# Patient Record
Sex: Female | Born: 1935 | Race: White | Hispanic: No | Marital: Single | Smoking: Former smoker
Health system: Southern US, Community
[De-identification: ages and names within clinical notes are randomized; demographics above are authoritative.]

## PROBLEM LIST (undated history)

## (undated) DIAGNOSIS — E7800 Pure hypercholesterolemia, unspecified: Secondary | ICD-10-CM

## (undated) DIAGNOSIS — I499 Cardiac arrhythmia, unspecified: Secondary | ICD-10-CM

## (undated) DIAGNOSIS — N189 Chronic kidney disease, unspecified: Secondary | ICD-10-CM

## (undated) DIAGNOSIS — M1990 Unspecified osteoarthritis, unspecified site: Secondary | ICD-10-CM

## (undated) DIAGNOSIS — I00 Rheumatic fever without heart involvement: Secondary | ICD-10-CM

## (undated) DIAGNOSIS — J449 Chronic obstructive pulmonary disease, unspecified: Secondary | ICD-10-CM

## (undated) DIAGNOSIS — D649 Anemia, unspecified: Secondary | ICD-10-CM

## (undated) DIAGNOSIS — I10 Essential (primary) hypertension: Secondary | ICD-10-CM

## (undated) DIAGNOSIS — Z972 Presence of dental prosthetic device (complete) (partial): Secondary | ICD-10-CM

## (undated) DIAGNOSIS — R0600 Dyspnea, unspecified: Secondary | ICD-10-CM

## (undated) DIAGNOSIS — R011 Cardiac murmur, unspecified: Secondary | ICD-10-CM

## (undated) DIAGNOSIS — F0390 Unspecified dementia without behavioral disturbance: Secondary | ICD-10-CM

## (undated) HISTORY — PX: ABDOMINAL HYSTERECTOMY: SHX81

## (undated) HISTORY — PX: APPENDECTOMY: SHX54

## (undated) HISTORY — PX: TONSILLECTOMY: SUR1361

---

## 2013-10-20 DIAGNOSIS — Z8739 Personal history of other diseases of the musculoskeletal system and connective tissue: Secondary | ICD-10-CM

## 2014-02-09 DIAGNOSIS — E7800 Pure hypercholesterolemia, unspecified: Secondary | ICD-10-CM | POA: Diagnosis present

## 2014-08-24 DIAGNOSIS — N1832 Chronic kidney disease, stage 3b: Secondary | ICD-10-CM

## 2015-01-04 DIAGNOSIS — I10 Essential (primary) hypertension: Secondary | ICD-10-CM | POA: Diagnosis present

## 2015-01-05 ENCOUNTER — Encounter: Payer: Self-pay | Admitting: *Deleted

## 2015-01-06 NOTE — Discharge Instructions (Signed)

## 2015-01-10 ENCOUNTER — Ambulatory Visit
Admission: RE | Admit: 2015-01-10 | Discharge: 2015-01-10 | Disposition: A | Discharge status: Home / Self Care | Payer: Medicare Other | Source: Ambulatory Visit | Attending: Ophthalmology | Admitting: Ophthalmology

## 2015-01-10 ENCOUNTER — Encounter: Payer: Self-pay | Admitting: Anesthesiology

## 2015-01-10 ENCOUNTER — Ambulatory Visit: Payer: Medicare Other | Admitting: Anesthesiology

## 2015-01-10 ENCOUNTER — Encounter
Admission: RE | Disposition: A | Discharge status: Home / Self Care | Payer: Self-pay | Source: Ambulatory Visit | Attending: Ophthalmology

## 2015-01-10 DIAGNOSIS — J449 Chronic obstructive pulmonary disease, unspecified: Secondary | ICD-10-CM | POA: Diagnosis not present

## 2015-01-10 DIAGNOSIS — D649 Anemia, unspecified: Secondary | ICD-10-CM | POA: Insufficient documentation

## 2015-01-10 DIAGNOSIS — H2512 Age-related nuclear cataract, left eye: Secondary | ICD-10-CM | POA: Insufficient documentation

## 2015-01-10 DIAGNOSIS — F17210 Nicotine dependence, cigarettes, uncomplicated: Secondary | ICD-10-CM | POA: Insufficient documentation

## 2015-01-10 DIAGNOSIS — I10 Essential (primary) hypertension: Secondary | ICD-10-CM | POA: Diagnosis not present

## 2015-01-10 HISTORY — DX: Pure hypercholesterolemia, unspecified: E78.00

## 2015-01-10 HISTORY — PX: CATARACT EXTRACTION W/PHACO: SHX586

## 2015-01-10 HISTORY — DX: Essential (primary) hypertension: I10

## 2015-01-10 HISTORY — DX: Chronic obstructive pulmonary disease, unspecified: J44.9

## 2015-01-10 HISTORY — DX: Anemia, unspecified: D64.9

## 2015-01-10 HISTORY — DX: Unspecified osteoarthritis, unspecified site: M19.90

## 2015-01-10 HISTORY — DX: Presence of dental prosthetic device (complete) (partial): Z97.2

## 2015-01-10 HISTORY — DX: Rheumatic fever without heart involvement: I00

## 2015-01-10 HISTORY — DX: Cardiac arrhythmia, unspecified: I49.9

## 2015-01-10 HISTORY — DX: Cardiac murmur, unspecified: R01.1

## 2015-01-10 SURGERY — PHACOEMULSIFICATION, CATARACT, WITH IOL INSERTION
Anesthesia: General | Laterality: Left | Wound class: Clean

## 2015-01-10 MED ORDER — EPINEPHRINE HCL 1 MG/ML IJ SOLN
INTRAOCULAR | Status: DC | PRN
  Administered 2015-01-10: 75 mL via OPHTHALMIC

## 2015-01-10 MED ORDER — FENTANYL CITRATE (PF) 100 MCG/2ML IJ SOLN
INTRAMUSCULAR | Status: DC | PRN
  Administered 2015-01-10: 50 ug via INTRAVENOUS

## 2015-01-10 MED ORDER — ARMC OPHTHALMIC DILATING GEL
1.0000 "application " | OPHTHALMIC | Status: DC | PRN
  Administered 2015-01-10 (×2): 1 via OPHTHALMIC

## 2015-01-10 MED ORDER — PROPARACAINE HCL 0.5 % OP SOLN
1.0000 [drp] | Freq: Once | OPHTHALMIC | Status: AC
  Administered 2015-01-10: 1 [drp] via OPHTHALMIC

## 2015-01-10 MED ORDER — LIDOCAINE HCL (PF) 4 % IJ SOLN
INTRAOCULAR | Status: DC | PRN
  Administered 2015-01-10: 1 mL via OPHTHALMIC

## 2015-01-10 MED ORDER — TIMOLOL MALEATE 0.5 % OP SOLN
OPHTHALMIC | Status: DC | PRN
  Administered 2015-01-10: 1 [drp] via OPHTHALMIC

## 2015-01-10 MED ORDER — MIDAZOLAM HCL 2 MG/2ML IJ SOLN
INTRAMUSCULAR | Status: DC | PRN
  Administered 2015-01-10 (×2): 1 mg via INTRAVENOUS

## 2015-01-10 MED ORDER — BRIMONIDINE TARTRATE 0.2 % OP SOLN
OPHTHALMIC | Status: DC | PRN
  Administered 2015-01-10: 1 [drp] via OPHTHALMIC

## 2015-01-10 MED ORDER — MOXIFLOXACIN HCL 0.5 % OP SOLN
OPHTHALMIC | Status: DC | PRN
  Administered 2015-01-10: .3 [drp] via OPHTHALMIC

## 2015-01-10 MED ORDER — POVIDONE-IODINE 5 % OP SOLN
1.0000 "application " | OPHTHALMIC | Status: DC | PRN
  Administered 2015-01-10: 1 via OPHTHALMIC

## 2015-01-10 MED ORDER — LACTATED RINGERS IV SOLN: INTRAVENOUS | Status: DC

## 2015-01-10 SURGICAL SUPPLY — 29 items
APPLICATOR COTTON TIP 3IN (MISCELLANEOUS) ×3 IMPLANT
CANNULA ANT/CHMB 27GA (MISCELLANEOUS) ×6 IMPLANT
DISSECTOR HYDRO NUCLEUS 50X22 (MISCELLANEOUS) ×3 IMPLANT
GLOVE BIO SURGEON STRL SZ7 (GLOVE) ×3 IMPLANT
GLOVE SURG LX 6.5 MICRO (GLOVE) ×2
GLOVE SURG LX STRL 6.5 MICRO (GLOVE) ×1 IMPLANT
GOWN STRL REUS W/ TWL LRG LVL3 (GOWN DISPOSABLE) ×2 IMPLANT
GOWN STRL REUS W/TWL LRG LVL3 (GOWN DISPOSABLE) ×4
LENS IOL ACRSF IQ PC 21.5 (Intraocular Lens) ×1 IMPLANT
LENS IOL ACRYSOF IQ POST 21.5 (Intraocular Lens) ×3 IMPLANT
MARKER SKIN SURG W/RULER VIO (MISCELLANEOUS) ×3 IMPLANT
NEEDLE FILTER BLUNT 18X 1/2SAF (NEEDLE) ×2
NEEDLE FILTER BLUNT 18X1 1/2 (NEEDLE) ×1 IMPLANT
PACK CATARACT BRASINGTON (MISCELLANEOUS) ×3 IMPLANT
PACK EYE AFTER SURG (MISCELLANEOUS) ×3 IMPLANT
PACK OPTHALMIC (MISCELLANEOUS) ×3 IMPLANT
RING MALYGIN 7.0 (MISCELLANEOUS) IMPLANT
SOL BAL SALT 15ML (MISCELLANEOUS)
SOLUTION BAL SALT 15ML (MISCELLANEOUS) IMPLANT
SUT ETHILON 10-0 CS-B-6CS-B-6 (SUTURE)
SUT VICRYL  9 0 (SUTURE)
SUT VICRYL 9 0 (SUTURE) IMPLANT
SUTURE EHLN 10-0 CS-B-6CS-B-6 (SUTURE) IMPLANT
SYR 3ML LL SCALE MARK (SYRINGE) ×3 IMPLANT
SYR TB 1ML LUER SLIP (SYRINGE) ×6 IMPLANT
WATER STERILE IRR 250ML POUR (IV SOLUTION) ×3 IMPLANT
WATER STERILE IRR 500ML POUR (IV SOLUTION) IMPLANT
WICK EYE OCUCEL (MISCELLANEOUS) IMPLANT
WIPE NON LINTING 3.25X3.25 (MISCELLANEOUS) ×3 IMPLANT

## 2015-01-10 NOTE — Transfer of Care (Signed)
Immediate Anesthesia Transfer of Care Note  Patient: Roberta Ross  Procedure(s) Performed: Procedure(s): CATARACT EXTRACTION PHACO AND INTRAOCULAR LENS PLACEMENT (IOC) (Left)  Patient Location: PACU  Anesthesia Type: General  Level of Consciousness: awake, alert  and patient cooperative  Airway and Oxygen Therapy: Patient Spontanous Breathing and Patient connected to supplemental oxygen  Post-op Assessment: Post-op Vital signs reviewed, Patient's Cardiovascular Status Stable, Respiratory Function Stable, Patent Airway and No signs of Nausea or vomiting  Post-op Vital Signs: Reviewed and stable  Complications: No apparent anesthesia complications

## 2015-01-10 NOTE — Op Note (Signed)
Date of Surgery: 01/10/2015  PREOPERATIVE DIAGNOSES: Visually significant nuclear sclerotic cataract, left eye.  POSTOPERATIVE DIAGNOSES: Same  PROCEDURES PERFORMED: Cataract extraction with intraocular lens implant, left eye.  SURGEON: Almon Hercules, M.D.  ANESTHESIA: MAC and topical  IMPLANTS:   Implant Name Type Inv. Item Serial No. Manufacturer Lot No. LRB No. Used  IMPLANT LENS - [id number] Intraocular Lens IMPLANT LENS [id number] ALCON   Left 1    COMPLICATIONS: None.  DESCRIPTION OF PROCEDURE: Therapeutic options were discussed with the patient preoperatively, including a discussion of risks and benefits of surgery. Informed consent was obtained. An IOL-Master and immersion biometry were used to take the lens measurements, and a dilated fundus exam was performed within 6 months of the surgical date.  The patient was premedicated and brought to the operating room and placed on the operating table in the supine position. After adequate anesthesia, the patient was prepped and draped in the usual sterile ophthalmic fashion. A wire lid speculum was inserted and the microscope was positioned. A Superblade was used to create a paracentesis site at the limbus and a small amount of dilute preservative free lidocaine was instilled into the anterior chamber, followed by dispersive viscoelastic. A clear corneal incision was created temporally using a 2.4 mm keratome blade. Capsulorrhexis was then performed. In situ phacoemulsification was performed.  Cortical material was removed with the irrigation-aspiration unit. Dispersive viscoelastic was instilled to open the capsular bag. A posterior chamber intraocular lens with the specifications above was inserted and positioned. Irrigation-aspiration was used to remove all viscoelastic. Cefuroxime 1cc was into the anterior chamber, and the corneal incision was checked and found to be water tight. The eyelid speculum was removed.  The operative eye was  covered with protective goggles after instilling 1 drop of timolol and brimonidine. The patient tolerated the procedure well. There were no complications.

## 2015-01-10 NOTE — Anesthesia Preprocedure Evaluation (Signed)
Anesthesia Evaluation  Patient identified by MRN, date of birth, ID band Patient awake    Reviewed: Allergy & Precautions, H&P , NPO status , Patient's Chart, lab work & pertinent test results, reviewed documented beta blocker date and time   Airway Mallampati: II  TM Distance: >3 FB Neck ROM: full    Dental no notable dental hx. (+) Upper Dentures   Pulmonary COPD, Current Smoker,    Pulmonary exam normal breath sounds clear to auscultation       Cardiovascular Exercise Tolerance: Good hypertension,  Rhythm:regular Rate:Normal     Neuro/Psych negative neurological ROS  negative psych ROS   GI/Hepatic negative GI ROS, Neg liver ROS,   Endo/Other  negative endocrine ROS  Renal/GU negative Renal ROS  negative genitourinary   Musculoskeletal   Abdominal   Peds  Hematology  (+) anemia ,   Anesthesia Other Findings   Reproductive/Obstetrics negative OB ROS                             Anesthesia Physical Anesthesia Plan  ASA: II  Anesthesia Plan: General   Post-op Pain Management:    Induction:   Airway Management Planned:   Additional Equipment:   Intra-op Plan:   Post-operative Plan:   Informed Consent: I have reviewed the patients History and Physical, chart, labs and discussed the procedure including the risks, benefits and alternatives for the proposed anesthesia with the patient or authorized representative who has indicated his/her understanding and acceptance.   Dental Advisory Given  Plan Discussed with: CRNA  Anesthesia Plan Comments:         Anesthesia Quick Evaluation

## 2015-01-10 NOTE — Anesthesia Postprocedure Evaluation (Signed)
  Anesthesia Post-op Note  Patient: Roberta Ross  Procedure(s) Performed: Procedure(s): CATARACT EXTRACTION PHACO AND INTRAOCULAR LENS PLACEMENT (IOC) (Left)  Anesthesia type:General  Patient location: PACU  Post pain: Pain level controlled  Post assessment: Post-op Vital signs reviewed, Patient's Cardiovascular Status Stable, Respiratory Function Stable, Patent Airway and No signs of Nausea or vomiting  Post vital signs: Reviewed and stable  Last Vitals:  Filed Vitals:   01/10/15 0930  BP: 133/88  Pulse: 75  Temp:   Resp: 18    Level of consciousness: awake, alert  and patient cooperative  Complications: No apparent anesthesia complications

## 2015-01-11 ENCOUNTER — Encounter: Payer: Self-pay | Admitting: Ophthalmology

## 2015-03-17 ENCOUNTER — Ambulatory Visit
Admission: RE | Admit: 2015-03-17 | Discharge: 2015-03-17 | Disposition: A | Discharge status: Home / Self Care | Payer: Medicare Other | Source: Ambulatory Visit | Attending: Internal Medicine | Admitting: Internal Medicine

## 2015-03-17 ENCOUNTER — Other Ambulatory Visit: Payer: Self-pay | Admitting: Internal Medicine

## 2015-03-17 DIAGNOSIS — M79661 Pain in right lower leg: Secondary | ICD-10-CM

## 2015-03-17 DIAGNOSIS — M7989 Other specified soft tissue disorders: Secondary | ICD-10-CM | POA: Diagnosis present

## 2015-04-05 ENCOUNTER — Other Ambulatory Visit: Payer: Self-pay | Admitting: Student

## 2015-04-05 DIAGNOSIS — M7989 Other specified soft tissue disorders: Secondary | ICD-10-CM

## 2015-05-10 ENCOUNTER — Ambulatory Visit: Admission: RE | Admit: 2015-05-10 | Payer: Medicare Other | Source: Ambulatory Visit

## 2015-05-30 ENCOUNTER — Ambulatory Visit
Admission: RE | Admit: 2015-05-30 | Discharge: 2015-05-30 | Disposition: A | Discharge status: Home / Self Care | Payer: Medicare Other | Source: Ambulatory Visit | Attending: Student | Admitting: Student

## 2015-05-30 DIAGNOSIS — R2241 Localized swelling, mass and lump, right lower limb: Secondary | ICD-10-CM | POA: Diagnosis present

## 2015-05-30 DIAGNOSIS — M25461 Effusion, right knee: Secondary | ICD-10-CM | POA: Insufficient documentation

## 2015-05-30 DIAGNOSIS — M7989 Other specified soft tissue disorders: Secondary | ICD-10-CM

## 2015-05-30 DIAGNOSIS — M7121 Synovial cyst of popliteal space [Baker], right knee: Secondary | ICD-10-CM | POA: Insufficient documentation

## 2015-05-30 DIAGNOSIS — M799 Soft tissue disorder, unspecified: Secondary | ICD-10-CM | POA: Diagnosis present

## 2015-05-30 LAB — POCT I-STAT CREATININE: CREATININE: 1 mg/dL (ref 0.44–1.00)

## 2015-05-30 MED ORDER — GADOBENATE DIMEGLUMINE 529 MG/ML IV SOLN
15.0000 mL | Freq: Once | INTRAVENOUS | Status: AC | PRN
  Administered 2015-05-30: 12 mL via INTRAVENOUS

## 2015-12-20 ENCOUNTER — Encounter: Payer: Self-pay | Admitting: Podiatry

## 2015-12-20 ENCOUNTER — Ambulatory Visit (INDEPENDENT_AMBULATORY_CARE_PROVIDER_SITE_OTHER): Payer: Medicare Other | Admitting: Podiatry

## 2015-12-20 ENCOUNTER — Ambulatory Visit (INDEPENDENT_AMBULATORY_CARE_PROVIDER_SITE_OTHER): Payer: Medicare Other

## 2015-12-20 VITALS — BP 124/77 | HR 66 | Temp 98.3°F | Resp 18

## 2015-12-20 DIAGNOSIS — R52 Pain, unspecified: Secondary | ICD-10-CM

## 2015-12-20 DIAGNOSIS — L02619 Cutaneous abscess of unspecified foot: Secondary | ICD-10-CM

## 2015-12-20 DIAGNOSIS — L89891 Pressure ulcer of other site, stage 1: Secondary | ICD-10-CM | POA: Diagnosis not present

## 2015-12-20 DIAGNOSIS — L03119 Cellulitis of unspecified part of limb: Secondary | ICD-10-CM | POA: Diagnosis not present

## 2015-12-20 DIAGNOSIS — L97511 Non-pressure chronic ulcer of other part of right foot limited to breakdown of skin: Secondary | ICD-10-CM

## 2015-12-20 MED ORDER — SILVER SULFADIAZINE 1 % EX CREA: 1.0000 "application " | TOPICAL_CREAM | Freq: Every day | CUTANEOUS | 0 refills | Status: DC

## 2015-12-20 MED ORDER — CLINDAMYCIN HCL 300 MG PO CAPS: 300.0000 mg | ORAL_CAPSULE | Freq: Three times a day (TID) | ORAL | 2 refills | Status: DC

## 2015-12-25 NOTE — Progress Notes (Signed)
Subjective:     Patient ID: Roberta Ross, female   DOB: January 09, 1934, 82 y.o.   MRN: [id number]  HPI 82 year old female presents the office today for concerns of her red toe becoming swollen and red and draining over the last 2 weeks. She has noticed the wound is formed at the end of the toe. She previously had an MRI done the right thigh/leg as well as an ultrasound to rule out blood clots in shoe so that she had a Baker cyst. She is unsure of how the wound to the right big toe started has been worsening over the last couple weeks. She said no treatment for this and she is really not taking antibiotics.  Review of Systems  All other systems reviewed and are negative.      Objective:   Physical Exam General: AAO x3, NAD  Dermatological:Ulceration present on the distal last of the right hallux and there is no probing, undermining or tunneling. There is the wound measuring 1 x 0.8 cm. Granular wound base. There is no probing, undermining or tunneling. There is edema and erythema to the distal aspect of the toe. There is no ascending cellulitis. This no fluctuance or crepitus and there is no malodor. There is no other open lesions identified at this time.  Vascular: Dorsalis Pedis artery and Posterior Tibial artery pedal pulses are 2/4 bilateral with immedate capillary fill time. There is no pain with calf compression, swelling, warmth, erythema.   Neruologic: Sensation decreased with Derrel Nip monofilament.  Musculoskeletal: Multiple digital deformities are present. There is no other areas of tenderness identified at this time.  Gait: Unassisted, Nonantalgic.      Assessment:     82 year old female right hallux cellulitis with ulceration    Plan:     -Treatment options discussed including all alternatives, risks, and complications -Etiology of symptoms were discussed -X-rays were obtained and reviewed with the patient. No definitive evidence of acute osteomyelitis this time. No  evidence of acute fracture. -Wound sharply debrided today without complications to granular wound base. Ordered Silvadene as well as clindamycin. Dispensed surgical shoe today. Monitor for signs or symptoms of worsening infection to the ER should any occur. Follow-up as scheduled or sooner if needed. Call any questions or concerns in the meantime.  Celesta Gentile, DPM

## 2015-12-30 ENCOUNTER — Telehealth: Payer: Self-pay | Admitting: *Deleted

## 2015-12-30 NOTE — Telephone Encounter (Signed)
Pt states she is taking Clindamycin and now has blood in her stool.  I asked pt if she had diarrhea and if the blood was in the stool. Pt states she has diarrhea, but there is not blood in the diarrhea, but is on the paper when she wipes. I asked pt if she had hemorrhoids and she states she got them when she was pregnant and they never went away. I informed Dr. Jacqualyn Posey and he stated is sounds like the diarrhea has irritated the hemorrhoids, stop the Clindamycin and if toe is red or has drainage then order Doxycycline.  Pt states understanding and the toe is not red and doesn't have drainage.  I told pt to continue soaks and antibiotic ointment when in shoes or walking around but when resting or going to bed may allow to dry. Pt states she has an appt 01/12/2016, I told her to call if she has concerns before then.

## 2016-01-12 ENCOUNTER — Ambulatory Visit (INDEPENDENT_AMBULATORY_CARE_PROVIDER_SITE_OTHER): Payer: Medicare Other | Admitting: Podiatry

## 2016-01-12 ENCOUNTER — Encounter: Payer: Self-pay | Admitting: Podiatry

## 2016-01-12 DIAGNOSIS — B351 Tinea unguium: Secondary | ICD-10-CM

## 2016-01-12 DIAGNOSIS — M79675 Pain in left toe(s): Secondary | ICD-10-CM

## 2016-01-12 DIAGNOSIS — L89891 Pressure ulcer of other site, stage 1: Secondary | ICD-10-CM

## 2016-01-12 DIAGNOSIS — M79674 Pain in right toe(s): Secondary | ICD-10-CM | POA: Diagnosis not present

## 2016-01-12 DIAGNOSIS — L97511 Non-pressure chronic ulcer of other part of right foot limited to breakdown of skin: Secondary | ICD-10-CM

## 2016-01-12 DIAGNOSIS — M2040 Other hammer toe(s) (acquired), unspecified foot: Secondary | ICD-10-CM

## 2016-01-12 DIAGNOSIS — M216X9 Other acquired deformities of unspecified foot: Secondary | ICD-10-CM

## 2016-01-13 NOTE — Progress Notes (Signed)
Subjective: 82 y.o. returns the office today for painful, elongated, thickened toenails which they cannot trim themselves as well as follow-up evaluation of ulceration of the right foot. She's been using the Silvadene cream to the wound daily she feels that the area has gotten much better. She denies any redness or swelling or any red streaks or any drainage. Denies any redness or drainage around the nails. Denies any acute changes since last appointment and no new complaints today. Denies any systemic complaints such as fevers, chills, nausea, vomiting.   Objective: AAO 3, NAD DP/PT pulses palpable, CRT less than 3 seconds Nails hypertrophic, dystrophic, elongated, brittle, discolored 10. There is tenderness overlying the nails 1-5 bilaterally. There is no surrounding erythema or drainage along the nail sites. The right hallux ulceration measuring approximately 0.5 x 0.5 x 0.1 cm it appears to be appropriate size. The wound has a granular wound base with a small hyperkeratotic periwound. There is no swelling erythema or ascending synovitis. There is no fluctuance or crepitus there is no malodor. There is no clinical signs of infection. No other open lesions or pre-ulcerative lesions are identified today. There is prominence of the metatarsal heads plantarly with atrophy of the fat pad. No other areas of tenderness bilateral lower extremities. No overlying edema, erythema, increased warmth. No pain with calf compression, swelling, warmth, erythema.  Assessment: Patient presents with symptomatic onychomycosis  Plan: -Treatment options including alternatives, risks, complications were discussed -Nails sharply debrided 10 without complication/bleeding. -Wound sharply debrided to granular tissue. Continue with Silvadene dressing changes daily. Areas not healed within 3 weeks follow-up or sooner if needed. Monitor for any signs or symptoms of infection. -Prescription for [employer] for extra-depth shoes  and custom orthotics provided. -Discussed daily foot inspection. If there are any changes, to call the office immediately.  -Follow-up in in 3 weeks if the wound is not healed or sooner if any problems are to arise. In the meantime, encouraged to call the office with any questions, concerns, changes symptoms.  Celesta Gentile, DPM

## 2016-02-01 DIAGNOSIS — F411 Generalized anxiety disorder: Secondary | ICD-10-CM | POA: Diagnosis present

## 2017-03-21 DIAGNOSIS — R7303 Prediabetes: Secondary | ICD-10-CM | POA: Diagnosis present

## 2017-06-21 ENCOUNTER — Other Ambulatory Visit: Payer: Self-pay | Admitting: Internal Medicine

## 2017-06-21 DIAGNOSIS — N183 Chronic kidney disease, stage 3 unspecified: Secondary | ICD-10-CM

## 2017-07-21 DIAGNOSIS — Z8679 Personal history of other diseases of the circulatory system: Secondary | ICD-10-CM

## 2017-09-16 DIAGNOSIS — I4891 Unspecified atrial fibrillation: Secondary | ICD-10-CM | POA: Diagnosis present

## 2017-09-16 DIAGNOSIS — I482 Chronic atrial fibrillation, unspecified: Secondary | ICD-10-CM | POA: Diagnosis present

## 2018-09-01 DIAGNOSIS — J410 Simple chronic bronchitis: Secondary | ICD-10-CM | POA: Insufficient documentation

## 2019-11-03 ENCOUNTER — Ambulatory Visit
Admission: RE | Admit: 2019-11-03 | Discharge: 2019-11-03 | Disposition: A | Discharge status: Home / Self Care | Payer: Medicare Other | Source: Ambulatory Visit | Attending: Physician Assistant | Admitting: Physician Assistant

## 2019-11-03 ENCOUNTER — Other Ambulatory Visit: Payer: Self-pay | Admitting: Physician Assistant

## 2019-11-03 ENCOUNTER — Other Ambulatory Visit: Payer: Self-pay

## 2019-11-03 DIAGNOSIS — R1011 Right upper quadrant pain: Secondary | ICD-10-CM

## 2020-03-27 ENCOUNTER — Emergency Department: Payer: Medicare Other

## 2020-03-27 ENCOUNTER — Inpatient Hospital Stay: Payer: Medicare Other

## 2020-03-27 ENCOUNTER — Inpatient Hospital Stay
Admission: EM | Admit: 2020-03-27 | Discharge: 2020-03-31 | DRG: 481 | Disposition: A | Discharge status: Skilled Nursing Facility | Payer: Medicare Other | Attending: Internal Medicine | Admitting: Internal Medicine

## 2020-03-27 ENCOUNTER — Other Ambulatory Visit: Payer: Self-pay

## 2020-03-27 DIAGNOSIS — D62 Acute posthemorrhagic anemia: Secondary | ICD-10-CM | POA: Diagnosis not present

## 2020-03-27 DIAGNOSIS — I10 Essential (primary) hypertension: Secondary | ICD-10-CM | POA: Diagnosis not present

## 2020-03-27 DIAGNOSIS — Z880 Allergy status to penicillin: Secondary | ICD-10-CM

## 2020-03-27 DIAGNOSIS — F411 Generalized anxiety disorder: Secondary | ICD-10-CM | POA: Diagnosis present

## 2020-03-27 DIAGNOSIS — F17210 Nicotine dependence, cigarettes, uncomplicated: Secondary | ICD-10-CM | POA: Diagnosis present

## 2020-03-27 DIAGNOSIS — Z8679 Personal history of other diseases of the circulatory system: Secondary | ICD-10-CM

## 2020-03-27 DIAGNOSIS — Z20822 Contact with and (suspected) exposure to covid-19: Secondary | ICD-10-CM | POA: Diagnosis present

## 2020-03-27 DIAGNOSIS — W010XXA Fall on same level from slipping, tripping and stumbling without subsequent striking against object, initial encounter: Secondary | ICD-10-CM | POA: Diagnosis present

## 2020-03-27 DIAGNOSIS — Y92019 Unspecified place in single-family (private) house as the place of occurrence of the external cause: Secondary | ICD-10-CM

## 2020-03-27 DIAGNOSIS — Z8739 Personal history of other diseases of the musculoskeletal system and connective tissue: Secondary | ICD-10-CM

## 2020-03-27 DIAGNOSIS — J449 Chronic obstructive pulmonary disease, unspecified: Secondary | ICD-10-CM

## 2020-03-27 DIAGNOSIS — Z888 Allergy status to other drugs, medicaments and biological substances status: Secondary | ICD-10-CM | POA: Diagnosis not present

## 2020-03-27 DIAGNOSIS — W19XXXA Unspecified fall, initial encounter: Secondary | ICD-10-CM | POA: Diagnosis not present

## 2020-03-27 DIAGNOSIS — S72002A Fracture of unspecified part of neck of left femur, initial encounter for closed fracture: Secondary | ICD-10-CM | POA: Diagnosis present

## 2020-03-27 DIAGNOSIS — M069 Rheumatoid arthritis, unspecified: Secondary | ICD-10-CM | POA: Diagnosis present

## 2020-03-27 DIAGNOSIS — Z01818 Encounter for other preprocedural examination: Secondary | ICD-10-CM

## 2020-03-27 DIAGNOSIS — E7800 Pure hypercholesterolemia, unspecified: Secondary | ICD-10-CM | POA: Diagnosis present

## 2020-03-27 DIAGNOSIS — F419 Anxiety disorder, unspecified: Secondary | ICD-10-CM | POA: Diagnosis present

## 2020-03-27 DIAGNOSIS — S72142A Displaced intertrochanteric fracture of left femur, initial encounter for closed fracture: Secondary | ICD-10-CM | POA: Diagnosis present

## 2020-03-27 DIAGNOSIS — N1832 Chronic kidney disease, stage 3b: Secondary | ICD-10-CM | POA: Diagnosis present

## 2020-03-27 DIAGNOSIS — I480 Paroxysmal atrial fibrillation: Secondary | ICD-10-CM | POA: Diagnosis present

## 2020-03-27 DIAGNOSIS — J4489 Other specified chronic obstructive pulmonary disease: Secondary | ICD-10-CM

## 2020-03-27 DIAGNOSIS — R7303 Prediabetes: Secondary | ICD-10-CM | POA: Diagnosis present

## 2020-03-27 DIAGNOSIS — Z79899 Other long term (current) drug therapy: Secondary | ICD-10-CM | POA: Diagnosis not present

## 2020-03-27 DIAGNOSIS — I4891 Unspecified atrial fibrillation: Secondary | ICD-10-CM | POA: Diagnosis present

## 2020-03-27 DIAGNOSIS — Z419 Encounter for procedure for purposes other than remedying health state, unspecified: Secondary | ICD-10-CM

## 2020-03-27 DIAGNOSIS — I482 Chronic atrial fibrillation, unspecified: Secondary | ICD-10-CM | POA: Diagnosis present

## 2020-03-27 DIAGNOSIS — Z01810 Encounter for preprocedural cardiovascular examination: Secondary | ICD-10-CM

## 2020-03-27 LAB — CBC WITH DIFFERENTIAL/PLATELET
Abs Immature Granulocytes: 0.02 10*3/uL (ref 0.00–0.07)
Basophils Absolute: 0 10*3/uL (ref 0.0–0.1)
Basophils Relative: 0 %
Eosinophils Absolute: 0.3 10*3/uL (ref 0.0–0.5)
Eosinophils Relative: 4 %
HCT: 38.8 % (ref 36.0–46.0)
Hemoglobin: 12.4 g/dL (ref 12.0–15.0)
Immature Granulocytes: 0 %
Lymphocytes Relative: 23 %
Lymphs Abs: 1.7 10*3/uL (ref 0.7–4.0)
MCH: 30.5 pg (ref 26.0–34.0)
MCHC: 32 g/dL (ref 30.0–36.0)
MCV: 95.6 fL (ref 80.0–100.0)
Monocytes Absolute: 0.6 10*3/uL (ref 0.1–1.0)
Monocytes Relative: 7 %
Neutro Abs: 4.9 10*3/uL (ref 1.7–7.7)
Neutrophils Relative %: 66 %
Platelets: 234 10*3/uL (ref 150–400)
RBC: 4.06 MIL/uL (ref 3.87–5.11)
RDW: 13.2 % (ref 11.5–15.5)
WBC: 7.5 10*3/uL (ref 4.0–10.5)
nRBC: 0 % (ref 0.0–0.2)

## 2020-03-27 LAB — TYPE AND SCREEN
ABO/RH(D): B POS
Antibody Screen: NEGATIVE

## 2020-03-27 LAB — COMPREHENSIVE METABOLIC PANEL
ALT: 8 U/L (ref 0–44)
AST: 16 U/L (ref 15–41)
Albumin: 3.2 g/dL — ABNORMAL LOW (ref 3.5–5.0)
Alkaline Phosphatase: 81 U/L (ref 38–126)
Anion gap: 11 (ref 5–15)
BUN: 18 mg/dL (ref 8–23)
CO2: 26 mmol/L (ref 22–32)
Calcium: 9.7 mg/dL (ref 8.9–10.3)
Chloride: 106 mmol/L (ref 98–111)
Creatinine, Ser: 1.37 mg/dL — ABNORMAL HIGH (ref 0.44–1.00)
GFR, Estimated: 38 mL/min — ABNORMAL LOW (ref >60)
Glucose, Bld: 140 mg/dL — ABNORMAL HIGH (ref 70–99)
Potassium: 3.7 mmol/L (ref 3.5–5.1)
Sodium: 143 mmol/L (ref 135–145)
Total Bilirubin: 0.6 mg/dL (ref 0.3–1.2)
Total Protein: 7.1 g/dL (ref 6.5–8.1)

## 2020-03-27 LAB — RESP PANEL BY RT-PCR (FLU A&B, COVID) ARPGX2
Influenza A by PCR: NEGATIVE
Influenza B by PCR: NEGATIVE
SARS Coronavirus 2 by RT PCR: NEGATIVE

## 2020-03-27 LAB — PROTIME-INR
INR: 1.1 (ref 0.8–1.2)
Prothrombin Time: 13.4 seconds (ref 11.4–15.2)

## 2020-03-27 MED ORDER — ZOLPIDEM TARTRATE 5 MG PO TABS
5.0000 mg | ORAL_TABLET | Freq: Every evening | ORAL | Status: DC | PRN
  Administered 2020-03-27: 5 mg via ORAL
  Filled 2020-03-27 (×3): qty 1

## 2020-03-27 MED ORDER — FENTANYL CITRATE (PF) 100 MCG/2ML IJ SOLN
50.0000 ug | INTRAMUSCULAR | Status: AC | PRN
  Administered 2020-03-27 (×2): 50 ug via INTRAVENOUS
  Filled 2020-03-27 (×2): qty 2

## 2020-03-27 MED ORDER — MORPHINE SULFATE (PF) 2 MG/ML IV SOLN: 0.5000 mg | INTRAVENOUS | Status: DC | PRN

## 2020-03-27 MED ORDER — DILTIAZEM HCL ER COATED BEADS 180 MG PO CP24
180.0000 mg | ORAL_CAPSULE | Freq: Every day | ORAL | Status: DC
  Administered 2020-03-28 – 2020-03-30 (×3): 180 mg via ORAL
  Filled 2020-03-27 (×5): qty 1

## 2020-03-27 MED ORDER — MORPHINE SULFATE (PF) 2 MG/ML IV SOLN: 2.0000 mg | INTRAVENOUS | Status: DC | PRN

## 2020-03-27 MED ORDER — CLINDAMYCIN PHOSPHATE 900 MG/50ML IV SOLN
900.0000 mg | INTRAVENOUS | Status: DC
  Administered 2020-03-28: 900 mg via INTRAVENOUS

## 2020-03-27 MED ORDER — HYDROCODONE-ACETAMINOPHEN 5-325 MG PO TABS
1.0000 | ORAL_TABLET | Freq: Four times a day (QID) | ORAL | Status: DC | PRN
  Administered 2020-03-27: 1 via ORAL
  Filled 2020-03-27: qty 1

## 2020-03-27 MED ORDER — MORPHINE SULFATE (PF) 4 MG/ML IV SOLN
4.0000 mg | Freq: Once | INTRAVENOUS | Status: AC
  Administered 2020-03-27: 4 mg via INTRAVENOUS
  Filled 2020-03-27: qty 1

## 2020-03-27 MED ORDER — MORPHINE SULFATE (PF) 4 MG/ML IV SOLN: 4.0000 mg | INTRAVENOUS | Status: DC | PRN

## 2020-03-27 MED ORDER — SODIUM CHLORIDE 0.9 % IV SOLN: INTRAVENOUS | Status: DC

## 2020-03-27 MED ORDER — HYDROMORPHONE HCL 1 MG/ML IJ SOLN
1.0000 mg | INTRAMUSCULAR | Status: DC | PRN
  Administered 2020-03-27 – 2020-03-28 (×3): 1 mg via INTRAVENOUS
  Filled 2020-03-27 (×4): qty 1

## 2020-03-27 NOTE — ED Notes (Signed)
Pt visitor requesting a call when pt has bed assignment.

## 2020-03-27 NOTE — ED Provider Notes (Signed)
[facility] Emergency Department Provider Note   ____________________________________________    I have reviewed the triage vital signs and the nursing notes.   HISTORY  Chief Complaint Hip Pain and Fall     HPI Roberta Ross is a 86 y.o. female who presents after a fall with complaints of left hip pain.  Patient describes that she got out of her chair to open a door lost her balance fell backwards and now has significant left hip pain.  I am able to bear weight on the left hip.  Denies other injuries.  No nausea or vomiting.  No significant head injury or neck pain.  Does have a history of atrial fibrillation but denies taking blood thinners  Past Medical History:  Diagnosis Date   Anemia    past hx   Arthritis    hands and feet   COPD (chronic obstructive pulmonary disease) (HCC)    Dysrhythmia    Pt reports slow HR and papatations   Heart murmur    past hx   Hypercholesteremia    Hypertension    Rheumatic fever    (x2) in past   Wears dentures    full upper    There are no problems to display for this patient.   Past Surgical History:  Procedure Laterality Date   ABDOMINAL HYSTERECTOMY     APPENDECTOMY     CATARACT EXTRACTION W/PHACO Left 01/10/2015   Procedure: CATARACT EXTRACTION PHACO AND INTRAOCULAR LENS PLACEMENT (IOC);  Surgeon: Ronnell Freshwater, MD;  Location: [place];  Service: Ophthalmology;  Laterality: Left;   TONSILLECTOMY      Prior to Admission medications   Medication Sig Start Date End Date Taking? Authorizing Provider  benazepril (LOTENSIN) 10 MG tablet Take 10 mg by mouth daily. AM    [provider]  clindamycin (CLEOCIN) 300 MG capsule Take 1 capsule (300 mg total) by mouth 3 (three) times daily. 12/20/15   Trula Slade, DPM  diclofenac (VOLTAREN) 75 MG EC tablet Take 75 mg by mouth 2 (two) times daily.    [provider]  silver sulfADIAZINE (SILVADENE)  1 % cream Apply 1 application topically daily. 12/20/15   Trula Slade, DPM  simvastatin (ZOCOR) 20 MG tablet Take by mouth. 06/29/15   [provider]     Allergies Penicillins and Methotrexate derivatives  No family history on file.  Social History Social History   Tobacco Use   Smoking status: Current Every Day Smoker    Packs/day: 0.25    Years: 60.00    Pack years: 15.00  Substance Use Topics   Alcohol use: No   Drug use: Not on file    Review of Systems  Constitutional: No dizziness Eyes: No visual changes.  ENT: No neck pain, no nasal injury Cardiovascular: Denies chest pain. Respiratory: Denies shortness of breath. Gastrointestinal: No abdominal pain.  No nausea, no vomiting.   Genitourinary: Negative for dysuria. Musculoskeletal: No back pain, left hip pain as above Skin: Negative for rash. Neurological: Negative for headaches or weakness   ____________________________________________   PHYSICAL EXAM:  VITAL SIGNS: ED Triage Vitals  Enc Vitals Group     BP 03/27/20 1653 117/77     Pulse Rate 03/27/20 1653 (!) 103     Resp 03/27/20 1653 (!) 39     Temp 03/27/20 1653 98.6 F (37 C)     Temp Source 03/27/20 1653 Oral     SpO2 03/27/20 1653 100 %  Weight 03/27/20 1723 59 kg (130 lb)     Height 03/27/20 1723 1.651 m (5\' 5" )     Head Circumference --      Peak Flow --      Pain Score 03/27/20 1649 10     Pain Loc --      Pain Edu? --      Excl. in [place]? --     Constitutional: Alert and oriented. Eyes: Conjunctivae are normal.  Head: Atraumatic.  No hematoma Nose: No congestion/rhinnorhea. Mouth/Throat: Mucous membranes are moist.   Neck:  Painless ROM, no pain with axial load,, no vertebral tenderness to palpation Cardiovascular: Normal rate, irregular rhythm. Grossly normal heart sounds.  Good peripheral circulation.  No chest wall tenderness palpation Respiratory: Normal respiratory effort.  No retractions. Gastrointestinal:  Soft and nontender. No distention.  No CVA tenderness.  Musculoskeletal: Left hip tenderness palpation, left leg shortened externally rotated. 2+distal pulses Neurologic:  Normal speech and language. No gross focal neurologic deficits are appreciated.  Skin:  Skin is warm, dry and intact. No rash noted. Psychiatric: Mood and affect are normal. Speech and behavior are normal.  ____________________________________________   LABS (all labs ordered are listed, but only abnormal results are displayed)  Labs Reviewed  COMPREHENSIVE METABOLIC PANEL - Abnormal; Notable for the following components:      Result Value   Glucose, Bld 140 (*)    Creatinine, Ser 1.37 (*)    Albumin 3.2 (*)    GFR, Estimated 38 (*)    All other components within normal limits  RESP PANEL BY RT-PCR (FLU A&B, COVID) ARPGX2  CBC WITH DIFFERENTIAL/PLATELET  PROTIME-INR  TYPE AND SCREEN   ____________________________________________  EKG  ED ECG REPORT I, Lavonia Drafts, the attending physician, personally viewed and interpreted this ECG.  Date: 03/27/2020  Rhythm: Atrial fibrillation QRS Axis: normal Intervals: Abnormal ST/T Wave abnormalities: normal Narrative Interpretation: no evidence of acute ischemia  ____________________________________________  RADIOLOGY  Hip x-ray reviewed by me, left intertrochanteric fracture ____________________________________________   PROCEDURES  Procedure(s) performed: No  Procedures   Critical Care performed: No ____________________________________________   INITIAL IMPRESSION / ASSESSMENT AND PLAN / ED COURSE  Pertinent labs & imaging results that were available during my care of the patient were reviewed by me and considered in my medical decision making (see chart for details).  Patient presents after mechanical fall as described above.  Exam is most concerning for left hip fracture, left leg is shortened and externally rotated, well perfused.  No  other injuries noted on exam.  Labs x-ray pending, patient treated with IV fentanyl  X-ray confirms left likely intertrochanteric fracture  Discussed with Dr. Roland Rack of orthopedics who plans to operate in the morning  Admitted to the hospitalist team    ____________________________________________   FINAL CLINICAL IMPRESSION(S) / ED DIAGNOSES  Final diagnoses:  Closed fracture of left hip, initial encounter [facility])        Note:  This document was prepared using Dragon voice recognition software and may include unintentional dictation errors.   Lavonia Drafts, MD 03/27/20 2006

## 2020-03-27 NOTE — ED Notes (Signed)
Pt hyperventilating, screaming in pain. Reached out to hospitalist.

## 2020-03-27 NOTE — H&P (Signed)
History and Physical    Roberta Ross PIR:[id number] DOB: 1933-10-06 DOA: 03/27/2020  PCP: Kirk Ruths, MD   Patient coming from: Home  I have personally briefly reviewed patient's old medical records in [place]  Chief Complaint: Fall, pain left hip  HPI: Roberta Ross is a 86 y.o. female with medical history significant for , HTN, prediabetes, anxiety, HTN, RA, CKD stage IIIb, atrial fibrillation not on anticoagulants and history of subarachnoid hemorrhage who presents to the emergency room following an accidental fall after she lost her balance while trying to get out of a chair falling backwards with immediate pain of the left hip and inability to bear weight.  She denies headache or neck pain.  She did not hit her head and did not lose consciousness.  She was previously in her usual state of health. ED Course: On arrival she was slightly tachycardic at 103, mild tachypnea with otherwise normal vitals.  CBC completely WNL, CMP significant for creatinine of 1.37 which is about her baseline for his CKD.  INR 1.1.  Left hip x-ray showing displaced transcervical neck versus intertrochanteric fracture of the left femur extending to the proximal femoral shaft. EKG as reviewed by me : A. fib with rate of 106 and no acute ST-T wave changes The ER provider spoke with orthopedist, Dr. Roland Rack recommends admission to the hospitalist service and plans to take to the OR in the a.m.  Review of Systems: As per HPI otherwise all other systems on review of systems negative.    Past Medical History:  Diagnosis Date  . Anemia    past hx  . Arthritis    hands and feet  . COPD (chronic obstructive pulmonary disease) ([place])   . Dysrhythmia    Pt reports slow HR and papatations  . Heart murmur    past hx  . Hypercholesteremia   . Hypertension   . Rheumatic fever    (x2) in past  . Wears dentures    full upper    Past Surgical History:  Procedure Laterality Date  . ABDOMINAL  HYSTERECTOMY    . APPENDECTOMY    . CATARACT EXTRACTION W/PHACO Left 01/10/2015   Procedure: CATARACT EXTRACTION PHACO AND INTRAOCULAR LENS PLACEMENT (IOC);  Surgeon: Ronnell Freshwater, MD;  Location: [facility];  Service: Ophthalmology;  Laterality: Left;  . TONSILLECTOMY       reports that she has been smoking. She has a 15.00 pack-year smoking history. She does not have any smokeless tobacco history on file. She reports that she does not drink alcohol. No history on file for drug use.  Allergies  Allergen Reactions  . Penicillins Other (See Comments)    Internal swelling including throat  . Methotrexate Derivatives Nausea Only    History reviewed. No pertinent family history.    Prior to Admission medications   Medication Sig Start Date End Date Taking? Authorizing Provider  benazepril (LOTENSIN) 10 MG tablet Take 10 mg by mouth daily. AM    [provider]  clindamycin (CLEOCIN) 300 MG capsule Take 1 capsule (300 mg total) by mouth 3 (three) times daily. 12/20/15   Trula Slade, DPM  diclofenac (VOLTAREN) 75 MG EC tablet Take 75 mg by mouth 2 (two) times daily.    [provider]  silver sulfADIAZINE (SILVADENE) 1 % cream Apply 1 application topically daily. 12/20/15   Trula Slade, DPM  simvastatin (ZOCOR) 20 MG tablet Take by mouth. 06/29/15   [provider]  Physical Exam: Vitals:   03/27/20 1915 03/27/20 1930 03/27/20 1945 03/27/20 2000  BP:  116/75  (!) 134/95  Pulse: 85 (!) 115 95 (!) 109  Resp: (!) 29 (!) 32 (!) 51 (!) 44  Temp:      TempSrc:      SpO2: 97% 98% 99% 94%  Weight:      Height:         Vitals:   03/27/20 1915 03/27/20 1930 03/27/20 1945 03/27/20 2000  BP:  116/75  (!) 134/95  Pulse: 85 (!) 115 95 (!) 109  Resp: (!) 29 (!) 32 (!) 51 (!) 44  Temp:      TempSrc:      SpO2: 97% 98% 99% 94%  Weight:      Height:          Constitutional: Alert and oriented x 3 .  In pain discomfort HEENT:       Head: Normocephalic and atraumatic.         Eyes: PERLA, EOMI, Conjunctivae are normal. Sclera is non-icteric.       Mouth/Throat: Mucous membranes are moist.       Neck: Supple with no signs of meningismus. Cardiovascular: Regular rate and rhythm. No murmurs, gallops, or rubs. 2+ symmetrical distal pulses are present . No JVD. No LE edema Respiratory: Respiratory effort normal .Lungs sounds clear bilaterally. No wheezes, crackles, or rhonchi.  Gastrointestinal: Soft, non tender, and non distended with positive bowel sounds. No rebound or guarding. Genitourinary: No CVA tenderness. Musculoskeletal:  Left leg shortening and external rotation.  Deformities fingers bilateral hands  neurologic:  Face is symmetric. Moving all extremities. No gross focal neurologic deficits . Skin: Skin is warm, dry.  No rash or ulcers Psychiatric: Mood and affect are normal    Labs on Admission: I have personally reviewed following labs and imaging studies  CBC: Recent Labs  Lab 03/27/20 1652  WBC 7.5  NEUTROABS 4.9  HGB 12.4  HCT 38.8  MCV 95.6  PLT 878   Basic Metabolic Panel: Recent Labs  Lab 03/27/20 1652  NA 143  K 3.7  CL 106  CO2 26  GLUCOSE 140*  BUN 18  CREATININE 1.37*  CALCIUM 9.7   GFR: Estimated Creatinine Clearance: 28 mL/min (A) (by C-G formula based on SCr of 1.37 mg/dL (H)). Liver Function Tests: Recent Labs  Lab 03/27/20 1652  AST 16  ALT 8  ALKPHOS 81  BILITOT 0.6  PROT 7.1  ALBUMIN 3.2*   No results for input(s): LIPASE, AMYLASE in the last 168 hours. No results for input(s): AMMONIA in the last 168 hours. Coagulation Profile: Recent Labs  Lab 03/27/20 1652  INR 1.1   Cardiac Enzymes: No results for input(s): CKTOTAL, CKMB, CKMBINDEX, TROPONINI in the last 168 hours. BNP (last 3 results) No results for input(s): PROBNP in the last 8760 hours. HbA1C: No results for input(s): HGBA1C in the last 72 hours. CBG: No results for input(s): GLUCAP in the  last 168 hours. Lipid Profile: No results for input(s): CHOL, HDL, LDLCALC, TRIG, CHOLHDL, LDLDIRECT in the last 72 hours. Thyroid Function Tests: No results for input(s): TSH, T4TOTAL, FREET4, T3FREE, THYROIDAB in the last 72 hours. Anemia Panel: No results for input(s): VITAMINB12, FOLATE, FERRITIN, TIBC, IRON, RETICCTPCT in the last 72 hours. Urine analysis: No results found for: COLORURINE, APPEARANCEUR, LABSPEC, [place], GLUCOSEU, HGBUR, BILIRUBINUR, KETONESUR, PROTEINUR, UROBILINOGEN, NITRITE, LEUKOCYTESUR  Radiological Exams on Admission: DG Hip Unilat With Pelvis 2-3 Views Left  Result Date:  03/27/2020 CLINICAL DATA:  Status post fall. EXAM: DG HIP (WITH OR WITHOUT PELVIS) 2-3V LEFT COMPARISON:  CT renal 11/03/2019. FINDINGS: Displaced transcervical neck versus intertrochanteric fracture of the left femur that extends to the proximal femoral shaft. No acute dislocation of the left hip. Frontal views of the right hip and pelvis are unremarkable. There is no evidence of severe arthropathy or aggressive appearing focal bone abnormality. Vascular calcifications. IMPRESSION: 1. Displaced transcervical neck versus intertrochanteric fracture of the left femur that extends to the proximal femoral shaft. 2. No dislocation of the left hip. 3. Frontal views of the pelvis and right hip are unremarkable. Electronically Signed   By: Iven Finn M.D.   On: 03/27/2020 18:18     Assessment/Plan 86 year old female with history of , HTN, prediabetes, anxiety, HTN, CKD stage IIIb,RA,  atrial fibrillation not on anticoagulants and history of subarachnoid hemorrhage who presenting with left hip fracture from a mechanical fall after she lost her balance while trying to get out of a chair     Closed fracture of femur, intertrochanteric, left, initial encounter ([place])   Accidental fall   Preoperative clearance -Accidental fall, without preceding lightheadedness or weakness, visual disturbance, one-sided  weakness numbness or tingling, palpitations or shortness of breath or chest pain -Patient at low risk for perioperative cardiopulmonary complications and can proceed with surgery -Further management per orthopedist. Dr. Roland Rack  Plans for the OR in the a.m. -Pain management -N.p.o. for surgery in the a.m.    A-fib ([place]) with mild RVR -EKG showing A. fib with rate of 106, likely related to pain from acute fracture -Patient is not on blood thinners -IV hydration -Patient is on diltiazem 180 mg daily    Stage 3b chronic kidney disease ([place]) -Renal function at baseline with creatinine of 1.37    Essential hypertension -Continue diltiazem 180 mg daily -Labetalol IV as needed while n.p.o.  Rheumatoid Arthritis with deformities --Chronic and stable -Not currently on any immunosuppressive medication    History of subarachnoid hemorrhage -No acute issues. -Patient did not hit her head.  No CT head and neck was indicated from the ER and based on physical exam    Prediabetes  -Blood sugar 140 -We will get an A1c    Pure hypercholesterolemia -Continue simvastatin     DVT prophylaxis: SCDs code Status: full code  Family Communication:  none  Disposition Plan: Back to previous home environment Consults called: Dr Roland Rack, orthopedics  Status:At the time of admission, it appears that the appropriate admission status for this patient is INPATIENT. This is judged to be reasonable and necessary in order to provide the required intensity of service to ensure the patient's safety given the presenting symptoms, physical exam findings, and initial radiographic and laboratory data in the context of their  Comorbid conditions.   Patient requires inpatient status due to high intensity of service, high risk for further deterioration and high frequency of surveillance required.   I certify that at the point of admission it is my clinical judgment that the patient will require inpatient hospital care  spanning beyond [place] MD [facility]     03/27/2020, 8:14 PM

## 2020-03-27 NOTE — ED Triage Notes (Addendum)
Pt BIB EMS from home after a mechanical fall. L leg noted for shortening and external rotation. Pt has hx of afib - not on blood thinners per EMS. Pt is AOx4. Pt received 100 mcg of Fentanyl from EMS

## 2020-03-28 ENCOUNTER — Encounter: Payer: Self-pay | Admitting: Internal Medicine

## 2020-03-28 ENCOUNTER — Other Ambulatory Visit: Payer: Self-pay

## 2020-03-28 ENCOUNTER — Encounter
Admission: EM | Disposition: A | Discharge status: Skilled Nursing Facility | Payer: Self-pay | Source: Home / Self Care | Attending: Family Medicine

## 2020-03-28 ENCOUNTER — Inpatient Hospital Stay: Payer: Medicare Other

## 2020-03-28 ENCOUNTER — Inpatient Hospital Stay: Payer: Medicare Other | Admitting: Anesthesiology

## 2020-03-28 HISTORY — PX: INTRAMEDULLARY (IM) NAIL INTERTROCHANTERIC: SHX5875

## 2020-03-28 LAB — CBC
HCT: 33.3 % — ABNORMAL LOW (ref 36.0–46.0)
Hemoglobin: 10.7 g/dL — ABNORMAL LOW (ref 12.0–15.0)
MCH: 30.4 pg (ref 26.0–34.0)
MCHC: 32.1 g/dL (ref 30.0–36.0)
MCV: 94.6 fL (ref 80.0–100.0)
Platelets: 191 10*3/uL (ref 150–400)
RBC: 3.52 MIL/uL — ABNORMAL LOW (ref 3.87–5.11)
RDW: 13.2 % (ref 11.5–15.5)
WBC: 6 10*3/uL (ref 4.0–10.5)
nRBC: 0 % (ref 0.0–0.2)

## 2020-03-28 LAB — SURGICAL PCR SCREEN
MRSA, PCR: NEGATIVE
Staphylococcus aureus: NEGATIVE

## 2020-03-28 LAB — HEMOGLOBIN A1C
Hgb A1c MFr Bld: 5.9 % — ABNORMAL HIGH (ref 4.8–5.6)
Mean Plasma Glucose: 122.63 mg/dL

## 2020-03-28 SURGERY — FIXATION, FRACTURE, INTERTROCHANTERIC, WITH INTRAMEDULLARY ROD
Anesthesia: General | Laterality: Left

## 2020-03-28 MED ORDER — DEXAMETHASONE SODIUM PHOSPHATE 10 MG/ML IJ SOLN
INTRAMUSCULAR | Status: AC
  Filled 2020-03-28: qty 1

## 2020-03-28 MED ORDER — CLINDAMYCIN PHOSPHATE 900 MG/50ML IV SOLN
INTRAVENOUS | Status: AC
  Filled 2020-03-28: qty 50

## 2020-03-28 MED ORDER — LORAZEPAM 2 MG/ML IJ SOLN: 1.0000 mg | Freq: Once | INTRAMUSCULAR | Status: DC | PRN

## 2020-03-28 MED ORDER — PHENYLEPHRINE HCL (PRESSORS) 10 MG/ML IV SOLN
INTRAVENOUS | Status: DC | PRN
  Administered 2020-03-28: 100 ug via INTRAVENOUS
  Administered 2020-03-28: 200 ug via INTRAVENOUS
  Administered 2020-03-28 (×2): 100 ug via INTRAVENOUS

## 2020-03-28 MED ORDER — DOCUSATE SODIUM 100 MG PO CAPS
100.0000 mg | ORAL_CAPSULE | Freq: Two times a day (BID) | ORAL | Status: DC
  Administered 2020-03-28 – 2020-03-31 (×6): 100 mg via ORAL
  Filled 2020-03-28 (×6): qty 1

## 2020-03-28 MED ORDER — LIDOCAINE HCL (CARDIAC) PF 100 MG/5ML IV SOSY
PREFILLED_SYRINGE | INTRAVENOUS | Status: DC | PRN
  Administered 2020-03-28: 60 mg via INTRAVENOUS

## 2020-03-28 MED ORDER — ROCURONIUM BROMIDE 100 MG/10ML IV SOLN
INTRAVENOUS | Status: DC | PRN
  Administered 2020-03-28: 30 mg via INTRAVENOUS

## 2020-03-28 MED ORDER — BUPIVACAINE-EPINEPHRINE (PF) 0.5% -1:200000 IJ SOLN
INTRAMUSCULAR | Status: DC | PRN
  Administered 2020-03-28: 50 mL

## 2020-03-28 MED ORDER — EPHEDRINE SULFATE 50 MG/ML IJ SOLN
5.0000 mg | INTRAMUSCULAR | Status: AC | PRN
  Administered 2020-03-28: 5 mg via INTRAVENOUS

## 2020-03-28 MED ORDER — METHOCARBAMOL 500 MG PO TABS
500.0000 mg | ORAL_TABLET | Freq: Four times a day (QID) | ORAL | Status: DC | PRN
  Administered 2020-03-29: 500 mg via ORAL
  Filled 2020-03-28 (×2): qty 1

## 2020-03-28 MED ORDER — PROPOFOL 10 MG/ML IV BOLUS
INTRAVENOUS | Status: DC | PRN
  Administered 2020-03-28: 100 mg via INTRAVENOUS

## 2020-03-28 MED ORDER — OXYCODONE HCL 5 MG PO TABS: 5.0000 mg | ORAL_TABLET | Freq: Once | ORAL | Status: DC | PRN

## 2020-03-28 MED ORDER — ACETAMINOPHEN 10 MG/ML IV SOLN
INTRAVENOUS | Status: DC | PRN
  Administered 2020-03-28: 1000 mg via INTRAVENOUS

## 2020-03-28 MED ORDER — ONDANSETRON HCL 4 MG/2ML IJ SOLN: 4.0000 mg | Freq: Four times a day (QID) | INTRAMUSCULAR | Status: DC | PRN

## 2020-03-28 MED ORDER — HYDROMORPHONE HCL 1 MG/ML IJ SOLN: 0.2500 mg | INTRAMUSCULAR | Status: DC | PRN

## 2020-03-28 MED ORDER — FENTANYL CITRATE (PF) 100 MCG/2ML IJ SOLN
INTRAMUSCULAR | Status: AC
  Filled 2020-03-28: qty 2

## 2020-03-28 MED ORDER — BISACODYL 10 MG RE SUPP: 10.0000 mg | Freq: Every day | RECTAL | Status: DC | PRN

## 2020-03-28 MED ORDER — MUPIROCIN 2 % EX OINT
1.0000 "application " | TOPICAL_OINTMENT | Freq: Two times a day (BID) | CUTANEOUS | Status: DC
  Administered 2020-03-28 – 2020-03-31 (×6): 1 via NASAL
  Filled 2020-03-28: qty 22

## 2020-03-28 MED ORDER — SUGAMMADEX SODIUM 200 MG/2ML IV SOLN
INTRAVENOUS | Status: DC | PRN
  Administered 2020-03-28: 130 mg via INTRAVENOUS

## 2020-03-28 MED ORDER — EPHEDRINE SULFATE 50 MG/ML IJ SOLN
INTRAMUSCULAR | Status: AC
  Administered 2020-03-28: 5 mg via INTRAVENOUS
  Filled 2020-03-28: qty 1

## 2020-03-28 MED ORDER — OXYCODONE HCL 5 MG PO TABS
2.5000 mg | ORAL_TABLET | ORAL | Status: DC | PRN
  Administered 2020-03-31: 2.5 mg via ORAL
  Filled 2020-03-28 (×2): qty 1

## 2020-03-28 MED ORDER — ACETAMINOPHEN 10 MG/ML IV SOLN
INTRAVENOUS | Status: AC
  Filled 2020-03-28: qty 100

## 2020-03-28 MED ORDER — FENTANYL CITRATE (PF) 100 MCG/2ML IJ SOLN
INTRAMUSCULAR | Status: DC | PRN
  Administered 2020-03-28: 50 ug via INTRAVENOUS

## 2020-03-28 MED ORDER — PROPOFOL 10 MG/ML IV BOLUS
INTRAVENOUS | Status: AC
  Filled 2020-03-28: qty 20

## 2020-03-28 MED ORDER — METOCLOPRAMIDE HCL 5 MG/ML IJ SOLN: 5.0000 mg | Freq: Three times a day (TID) | INTRAMUSCULAR | Status: DC | PRN

## 2020-03-28 MED ORDER — ACETAMINOPHEN 500 MG PO TABS
1000.0000 mg | ORAL_TABLET | Freq: Three times a day (TID) | ORAL | Status: AC
  Administered 2020-03-28 – 2020-03-29 (×4): 1000 mg via ORAL
  Filled 2020-03-28 (×4): qty 2

## 2020-03-28 MED ORDER — PROMETHAZINE HCL 25 MG/ML IJ SOLN: 6.2500 mg | INTRAMUSCULAR | Status: DC | PRN

## 2020-03-28 MED ORDER — ENOXAPARIN SODIUM 30 MG/0.3ML ~~LOC~~ SOLN
30.0000 mg | SUBCUTANEOUS | Status: DC
  Filled 2020-03-28: qty 0.3

## 2020-03-28 MED ORDER — ONDANSETRON HCL 4 MG/2ML IJ SOLN
INTRAMUSCULAR | Status: DC | PRN
  Administered 2020-03-28: 4 mg via INTRAVENOUS

## 2020-03-28 MED ORDER — BUPIVACAINE LIPOSOME 1.3 % IJ SUSP
INTRAMUSCULAR | Status: AC
  Filled 2020-03-28: qty 20

## 2020-03-28 MED ORDER — SODIUM CHLORIDE 0.9 % IV SOLN: INTRAVENOUS | Status: DC

## 2020-03-28 MED ORDER — METHOCARBAMOL 1000 MG/10ML IJ SOLN
500.0000 mg | Freq: Four times a day (QID) | INTRAVENOUS | Status: DC | PRN
  Filled 2020-03-28: qty 5

## 2020-03-28 MED ORDER — DEXAMETHASONE SODIUM PHOSPHATE 10 MG/ML IJ SOLN
INTRAMUSCULAR | Status: DC | PRN
  Administered 2020-03-28: 10 mg via INTRAVENOUS

## 2020-03-28 MED ORDER — SENNOSIDES-DOCUSATE SODIUM 8.6-50 MG PO TABS: 1.0000 | ORAL_TABLET | Freq: Every evening | ORAL | Status: DC | PRN

## 2020-03-28 MED ORDER — OXYCODONE HCL 5 MG/5ML PO SOLN: 5.0000 mg | Freq: Once | ORAL | Status: DC | PRN

## 2020-03-28 MED ORDER — SODIUM CHLORIDE 0.9 % IV SOLN
INTRAVENOUS | Status: DC | PRN
  Administered 2020-03-28: 30 ug/min via INTRAVENOUS

## 2020-03-28 MED ORDER — SODIUM CHLORIDE FLUSH 0.9 % IV SOLN
INTRAVENOUS | Status: AC
  Filled 2020-03-28: qty 10

## 2020-03-28 MED ORDER — METOCLOPRAMIDE HCL 10 MG PO TABS: 5.0000 mg | ORAL_TABLET | Freq: Three times a day (TID) | ORAL | Status: DC | PRN

## 2020-03-28 MED ORDER — MEPERIDINE HCL 50 MG/ML IJ SOLN
6.2500 mg | INTRAMUSCULAR | Status: DC | PRN
  Administered 2020-03-28: 12.5 mg via INTRAVENOUS

## 2020-03-28 MED ORDER — DROPERIDOL 2.5 MG/ML IJ SOLN
0.6250 mg | Freq: Once | INTRAMUSCULAR | Status: DC | PRN
  Filled 2020-03-28: qty 2

## 2020-03-28 MED ORDER — CLINDAMYCIN PHOSPHATE 600 MG/50ML IV SOLN
600.0000 mg | Freq: Four times a day (QID) | INTRAVENOUS | Status: AC
  Administered 2020-03-28 – 2020-03-29 (×3): 600 mg via INTRAVENOUS
  Filled 2020-03-28 (×3): qty 50

## 2020-03-28 MED ORDER — ONDANSETRON HCL 4 MG PO TABS: 4.0000 mg | ORAL_TABLET | Freq: Four times a day (QID) | ORAL | Status: DC | PRN

## 2020-03-28 MED ORDER — FLEET ENEMA 7-19 GM/118ML RE ENEM: 1.0000 | ENEMA | Freq: Once | RECTAL | Status: DC | PRN

## 2020-03-28 MED ORDER — TRAMADOL HCL 50 MG PO TABS
50.0000 mg | ORAL_TABLET | Freq: Four times a day (QID) | ORAL | Status: DC | PRN
  Filled 2020-03-28: qty 1

## 2020-03-28 MED ORDER — METHOCARBAMOL 500 MG PO TABS
500.0000 mg | ORAL_TABLET | Freq: Three times a day (TID) | ORAL | Status: DC | PRN
  Administered 2020-03-28: 500 mg via ORAL
  Filled 2020-03-28 (×2): qty 1

## 2020-03-28 MED ORDER — BUPIVACAINE-EPINEPHRINE (PF) 0.5% -1:200000 IJ SOLN
INTRAMUSCULAR | Status: AC
  Filled 2020-03-28: qty 30

## 2020-03-28 MED ORDER — MEPERIDINE HCL 50 MG/ML IJ SOLN
INTRAMUSCULAR | Status: AC
  Filled 2020-03-28: qty 1

## 2020-03-28 MED ORDER — ONDANSETRON HCL 4 MG/2ML IJ SOLN
INTRAMUSCULAR | Status: AC
  Filled 2020-03-28: qty 2

## 2020-03-28 MED ORDER — SODIUM CHLORIDE 0.9 % IV BOLUS: 250.0000 mL | Freq: Once | INTRAVENOUS | Status: DC

## 2020-03-28 MED ORDER — ROCURONIUM BROMIDE 10 MG/ML (PF) SYRINGE
PREFILLED_SYRINGE | INTRAVENOUS | Status: AC
  Filled 2020-03-28: qty 10

## 2020-03-28 MED ORDER — OXYCODONE HCL 5 MG PO TABS
5.0000 mg | ORAL_TABLET | ORAL | Status: DC | PRN
  Administered 2020-03-29 – 2020-03-30 (×3): 5 mg via ORAL
  Filled 2020-03-28 (×3): qty 1

## 2020-03-28 SURGICAL SUPPLY — 43 items
BIT DRILL LONG 4.0 (BIT) IMPLANT
BNDG COHESIVE 4X5 TAN STRL (GAUZE/BANDAGES/DRESSINGS) ×2 IMPLANT
BNDG COHESIVE 6X5 TAN STRL LF (GAUZE/BANDAGES/DRESSINGS) ×2 IMPLANT
CHLORAPREP W/TINT 26 (MISCELLANEOUS) ×4 IMPLANT
COVER WAND RF STERILE (DRAPES) ×2 IMPLANT
DRAPE C-ARMOR (DRAPES) ×2 IMPLANT
DRILL BIT LONG 4.0 (BIT) ×2
DRSG OPSITE POSTOP 3X4 (GAUZE/BANDAGES/DRESSINGS) ×1 IMPLANT
DRSG OPSITE POSTOP 4X6 (GAUZE/BANDAGES/DRESSINGS) ×1 IMPLANT
ELECT CAUTERY BLADE 6.4 (BLADE) ×2 IMPLANT
ELECT REM PT RETURN 9FT ADLT (ELECTROSURGICAL) ×2
ELECTRODE REM PT RTRN 9FT ADLT (ELECTROSURGICAL) ×1 IMPLANT
GAUZE SPONGE 4X4 12PLY STRL (GAUZE/BANDAGES/DRESSINGS) ×2 IMPLANT
GLOVE BIO SURGEON STRL SZ8 (GLOVE) ×4 IMPLANT
GLOVE INDICATOR 8.0 STRL GRN (GLOVE) ×2 IMPLANT
GOWN STRL REUS W/ TWL LRG LVL3 (GOWN DISPOSABLE) ×1 IMPLANT
GOWN STRL REUS W/ TWL XL LVL3 (GOWN DISPOSABLE) ×1 IMPLANT
GOWN STRL REUS W/TWL LRG LVL3 (GOWN DISPOSABLE) ×1
GOWN STRL REUS W/TWL XL LVL3 (GOWN DISPOSABLE) ×1
GUIDE PIN 3.2X343 (PIN) ×2
GUIDE PIN 3.2X343MM (PIN) ×2
GUIDE ROD 3.0 (MISCELLANEOUS) ×2
MANIFOLD NEPTUNE II (INSTRUMENTS) ×2 IMPLANT
MAT ABSORB  FLUID 56X50 GRAY (MISCELLANEOUS) ×1
MAT ABSORB FLUID 56X50 GRAY (MISCELLANEOUS) ×1 IMPLANT
NAIL TRIGEN 10MMX20CM HIP ×1 IMPLANT
NDL FILTER BLUNT 18X1 1/2 (NEEDLE) ×1 IMPLANT
NEEDLE FILTER BLUNT 18X 1/2SAF (NEEDLE) ×1
NEEDLE FILTER BLUNT 18X1 1/2 (NEEDLE) ×1 IMPLANT
NEEDLE HYPO 22GX1.5 SAFETY (NEEDLE) ×2 IMPLANT
NS IRRIG 500ML POUR BTL (IV SOLUTION) ×2 IMPLANT
PACK HIP COMPR (MISCELLANEOUS) ×2 IMPLANT
PIN GUIDE 3.2X343MM (PIN) IMPLANT
ROD GUIDE 3.0 (MISCELLANEOUS) IMPLANT
SCREW LAG COMPR KIT 95/90 (Screw) ×1 IMPLANT
SCREW TRIGEN LOW PROF 5.0X30 (Screw) ×1 IMPLANT
STAPLER SKIN PROX 35W (STAPLE) ×2 IMPLANT
STRAP SAFETY 5IN WIDE (MISCELLANEOUS) ×2 IMPLANT
SUT VIC AB 0 CT1 36 (SUTURE) ×2 IMPLANT
SUT VIC AB 1 CT1 36 (SUTURE) ×2 IMPLANT
SUT VIC AB 2-0 CT1 (SUTURE) ×4 IMPLANT
SYR 30ML LL (SYRINGE) ×2 IMPLANT
TAPE MICROFOAM 4IN (TAPE) ×2 IMPLANT

## 2020-03-28 NOTE — Consult Note (Addendum)
ORTHOPAEDIC CONSULTATION  REQUESTING PHYSICIAN: Darliss Cheney, MD  Of note, I will be taking over orthopedic care of this patient from Dr. Roland Rack.   Chief Complaint:   L hip pain  History of Present Illness: Roberta Ross is a 86 y.o. female with multiple medical problems including hypertension, atrial fibrillation, hypercholesterolemia, COPD, chronic kidney disease, and a history of severe degenerative joint disease primarily involving her hands and feet. She had a fall yesterday while trying to get out of her chair. The patient noted immediate hip pain and inability to ambulate.  The patient ambulates unassisted at baseline. She lives independently.  Pain is described as sharp at its worst and a dull ache at its best.  Pain is rated a 10 out of 10 in severity.  Pain is improved with rest and immobilization.  Pain is worse with any sort of movement.  X-rays in the emergency department show a left intertrochanteric hip fracture.  Past Medical History:  Diagnosis Date  . Anemia    past hx  . Arthritis    hands and feet  . COPD (chronic obstructive pulmonary disease) ([place])   . Dysrhythmia    Pt reports slow HR and papatations  . Heart murmur    past hx  . Hypercholesteremia   . Hypertension   . Rheumatic fever    (x2) in past  . Wears dentures    full upper   Past Surgical History:  Procedure Laterality Date  . ABDOMINAL HYSTERECTOMY    . APPENDECTOMY    . CATARACT EXTRACTION W/PHACO Left 01/10/2015   Procedure: CATARACT EXTRACTION PHACO AND INTRAOCULAR LENS PLACEMENT (IOC);  Surgeon: Ronnell Freshwater, MD;  Location: [facility];  Service: Ophthalmology;  Laterality: Left;  . TONSILLECTOMY     Social History   Socioeconomic History  . Marital status: Single    Spouse name: Not on file  . Number of children: Not on file  . Years of education: Not on file  . Highest education level: Not on file   Occupational History  . Not on file  Tobacco Use  . Smoking status: Current Every Day Smoker    Packs/day: 0.25    Years: 60.00    Pack years: 15.00  . Smokeless tobacco: Never Used  Substance and Sexual Activity  . Alcohol use: No  . Drug use: Not on file  . Sexual activity: Not on file  Other Topics Concern  . Not on file  Social History Narrative  . Not on file   Social Determinants of Health   Financial Resource Strain:   . Difficulty of Paying Living Expenses: Not on file  Food Insecurity:   . Worried About Charity fundraiser in the Last Year: Not on file  . Ran Out of Food in the Last Year: Not on file  Transportation Needs:   . Lack of Transportation (Medical): Not on file  . Lack of Transportation (Non-Medical): Not on file  Physical Activity:   . Days of Exercise per Week: Not on file  . Minutes of Exercise per Session: Not on file  Stress:   . Feeling of Stress : Not on file  Social Connections:   . Frequency of Communication with Friends and Family: Not on file  . Frequency of Social Gatherings with Friends and Family: Not on file  . Attends Religious Services: Not on file  . Active Member of Clubs or Organizations: Not on file  . Attends Archivist Meetings: Not on  file  . Marital Status: Not on file   History reviewed. No pertinent family history. Allergies  Allergen Reactions  . Penicillins Other (See Comments)    Internal swelling including throat  . Methotrexate Derivatives Nausea Only   Prior to Admission medications   Medication Sig Start Date End Date Taking? Authorizing Provider  acetaminophen (ACETAMINOPHEN 8 HOUR) 650 MG CR tablet Take 650 mg by mouth 2 (two) times daily.   Yes [provider]  diltiazem (CARDIZEM) 120 MG tablet Take 120 mg by mouth 1 day or 1 dose.   Yes [provider]  furosemide (LASIX) 20 MG tablet Take 20 mg by mouth daily.   Yes [provider]  simvastatin (ZOCOR) 20 MG tablet  Take by mouth. 06/29/15  Yes [provider]   Recent Labs    03/27/20 1652 03/28/20 0438  WBC 7.5 6.0  HGB 12.4 10.7*  HCT 38.8 33.3*  PLT 234 191  K 3.7  --   CL 106  --   CO2 26  --   BUN 18  --   CREATININE 1.37*  --   GLUCOSE 140*  --   CALCIUM 9.7  --   INR 1.1  --    Chest Portable 1 View  Result Date: 03/27/2020 CLINICAL DATA:  Preoperative evaluation. EXAM: PORTABLE CHEST 1 VIEW COMPARISON:  None. FINDINGS: Mild, diffuse, chronic appearing increased lung markings are seen. There is no evidence of acute infiltrate, pleural effusion or pneumothorax. The cardiac silhouette is mildly enlarged. There is moderate severity calcification of the aortic arch. Radiopaque surgical clips are seen within the right upper quadrant. The visualized skeletal structures are unremarkable. IMPRESSION: No evidence of acute or active cardiopulmonary disease. Electronically Signed   By: Virgina Norfolk M.D.   On: 03/27/2020 20:39   DG Hip Unilat With Pelvis 2-3 Views Left  Result Date: 03/27/2020 CLINICAL DATA:  Status post fall. EXAM: DG HIP (WITH OR WITHOUT PELVIS) 2-3V LEFT COMPARISON:  CT renal 11/03/2019. FINDINGS: Displaced transcervical neck versus intertrochanteric fracture of the left femur that extends to the proximal femoral shaft. No acute dislocation of the left hip. Frontal views of the right hip and pelvis are unremarkable. There is no evidence of severe arthropathy or aggressive appearing focal bone abnormality. Vascular calcifications. IMPRESSION: 1. Displaced transcervical neck versus intertrochanteric fracture of the left femur that extends to the proximal femoral shaft. 2. No dislocation of the left hip. 3. Frontal views of the pelvis and right hip are unremarkable. Electronically Signed   By: Iven Finn M.D.   On: 03/27/2020 18:18     Positive ROS: All other systems have been reviewed and were otherwise negative with the exception of those mentioned in the HPI and as  above.  Physical Exam: BP 111/76   Pulse 83   Temp 98.2 F (36.8 C) (Oral)   Resp 14   Ht 5\' 5"  (1.651 m)   Wt 59 kg   SpO2 92%   BMI 21.64 kg/m  General:  Alert, no acute distress Psychiatric:  Patient is competent for consent with normal mood and affect   Cardiovascular:  No pedal edema, regular rate and rhythm Respiratory:  No wheezing, non-labored breathing GI:  Abdomen is soft and non-tender Skin:  No lesions in the area of chief complaint, no erythema Neurologic:  Sensation intact distally, CN grossly intact Lymphatic:  No axillary or cervical lymphadenopathy  Orthopedic Exam:  LLE: + DF/PF/EHL SILT grossly over foot Foot wwp +Log roll/axial  load   X-rays:  As above: L intertrochanteric hip fracture  Assessment/Plan: Roberta Ross is a 86 y.o. female with a L intertrochanteric hip fracture   1. I discussed the various treatment options including both surgical and non-surgical management of her fracture with the patient and her daughter. We discussed the high risk of perioperative complications due to patient's age and other medical co-morbidities. After discussion of risks, benefits, and alternatives to surgery, the family and patient were in agreement to proceed with surgery. The goals of surgery would be to provide adequate pain relief and allow for mobilization. Plan for surgery is L hip cephalomedullary nailing today, 03/28/2020. 2. NPO until OR 3. Hold anticoagulation in advance of OR 4. Of note, I will be taking over orthopedic care of this patient from Dr. Roland Rack.          Leim Fabry   03/28/2020 1:08 PM

## 2020-03-28 NOTE — ED Notes (Signed)
Pt's daughter called - given update via phone

## 2020-03-28 NOTE — ED Notes (Addendum)
Verbal report given to Pre-Op, plans for 1300 surgery. Consent is already made out and traveling with pt

## 2020-03-28 NOTE — Progress Notes (Signed)
Pt BP 90's/60's. Dr. Andree Elk notified. Acknowledged. Stated pt could go ahead and go to her room.

## 2020-03-28 NOTE — ED Notes (Signed)
Report called to [place], 1A

## 2020-03-28 NOTE — Progress Notes (Signed)
PROGRESS NOTE    Roberta Ross  PXT:[id number] DOB: 1934/02/12 DOA: 03/27/2020 PCP: Kirk Ruths, MD   Brief Narrative:  HPI: Roberta Ross is a 86 y.o. female with medical history significant for , HTN, prediabetes, anxiety, HTN, RA, CKD stage IIIb, atrial fibrillation not on anticoagulants and history of subarachnoid hemorrhage who presents to the emergency room following an accidental fall after she lost her balance while trying to get out of a chair falling backwards with immediate pain of the left hip and inability to bear weight.  She denies headache or neck pain.  She did not hit her head and did not lose consciousness.  She was previously in her usual state of health. ED Course: On arrival she was slightly tachycardic at 103, mild tachypnea with otherwise normal vitals.  CBC completely WNL, CMP significant for creatinine of 1.37 which is about her baseline for his CKD.  INR 1.1.  Left hip x-ray showing displaced transcervical neck versus intertrochanteric fracture of the left femur extending to the proximal femoral shaft. EKG as reviewed by me : A. fib with rate of 106 and no acute ST-T wave changes The ER provider spoke with orthopedist, Dr. Roland Rack recommends admission to the hospitalist service and plans to take to the OR in the a.m.  Assessment & Plan:   Principal Problem:   Closed fracture of femur, intertrochanteric, left, initial encounter ([place]) Active Problems:   A-fib (HCC)   Stage 3b chronic kidney disease ([place])   Essential hypertension   GAD (generalized anxiety disorder)   H/O rheumatoid arthritis   History of subarachnoid hemorrhage   Prediabetes   Pure hypercholesterolemia   COPD with chronic bronchitis (HCC)   Accidental fall   Preoperative clearance   Closed left hip fracture, initial encounter (HCC)     Closed fracture of femur, intertrochanteric, left, initial encounter ([place]) due to accidental fall: Orthopedics on board.  Plan for surgery today.   Continue current pain management.    A-fib (HCC) with mild RVR: Does not seem to be in RVR anymore.  Asymptomatic.  Not on any anticoagulation due to previous history of brain bleed.  Continue diltiazem and monitor.    Stage 3b chronic kidney disease ([place]) -Renal function at baseline with creatinine of 1.37    Essential hypertension: Blood pressure stable. -Continue diltiazem 180 mg daily -Labetalol IV as needed while n.p.o.  Rheumatoid Arthritis with deformities --Chronic and stable -Not currently on any immunosuppressive medication    History of subarachnoid hemorrhage -No acute issues. -Patient did not hit her head.  No CT head and neck was indicated from the ER and based on physical exam    Prediabetes: Hemoglobin A1c pending.    Pure hypercholesterolemia -Continue simvastatin  Chest pain: Patient complained of chest pain.  On examination she has point tenderness.  She then tells me that she hurts all over the place all the time due to her severe rheumatoid arthritis.   DVT prophylaxis: SCDs Start: 03/27/20 2012   Code Status: Full Code  Family Communication: None present at bedside.  Plan of care discussed with patient in length and he verbalized understanding and agreed with it.  Status is: Inpatient  Remains inpatient appropriate because:Inpatient level of care appropriate due to severity of illness   Dispo: The patient is from: Home              Anticipated d/c is to: SNF              Anticipated  d/c date is: 3 days              Patient currently is not medically stable to d/c.        Estimated body mass index is 21.63 kg/m as calculated from the following:   Height as of this encounter: 5\' 5"  (1.651 m).   Weight as of this encounter: 59 kg.      Nutritional status:               Consultants:   Orthopedics  Procedures:   None  Antimicrobials:  Anti-infectives (From admission, onward)   Start     Dose/Rate Route Frequency  Ordered Stop   03/28/20 0000  clindamycin (CLEOCIN) IVPB 900 mg        900 mg 100 mL/hr over 30 Minutes Intravenous See admin instructions 03/27/20 1907           Subjective: Patient seen and examined.  Dr. Roland Rack of orthopedics came to see patient while I was evaluating her.  Initially she did not have any pain.  After orthopedic examination, she started hurting in the left hip and requested pain medications.  She also complained of some chest pain after that.  She had point tenderness.  Then she revealed that she always has some sort of pain all over the body due to rheumatoid arthritis.  Objective: Vitals:   03/28/20 0545 03/28/20 0600 03/28/20 0615 03/28/20 0630  BP:  100/74  116/88  Pulse:  84 95 84  Resp: 15 15 15  (!) 22  Temp:      TempSrc:      SpO2: 100% 99% 100% 100%  Weight:      Height:       No intake or output data in the 24 hours ending 03/28/20 0816 Filed Weights   03/27/20 1723  Weight: 59 kg    Examination:  General exam: Appears calm and comfortable  Respiratory system: Clear to auscultation. Respiratory effort normal. Cardiovascular system: S1 & S2 heard, RRR. No JVD, murmurs, rubs, gallops or clicks. No pedal edema. Gastrointestinal system: Abdomen is nondistended, soft and nontender. No organomegaly or masses felt. Normal bowel sounds heard. Central nervous system: Alert and oriented. No focal neurological deficits. Extremities: Left lower extremity shortened and externally rotated. Skin: No rashes, lesions or ulcers Psychiatry: Judgement and insight appear normal. Mood & affect appropriate.    Data Reviewed: I have personally reviewed following labs and imaging studies  CBC: Recent Labs  Lab 03/27/20 1652 03/28/20 0438  WBC 7.5 6.0  NEUTROABS 4.9  --   HGB 12.4 10.7*  HCT 38.8 33.3*  MCV 95.6 94.6  PLT 234 161   Basic Metabolic Panel: Recent Labs  Lab 03/27/20 1652  NA 143  K 3.7  CL 106  CO2 26  GLUCOSE 140*  BUN 18  CREATININE  1.37*  CALCIUM 9.7   GFR: Estimated Creatinine Clearance: 28 mL/min (A) (by C-G formula based on SCr of 1.37 mg/dL (H)). Liver Function Tests: Recent Labs  Lab 03/27/20 1652  AST 16  ALT 8  ALKPHOS 81  BILITOT 0.6  PROT 7.1  ALBUMIN 3.2*   No results for input(s): LIPASE, AMYLASE in the last 168 hours. No results for input(s): AMMONIA in the last 168 hours. Coagulation Profile: Recent Labs  Lab 03/27/20 1652  INR 1.1   Cardiac Enzymes: No results for input(s): CKTOTAL, CKMB, CKMBINDEX, TROPONINI in the last 168 hours. BNP (last 3 results) No results for input(s): PROBNP in the  last 8760 hours. HbA1C: No results for input(s): HGBA1C in the last 72 hours. CBG: No results for input(s): GLUCAP in the last 168 hours. Lipid Profile: No results for input(s): CHOL, HDL, LDLCALC, TRIG, CHOLHDL, LDLDIRECT in the last 72 hours. Thyroid Function Tests: No results for input(s): TSH, T4TOTAL, FREET4, T3FREE, THYROIDAB in the last 72 hours. Anemia Panel: No results for input(s): VITAMINB12, FOLATE, FERRITIN, TIBC, IRON, RETICCTPCT in the last 72 hours. Sepsis Labs: No results for input(s): PROCALCITON, LATICACIDVEN in the last 168 hours.  Recent Results (from the past 240 hour(s))  Resp Panel by RT-PCR (Flu A&B, Covid) Nasopharyngeal Swab     Status: None   Collection Time: 03/27/20  8:51 PM   Specimen: Nasopharyngeal Swab; Nasopharyngeal(NP) swabs in vial transport medium  Result Value Ref Range Status   SARS Coronavirus 2 by RT PCR NEGATIVE NEGATIVE Final    Comment: (NOTE) SARS-CoV-2 target nucleic acids are NOT DETECTED.  The SARS-CoV-2 RNA is generally detectable in upper respiratory specimens during the acute phase of infection. The lowest concentration of SARS-CoV-2 viral copies this assay can detect is 138 copies/mL. A negative result does not preclude SARS-Cov-2 infection and should not be used as the sole basis for treatment or other patient management decisions. A  negative result may occur with  improper specimen collection/handling, submission of specimen other than nasopharyngeal swab, presence of viral mutation(s) within the areas targeted by this assay, and inadequate number of viral copies(<138 copies/mL). A negative result must be combined with clinical observations, patient history, and epidemiological information. The expected result is Negative.  Fact Sheet for Patients:  EntrepreneurPulse.com.au  Fact Sheet for Healthcare Providers:  IncredibleEmployment.be  This test is no t yet approved or cleared by the [place] FDA and  has been authorized for detection and/or diagnosis of SARS-CoV-2 by FDA under an Emergency Use Authorization (EUA). This EUA will remain  in effect (meaning this test can be used) for the duration of the COVID-19 declaration under Section 564(b)(1) of the Act, 21 U.S.C.section 360bbb-3(b)(1), unless the authorization is terminated  or revoked sooner.       Influenza A by PCR NEGATIVE NEGATIVE Final   Influenza B by PCR NEGATIVE NEGATIVE Final    Comment: (NOTE) The Xpert Xpress SARS-CoV-2/FLU/RSV plus assay is intended as an aid in the diagnosis of influenza from Nasopharyngeal swab specimens and should not be used as a sole basis for treatment. Nasal washings and aspirates are unacceptable for Xpert Xpress SARS-CoV-2/FLU/RSV testing.  Fact Sheet for Patients: EntrepreneurPulse.com.au  Fact Sheet for Healthcare Providers: IncredibleEmployment.be  This test is not yet approved or cleared by the [place] FDA and has been authorized for detection and/or diagnosis of SARS-CoV-2 by FDA under an Emergency Use Authorization (EUA). This EUA will remain in effect (meaning this test can be used) for the duration of the COVID-19 declaration under Section 564(b)(1) of the Act, 21 U.S.C. section 360bbb-3(b)(1), unless the authorization  is terminated or revoked.  Performed at [facility], [address], [place], [place] [address]       Radiology Studies: Chest Portable 1 View  Result Date: 03/27/2020 CLINICAL DATA:  Preoperative evaluation. EXAM: PORTABLE CHEST 1 VIEW COMPARISON:  None. FINDINGS: Mild, diffuse, chronic appearing increased lung markings are seen. There is no evidence of acute infiltrate, pleural effusion or pneumothorax. The cardiac silhouette is mildly enlarged. There is moderate severity calcification of the aortic arch. Radiopaque surgical clips are seen within the right upper quadrant. The visualized skeletal  structures are unremarkable. IMPRESSION: No evidence of acute or active cardiopulmonary disease. Electronically Signed   By: Virgina Norfolk M.D.   On: 03/27/2020 20:39   DG Hip Unilat With Pelvis 2-3 Views Left  Result Date: 03/27/2020 CLINICAL DATA:  Status post fall. EXAM: DG HIP (WITH OR WITHOUT PELVIS) 2-3V LEFT COMPARISON:  CT renal 11/03/2019. FINDINGS: Displaced transcervical neck versus intertrochanteric fracture of the left femur that extends to the proximal femoral shaft. No acute dislocation of the left hip. Frontal views of the right hip and pelvis are unremarkable. There is no evidence of severe arthropathy or aggressive appearing focal bone abnormality. Vascular calcifications. IMPRESSION: 1. Displaced transcervical neck versus intertrochanteric fracture of the left femur that extends to the proximal femoral shaft. 2. No dislocation of the left hip. 3. Frontal views of the pelvis and right hip are unremarkable. Electronically Signed   By: Iven Finn M.D.   On: 03/27/2020 18:18    Scheduled Meds: . diltiazem  180 mg Oral Daily   Continuous Infusions: . sodium chloride 75 mL/hr at 03/27/20 2050  . clindamycin (CLEOCIN) IV       LOS: 1 day   Time spent: 33-minute   Darliss Cheney, MD [facility]  03/28/2020, 8:16 AM   To contact the attending  provider between 7A-7P or the covering provider during after hours 7P-7A, please log into the web site [email or web address].

## 2020-03-28 NOTE — H&P (Signed)
H&P reviewed. No significant changes noted.  I will take over care of this patient from Dr. Roland Rack. Plan for OR shortly for L hip IM nail.

## 2020-03-28 NOTE — ED Notes (Signed)
Attempted report x 2 

## 2020-03-28 NOTE — Anesthesia Procedure Notes (Signed)
Procedure Name: Intubation Date/Time: 03/28/2020 1:46 PM Performed by: Jonna Clark, CRNA Pre-anesthesia Checklist: Patient identified, Patient being monitored, Timeout performed, Emergency Drugs available and Suction available Patient Re-evaluated:Patient Re-evaluated prior to induction Oxygen Delivery Method: Circle system utilized Preoxygenation: Pre-oxygenation with 100% oxygen Induction Type: IV induction Ventilation: Mask ventilation without difficulty Laryngoscope Size: 3 and McGraph Grade View: Grade I Tube type: Oral Tube size: 7.0 mm Number of attempts: 1 Airway Equipment and Method: Stylet Placement Confirmation: ETT inserted through vocal cords under direct vision,  positive ETCO2 and breath sounds checked- equal and bilateral Secured at: 21 cm Tube secured with: Tape Dental Injury: Teeth and Oropharynx as per pre-operative assessment

## 2020-03-28 NOTE — Transfer of Care (Signed)
Immediate Anesthesia Transfer of Care Note  Patient: Roberta Ross  Procedure(s) Performed: INTRAMEDULLARY (IM) NAIL INTERTROCHANTRIC-Hip Fracture (Left )  Patient Location: PACU  Anesthesia Type:General  Level of Consciousness: drowsy and patient cooperative  Airway & Oxygen Therapy: Patient Spontanous Breathing and Patient connected to face mask oxygen  Post-op Assessment: Report given to RN and Post -op Vital signs reviewed and stable  Post vital signs: Reviewed and stable  Last Vitals:  Vitals Value Taken Time  BP 105/81 03/28/20 1512  Temp 36.4 C 03/28/20 1508  Pulse 97 03/28/20 1513  Resp 14 03/28/20 1513  SpO2 92 % 03/28/20 1513  Vitals shown include unvalidated device data.  Last Pain:  Vitals:   03/28/20 1508  TempSrc:   PainSc: Asleep         Complications: No complications documented.

## 2020-03-28 NOTE — ED Notes (Signed)
Attempted report x1. 

## 2020-03-28 NOTE — Anesthesia Preprocedure Evaluation (Addendum)
Anesthesia Evaluation  Patient identified by MRN, date of birth, ID band Patient awake    Reviewed: Allergy & Precautions, NPO status , Patient's Chart, lab work & pertinent test results  Airway Mallampati: III       Dental  (+) Edentulous Upper Lower teeth - poor dentition:   Pulmonary COPD, Current Smoker and Patient abstained from smoking.,    Pulmonary exam normal breath sounds clear to auscultation       Cardiovascular hypertension, +CHF  + dysrhythmias Atrial Fibrillation + Valvular Problems/Murmurs MR  Rhythm:Irregular Rate:Normal + Diastolic murmurs Echocardiogram 2D complete: (06/2019) INTERPRETATION MODERATE LV SYSTOLIC DYSFUNCTION (See above) WITH MILD LVH NORMAL RIGHT VENTRICULAR SYSTOLIC FUNCTION MODERATE VALVULAR REGURGITATION (See above) NO VALVULAR STENOSIS MODERATE MR MILD TR, PR TRIVIAL AR EF 30%  NM Myocardial Perfusion SPECT multiple (stress and rest): (06/2019) IMPRESSION: Mildly abnormal perfusion scan no significant left ventricular ischemia mild left ventricular enlargement evidence of RV enlargement significant increase in RV uptake ejection fraction of around 40% global hypo-. Intermediate scan recommend conservative medical therapy    Neuro/Psych PSYCHIATRIC DISORDERS Anxiety    GI/Hepatic   Endo/Other  negative endocrine ROS  Renal/GU CRFRenal disease  negative genitourinary   Musculoskeletal  (+) Arthritis , Rheumatoid disorders,    Abdominal   Peds  Hematology  (+) anemia ,   Anesthesia Other Findings Past Medical History: No date: Anemia     Comment:  past hx No date: Arthritis     Comment:  hands and feet No date: COPD (chronic obstructive pulmonary disease) (HCC) No date: Dysrhythmia     Comment:  Pt reports slow HR and papatations No date: Heart murmur     Comment:  past hx No date: Hypercholesteremia No date: Hypertension No date: Rheumatic fever     Comment:  (x2) in  past No date: Wears dentures     Comment:  full upper  Reproductive/Obstetrics                           Anesthesia Physical Anesthesia Plan  ASA: III  Anesthesia Plan: General   Post-op Pain Management:    Induction: Intravenous  PONV Risk Score and Plan: 2 and Ondansetron and Dexamethasone  Airway Management Planned: Oral ETT  Additional Equipment:   Intra-op Plan:   Post-operative Plan: Extubation in OR  Informed Consent: I have reviewed the patients History and Physical, chart, labs and discussed the procedure including the risks, benefits and alternatives for the proposed anesthesia with the patient or authorized representative who has indicated his/her understanding and acceptance.     Dental advisory given  Plan Discussed with: CRNA, Surgeon and Anesthesiologist  Anesthesia Plan Comments:       Anesthesia Quick Evaluation

## 2020-03-28 NOTE — Consult Note (Signed)
ORTHOPAEDIC CONSULTATION  REQUESTING PHYSICIAN: Darliss Cheney, MD  Chief Complaint:   Left hip pain.  History of Present Illness: Roberta Ross is a 86 y.o. female with multiple medical problems including hypertension, atrial fibrillation, hypercholesterolemia, COPD, chronic kidney disease, and a history of severe degenerative joint disease primarily involving her hands and feet. The patient was in her usual state of health when she apparently lost her balance and fell onto her left hip yesterday while trying to get out of her chair. She was unable to bear weight so she was brought to the emergency room where x-rays demonstrated a displaced intertrochanteric fracture of the left hip. The patient denies any associated injuries. She did not strike her head or lose consciousness. She also denies any lightheadedness, dizziness, chest pain, shortness of breath, or other symptoms which may have precipitated her injury.  Past Medical History:  Diagnosis Date  . Anemia    past hx  . Arthritis    hands and feet  . COPD (chronic obstructive pulmonary disease) ([place])   . Dysrhythmia    Pt reports slow HR and papatations  . Heart murmur    past hx  . Hypercholesteremia   . Hypertension   . Rheumatic fever    (x2) in past  . Wears dentures    full upper   Past Surgical History:  Procedure Laterality Date  . ABDOMINAL HYSTERECTOMY    . APPENDECTOMY    . CATARACT EXTRACTION W/PHACO Left 01/10/2015   Procedure: CATARACT EXTRACTION PHACO AND INTRAOCULAR LENS PLACEMENT (IOC);  Surgeon: Ronnell Freshwater, MD;  Location: [facility];  Service: Ophthalmology;  Laterality: Left;  . TONSILLECTOMY     Social History   Socioeconomic History  . Marital status: Single    Spouse name: Not on file  . Number of children: Not on file  . Years of education: Not on file  . Highest education level: Not on file  Occupational  History  . Not on file  Tobacco Use  . Smoking status: Current Every Day Smoker    Packs/day: 0.25    Years: 60.00    Pack years: 15.00  Substance and Sexual Activity  . Alcohol use: No  . Drug use: Not on file  . Sexual activity: Not on file  Other Topics Concern  . Not on file  Social History Narrative  . Not on file   Social Determinants of Health   Financial Resource Strain:   . Difficulty of Paying Living Expenses: Not on file  Food Insecurity:   . Worried About Charity fundraiser in the Last Year: Not on file  . Ran Out of Food in the Last Year: Not on file  Transportation Needs:   . Lack of Transportation (Medical): Not on file  . Lack of Transportation (Non-Medical): Not on file  Physical Activity:   . Days of Exercise per Week: Not on file  . Minutes of Exercise per Session: Not on file  Stress:   . Feeling of Stress : Not on file  Social Connections:   . Frequency of Communication with Friends and Family: Not on file  . Frequency of Social Gatherings with Friends and Family: Not on file  . Attends Religious Services: Not on file  . Active Member of Clubs or Organizations: Not on file  . Attends Archivist Meetings: Not on file  . Marital Status: Not on file   History reviewed. No pertinent family history. Allergies  Allergen Reactions  .  Penicillins Other (See Comments)    Internal swelling including throat  . Methotrexate Derivatives Nausea Only   Prior to Admission medications   Medication Sig Start Date End Date Taking? Authorizing Provider  acetaminophen (ACETAMINOPHEN 8 HOUR) 650 MG CR tablet Take 650 mg by mouth 2 (two) times daily.   Yes [provider]  diltiazem (CARDIZEM) 120 MG tablet Take 120 mg by mouth 1 day or 1 dose.   Yes [provider]  furosemide (LASIX) 20 MG tablet Take 20 mg by mouth daily.   Yes [provider]  simvastatin (ZOCOR) 20 MG tablet Take by mouth. 06/29/15  Yes [provider]    Chest Portable 1 View  Result Date: 03/27/2020 CLINICAL DATA:  Preoperative evaluation. EXAM: PORTABLE CHEST 1 VIEW COMPARISON:  None. FINDINGS: Mild, diffuse, chronic appearing increased lung markings are seen. There is no evidence of acute infiltrate, pleural effusion or pneumothorax. The cardiac silhouette is mildly enlarged. There is moderate severity calcification of the aortic arch. Radiopaque surgical clips are seen within the right upper quadrant. The visualized skeletal structures are unremarkable. IMPRESSION: No evidence of acute or active cardiopulmonary disease. Electronically Signed   By: Virgina Norfolk M.D.   On: 03/27/2020 20:39   DG Hip Unilat With Pelvis 2-3 Views Left  Result Date: 03/27/2020 CLINICAL DATA:  Status post fall. EXAM: DG HIP (WITH OR WITHOUT PELVIS) 2-3V LEFT COMPARISON:  CT renal 11/03/2019. FINDINGS: Displaced transcervical neck versus intertrochanteric fracture of the left femur that extends to the proximal femoral shaft. No acute dislocation of the left hip. Frontal views of the right hip and pelvis are unremarkable. There is no evidence of severe arthropathy or aggressive appearing focal bone abnormality. Vascular calcifications. IMPRESSION: 1. Displaced transcervical neck versus intertrochanteric fracture of the left femur that extends to the proximal femoral shaft. 2. No dislocation of the left hip. 3. Frontal views of the pelvis and right hip are unremarkable. Electronically Signed   By: Iven Finn M.D.   On: 03/27/2020 18:18    Positive ROS: All other systems have been reviewed and were otherwise negative with the exception of those mentioned in the HPI and as above.  Physical Exam: General:  Alert, no acute distress Psychiatric:  Patient is competent for consent with normal mood and affect   Cardiovascular:  No pedal edema Respiratory:  No wheezing, non-labored breathing GI:  Abdomen is soft and non-tender Skin:  No lesions in the area of chief  complaint Neurologic:  Sensation intact distally Lymphatic:  No axillary or cervical lymphadenopathy  Orthopedic Exam:  Orthopedic examination is limited left hip and lower extremity. The left lower extremity is somewhat shortened and externally rotated as compared to the right. Skin inspection around the left hip is unremarkable. No swelling, erythema, ecchymosis, abrasions, or other skin abnormalities are identified. She has mild-moderate tenderness to palpation over the lateral aspect of the left hip. She has severe pain with any attempted active or passive motion of the hip. She is neurovascularly intact to the left lower extremity and foot, demonstrating the ability to actively dorsiflex and plantarflex her toes. Sensation is intact to light touch to all distributions. She has good capillary refill to her left foot.  X-rays:  X-rays of the pelvis and left hip are available for review and have been reviewed by myself. These films demonstrate a displaced intertrochanteric fracture of the left hip. No significant degenerative changes of the left hip are noted. No lytic lesions or other acute bony  abnormalities are identified.   Assessment: Displaced intertrochanteric fracture left hip.  Plan: The treatment options, including both surgical and nonsurgical choices, have been discussed in detail with the patient. The patient would like to proceed with surgical intervention to include an intramedullary nailing of the displaced left hip fracture. The risks (including bleeding, infection, nerve and/or blood vessel injury, persistent or recurrent pain, loosening or failure of the components, malunion and/or nonunion, need for further surgery, blood clots, strokes, heart attacks or arrhythmias, pneumonia, etc.) and benefits of the surgical procedure were discussed. The patient states her understanding and agrees to proceed. She agrees to a blood transfusion if necessary. A formal written consent will be  obtained by the nursing staff.  Thank you for asking me to participate in the care of this most unfortunate woman. I will be happy to follow her with you.   Pascal Lux, MD  Beeper #:  [phone]  03/28/2020 8:08 AM

## 2020-03-28 NOTE — Op Note (Addendum)
DATE OF SURGERY: 03/28/2020  PREOPERATIVE DIAGNOSIS: Left intertrochanteric hip fracture  POSTOPERATIVE DIAGNOSIS: Left intertrochanteric hip fracture  PROCEDURE: Intramedullary nailing of Left femur with cephalomedullary device  SURGEON: Cato Mulligan, MD  ANESTHESIA: Gen  EBL: 50 cc  IVF: per anesthesia record  COMPONENTS:  [employer] Trigen Intertan Short Nail: 10x219mm; 124mm lag screw with 73mm compression screw; 5x 51mm distal cortical interlocking screw  INDICATIONS: Roberta Ross is a 86 y.o. female who sustained an intertrochanteric fracture after a fall. Risks and benefits of intramedullary nailing were explained to the patient and/or family . Risks include but are not limited to bleeding, infection, injury to tissues, nerves, vessels, nonunion/malunion, hardware failure, limb length discrepancy/hip rotation mismatch and risks of anesthesia. The patient and/or family understand these risks, have completed an informed consent, and wish to proceed.   PROCEDURE:  The patient was brought into the operating room. After administering anesthesia, the patient was placed in the supine position on the fracture table. The uninjured leg was placed in a well leg holder with appropriate padding. The affected extremityty was placed in longitudinal traction with appropriate padding. The fracture was reduced using longitudinal traction and internal rotation. The adequacy of reduction was verified fluoroscopically in AP and lateral projections and found to be acceptable. The lateral aspect of the hip and thigh were prepped with ChloraPrep solution before being draped sterilely. Preoperative IV antibiotics were administered. A timeout was performed to verify the appropriate surgical site, patient, and procedure.    The greater trochanter was identified and an approximately 6 cm incision was made about 3 fingerbreadths above the tip of the greater trochanter. The incision was carried down  through the subcutaneous tissues to expose the gluteal fascia. This was split the length of the incision, providing access to the tip of the trochanter. Under fluoroscopic guidance, a guidewire was drilled through the tip of the trochanter into the proximal metaphysis to the level of the lesser trochanter. After verifying its position fluoroscopically in AP and lateral projections, it was overreamed with the opening reamer to the level of the lesser trochanter. A guidewire was passed down through the femoral canal to the supracondylar region. The guidewire was overreamed sequentially using the flexible reamers. The nail was selected and advanced to the appropriate depth as verified fluoroscopically.    The guide system for the lag screw was positioned and advanced through an approximately 5cm incision over the lateral aspect of the proximal femur. The guidewire was drilled up through the femoral nail and into the femoral neck to rest within 5 mm of subchondral bone. After verifying its position in the femoral neck and head in both AP and lateral projections, the guidewire was measured and appropriate sized lag screw was selected.  The channel for the compression screw was drilled and antirotation bar was placed.  Lag screw was drilled and placed in appropriate position.  Compression screw was then placed.  Appropriate compression was achieved.  The set screw was locked in the place. Again, the adequacy of hardware position and fracture reduction was verified fluoroscopically in AP and lateral projections.   Attention was then turned to the distal interlocking screw in the diaphysis. Using a targeted assembly, a stab incision was made and hole was drilled through the nail. An interlocking screw was placed with excellent purchase.  Appropriate screw position was verified fluoroscopically in AP and lateral projections.   The wounds were irrigated thoroughly with sterile saline solution. Local anesthetic was  injected into  the wounds. Deep fascia was closed with 0-Vicryl. The subcutaneous tissues were closed using 2-0 Vicryl interrupted sutures. The skin was closed using staples. Sterile occlusive dressings were applied to all wounds. The patient was then transferred to the recovery room in satisfactory condition.   POSTOPERATIVE PLAN: The patient will be WBAT on the operative extremity. Lovenox 40mg /day x 4 weeks to start on POD#1. Perioperative IV antibiotics x 24 hours. PT/OT on POD#1.

## 2020-03-29 ENCOUNTER — Encounter: Payer: Self-pay | Admitting: Orthopedic Surgery

## 2020-03-29 LAB — CBC
HCT: 31 % — ABNORMAL LOW (ref 36.0–46.0)
Hemoglobin: 9.9 g/dL — ABNORMAL LOW (ref 12.0–15.0)
MCH: 30.7 pg (ref 26.0–34.0)
MCHC: 31.9 g/dL (ref 30.0–36.0)
MCV: 96.3 fL (ref 80.0–100.0)
Platelets: 159 10*3/uL (ref 150–400)
RBC: 3.22 MIL/uL — ABNORMAL LOW (ref 3.87–5.11)
RDW: 13.2 % (ref 11.5–15.5)
WBC: 8.5 10*3/uL (ref 4.0–10.5)
nRBC: 0 % (ref 0.0–0.2)

## 2020-03-29 LAB — BASIC METABOLIC PANEL
Anion gap: 9 (ref 5–15)
BUN: 24 mg/dL — ABNORMAL HIGH (ref 8–23)
CO2: 23 mmol/L (ref 22–32)
Calcium: 9.4 mg/dL (ref 8.9–10.3)
Chloride: 104 mmol/L (ref 98–111)
Creatinine, Ser: 1.42 mg/dL — ABNORMAL HIGH (ref 0.44–1.00)
GFR, Estimated: 37 mL/min — ABNORMAL LOW (ref >60)
Glucose, Bld: 171 mg/dL — ABNORMAL HIGH (ref 70–99)
Potassium: 4.4 mmol/L (ref 3.5–5.1)
Sodium: 136 mmol/L (ref 135–145)

## 2020-03-29 MED ORDER — TRAMADOL HCL 50 MG PO TABS: 50.0000 mg | ORAL_TABLET | Freq: Four times a day (QID) | ORAL | 0 refills | Status: DC | PRN

## 2020-03-29 MED ORDER — ENOXAPARIN SODIUM 40 MG/0.4ML ~~LOC~~ SOLN: 40.0000 mg | SUBCUTANEOUS | 0 refills | Status: DC

## 2020-03-29 MED ORDER — BISACODYL 10 MG RE SUPP
10.0000 mg | Freq: Once | RECTAL | Status: AC
  Administered 2020-03-29: 10 mg via RECTAL
  Filled 2020-03-29: qty 1

## 2020-03-29 MED ORDER — APIXABAN 2.5 MG PO TABS
2.5000 mg | ORAL_TABLET | Freq: Two times a day (BID) | ORAL | Status: DC
  Administered 2020-03-29 – 2020-03-31 (×4): 2.5 mg via ORAL
  Filled 2020-03-29 (×4): qty 1

## 2020-03-29 MED ORDER — OXYCODONE HCL 5 MG PO TABS
2.5000 mg | ORAL_TABLET | ORAL | 0 refills | Status: DC | PRN
Start: 2020-03-29 — End: 2021-11-15

## 2020-03-29 NOTE — Progress Notes (Signed)
Physical Therapy Treatment Patient Details Name: Roberta Ross MRN: [id number] DOB: 1933-08-12 Today's Date: 03/29/2020    History of Present Illness Pt presents following a fall with L intertrochanteric hip fx. She is now s/p IMN for the L hip. The pt has PMHx significant for HTN, prediabetes, anxiety, HTN, RA, CKD stage IIIb, and atrial fibrillation.    PT Comments    Pt given HEP handout for strengthening of operative limb. Overall she is presenting with need for AAROM for exercises at this time. Her cognitive status presents with greater decline for this morning. She requires multiple cues for orientation and attention to task at this time. The pt continues to be appropriate for STR at SNF.    Follow Up Recommendations  SNF     Equipment Recommendations       Recommendations for Other Services       Precautions / Restrictions Precautions Precautions: Fall Restrictions Weight Bearing Restrictions: Yes LLE Weight Bearing: Weight bearing as tolerated    Mobility  Bed Mobility Overal bed mobility: Needs Assistance Bed Mobility: Rolling Rolling: Mod assist         General bed mobility comments: Pt demonstrates getting LEs to EOB with mod A but declines supine <>sit  Transfers                 General transfer comment: Pt declined this session  Ambulation/Gait                 Stairs             Wheelchair Mobility    Modified Rankin (Stroke Patients Only)       Balance                                            Cognition Arousal/Alertness: Awake/alert Behavior During Therapy: WFL for tasks assessed/performed Overall Cognitive Status: Impaired/Different from baseline Area of Impairment: Memory;Safety/judgement;Awareness                     Memory: Decreased short-term memory   Safety/Judgement: Decreased awareness of deficits     General Comments: Pt guessing at date and month      Exercises  General Exercises - Lower Extremity Ankle Circles/Pumps: AROM;Both;20 reps Gluteal Sets: AROM;Both;10 reps Heel Slides: AAROM;Left;10 reps;Supine Hip ABduction/ADduction: AAROM;Left;5 reps;Supine Straight Leg Raises: AAROM;Left;10 reps;Supine    General Comments        Pertinent Vitals/Pain Pain Assessment: 0-10 Pain Score: 7  Pain Location: L hip Pain Descriptors / Indicators: Guarding;Grimacing;Operative site guarding;Spasm;Throbbing Pain Intervention(s): Monitored during session;Patient requesting pain meds-RN notified    Home Living Family/patient expects to be discharged to:: Private residence Living Arrangements: Alone Available Help at Discharge: Family;Available PRN/intermittently Type of Home: House Home Access: Stairs to enter Entrance Stairs-Rails: Right Home Layout: One level Home Equipment: None      Prior Function Level of Independence: Independent          PT Goals (current goals can now be found in the care plan section) Acute Rehab PT Goals Patient Stated Goal: to go home PT Goal Formulation: With patient Time For Goal Achievement: 04/12/20 Potential to Achieve Goals: Fair Progress towards PT goals: Progressing toward goals    Frequency    BID      PT Plan Current plan remains appropriate    Co-evaluation  AM-PAC PT "6 Clicks" Mobility   Outcome Measure  Help needed turning from your back to your side while in a flat bed without using bedrails?: A Little Help needed moving from lying on your back to sitting on the side of a flat bed without using bedrails?: A Lot Help needed moving to and from a bed to a chair (including a wheelchair)?: Total Help needed standing up from a chair using your arms (e.g., wheelchair or bedside chair)?: A Lot Help needed to walk in hospital room?: Total Help needed climbing 3-5 steps with a railing? : Total 6 Click Score: 10    End of Session   Activity Tolerance: Patient limited by  pain Patient left: in bed;with call bell/phone within reach;with bed alarm set;with SCD's reapplied   PT Visit Diagnosis: Unsteadiness on feet (R26.81);Muscle weakness (generalized) (M62.81);Difficulty in walking, not elsewhere classified (R26.2);Pain Pain - Right/Left: Left Pain - part of body: Hip     Time: 8338-2505 PT Time Calculation (min) (ACUTE ONLY): 35 min  Charges:  $Therapeutic Exercise: 23-37 mins                     2:39 PM, 03/29/20 Quintan Saldivar A. Saverio Danker PT, DPT Physical Therapist - [facility]    Journi Moffa A Marciana Uplinger 03/29/2020, 2:35 PM

## 2020-03-29 NOTE — Anesthesia Postprocedure Evaluation (Signed)
Anesthesia Post Note  Patient: Roberta Ross  Procedure(s) Performed: INTRAMEDULLARY (IM) NAIL INTERTROCHANTRIC-Hip Fracture (Left )  Patient location during evaluation: PACU Anesthesia Type: General Level of consciousness: awake and awake and alert Pain management: pain level controlled Vital Signs Assessment: post-procedure vital signs reviewed and stable Respiratory status: spontaneous breathing Cardiovascular status: blood pressure returned to baseline and stable Postop Assessment: no apparent nausea or vomiting Anesthetic complications: no   No complications documented.   Last Vitals:  Vitals:   03/29/20 0949 03/29/20 1146  BP:  104/73  Pulse:  76  Resp:  20  Temp:  36.5 C  SpO2: (!) 83% 98%    Last Pain:  Vitals:   03/29/20 1146  TempSrc: Oral  PainSc:                  Neva Seat

## 2020-03-29 NOTE — Evaluation (Signed)
Physical Therapy Evaluation Patient Details Name: Roberta Ross MRN: [id number] DOB: 04-Sep-1933 Today's Date: 03/29/2020   History of Present Illness  Pt presents following a fall with L intertrochanteric hip fx. She is now s/p IMN for the L hip. The pt has PMHx significant for HTN, prediabetes, anxiety, HTN, RA, CKD stage IIIb, and atrial fibrillation.  Clinical Impression  The pt presents this session with fear of pain as greatest limiting factor for mobility. She has limited pain tolerance stating "she does not want to hurt." The pt is educated on pain control and following this procedure that some pain is expected. Together we were able to determine an acceptable pain limit of 5/10. The pt is able to stand with Max A and use of RW. She is able to perform lateral weight shifting to the L in order to begin pre-gait or gait activity. Following session the pt is understanding of STR needs. At this time the pt would best benefit from [facility] order to optimize independence prior to d/c home.     Follow Up Recommendations SNF    Equipment Recommendations       Recommendations for Other Services       Precautions / Restrictions Precautions Precautions: Fall Restrictions Weight Bearing Restrictions: Yes LLE Weight Bearing: Weight bearing as tolerated      Mobility  Bed Mobility Overal bed mobility: Needs Assistance Bed Mobility: Supine to Sit;Sit to Supine     Supine to sit: Mod assist Sit to supine: Max assist        Transfers Overall transfer level: Needs assistance Equipment used: Rolling walker (2 wheeled) Transfers: Sit to/from Stand Sit to Stand: Max assist            Ambulation/Gait             General Gait Details: Attempted standing marching; pt presenting with decreased L lateral weight shift in order to off weight RLE for steps.  Stairs            Wheelchair Mobility    Modified Rankin (Stroke Patients Only)       Balance Overall balance  assessment: Needs assistance Sitting-balance support: Bilateral upper extremity supported Sitting balance-Leahy Scale: Fair   Postural control: Right lateral lean Standing balance support: Bilateral upper extremity supported;During functional activity Standing balance-Leahy Scale: Poor                               Pertinent Vitals/Pain Pain Assessment: No/denies pain (Pain increased to 5/10 with attempted stepping at bedside.)    Home Living Family/patient expects to be discharged to:: Private residence Living Arrangements: Alone Available Help at Discharge: Family;Available PRN/intermittently Type of Home: House Home Access: Stairs to enter Entrance Stairs-Rails: Right Entrance Stairs-Number of Steps: 4 Home Layout: One level Home Equipment: None      Prior Function Level of Independence: Independent               Hand Dominance   Dominant Hand: Right    Extremity/Trunk Assessment   Upper Extremity Assessment Upper Extremity Assessment: Generalized weakness    Lower Extremity Assessment Lower Extremity Assessment: LLE deficits/detail LLE: Unable to fully assess due to pain LLE Sensation: WNL LLE Coordination: decreased gross motor    Cervical / Trunk Assessment Cervical / Trunk Assessment: Kyphotic  Communication   Communication: HOH  Cognition Arousal/Alertness: Awake/alert Behavior During Therapy: WFL for tasks assessed/performed Overall Cognitive Status: Impaired/Different from baseline Area of  Impairment: Memory                     Memory: Decreased short-term memory (Pt requiring constant reorientation to task.)                General Comments      Exercises General Exercises - Lower Extremity Hip ABduction/ADduction: AAROM;5 reps;Left;Supine   Assessment/Plan    PT Assessment Patient needs continued PT services  PT Problem List Decreased strength;Decreased mobility;Decreased activity tolerance;Decreased  cognition;Decreased balance       PT Treatment Interventions DME instruction;Therapeutic activities;Gait training;Therapeutic exercise;Stair training;Functional mobility training;Balance training    PT Goals (Current goals can be found in the Care Plan section)  Acute Rehab PT Goals Patient Stated Goal: to go home PT Goal Formulation: With patient Time For Goal Achievement: 04/12/20 Potential to Achieve Goals: Fair    Frequency BID   Barriers to discharge Decreased caregiver support      Co-evaluation               AM-PAC PT "6 Clicks" Mobility  Outcome Measure Help needed turning from your back to your side while in a flat bed without using bedrails?: A Little Help needed moving from lying on your back to sitting on the side of a flat bed without using bedrails?: A Lot Help needed moving to and from a bed to a chair (including a wheelchair)?: Total Help needed standing up from a chair using your arms (e.g., wheelchair or bedside chair)?: A Lot Help needed to walk in hospital room?: Total Help needed climbing 3-5 steps with a railing? : Total 6 Click Score: 10    End of Session Equipment Utilized During Treatment: Gait belt Activity Tolerance: Patient limited by pain Patient left: in bed;with family/visitor present;with call bell/phone within reach;with bed alarm set;with SCD's reapplied Nurse Communication: Mobility status;Other (comment) (Addition of oxygen d/t desaturation.) PT Visit Diagnosis: Unsteadiness on feet (R26.81);Muscle weakness (generalized) (M62.81);Difficulty in walking, not elsewhere classified (R26.2);Pain Pain - Right/Left: Left Pain - part of body: Hip    Time: 4503-8882 PT Time Calculation (min) (ACUTE ONLY): 55 min   Charges:   PT Evaluation $PT Eval Low Complexity: 1 Low PT Treatments $Therapeutic Exercise: 8-22 mins $Therapeutic Activity: 23-37 mins        10:14 AM, 03/29/20 Roberta Ross PT, DPT Physical Therapist - [facility]   Roberta Roberta Ross 03/29/2020, 10:09 AM

## 2020-03-29 NOTE — Progress Notes (Addendum)
PROGRESS NOTE    Roberta Ross  WHQ:[id number] DOB: Dec 19, 1933 DOA: 03/27/2020 PCP: Kirk Ruths, MD   Brief Narrative:  HPI: Roberta Ross is a 86 y.o. female with medical history significant for , HTN, prediabetes, anxiety, HTN, RA, CKD stage IIIb, atrial fibrillation not on anticoagulants and history of subarachnoid hemorrhage who presented to the emergency room following an accidental fall after she lost her balance while trying to get out of a chair falling backwards with immediate pain of the left hip and inability to bear weight. On arrival she was slightly tachycardic at 103, mild tachypnea with otherwise normal vitals.  CBC completely WNL, CMP significant for creatinine of 1.37 which is about her baseline for his CKD.  INR 1.1.  Left hip x-ray showing displaced transcervical neck versus intertrochanteric fracture of the left femur extending to the proximal femoral shaft. Underwent surgical repair by orthopedics.  Assessment & Plan:   Principal Problem:   Closed fracture of femur, intertrochanteric, left, initial encounter ([facility]) Active Problems:   A-fib (HCC)   Stage 3b chronic kidney disease ([place])   Essential hypertension   GAD (generalized anxiety disorder)   H/O rheumatoid arthritis   History of subarachnoid hemorrhage   Prediabetes   Pure hypercholesterolemia   COPD with chronic bronchitis (HCC)   Accidental fall   Preoperative clearance   Closed left hip fracture, initial encounter (HCC)     Closed fracture of femur, intertrochanteric, left, initial encounter ([place]) due to accidental fall: Orthopedics on board.  Status post intramedullary nail.  Continue current pain management.  Management per orthopedics.  Will be seen by PT OT.  Acute blood loss anemia: Hemoglobin dropped about 3 g, postoperatively.  Still no indication of transfusion.  Repeat CBC in the morning.  No bleeding from the surgical site.  CKD stage IIIb: At baseline.  Monitor.    A-fib (HCC)  with mild RVR: Does not seem to be in RVR anymore.  Asymptomatic.  Not on any anticoagulation due to previous history of brain bleed.  Continue diltiazem and monitor.    Stage 3b chronic kidney disease ([place]) -Renal function at baseline with creatinine of 1.37    Essential hypertension: Blood pressure slightly on the low side but she is completely asymptomatic. -Continue diltiazem 180 mg daily -Labetalol IV as needed while n.p.o.  Rheumatoid Arthritis with deformities --Chronic and stable -Not currently on any immunosuppressive medication    History of subarachnoid hemorrhage -No acute issues. -Patient did not hit her head.  No CT head and neck was indicated from the ER and based on physical exam    Prediabetes: Hemoglobin A1c pending.    Pure hypercholesterolemia -Continue simvastatin  Chest pain: Patient complained of chest pain yesterday and on examination she has point tenderness.  She then tells me that she hurts all over the place all the time due to her severe rheumatoid arthritis.   DVT prophylaxis: enoxaparin (LOVENOX) injection 30 mg Start: 03/29/20 0800 SCDs Start: 03/28/20 1805   Code Status: Full Code  Family Communication: None present at bedside.  Plan of care discussed with patient in length and he verbalized understanding and agreed with it.  Status is: Inpatient  Remains inpatient appropriate because:Inpatient level of care appropriate due to severity of illness   Dispo: The patient is from: Home              Anticipated d/c is to: SNF              Anticipated d/c  date is: 1 to 2 days              Patient currently is not medically stable to d/c.        Estimated body mass index is 21.64 kg/m as calculated from the following:   Height as of this encounter: 5\' 5"  (1.651 m).   Weight as of this encounter: 59 kg.      Nutritional status:               Consultants:   Orthopedics  Procedures:   As above  Antimicrobials:   Anti-infectives (From admission, onward)   Start     Dose/Rate Route Frequency Ordered Stop   03/28/20 2000  clindamycin (CLEOCIN) IVPB 600 mg        600 mg 100 mL/hr over 30 Minutes Intravenous Every 6 hours 03/28/20 1804 03/29/20 1359   03/28/20 1256  clindamycin (CLEOCIN) 900 MG/50ML IVPB       Note to Pharmacy: [facility]   : cabinet override      03/28/20 1256 03/28/20 1355   03/28/20 0000  clindamycin (CLEOCIN) IVPB 900 mg  Status:  Discontinued        900 mg 100 mL/hr over 30 Minutes Intravenous See admin instructions 03/27/20 1907 03/28/20 1804         Subjective: Seen and examined.  Feels much better today.  Does not have any new complaint.  Apprehensive about working with PT due to pain.  She was reassured that she will be given pain medications and she was also counseled that working with PT is very important.  Objective: Vitals:   03/28/20 2259 03/29/20 0302 03/29/20 0720 03/29/20 0724  BP: 97/76 (!) 92/55 97/74 91/63   Pulse: 86 89 87 83  Resp: 18 15 16    Temp: 98 F (36.7 C) 98.1 F (36.7 C) 97.6 F (36.4 C)   TempSrc:      SpO2: 100% 100% 100% 99%  Weight:      Height:        Intake/Output Summary (Last 24 hours) at 03/29/2020 0829 Last data filed at 03/29/2020 0600 Gross per 24 hour  Intake 3213.52 ml  Output 775 ml  Net 2438.52 ml   Filed Weights   03/27/20 1723 03/28/20 1247  Weight: 59 kg 59 kg    Examination:  General exam: Appears calm and comfortable  Respiratory system: Clear to auscultation. Respiratory effort normal. Cardiovascular system: S1 & S2 heard, RRR. No JVD, murmurs, rubs, gallops or clicks. No pedal edema. Gastrointestinal system: Abdomen is nondistended, soft and nontender. No organomegaly or masses felt. Normal bowel sounds heard. Central nervous system: Alert and oriented. No focal neurological deficits. Skin: No rashes, lesions or ulcers.  Psychiatry: Judgement and insight appear normal. Mood & affect appropriate.     Data Reviewed: I have personally reviewed following labs and imaging studies  CBC: Recent Labs  Lab 03/27/20 1652 03/28/20 0438 03/29/20 0555  WBC 7.5 6.0 8.5  NEUTROABS 4.9  --   --   HGB 12.4 10.7* 9.9*  HCT 38.8 33.3* 31.0*  MCV 95.6 94.6 96.3  PLT 234 191 263   Basic Metabolic Panel: Recent Labs  Lab 03/27/20 1652 03/29/20 0555  NA 143 136  K 3.7 4.4  CL 106 104  CO2 26 23  GLUCOSE 140* 171*  BUN 18 24*  CREATININE 1.37* 1.42*  CALCIUM 9.7 9.4   GFR: Estimated Creatinine Clearance: 27 mL/min (A) (by C-G formula based on SCr of 1.42  mg/dL (H)). Liver Function Tests: Recent Labs  Lab 03/27/20 1652  AST 16  ALT 8  ALKPHOS 81  BILITOT 0.6  PROT 7.1  ALBUMIN 3.2*   No results for input(s): LIPASE, AMYLASE in the last 168 hours. No results for input(s): AMMONIA in the last 168 hours. Coagulation Profile: Recent Labs  Lab 03/27/20 1652  INR 1.1   Cardiac Enzymes: No results for input(s): CKTOTAL, CKMB, CKMBINDEX, TROPONINI in the last 168 hours. BNP (last 3 results) No results for input(s): PROBNP in the last 8760 hours. HbA1C: Recent Labs    03/28/20 0438  HGBA1C 5.9*   CBG: No results for input(s): GLUCAP in the last 168 hours. Lipid Profile: No results for input(s): CHOL, HDL, LDLCALC, TRIG, CHOLHDL, LDLDIRECT in the last 72 hours. Thyroid Function Tests: No results for input(s): TSH, T4TOTAL, FREET4, T3FREE, THYROIDAB in the last 72 hours. Anemia Panel: No results for input(s): VITAMINB12, FOLATE, FERRITIN, TIBC, IRON, RETICCTPCT in the last 72 hours. Sepsis Labs: No results for input(s): PROCALCITON, LATICACIDVEN in the last 168 hours.  Recent Results (from the past 240 hour(s))  Resp Panel by RT-PCR (Flu A&B, Covid) Nasopharyngeal Swab     Status: None   Collection Time: 03/27/20  8:51 PM   Specimen: Nasopharyngeal Swab; Nasopharyngeal(NP) swabs in vial transport medium  Result Value Ref Range Status   SARS Coronavirus 2 by RT PCR  NEGATIVE NEGATIVE Final    Comment: (NOTE) SARS-CoV-2 target nucleic acids are NOT DETECTED.  The SARS-CoV-2 RNA is generally detectable in upper respiratory specimens during the acute phase of infection. The lowest concentration of SARS-CoV-2 viral copies this assay can detect is 138 copies/mL. A negative result does not preclude SARS-Cov-2 infection and should not be used as the sole basis for treatment or other patient management decisions. A negative result may occur with  improper specimen collection/handling, submission of specimen other than nasopharyngeal swab, presence of viral mutation(s) within the areas targeted by this assay, and inadequate number of viral copies(<138 copies/mL). A negative result must be combined with clinical observations, patient history, and epidemiological information. The expected result is Negative.  Fact Sheet for Patients:  EntrepreneurPulse.com.au  Fact Sheet for Healthcare Providers:  IncredibleEmployment.be  This test is no t yet approved or cleared by the [place] FDA and  has been authorized for detection and/or diagnosis of SARS-CoV-2 by FDA under an Emergency Use Authorization (EUA). This EUA will remain  in effect (meaning this test can be used) for the duration of the COVID-19 declaration under Section 564(b)(1) of the Act, 21 U.S.C.section 360bbb-3(b)(1), unless the authorization is terminated  or revoked sooner.       Influenza A by PCR NEGATIVE NEGATIVE Final   Influenza B by PCR NEGATIVE NEGATIVE Final    Comment: (NOTE) The Xpert Xpress SARS-CoV-2/FLU/RSV plus assay is intended as an aid in the diagnosis of influenza from Nasopharyngeal swab specimens and should not be used as a sole basis for treatment. Nasal washings and aspirates are unacceptable for Xpert Xpress SARS-CoV-2/FLU/RSV testing.  Fact Sheet for Patients: EntrepreneurPulse.com.au  Fact Sheet for  Healthcare Providers: IncredibleEmployment.be  This test is not yet approved or cleared by the [place] FDA and has been authorized for detection and/or diagnosis of SARS-CoV-2 by FDA under an Emergency Use Authorization (EUA). This EUA will remain in effect (meaning this test can be used) for the duration of the COVID-19 declaration under Section 564(b)(1) of the Act, 21 U.S.C. section 360bbb-3(b)(1), unless the authorization is terminated  or revoked.  Performed at [facility], [address], [place], [place] [address]   Surgical PCR screen     Status: None   Collection Time: 03/28/20 11:13 AM   Specimen: Nasal Mucosa; Nasal Swab  Result Value Ref Range Status   MRSA, PCR NEGATIVE NEGATIVE Final   Staphylococcus aureus NEGATIVE NEGATIVE Final    Comment: (NOTE) The Xpert SA Assay (FDA approved for NASAL specimens in patients 22 years of age and older), is one component of a comprehensive surveillance program. It is not intended to diagnose infection nor to guide or monitor treatment. Performed at [facility], [address], [place], [place] [address]       Radiology Studies: Chest Portable 1 View  Result Date: 03/27/2020 CLINICAL DATA:  Preoperative evaluation. EXAM: PORTABLE CHEST 1 VIEW COMPARISON:  None. FINDINGS: Mild, diffuse, chronic appearing increased lung markings are seen. There is no evidence of acute infiltrate, pleural effusion or pneumothorax. The cardiac silhouette is mildly enlarged. There is moderate severity calcification of the aortic arch. Radiopaque surgical clips are seen within the right upper quadrant. The visualized skeletal structures are unremarkable. IMPRESSION: No evidence of acute or active cardiopulmonary disease. Electronically Signed   By: Virgina Norfolk M.D.   On: 03/27/2020 20:39   DG HIP OPERATIVE UNILAT W OR W/O PELVIS LEFT  Result Date: 03/28/2020 CLINICAL DATA:  Intramedullary nail  placement for proximal femur fracture EXAM: OPERATIVE LEFT HIP    2 VIEWS TECHNIQUE: Fluoroscopic spot image(s) were submitted for interpretation post-operatively. COMPARISON:  Pelvis and left hip radiographs March 27, 2020 FINDINGS: Frontal and lateral views were obtained. Initial images show fracture through the subtrochanteric region on the left with alignment near anatomic. Subsequent frontal and lateral view show screw and nail fixation through to the proximal femur fracture with alignment essentially anatomic after screw and nail placement. Screw tips noted in proximal femoral head. No dislocation. No new fracture evident. IMPRESSION: Screw and nail fixation through to proximal femur fracture with alignment essentially anatomic after fixation. Screw tips in proximal femoral head. No new fracture. No dislocation. Electronically Signed   By: Lowella Grip III M.D.   On: 03/28/2020 15:01   DG Hip Unilat With Pelvis 2-3 Views Left  Result Date: 03/27/2020 CLINICAL DATA:  Status post fall. EXAM: DG HIP (WITH OR WITHOUT PELVIS) 2-3V LEFT COMPARISON:  CT renal 11/03/2019. FINDINGS: Displaced transcervical neck versus intertrochanteric fracture of the left femur that extends to the proximal femoral shaft. No acute dislocation of the left hip. Frontal views of the right hip and pelvis are unremarkable. There is no evidence of severe arthropathy or aggressive appearing focal bone abnormality. Vascular calcifications. IMPRESSION: 1. Displaced transcervical neck versus intertrochanteric fracture of the left femur that extends to the proximal femoral shaft. 2. No dislocation of the left hip. 3. Frontal views of the pelvis and right hip are unremarkable. Electronically Signed   By: Iven Finn M.D.   On: 03/27/2020 18:18    Scheduled Meds: . acetaminophen  1,000 mg Oral Q8H  . bisacodyl  10 mg Rectal Once  . diltiazem  180 mg Oral Daily  . docusate sodium  100 mg Oral BID  . enoxaparin (LOVENOX)  injection  30 mg Subcutaneous Q24H  . mupirocin ointment  1 application Nasal BID   Continuous Infusions: . sodium chloride 75 mL/hr at 03/29/20 0600  . clindamycin (CLEOCIN) IV 600 mg (03/29/20 0810)  . methocarbamol (ROBAXIN) IV       LOS:  2 days   Time spent: 29-minute   Darliss Cheney, MD [facility]  03/29/2020, 8:29 AM   To contact the attending provider between 7A-7P or the covering provider during after hours 7P-7A, please log into the web site [email or web address].

## 2020-03-29 NOTE — Evaluation (Signed)
Occupational Therapy Evaluation Patient Details Name: Roberta Ross MRN: [id number] DOB: 02-11-1934 Today's Date: 03/29/2020    History of Present Illness Pt presents following a fall with L intertrochanteric hip fx. She is now s/p IMN for the L hip. The pt has PMHx significant for HTN, prediabetes, anxiety, HTN, RA, CKD stage IIIb, and atrial fibrillation.   Clinical Impression   Patient presenting with decreased I in self care, balance, functional mobility/transfer, endurance, and safety awareness.  Patient reports living at home alone PTA. She does not use AD for mobility but reports owning them. She uses freezer meals and does not cook secondary to rheumatoid arthritis and B hand ulnar drift noted. Patient currently able to bring LEs to EOB with mod A but declines EOB ,standing, and functional transfer this session secondary to pain. Pt able to perform grooming tasks with set up A to obtain needed items. She has made some modifications at home secondary to arthritis. She does not have a good understanding of functional deficits at this time and she does not have 24/7 assist. OT explained at length recommendation for short term rehab stay at discharge to address functional deficits.  Patient will benefit from acute OT to increase overall independence in the areas of ADLs, functional mobility, and safety awareness in order to safely discharge to next venue of care.    Follow Up Recommendations  SNF;Supervision/Assistance - 24 hour    Equipment Recommendations  Other (comment) (defer to next venue of care)       Precautions / Restrictions Precautions Precautions: Fall Restrictions Weight Bearing Restrictions: Yes LLE Weight Bearing: Weight bearing as tolerated      Mobility Bed Mobility Overal bed mobility: Needs Assistance Bed Mobility: Rolling Rolling: Mod assist   Supine to sit: Mod assist Sit to supine: Max assist   General bed mobility comments: Pt demonstrates getting LEs  to EOB with mod A but declines supine <>sit    Transfers Overall transfer level: Needs assistance Equipment used: Rolling walker (2 wheeled) Transfers: Sit to/from Stand Sit to Stand: Max assist         General transfer comment: Pt declined this session    Balance Overall balance assessment: Needs assistance Sitting-balance support: Bilateral upper extremity supported Sitting balance-Leahy Scale: Fair   Postural control: Right lateral lean Standing balance support: Bilateral upper extremity supported;During functional activity Standing balance-Leahy Scale: Poor        ADL either performed or assessed with clinical judgement   ADL Overall ADL's : Needs assistance/impaired     Grooming: Wash/dry hands;Wash/dry face         General ADL Comments: Pt declined transfers secondary to pain. Pt able to perform grooming tasks with set up A and likely set up A for UB dressing/bathing tasks.OT anticipates pt will need max-total A for LB self care     Vision Patient Visual Report: No change from baseline              Pertinent Vitals/Pain Pain Assessment: No/denies pain     Hand Dominance Right   Extremity/Trunk Assessment Upper Extremity Assessment Upper Extremity Assessment: Generalized weakness   Lower Extremity Assessment Lower Extremity Assessment: RLE deficits/detail LLE: Unable to fully assess due to pain LLE Sensation: WNL LLE Coordination: decreased gross motor   Cervical / Trunk Assessment Cervical / Trunk Assessment: Kyphotic   Communication Communication Communication: HOH   Cognition Arousal/Alertness: Awake/alert Behavior During Therapy: WFL for tasks assessed/performed Overall Cognitive Status: Impaired/Different from baseline Area of Impairment: Memory  Memory: Decreased short-term memory         General Comments: Pt guessing at date and month      Exercises General Exercises - Lower Extremity Hip ABduction/ADduction: AAROM;5  reps;Left;Supine   Shoulder Instructions      Home Living Family/patient expects to be discharged to:: Private residence Living Arrangements: Alone Available Help at Discharge: Family;Available PRN/intermittently Type of Home: House Home Access: Stairs to enter [employer] of Steps: 4 Entrance Stairs-Rails: Right Home Layout: One level     Bathroom Shower/Tub: Teacher, early years/pre: Standard Bathroom Accessibility: No   Home Equipment: None          Prior Functioning/Environment Level of Independence: Independent                 OT Problem List: Decreased strength;Decreased range of motion;Decreased activity tolerance;Decreased safety awareness;Impaired balance (sitting and/or standing);Decreased knowledge of use of DME or AE;Decreased knowledge of precautions      OT Treatment/Interventions: Self-care/ADL training;Therapeutic exercise;Therapeutic activities;DME and/or AE instruction;Patient/family education;Balance training;Energy conservation    OT Goals(Current goals can be found in the care plan section) Acute Rehab OT Goals Patient Stated Goal: to go home OT Goal Formulation: With patient Time For Goal Achievement: 04/12/20 Potential to Achieve Goals: Good  OT Frequency: Min 1X/week   Barriers to D/C: Other (comment)  pt does not have 24/7 assist if she returns home and needs higher level of care at this time          AM-PAC OT "6 Clicks" Daily Activity     Outcome Measure Help from another person eating meals?: A Little Help from another person taking care of personal grooming?: A Little Help from another person toileting, which includes using toliet, bedpan, or urinal?: Total Help from another person bathing (including washing, rinsing, drying)?: A Lot Help from another person to put on and taking off regular upper body clothing?: A Little Help from another person to put on and taking off regular lower body clothing?: Total 6  Click Score: 13   End of Session Equipment Utilized During Treatment: Oxygen Nurse Communication: Mobility status  Activity Tolerance: Patient limited by pain Patient left: in bed;with call bell/phone within reach;with bed alarm set;with family/visitor present  OT Visit Diagnosis: Unsteadiness on feet (R26.81);Muscle weakness (generalized) (M62.81);History of falling (Z91.81)                Time: 1010-1039 OT Time Calculation (min): 29 min Charges:  OT General Charges $OT Visit: 1 Visit OT Evaluation $OT Eval Low Complexity: 1 Low OT Treatments $Self Care/Home Management : 8-22 mins  Darleen Crocker, MS, OTR/L , CBIS ascom [phone]  03/29/20, 11:16 AM

## 2020-03-29 NOTE — Progress Notes (Signed)
  Subjective: 1 Day Post-Op Procedure(s) (LRB): INTRAMEDULLARY (IM) NAIL INTERTROCHANTRIC-Hip Fracture (Left) Patient reports pain as mild.   Patient is well, and has had no acute complaints or problems Plan is to go Rehab after hospital stay. Negative for chest pain and shortness of breath Fever: no Gastrointestinal: Negative for nausea and vomiting  Objective: Vital signs in last 24 hours: Temp:  [97.5 F (36.4 C)-98.7 F (37.1 C)] 98.1 F (36.7 C) (11/30 0302) Pulse Rate:  [38-125] 89 (11/30 0302) Resp:  [14-26] 15 (11/30 0302) BP: (73-126)/(55-88) 92/55 (11/30 0302) SpO2:  [86 %-100 %] 100 % (11/30 0302) Weight:  [59 kg] 59 kg (11/29 1247)  Intake/Output from previous day:  Intake/Output Summary (Last 24 hours) at 03/29/2020 0554 Last data filed at 03/29/2020 0400 Gross per 24 hour  Intake 3063.57 ml  Output 775 ml  Net 2288.57 ml    Intake/Output this shift: Total I/O In: 1513.6 [P.O.:720; I.V.:693.6; IV Piggyback:100] Out: 275 [Urine:275]  Labs: Recent Labs    03/27/20 1652 03/28/20 0438  HGB 12.4 10.7*   Recent Labs    03/27/20 1652 03/28/20 0438  WBC 7.5 6.0  RBC 4.06 3.52*  HCT 38.8 33.3*  PLT 234 191   Recent Labs    03/27/20 1652  NA 143  K 3.7  CL 106  CO2 26  BUN 18  CREATININE 1.37*  GLUCOSE 140*  CALCIUM 9.7   Recent Labs    03/27/20 1652  INR 1.1     EXAM General - Patient is Alert and Oriented Extremity - Neurovascular intact Sensation intact distally Dorsiflexion/Plantar flexion intact Compartment soft Dressing/Incision - clean, dry, no drainage Motor Function - intact, moving foot and toes well on exam.   Past Medical History:  Diagnosis Date  . Anemia    past hx  . Arthritis    hands and feet  . COPD (chronic obstructive pulmonary disease) ([place])   . Dysrhythmia    Pt reports slow HR and papatations  . Heart murmur    past hx  . Hypercholesteremia   . Hypertension   . Rheumatic fever    (x2) in past  .  Wears dentures    full upper    Assessment/Plan: 1 Day Post-Op Procedure(s) (LRB): INTRAMEDULLARY (IM) NAIL INTERTROCHANTRIC-Hip Fracture (Left) Principal Problem:   Closed fracture of femur, intertrochanteric, left, initial encounter ([facility]) Active Problems:   A-fib ([facility])   Stage 3b chronic kidney disease ([facility])   Essential hypertension   GAD (generalized anxiety disorder)   H/O rheumatoid arthritis   History of subarachnoid hemorrhage   Prediabetes   Pure hypercholesterolemia   COPD with chronic bronchitis ([facility])   Accidental fall   Preoperative clearance   Closed left hip fracture, initial encounter ([place])  Estimated body mass index is 21.64 kg/m as calculated from the following:   Height as of this encounter: 5\' 5"  (1.651 m).   Weight as of this encounter: 59 kg. Advance diet  Bowel management Follow-up at [facility] clinic orthopedics in 2 weeks after discharge.  DVT Prophylaxis - Lovenox, Foot Pumps and TED hose Weight-Bearing as tolerated to left leg  Reche Dixon, PA-C Orthopaedic Surgery 03/29/2020, 5:54 AM

## 2020-03-29 NOTE — Discharge Instructions (Signed)
INSTRUCTIONS AFTER Surgery  o Remove items at home which could result in a fall. This includes throw rugs or furniture in walking pathways o ICE to the affected joint every three hours while awake for 30 minutes at a time, for at least the first 3-5 days, and then as needed for pain and swelling.  Continue to use ice for pain and swelling. You may notice swelling that will progress down to the foot and ankle.  This is normal after surgery.  Elevate your leg when you are not up walking on it.   o Continue to use the breathing machine you got in the hospital (incentive spirometer) which will help keep your temperature down.  It is common for your temperature to cycle up and down following surgery, especially at night when you are not up moving around and exerting yourself.  The breathing machine keeps your lungs expanded and your temperature down.   DIET:  As you were doing prior to hospitalization, we recommend a well-balanced diet.  DRESSING / WOUND CARE / SHOWERING  Dressing change as needed.  Staples will be removed at [facility] orthopedics in 2 weeks.  No showering.  ACTIVITY  o Increase activity slowly as tolerated, but follow the weight bearing instructions below.   o No driving for 6 weeks or until further direction given by your physician.  You cannot drive while taking narcotics.  o No lifting or carrying greater than 10 lbs. until further directed by your surgeon. o Avoid periods of inactivity such as sitting longer than an hour when not asleep. This helps prevent blood clots.  o You may return to work once you are authorized by your doctor.     WEIGHT BEARING  Weightbearing as tolerated on the left   EXERCISES Gait training and ambulation training.  CONSTIPATION  Constipation is defined medically as fewer than three stools per week and severe constipation as less than one stool per week.  Even if you have a regular bowel pattern at home, your normal regimen is likely  to be disrupted due to multiple reasons following surgery.  Combination of anesthesia, postoperative narcotics, change in appetite and fluid intake all can affect your bowels.   YOU MUST use at least one of the following options; they are listed in order of increasing strength to get the job done.  They are all available over the counter, and you may need to use some, POSSIBLY even all of these options:    Drink plenty of fluids (prune juice may be helpful) and high fiber foods Colace 100 mg by mouth twice a day  Senokot for constipation as directed and as needed Dulcolax (bisacodyl), take with full glass of water  Miralax (polyethylene glycol) once or twice a day as needed.  If you have tried all these things and are unable to have a bowel movement in the first 3-4 days after surgery call either your surgeon or your primary doctor.    If you experience loose stools or diarrhea, hold the medications until you stool forms back up.  If your symptoms do not get better within 1 week or if they get worse, check with your doctor.  If you experience "the worst abdominal pain ever" or develop nausea or vomiting, please contact the office immediately for further recommendations for treatment.   ITCHING:  If you experience itching with your medications, try taking only a single pain pill, or even half a pain pill at a time.  You can  also use Benadryl over the counter for itching or also to help with sleep.   TED HOSE STOCKINGS:  Use stockings on both legs until for at least 2 weeks or as directed by physician office. They may be removed at night for sleeping.  MEDICATIONS:  See your medication summary on the "After Visit Summary" that nursing will review with you.  You may have some home medications which will be placed on hold until you complete the course of blood thinner medication.  It is important for you to complete the blood thinner medication as prescribed.  PRECAUTIONS:  If you experience chest  pain or shortness of breath - call 911 immediately for transfer to the hospital emergency department.   If you develop a fever greater that 101 F, purulent drainage from wound, increased redness or drainage from wound, foul odor from the wound/dressing, or calf pain - CONTACT YOUR SURGEON.                                                   FOLLOW-UP APPOINTMENTS:  If you do not already have a post-op appointment, please call the office for an appointment to be seen by your surgeon.  Guidelines for how soon to be seen are listed in your "After Visit Summary", but are typically between 1-4 weeks after surgery.  OTHER INSTRUCTIONS:     MAKE SURE YOU:  . Understand these instructions.  . Get help right away if you are not doing well or get worse.    Thank you for letting us be a part of your medical care team.  It is a privilege we respect greatly.  We hope these instructions will help you stay on track for a fast and full recovery!

## 2020-03-29 NOTE — Progress Notes (Signed)
  Chaplain On-Call responded to Order Requisition for prayer for patient.  Met patient with daughter Butch Penny at bedside.  Chaplain provided spiritual and emotional support and prayer for them.  [place] Scottie Stanish M.Div., [facility]

## 2020-03-30 DIAGNOSIS — I4891 Unspecified atrial fibrillation: Secondary | ICD-10-CM

## 2020-03-30 DIAGNOSIS — W19XXXA Unspecified fall, initial encounter: Secondary | ICD-10-CM

## 2020-03-30 DIAGNOSIS — S72142A Displaced intertrochanteric fracture of left femur, initial encounter for closed fracture: Principal | ICD-10-CM

## 2020-03-30 DIAGNOSIS — I10 Essential (primary) hypertension: Secondary | ICD-10-CM

## 2020-03-30 DIAGNOSIS — S72002A Fracture of unspecified part of neck of left femur, initial encounter for closed fracture: Secondary | ICD-10-CM

## 2020-03-30 LAB — BASIC METABOLIC PANEL
Anion gap: 8 (ref 5–15)
BUN: 33 mg/dL — ABNORMAL HIGH (ref 8–23)
CO2: 25 mmol/L (ref 22–32)
Calcium: 10.1 mg/dL (ref 8.9–10.3)
Chloride: 104 mmol/L (ref 98–111)
Creatinine, Ser: 1.28 mg/dL — ABNORMAL HIGH (ref 0.44–1.00)
GFR, Estimated: 42 mL/min — ABNORMAL LOW (ref >60)
Glucose, Bld: 149 mg/dL — ABNORMAL HIGH (ref 70–99)
Potassium: 4.7 mmol/L (ref 3.5–5.1)
Sodium: 137 mmol/L (ref 135–145)

## 2020-03-30 LAB — CBC
HCT: 30.5 % — ABNORMAL LOW (ref 36.0–46.0)
Hemoglobin: 9.8 g/dL — ABNORMAL LOW (ref 12.0–15.0)
MCH: 30.5 pg (ref 26.0–34.0)
MCHC: 32.1 g/dL (ref 30.0–36.0)
MCV: 95 fL (ref 80.0–100.0)
Platelets: 181 10*3/uL (ref 150–400)
RBC: 3.21 MIL/uL — ABNORMAL LOW (ref 3.87–5.11)
RDW: 13.2 % (ref 11.5–15.5)
WBC: 11.8 10*3/uL — ABNORMAL HIGH (ref 4.0–10.5)
nRBC: 0 % (ref 0.0–0.2)

## 2020-03-30 NOTE — Plan of Care (Signed)

## 2020-03-30 NOTE — NC FL2 (Signed)
[place] LEVEL OF CARE SCREENING TOOL     IDENTIFICATION  Patient Name: Roberta Ross Birthdate: 03/05/1934 Sex: female Admission Date (Current Location): 03/27/2020  [place] and [place] Number:  Engineering geologist and Address:  [facility], [address] [place], [place] [address]      Provider Number: [id number]  Attending Physician Name and Address:  Max Sane, MD  Relative Name and Phone Number:  Illene Regulus (Daughter) [phone] (Home Phone)    Current Level of Care: Hospital Recommended Level of Care: [place] Prior Approval Number:    Date Approved/Denied:   PASRR Number: [id number]  Discharge Plan: SNF    Current Diagnoses: Patient Active Problem List   Diagnosis Date Noted  . COPD with chronic bronchitis ([place]) 03/27/2020  . Closed fracture of femur, intertrochanteric, left, initial encounter ([place]) 03/27/2020  . Accidental fall 03/27/2020  . Preoperative clearance 03/27/2020  . Closed left hip fracture, initial encounter ([place]) 03/27/2020  . Simple chronic bronchitis ([place]) 09/01/2018  . A-fib ([place]) 09/16/2017  . History of subarachnoid hemorrhage 07/21/2017  . Prediabetes 03/21/2017  . GAD (generalized anxiety disorder) 02/01/2016  . Hypercalcemia 02/01/2016  . Essential hypertension 01/04/2015  . Stage 3b chronic kidney disease ([place]) 08/24/2014  . Pure hypercholesterolemia 02/09/2014  . H/O rheumatoid arthritis 10/20/2013    Orientation RESPIRATION BLADDER Height & Weight     Self, Time, Situation, Place  O2 Continent, External catheter Weight: 130 lb 1.1 oz (59 kg) Height:  5\' 5"  (165.1 cm)  BEHAVIORAL SYMPTOMS/MOOD NEUROLOGICAL BOWEL NUTRITION STATUS      Continent Diet (Regular diet)  AMBULATORY STATUS COMMUNICATION OF NEEDS Skin   Extensive Assist Verbally  (incisions: L thigh (honeycomb), L hip (honeycomb), L eye)                       Personal Care Assistance Level of  Assistance  Bathing, Feeding, Dressing Bathing Assistance: Maximum assistance Feeding assistance: Independent Dressing Assistance: Maximum assistance     Functional Limitations Info             SPECIAL CARE FACTORS FREQUENCY  PT (By licensed PT), OT (By licensed OT)     PT Frequency: 5 x/week OT Frequency: 5 x/week            Contractures      Additional Factors Info  Code Status, Allergies Code Status Info: full code Allergies Info: penicillins, methtrexate derivatives           Current Medications (03/30/2020):  This is the current hospital active medication list Current Facility-Administered Medications  Medication Dose Route Frequency Provider Last Rate Last Admin  . 0.9 %  sodium chloride infusion   Intravenous Continuous Leim Fabry, MD 75 mL/hr at 03/29/20 0600 Rate Verify at 03/29/20 0600  . apixaban (ELIQUIS) tablet 2.5 mg  2.5 mg Oral BID Corky Mull, MD   2.5 mg at 03/30/20 0806  . bisacodyl (DULCOLAX) suppository 10 mg  10 mg Rectal Daily PRN Leim Fabry, MD      . diltiazem (CARDIZEM CD) 24 hr capsule 180 mg  180 mg Oral Daily Leim Fabry, MD   180 mg at 03/30/20 0806  . docusate sodium (COLACE) capsule 100 mg  100 mg Oral BID Leim Fabry, MD   100 mg at 03/30/20 0806  . HYDROmorphone (DILAUDID) injection 0.25-0.5 mg  0.25-0.5 mg Intravenous Q2H PRN Leim Fabry, MD      . methocarbamol (ROBAXIN) tablet 500 mg  500 mg Oral Q6H PRN Leim Fabry, MD   500 mg at 03/29/20 0800   Or  . methocarbamol (ROBAXIN) 500 mg in dextrose 5 % 50 mL IVPB  500 mg Intravenous Q6H PRN Leim Fabry, MD      . metoCLOPramide (REGLAN) tablet 5-10 mg  5-10 mg Oral Q8H PRN Leim Fabry, MD       Or  . metoCLOPramide (REGLAN) injection 5-10 mg  5-10 mg Intravenous Q8H PRN Leim Fabry, MD      . mupirocin ointment (BACTROBAN) 2 % 1 application  1 application Nasal BID Leim Fabry, MD   1 application at 94/80/16 [phone]  . ondansetron (ZOFRAN) tablet 4 mg  4 mg Oral Q6H PRN  Leim Fabry, MD       Or  . ondansetron [facility]) injection 4 mg  4 mg Intravenous Q6H PRN Leim Fabry, MD      . oxyCODONE (Oxy IR/ROXICODONE) immediate release tablet 2.5-5 mg  2.5-5 mg Oral Q3H PRN Leim Fabry, MD      . oxyCODONE (Oxy IR/ROXICODONE) immediate release tablet 5-10 mg  5-10 mg Oral Q4H PRN Leim Fabry, MD   5 mg at 03/30/20 0806  . senna-docusate (Senokot-S) tablet 1 tablet  1 tablet Oral QHS PRN Leim Fabry, MD      . sodium phosphate (FLEET) 7-19 GM/118ML enema 1 enema  1 enema Rectal Once PRN Leim Fabry, MD      . traMADol Veatrice Bourbon) tablet 50 mg  50 mg Oral Q6H PRN Leim Fabry, MD      . zolpidem Lorrin Mais) tablet 5 mg  5 mg Oral QHS PRN Leim Fabry, MD   5 mg at 03/27/20 2147     Discharge Medications: Please see discharge summary for a list of discharge medications.  Relevant Imaging Results:  Relevant Lab Results:   Additional Information SS #: [place], LCSW

## 2020-03-30 NOTE — Plan of Care (Signed)
  Problem: Education: Goal: Knowledge of General Education information will improve Description: Including pain rating scale, medication(s)/side effects and non-pharmacologic comfort measures 03/30/2020 0004 by Levonne Hubert, RN Outcome: Progressing 03/30/2020 0003 by Levonne Hubert, RN Outcome: Progressing   Problem: Health Behavior/Discharge Planning: Goal: Ability to manage health-related needs will improve 03/30/2020 0004 by Levonne Hubert, RN Outcome: Progressing 03/30/2020 0003 by Levonne Hubert, RN Outcome: Progressing   Problem: Clinical Measurements: Goal: Ability to maintain clinical measurements within normal limits will improve 03/30/2020 0004 by Levonne Hubert, RN Outcome: Progressing 03/30/2020 0003 by Levonne Hubert, RN Outcome: Progressing Goal: Will remain free from infection 03/30/2020 0004 by Levonne Hubert, RN Outcome: Progressing 03/30/2020 0003 by Levonne Hubert, RN Outcome: Progressing Goal: Diagnostic test results will improve 03/30/2020 0004 by Levonne Hubert, RN Outcome: Progressing 03/30/2020 0003 by Levonne Hubert, RN Outcome: Progressing Goal: Respiratory complications will improve 03/30/2020 0004 by Levonne Hubert, RN Outcome: Progressing 03/30/2020 0003 by Levonne Hubert, RN Outcome: Progressing Goal: Cardiovascular complication will be avoided 03/30/2020 0004 by Levonne Hubert, RN Outcome: Progressing 03/30/2020 0003 by Levonne Hubert, RN Outcome: Progressing   Problem: Activity: Goal: Risk for activity intolerance will decrease 03/30/2020 0004 by Levonne Hubert, RN Outcome: Progressing 03/30/2020 0003 by Levonne Hubert, RN Outcome: Progressing   Problem: Nutrition: Goal: Adequate nutrition will be maintained 03/30/2020 0004 by Levonne Hubert, RN Outcome: Progressing 03/30/2020 0003 by Levonne Hubert, RN Outcome: Progressing   Problem: Coping: Goal: Level of anxiety will decrease 03/30/2020 0004 by Levonne Hubert, RN Outcome: Progressing 03/30/2020 0003 by Levonne Hubert, RN Outcome: Progressing   Problem: Elimination: Goal: Will not experience complications related to bowel motility 03/30/2020 0004 by Levonne Hubert, RN Outcome: Progressing 03/30/2020 0003 by Levonne Hubert, RN Outcome: Progressing Goal: Will not experience complications related to urinary retention 03/30/2020 0004 by Levonne Hubert, RN Outcome: Progressing 03/30/2020 0003 by Levonne Hubert, RN Outcome: Progressing   Problem: Pain Managment: Goal: General experience of comfort will improve 03/30/2020 0004 by Levonne Hubert, RN Outcome: Progressing 03/30/2020 0003 by Levonne Hubert, RN Outcome: Progressing   Problem: Safety: Goal: Ability to remain free from injury will improve 03/30/2020 0004 by Levonne Hubert, RN Outcome: Progressing 03/30/2020 0003 by Levonne Hubert, RN Outcome: Progressing   Problem: Skin Integrity: Goal: Risk for impaired skin integrity will decrease 03/30/2020 0004 by Levonne Hubert, RN Outcome: Progressing 03/30/2020 0003 by Levonne Hubert, RN Outcome: Progressing

## 2020-03-30 NOTE — Progress Notes (Signed)
PROGRESS NOTE    Roberta Ross  AJO:[id number] DOB: [birth year] DOA: 03/27/2020 PCP: Kirk Ruths, MD   Brief Narrative:  HPI: Roberta Ross is a 86 y.o. female with medical history significant for , HTN, prediabetes, anxiety, HTN, RA, CKD stage IIIb, atrial fibrillation not on anticoagulants and history of subarachnoid hemorrhage who presented to the emergency room following an accidental fall after she lost her balance while trying to get out of a chair falling backwards with immediate pain of the left hip and inability to bear weight. On arrival she was slightly tachycardic at 103, mild tachypnea with otherwise normal vitals.  CBC completely WNL, CMP significant for creatinine of 1.37 which is about her baseline for his CKD.  INR 1.1.  Left hip x-ray showing displaced transcervical neck versus intertrochanteric fracture of the left femur extending to the proximal femoral shaft. Underwent surgical repair by orthopedics.  Assessment & Plan:   Principal Problem:   Closed fracture of femur, intertrochanteric, left, initial encounter ([place]) Active Problems:   A-fib (HCC)   Stage 3b chronic kidney disease ([place])   Essential hypertension   GAD (generalized anxiety disorder)   H/O rheumatoid arthritis   History of subarachnoid hemorrhage   Prediabetes   Pure hypercholesterolemia   COPD with chronic bronchitis (HCC)   Accidental fall   Preoperative clearance   Closed left hip fracture, initial encounter (HCC)   Closed fracture of femur, intertrochanteric, left, initial encounter ([place]) due to accidental fall: Status post intramedullary nail.  Continue current pain management.  Management per orthopedics. PT OT recommends SNF  Acute blood loss anemia: Hemoglobin dropped about 3 g, postoperatively.  Still no indication of transfusion.  Hemoglobin 9.8 today, no bleeding from the surgical site.  A-fib (HCC) with mild RVR: Does not seem to be in RVR anymore.  Asymptomatic.  Not on any  anticoagulation due to previous history of brain bleed.  Continue diltiazem and monitor.    Stage 3b chronic kidney disease ([place]) -Renal function at baseline with creatinine of 1.37    Essential hypertension: Blood pressure slightly on the low side but she is completely asymptomatic. -Continue diltiazem 180 mg daily  Rheumatoid Arthritis with deformities --Chronic and stable -Not currently on any immunosuppressive medication    History of subarachnoid hemorrhage -No acute issues. -Patient did not hit her head.  No CT head and neck was indicated from the ER and based on physical exam    Prediabetes: Hemoglobin A1c 5.9    Pure hypercholesterolemia -Continue simvastatin  Chest pain: Noncardiac.  Likely due to her severe rheumatoid arthritis.   DVT prophylaxis: apixaban (ELIQUIS) tablet 2.5 mg Start: 03/29/20 2200 SCDs Start: 03/28/20 1805   Code Status: Full Code  Family Communication: None present at bedside.  Plan of care discussed with patient in length and she verbalized understanding and agreed with it.  Status is: Inpatient  Remains inpatient appropriate because:Inpatient level of care appropriate due to severity of illness   Dispo: The patient is from: Home              Anticipated d/c is to: SNF              Anticipated d/c date is: 1 to 2 days              Patient currently is not medically stable to d/c. waiting for SNF bed, auth started today per TOC    Estimated body mass index is 21.64 kg/m as calculated from the following:  Height as of this encounter: 5\' 5"  (1.651 m).   Weight as of this encounter: 59 kg.     Consultants:   Orthopedics  Procedures:   As above  Antimicrobials:  Anti-infectives (From admission, onward)   Start     Dose/Rate Route Frequency Ordered Stop   03/28/20 2000  clindamycin (CLEOCIN) IVPB 600 mg        600 mg 100 mL/hr over 30 Minutes Intravenous Every 6 hours 03/28/20 1804 03/29/20 0840   03/28/20 1256   clindamycin (CLEOCIN) 900 MG/50ML IVPB       Note to Pharmacy: [place]   : cabinet override      03/28/20 1256 03/28/20 1355   03/28/20 0000  clindamycin (CLEOCIN) IVPB 900 mg  Status:  Discontinued        900 mg 100 mL/hr over 30 Minutes Intravenous See admin instructions 03/27/20 1907 03/28/20 1804         Subjective: No new c/o. Hoping to get back home and stay as independent at home for as long as possible  Objective: Vitals:   03/29/20 2346 03/30/20 0504 03/30/20 0715 03/30/20 1250  BP: 106/75 103/73 106/87 103/71  Pulse: 96 (!) 50 61 62  Resp: 15 16 16 18   Temp: 97.9 F (36.6 C) 98 F (36.7 C) 98.2 F (36.8 C) (!) 97.5 F (36.4 C)  TempSrc: Oral  Oral   SpO2: 99% 99% 96% 99%  Weight:      Height:        Intake/Output Summary (Last 24 hours) at 03/30/2020 1457 Last data filed at 03/30/2020 1010 Gross per 24 hour  Intake 718.48 ml  Output 950 ml  Net -231.52 ml   Filed Weights   03/27/20 1723 03/28/20 1247  Weight: 59 kg 59 kg    Examination:  General exam: Appears calm and comfortable  Respiratory system: Clear to auscultation. Respiratory effort normal. Cardiovascular system: S1 & S2 heard, RRR. No JVD, murmurs, rubs, gallops or clicks. No pedal edema. Gastrointestinal system: Abdomen is nondistended, soft and nontender. No organomegaly or masses felt. Normal bowel sounds heard. Central nervous system: Alert and oriented. No focal neurological deficits. Skin: No rashes, lesions or ulcers.  Psychiatry: Judgement and insight appear normal. Mood & affect appropriate.    Data Reviewed: I have personally reviewed following labs and imaging studies  CBC: Recent Labs  Lab 03/27/20 1652 03/28/20 0438 03/29/20 0555 03/30/20 0404  WBC 7.5 6.0 8.5 11.8*  NEUTROABS 4.9  --   --   --   HGB 12.4 10.7* 9.9* 9.8*  HCT 38.8 33.3* 31.0* 30.5*  MCV 95.6 94.6 96.3 95.0  PLT 234 191 159 937   Basic Metabolic Panel: Recent Labs  Lab 03/27/20 1652  03/29/20 0555 03/30/20 0404  NA 143 136 137  K 3.7 4.4 4.7  CL 106 104 104  CO2 26 23 25   GLUCOSE 140* 171* 149*  BUN 18 24* 33*  CREATININE 1.37* 1.42* 1.28*  CALCIUM 9.7 9.4 10.1   GFR: Estimated Creatinine Clearance: 30 mL/min (A) (by C-G formula based on SCr of 1.28 mg/dL (H)). Liver Function Tests: Recent Labs  Lab 03/27/20 1652  AST 16  ALT 8  ALKPHOS 81  BILITOT 0.6  PROT 7.1  ALBUMIN 3.2*   No results for input(s): LIPASE, AMYLASE in the last 168 hours. No results for input(s): AMMONIA in the last 168 hours. Coagulation Profile: Recent Labs  Lab 03/27/20 1652  INR 1.1   Cardiac Enzymes: No results  for input(s): CKTOTAL, CKMB, CKMBINDEX, TROPONINI in the last 168 hours. BNP (last 3 results) No results for input(s): PROBNP in the last 8760 hours. HbA1C: Recent Labs    03/28/20 0438  HGBA1C 5.9*   CBG: No results for input(s): GLUCAP in the last 168 hours. Lipid Profile: No results for input(s): CHOL, HDL, LDLCALC, TRIG, CHOLHDL, LDLDIRECT in the last 72 hours. Thyroid Function Tests: No results for input(s): TSH, T4TOTAL, FREET4, T3FREE, THYROIDAB in the last 72 hours. Anemia Panel: No results for input(s): VITAMINB12, FOLATE, FERRITIN, TIBC, IRON, RETICCTPCT in the last 72 hours. Sepsis Labs: No results for input(s): PROCALCITON, LATICACIDVEN in the last 168 hours.  Recent Results (from the past 240 hour(s))  Resp Panel by RT-PCR (Flu A&B, Covid) Nasopharyngeal Swab     Status: None   Collection Time: 03/27/20  8:51 PM   Specimen: Nasopharyngeal Swab; Nasopharyngeal(NP) swabs in vial transport medium  Result Value Ref Range Status   SARS Coronavirus 2 by RT PCR NEGATIVE NEGATIVE Final    Comment: (NOTE) SARS-CoV-2 target nucleic acids are NOT DETECTED.  The SARS-CoV-2 RNA is generally detectable in upper respiratory specimens during the acute phase of infection. The lowest concentration of SARS-CoV-2 viral copies this assay can detect is 138  copies/mL. A negative result does not preclude SARS-Cov-2 infection and should not be used as the sole basis for treatment or other patient management decisions. A negative result may occur with  improper specimen collection/handling, submission of specimen other than nasopharyngeal swab, presence of viral mutation(s) within the areas targeted by this assay, and inadequate number of viral copies(<138 copies/mL). A negative result must be combined with clinical observations, patient history, and epidemiological information. The expected result is Negative.  Fact Sheet for Patients:  EntrepreneurPulse.com.au  Fact Sheet for Healthcare Providers:  IncredibleEmployment.be  This test is no t yet approved or cleared by the [place] FDA and  has been authorized for detection and/or diagnosis of SARS-CoV-2 by FDA under an Emergency Use Authorization (EUA). This EUA will remain  in effect (meaning this test can be used) for the duration of the COVID-19 declaration under Section 564(b)(1) of the Act, 21 U.S.C.section 360bbb-3(b)(1), unless the authorization is terminated  or revoked sooner.       Influenza A by PCR NEGATIVE NEGATIVE Final   Influenza B by PCR NEGATIVE NEGATIVE Final    Comment: (NOTE) The Xpert Xpress SARS-CoV-2/FLU/RSV plus assay is intended as an aid in the diagnosis of influenza from Nasopharyngeal swab specimens and should not be used as a sole basis for treatment. Nasal washings and aspirates are unacceptable for Xpert Xpress SARS-CoV-2/FLU/RSV testing.  Fact Sheet for Patients: EntrepreneurPulse.com.au  Fact Sheet for Healthcare Providers: IncredibleEmployment.be  This test is not yet approved or cleared by the [place] FDA and has been authorized for detection and/or diagnosis of SARS-CoV-2 by FDA under an Emergency Use Authorization (EUA). This EUA will remain in effect (meaning  this test can be used) for the duration of the COVID-19 declaration under Section 564(b)(1) of the Act, 21 U.S.C. section 360bbb-3(b)(1), unless the authorization is terminated or revoked.  Performed at [facility], [address], [place], [place] [address]   Surgical PCR screen     Status: None   Collection Time: 03/28/20 11:13 AM   Specimen: Nasal Mucosa; Nasal Swab  Result Value Ref Range Status   MRSA, PCR NEGATIVE NEGATIVE Final   Staphylococcus aureus NEGATIVE NEGATIVE Final    Comment: (NOTE) The Xpert SA Assay (  FDA approved for NASAL specimens in patients 59 years of age and older), is one component of a comprehensive surveillance program. It is not intended to diagnose infection nor to guide or monitor treatment. Performed at [facility], [address], [place], [place] [address]       Radiology Studies: No results found.  Scheduled Meds: . apixaban  2.5 mg Oral BID  . diltiazem  180 mg Oral Daily  . docusate sodium  100 mg Oral BID  . mupirocin ointment  1 application Nasal BID   Continuous Infusions: . sodium chloride 75 mL/hr at 03/29/20 0600  . methocarbamol (ROBAXIN) IV       LOS: 3 days   Time spent: 29-minute   Max Sane, MD [facility]  03/30/2020, 2:57 PM   To contact the attending provider between 7A-7P or the covering provider during after hours 7P-7A, please log into the web site [email or web address].

## 2020-03-30 NOTE — Progress Notes (Signed)
Physical Therapy Treatment Patient Details Name: Roberta Ross MRN: [id number] DOB: 1933-05-18 Today's Date: 03/30/2020    History of Present Illness Pt presents following a fall with L intertrochanteric hip fx. She is now s/p IMN for the L hip. The pt has PMHx significant for HTN, prediabetes, anxiety, HTN, RA, CKD stage IIIb, and atrial fibrillation.    PT Comments    Pt was long sitting in bed upon arriving. She agrees to PT session with max encouragement. Reports no pain at rest however yells out frequently during session with anticipation of pain with movements, even with non operative LE movements. She was able to progress to EOB with increased time and max assist. Stood 2 x total with max assist. Was able to pivot to recliner with max vcs for technique and increased time required. Overall pt is progressing slowly towards PT goals. High recommend DC to SNF to address deficits while assisting pt to PLOF. At conclusion of session, pt was sitting in recliner with call bell in reach and RN aware of pt's abilities.       Follow Up Recommendations  SNF     Equipment Recommendations  Other (comment) (defer to next level of care)    Recommendations for Other Services       Precautions / Restrictions Precautions Precautions: Fall Restrictions Weight Bearing Restrictions: Yes LLE Weight Bearing: Weight bearing as tolerated    Mobility  Bed Mobility Overal bed mobility: Needs Assistance Bed Mobility: Rolling Rolling: Mod assist   Supine to sit: Mod assist;Max assist     General bed mobility comments: Alot of increased time 2/2 to pain. pt yells out due to anxiety moreso than pain. yells even with not operative leg movements. sat EOB x ~ 5 minutes prior to transfer  Transfers Overall transfer level: Needs assistance Equipment used: None (therapist support under bilateral UEs) Transfers: Sit to/from [facility] Sit to Stand: Max assist Stand pivot transfers:  Max assist       General transfer comment: pt was able to STS 1 x EOB prior to stand pivot to recliner. Increased time + max vcs/assistance. pt limited by anxiety moreso than strength.   Ambulation/Gait    General Gait Details: unable to progress at this time    Balance Overall balance assessment: Needs assistance Sitting-balance support: Bilateral upper extremity supported Sitting balance-Leahy Scale: Fair     Standing balance support: Bilateral upper extremity supported;During functional activity Standing balance-Leahy Scale: Poor       Cognition Arousal/Alertness: Awake/alert Behavior During Therapy: WFL for tasks assessed/performed Overall Cognitive Status: Impaired/Different from baseline Area of Impairment: Memory;Safety/judgement;Awareness      Memory: Decreased short-term memory   Safety/Judgement: Decreased awareness of deficits     General Comments: Pt was alert and oriented x 2. does have slight cognition deficits with poor insight of deficits             Pertinent Vitals/Pain Pain Assessment: 0-10 Pain Score: 7  Pain Location: L hip Pain Descriptors / Indicators: Guarding;Grimacing;Operative site guarding;Spasm;Throbbing Pain Intervention(s): Limited activity within patient's tolerance;Monitored during session;Premedicated before session;Repositioned           PT Goals (current goals can now be found in the care plan section) Acute Rehab PT Goals Patient Stated Goal: " I know I need rehab before going home." Progress towards PT goals: Progressing toward goals    Frequency    BID      PT Plan Current plan remains appropriate  AM-PAC PT "6 Clicks" Mobility   Outcome Measure  Help needed turning from your back to your side while in a flat bed without using bedrails?: A Little Help needed moving from lying on your back to sitting on the side of a flat bed without using bedrails?: A Lot Help needed moving to and from a bed to a chair  (including a wheelchair)?: A Lot Help needed standing up from a chair using your arms (e.g., wheelchair or bedside chair)?: A Lot Help needed to walk in hospital room?: Total Help needed climbing 3-5 steps with a railing? : Total 6 Click Score: 11    End of Session Equipment Utilized During Treatment: Gait belt Activity Tolerance: Patient limited by pain;Other (comment);Patient limited by fatigue (limited by anxiety with movements) Patient left: in chair;with call bell/phone within reach;with chair alarm set;with SCD's reapplied Nurse Communication: Mobility status PT Visit Diagnosis: Unsteadiness on feet (R26.81);Muscle weakness (generalized) (M62.81);Difficulty in walking, not elsewhere classified (R26.2);Pain Pain - Right/Left: Left Pain - part of body: Hip     Time: 8768-1157 PT Time Calculation (min) (ACUTE ONLY): 31 min  Charges:  $Therapeutic Activity: 23-37 mins                     Julaine Fusi PTA 03/30/20, 11:07 AM

## 2020-03-30 NOTE — TOC Initial Note (Addendum)
Transition of Care [facility]) - Initial/Assessment Note    Patient Details  Name: Roberta Ross MRN: [id number] Date of Birth: 03-15-1934  [facility] CM/SW Contact:    Magnus Ivan, LCSW Phone Number: 03/30/2020, 10:11 AM  Clinical Narrative:         CSW spoke with patient. Patient lives alone and drives herself to appointments. PCP is Dr. Ouida Sills. Pharmacy is [facility] in [place]. No HH or SNF history. Patient reported she has DME that is about 84 years old that belonged to her mother, does not have any DME of her own. Patient is agreeable to SNF rehab at discharge, denied having a SNF preference, just wants to go somewhere local. She said it is ok to discuss this with her daughter as well if needed. CSW starting SNF work up and insurance authorization today.         Insurance Programmer, systems #: [id number]   Expected Discharge Plan: Skilled Nursing Facility Barriers to Discharge: Continued Medical Work up   Patient Goals and CMS Choice Patient states their goals for this hospitalization and ongoing recovery are:: SNF rehab CMS Medicare.gov Compare Post Acute Care list provided to:: Patient Choice offered to / list presented to : Patient  Expected Discharge Plan and Services Expected Discharge Plan: [place] arrangements for the past 2 months: Single Family Home                                      Prior Living Arrangements/Services Living arrangements for the past 2 months: Single Family Home Lives with:: Self Patient language and need for interpreter reviewed:: Yes Do you feel safe going back to the place where you live?: Yes      Need for Family Participation in Patient Care: Yes (Comment) Care giver support system in place?: Yes (comment)   Criminal Activity/Legal Involvement Pertinent to Current Situation/Hospitalization: No - Comment as needed  Activities of Daily Living Home Assistive Devices/Equipment: Cane (specify  quad or straight), Dentures (specify type) ADL Screening (condition at time of admission) Patient's cognitive ability adequate to safely complete daily activities?: Yes Is the patient deaf or have difficulty hearing?: No Does the patient have difficulty seeing, even when wearing glasses/contacts?: No Does the patient have difficulty concentrating, remembering, or making decisions?: No Patient able to express need for assistance with ADLs?: Yes Does the patient have difficulty dressing or bathing?: No Independently performs ADLs?: No Communication: Independent Dressing (OT): Needs assistance Is this a change from baseline?: Change from baseline, expected to last >3 days Grooming: Needs assistance Is this a change from baseline?: Change from baseline, expected to last >3 days Feeding: Needs assistance Is this a change from baseline?: Change from baseline, expected to last >3 days Bathing: Needs assistance Is this a change from baseline?: Change from baseline, expected to last >3 days Toileting: Needs assistance Is this a change from baseline?: Change from baseline, expected to last >3days In/Out Bed: Dependent Is this a change from baseline?: Change from baseline, expected to last >3 days Walks in Home: Needs assistance Is this a change from baseline?: Change from baseline, expected to last >3 days Does the patient have difficulty walking or climbing stairs?: Yes Weakness of Legs: Both Weakness of Arms/Hands: None  Permission Sought/Granted Permission sought to share information with : Facility Sport and exercise psychologist, Family Supports Permission granted to share information with :  Yes, Verbal Permission Granted     Permission granted to share info w AGENCY: SNFs  Permission granted to share info w Relationship: daughter Butch Penny     Emotional Assessment       Orientation: : Oriented to Situation, Oriented to  Time, Oriented to Place, Oriented to Self Alcohol / Substance Use: Not  Applicable Psych Involvement: No (comment)  Admission diagnosis:  Preop cardiovascular exam [Z01.810] Closed fracture of left hip, initial encounter ([place]) [S72.002A] Closed left hip fracture, initial encounter [facility]) [S72.002A] Patient Active Problem List   Diagnosis Date Noted  . COPD with chronic bronchitis ([place]) 03/27/2020  . Closed fracture of femur, intertrochanteric, left, initial encounter ([place]) 03/27/2020  . Accidental fall 03/27/2020  . Preoperative clearance 03/27/2020  . Closed left hip fracture, initial encounter ([place]) 03/27/2020  . Simple chronic bronchitis ([place]) 09/01/2018  . A-fib ([place]) 09/16/2017  . History of subarachnoid hemorrhage 07/21/2017  . Prediabetes 03/21/2017  . GAD (generalized anxiety disorder) 02/01/2016  . Hypercalcemia 02/01/2016  . Essential hypertension 01/04/2015  . Stage 3b chronic kidney disease ([place]) 08/24/2014  . Pure hypercholesterolemia 02/09/2014  . H/O rheumatoid arthritis 10/20/2013   PCP:  Kirk Ruths, MD Pharmacy:   [facility] [place], [place] [facility] [address] AT [address] [place] [address] Phone: [phone] Fax: [phone]     Social Determinants of Health (SDOH) Interventions    Readmission Risk Interventions No flowsheet data found.

## 2020-03-30 NOTE — Progress Notes (Signed)
  Subjective: 2 Days Post-Op Procedure(s) (LRB): INTRAMEDULLARY (IM) NAIL INTERTROCHANTRIC-Hip Fracture (Left) Patient reports pain as mild.   Patient is well, and has had no acute complaints or problems Plan is to go Rehab after hospital stay. Negative for chest pain and shortness of breath Fever: no Gastrointestinal: Negative for nausea and vomiting  Objective: Vital signs in last 24 hours: Temp:  [97.6 F (36.4 C)-98 F (36.7 C)] 98 F (36.7 C) (12/01 0504) Pulse Rate:  [50-96] 50 (12/01 0504) Resp:  [15-20] 16 (12/01 0504) BP: (91-111)/(63-82) 103/73 (12/01 0504) SpO2:  [83 %-100 %] 99 % (12/01 0504)  Intake/Output from previous day:  Intake/Output Summary (Last 24 hours) at 03/30/2020 0629 Last data filed at 03/29/2020 2343 Gross per 24 hour  Intake 598.48 ml  Output 1050 ml  Net -451.52 ml    Intake/Output this shift: Total I/O In: -  Out: 650 [Urine:650]  Labs: Recent Labs    03/27/20 1652 03/28/20 0438 03/29/20 0555 03/30/20 0404  HGB 12.4 10.7* 9.9* 9.8*   Recent Labs    03/29/20 0555 03/30/20 0404  WBC 8.5 11.8*  RBC 3.22* 3.21*  HCT 31.0* 30.5*  PLT 159 181   Recent Labs    03/29/20 0555 03/30/20 0404  NA 136 137  K 4.4 4.7  CL 104 104  CO2 23 25  BUN 24* 33*  CREATININE 1.42* 1.28*  GLUCOSE 171* 149*  CALCIUM 9.4 10.1   Recent Labs    03/27/20 1652  INR 1.1     EXAM General - Patient is Alert and Oriented Extremity - Neurovascular intact Sensation intact distally Dorsiflexion/Plantar flexion intact Compartment soft Dressing/Incision - clean, dry, scant superior drainage Motor Function - intact, moving foot and toes well on exam.   Past Medical History:  Diagnosis Date  . Anemia    past hx  . Arthritis    hands and feet  . COPD (chronic obstructive pulmonary disease) ([place])   . Dysrhythmia    Pt reports slow HR and papatations  . Heart murmur    past hx  . Hypercholesteremia   . Hypertension   . Rheumatic fever     (x2) in past  . Wears dentures    full upper    Assessment/Plan: 2 Days Post-Op Procedure(s) (LRB): INTRAMEDULLARY (IM) NAIL INTERTROCHANTRIC-Hip Fracture (Left) Principal Problem:   Closed fracture of femur, intertrochanteric, left, initial encounter ([facility]) Active Problems:   A-fib ([facility])   Stage 3b chronic kidney disease ([facility])   Essential hypertension   GAD (generalized anxiety disorder)   H/O rheumatoid arthritis   History of subarachnoid hemorrhage   Prediabetes   Pure hypercholesterolemia   COPD with chronic bronchitis ([facility])   Accidental fall   Preoperative clearance   Closed left hip fracture, initial encounter ([place])  Estimated body mass index is 21.64 kg/m as calculated from the following:   Height as of this encounter: 5\' 5"  (1.651 m).   Weight as of this encounter: 59 kg. Advance diet  Bowel management Follow-up at [facility] clinic orthopedics in 2 weeks after discharge.  DVT Prophylaxis - Lovenox, Foot Pumps and TED hose Weight-Bearing as tolerated to left leg  Roberta Dixon, PA-C Orthopaedic Surgery 03/30/2020, 6:29 AM

## 2020-03-30 NOTE — Care Management Important Message (Signed)
Important Message  Patient Details  Name: Roberta Ross MRN: [id number] Date of Birth: 19-Oct-1933   Medicare Important Message Given:  Yes     [name] 03/30/2020, 11:02 AM

## 2020-03-30 NOTE — Progress Notes (Signed)
Physical Therapy Treatment Patient Details Name: Roberta Ross MRN: [id number] DOB: 05/03/1933 Today's Date: 03/30/2020    History of Present Illness Pt presents following a fall with L intertrochanteric hip fx. She is now s/p IMN for the L hip. The pt has PMHx significant for HTN, prediabetes, anxiety, HTN, RA, CKD stage IIIb, and atrial fibrillation.    PT Comments    Pt was sitting in recliner upon arriving. She agrees to PT session and getting back to bed with max encouragement.  Pain continues to be limiting factor. She was able to stand pivot towards R with max assist + increased time. Constant vcs for technique and safety. Required max assist to reposition to supine from short sit EOB. Pt was agreeable to performing there ex in bed. See exercises listed below. Overall pt is progressing slowly due to pain. She will benefit from SNF at DC to address deficits while assisting pt to PLOF.    Follow Up Recommendations  SNF     Equipment Recommendations   (defer to next level of care)       Precautions / Restrictions Precautions Precautions: Fall Restrictions Weight Bearing Restrictions: Yes LLE Weight Bearing: Weight bearing as tolerated    Mobility  Bed Mobility Overal bed mobility: Needs Assistance Bed Mobility: Rolling       Sit to supine: Max assist   General bed mobility comments: Max assist to return to supine in bed from EOB short sit. Pt yells out in pain throughout  Transfers Overall transfer level: Needs assistance Equipment used: None (therapist supporting under BUEs) Transfers: Sit to/from [facility] Sit to Stand: Max assist Stand pivot transfers: Max assist       General transfer comment: max assist to stand and pivot from recliner to EOB.   Ambulation/Gait    General Gait Details: unable/unsafe/unwilling to trial    Balance Overall balance assessment: Needs assistance Sitting-balance support: Bilateral upper extremity  supported Sitting balance-Leahy Scale: Fair     Standing balance support: Bilateral upper extremity supported;During functional activity Standing balance-Leahy Scale: Poor         Cognition Arousal/Alertness: Awake/alert Behavior During Therapy: WFL for tasks assessed/performed Overall Cognitive Status: No family/caregiver present to determine baseline cognitive functioning Area of Impairment: Memory;Safety/judgement;Awareness      Memory: Decreased short-term memory   Safety/Judgement: Decreased awareness of deficits     General Comments: Pt was alert and oriented x 2. Does have slight cognition deficits with poor insight of deficits      Exercises General Exercises - Lower Extremity Ankle Circles/Pumps: AROM;Both;20 reps Quad Sets: AROM;10 reps;Both Gluteal Sets: AROM;10 reps Heel Slides: Left;10 reps;Supine;AROM Hip ABduction/ADduction: AROM;10 reps;Supine Straight Leg Raises: AAROM;Left;10 reps;Supine        Pertinent Vitals/Pain Pain Assessment: 0-10 Pain Score: 9  Pain Location: L hip Pain Descriptors / Indicators: Guarding;Grimacing;Operative site guarding;Spasm;Throbbing Pain Intervention(s): Limited activity within patient's tolerance;Monitored during session;Premedicated before session;Repositioned           PT Goals (current goals can now be found in the care plan section) Acute Rehab PT Goals Patient Stated Goal: " I know I need rehab before going home." Progress towards PT goals: Not progressing toward goals - comment (pain limiting)    Frequency    BID      PT Plan Current plan remains appropriate       AM-PAC PT "6 Clicks" Mobility   Outcome Measure  Help needed turning from your back to your side while in a flat bed  without using bedrails?: A Little Help needed moving from lying on your back to sitting on the side of a flat bed without using bedrails?: A Lot Help needed moving to and from a bed to a chair (including a wheelchair)?: A  Lot Help needed standing up from a chair using your arms (e.g., wheelchair or bedside chair)?: A Lot Help needed to walk in hospital room?: Total Help needed climbing 3-5 steps with a railing? : Total 6 Click Score: 11    End of Session Equipment Utilized During Treatment: Gait belt Activity Tolerance: Patient limited by pain Patient left: in chair;with call bell/phone within reach;with chair alarm set;with SCD's reapplied Nurse Communication: Mobility status PT Visit Diagnosis: Unsteadiness on feet (R26.81);Muscle weakness (generalized) (M62.81);Difficulty in walking, not elsewhere classified (R26.2);Pain Pain - Right/Left: Left Pain - part of body: Hip     Time: 1440-1503 PT Time Calculation (min) (ACUTE ONLY): 23 min  Charges:  $Therapeutic Exercise: 8-22 mins $Therapeutic Activity: 8-22 mins                     Julaine Fusi PTA 03/30/20, 3:23 PM

## 2020-03-31 DIAGNOSIS — J449 Chronic obstructive pulmonary disease, unspecified: Secondary | ICD-10-CM

## 2020-03-31 LAB — CBC
HCT: 29.2 % — ABNORMAL LOW (ref 36.0–46.0)
Hemoglobin: 9.6 g/dL — ABNORMAL LOW (ref 12.0–15.0)
MCH: 31.2 pg (ref 26.0–34.0)
MCHC: 32.9 g/dL (ref 30.0–36.0)
MCV: 94.8 fL (ref 80.0–100.0)
Platelets: 193 10*3/uL (ref 150–400)
RBC: 3.08 MIL/uL — ABNORMAL LOW (ref 3.87–5.11)
RDW: 13.6 % (ref 11.5–15.5)
WBC: 8.4 10*3/uL (ref 4.0–10.5)
nRBC: 0 % (ref 0.0–0.2)

## 2020-03-31 LAB — BASIC METABOLIC PANEL
Anion gap: 6 (ref 5–15)
BUN: 36 mg/dL — ABNORMAL HIGH (ref 8–23)
CO2: 24 mmol/L (ref 22–32)
Calcium: 9.8 mg/dL (ref 8.9–10.3)
Chloride: 106 mmol/L (ref 98–111)
Creatinine, Ser: 1.26 mg/dL — ABNORMAL HIGH (ref 0.44–1.00)
GFR, Estimated: 42 mL/min — ABNORMAL LOW (ref >60)
Glucose, Bld: 135 mg/dL — ABNORMAL HIGH (ref 70–99)
Potassium: 4.8 mmol/L (ref 3.5–5.1)
Sodium: 136 mmol/L (ref 135–145)

## 2020-03-31 LAB — SARS CORONAVIRUS 2 BY RT PCR (HOSPITAL ORDER, PERFORMED IN ~~LOC~~ HOSPITAL LAB): SARS Coronavirus 2: NEGATIVE

## 2020-03-31 MED ORDER — APIXABAN 2.5 MG PO TABS
2.5000 mg | ORAL_TABLET | Freq: Two times a day (BID) | ORAL | 0 refills | Status: DC
Start: 2020-03-31 — End: 2022-01-23

## 2020-03-31 NOTE — Progress Notes (Signed)
Physical Therapy Treatment Patient Details Name: Roberta Ross MRN: [id number] DOB: May 09, 1933 Today's Date: 03/31/2020    History of Present Illness Pt presents following a fall with L intertrochanteric hip fx. She is now s/p IMN for the L hip. The pt has PMHx significant for HTN, prediabetes, anxiety, HTN, RA, CKD stage IIIb, and atrial fibrillation.    PT Comments    Pt was long sitting in bed upon arriving. She agrees to PT session and is cooperative throughout. Still C/O severe pain with movements but was able to tolerate session with much less yelling out today. Pt is alert but has cognition deficits that make her have poor insight of deficits.She was able to exit L side of bed with mod assist. Stand from elevated bed height with mod assist and take ~ 5 steps with very slow antalgic step to pattern. Reached out to pt's daughter for update and discussed rehab recommendation at DC. Pt will benefit form SNF at DC to address deficits and improve safe functional abilities.       Follow Up Recommendations  SNF     Equipment Recommendations  Other (comment) (defer to next level of care)       Precautions / Restrictions Precautions Precautions: Fall Restrictions Weight Bearing Restrictions: Yes LLE Weight Bearing: Weight bearing as tolerated    Mobility  Bed Mobility Overal bed mobility: Needs Assistance       Supine to sit: Mod assist;HOB elevated (alot of increased time to perform)     General bed mobility comments: Pt was able to long sit to short sit EOB  with less assistance however continues to require increased time and max cueing  Transfers Overall transfer level: Needs assistance Equipment used: Rolling walker (2 wheeled) Transfers: Sit to/from Stand Sit to Stand: Mod assist;From elevated surface         General transfer comment: Mod A to stand from elevated bed height with vcs for handplacement, and overall improved safety  Ambulation/Gait Ambulation/Gait  assistance: Mod assist Gait Distance (Feet): 5 Feet Assistive device: Rolling walker (2 wheeled) Gait Pattern/deviations: Step-to pattern Gait velocity: decreased   General Gait Details: pt was able to ambulate 5 ft with RW with very slow antalgic step to pattern with vcs for sequencing throughout. step by step cues.       Balance Overall balance assessment: Needs assistance Sitting-balance support: Bilateral upper extremity supported Sitting balance-Leahy Scale: Fair     Standing balance support: Bilateral upper extremity supported;During functional activity Standing balance-Leahy Scale: Poor       Cognition Arousal/Alertness: Awake/alert Behavior During Therapy: WFL for tasks assessed/performed Overall Cognitive Status: No family/caregiver present to determine baseline cognitive functioning Area of Impairment: Memory;Safety/judgement;Awareness                     Memory: Decreased short-term memory   Safety/Judgement: Decreased awareness of deficits     General Comments: Pt is A but does have cognition deficits             Pertinent Vitals/Pain Pain Assessment: 0-10 Pain Score: 8  (with movements) Pain Location: L hip Pain Descriptors / Indicators: Guarding;Grimacing;Operative site guarding;Spasm;Throbbing Pain Intervention(s): Limited activity within patient's tolerance;Monitored during session;Premedicated before session;Repositioned           PT Goals (current goals can now be found in the care plan section) Acute Rehab PT Goals Patient Stated Goal: " I want to get better." Progress towards PT goals: Progressing toward goals    Frequency  BID      PT Plan Current plan remains appropriate       AM-PAC PT "6 Clicks" Mobility   Outcome Measure  Help needed turning from your back to your side while in a flat bed without using bedrails?: A Little Help needed moving from lying on your back to sitting on the side of a flat bed without  using bedrails?: A Little         6 Click Score: 6    End of Session Equipment Utilized During Treatment: Gait belt Activity Tolerance: Patient limited by pain Patient left: in chair;with call bell/phone within reach;with chair alarm set;with SCD's reapplied Nurse Communication: Mobility status PT Visit Diagnosis: Unsteadiness on feet (R26.81);Muscle weakness (generalized) (M62.81);Difficulty in walking, not elsewhere classified (R26.2);Pain Pain - Right/Left: Left Pain - part of body: Hip     Time: 1030-1055 PT Time Calculation (min) (ACUTE ONLY): 25 min  Charges:  $Therapeutic Activity: 23-37 mins                     Julaine Fusi PTA 03/31/20, 1:17 PM

## 2020-03-31 NOTE — Progress Notes (Signed)
  Subjective: 3 Days Post-Op Procedure(s) (LRB): INTRAMEDULLARY (IM) NAIL INTERTROCHANTRIC-Hip Fracture (Left) Patient reports pain as mild.   Patient is well, and has had no acute complaints or problems Plan is to go Rehab after hospital stay. Negative for chest pain and shortness of breath Fever: no Gastrointestinal: Negative for nausea and vomiting  Objective: Vital signs in last 24 hours: Temp:  [97.5 F (36.4 C)-98.2 F (36.8 C)] 97.8 F (36.6 C) (12/02 0409) Pulse Rate:  [61-100] 77 (12/02 0409) Resp:  [16-20] 17 (12/02 0409) BP: (102-110)/(69-87) 102/69 (12/02 0409) SpO2:  [96 %-99 %] 99 % (12/02 0409)  Intake/Output from previous day:  Intake/Output Summary (Last 24 hours) at 03/31/2020 9924 Last data filed at 03/31/2020 0355 Gross per 24 hour  Intake 720 ml  Output 2600 ml  Net -1880 ml    Intake/Output this shift: Total I/O In: -  Out: 1600 [Urine:1600]  Labs: Recent Labs    03/29/20 0555 03/30/20 0404 03/31/20 0339  HGB 9.9* 9.8* 9.6*   Recent Labs    03/30/20 0404 03/31/20 0339  WBC 11.8* 8.4  RBC 3.21* 3.08*  HCT 30.5* 29.2*  PLT 181 193   Recent Labs    03/30/20 0404 03/31/20 0339  NA 137 136  K 4.7 4.8  CL 104 106  CO2 25 24  BUN 33* 36*  CREATININE 1.28* 1.26*  GLUCOSE 149* 135*  CALCIUM 10.1 9.8   No results for input(s): LABPT, INR in the last 72 hours.   EXAM General - Patient is Alert and Oriented Extremity - Neurovascular intact Sensation intact distally Dorsiflexion/Plantar flexion intact Compartment soft Dressing/Incision - clean, dry, scant superior drainage Motor Function - intact, moving foot and toes well on exam.  Minimal ambulation with physical therapy  Past Medical History:  Diagnosis Date  . Anemia    past hx  . Arthritis    hands and feet  . COPD (chronic obstructive pulmonary disease) ([place])   . Dysrhythmia    Pt reports slow HR and papatations  . Heart murmur    past hx  . Hypercholesteremia   .  Hypertension   . Rheumatic fever    (x2) in past  . Wears dentures    full upper    Assessment/Plan: 3 Days Post-Op Procedure(s) (LRB): INTRAMEDULLARY (IM) NAIL INTERTROCHANTRIC-Hip Fracture (Left) Principal Problem:   Closed fracture of femur, intertrochanteric, left, initial encounter (HCC) Active Problems:   A-fib (HCC)   Stage 3b chronic kidney disease (HCC)   Essential hypertension   GAD (generalized anxiety disorder)   H/O rheumatoid arthritis   History of subarachnoid hemorrhage   Prediabetes   Pure hypercholesterolemia   COPD with chronic bronchitis (HCC)   Accidental fall   Preoperative clearance   Closed left hip fracture, initial encounter ([place])  Estimated body mass index is 21.64 kg/m as calculated from the following:   Height as of this encounter: 5\' 5"  (1.651 m).   Weight as of this encounter: 59 kg. Advance diet  Bowel management Follow-up at [facility] clinic orthopedics in 2 weeks after discharge.  DVT Prophylaxis - Lovenox, Foot Pumps and TED hose Weight-Bearing as tolerated to left leg  Reche Dixon, PA-C Orthopaedic Surgery 03/31/2020, 6:32 AM

## 2020-03-31 NOTE — TOC Progression Note (Addendum)
Transition of Care [facility]) - Progression Note    Patient Details  Name: Roberta Ross MRN: [id number] Date of Birth: [birth year]  Transition [facility]) CM/SW [place], LCSW Phone Number: 03/31/2020, 8:49 AM  Clinical Narrative:   Spoke to patient to discuss bed offers. She deferred to her daughter. Called daughter, left a voicemail requesting a return call.  10:30- Spoke to patient's daughter Butch Penny. Provided update. She would like [facility]. [place] to update them on SNF being chosen. Spoke to [place], [place] approved Authorization # [id number]. Reached out to Seth Bake at [facility], waiting for confirmation they can take patient today.     Expected Discharge Plan: [place] Barriers to Discharge: Continued Medical Work up  Expected Discharge Plan and Services Expected Discharge Plan: [place] arrangements for the past 2 months: Single Family Home                                       Social Determinants of Health (SDOH) Interventions    Readmission Risk Interventions No flowsheet data found.

## 2020-03-31 NOTE — Discharge Summary (Addendum)
[place] at [place] NAME: Roberta Ross    MR#:  [id number]  DATE OF BIRTH:  [birth year]  DATE OF ADMISSION:  03/27/2020   ADMITTING PHYSICIAN: Athena Masse, MD  DATE OF DISCHARGE: 03/31/2020  PRIMARY CARE PHYSICIAN: Kirk Ruths, MD   ADMISSION DIAGNOSIS:  Preop cardiovascular exam [Z01.810] Closed fracture of left hip, initial encounter ([place]) [S72.002A] Closed left hip fracture, initial encounter ([place]) [S72.002A] DISCHARGE DIAGNOSIS:  Principal Problem:   Closed fracture of femur, intertrochanteric, left, initial encounter ([place]) Active Problems:   A-fib ([place])   Stage 3b chronic kidney disease ([place])   Essential hypertension   GAD (generalized anxiety disorder)   H/O rheumatoid arthritis   History of subarachnoid hemorrhage   Prediabetes   Pure hypercholesterolemia   COPD with chronic bronchitis (HCC)   Accidental fall   Preoperative clearance   Closed left hip fracture, initial encounter ([place])  SECONDARY DIAGNOSIS:   Past Medical History:  Diagnosis Date  . Anemia    past hx  . Arthritis    hands and feet  . COPD (chronic obstructive pulmonary disease) ([place])   . Dysrhythmia    Pt reports slow HR and papatations  . Heart murmur    past hx  . Hypercholesteremia   . Hypertension   . Rheumatic fever    (x2) in past  . Wears dentures    full upper   HOSPITAL COURSE:  Roberta Ross a 86 y.o.femalewith medical history significant for, HTN, prediabetes, anxiety, HTN,RA,CKD stage IIIb,atrial fibrillation not on anticoagulants and history of subarachnoid hemorrhage admitted s/p accidental fall after she lost her balance while trying to get out of a chair falling backwards with immediate pain of the left hip and inability to bear weight. On arrival,Left hip x-ray showing displaced transcervical neck versus intertrochanteric fracture of the left femur extending to the proximal femoral shaft. Underwent surgical repair by  orthopedics.  Closed fracture of femur, intertrochanteric, left, initial encounter ([place]) due to accidental fall: Status post intramedullary nail on 11/29. progressing well  Acute blood loss anemia: Hemoglobin dropped about 3 g, postoperatively.  didn't require transfusion as Hemoglobin stable since then. 9.6 today, no bleeding from the surgical site.  A-fib (HCC) with mild RVR: Does not seem to be in RVR anymore.  Asymptomatic.  Not on any anticoagulation due to previous history of brain bleed.  Continue diltiazem and monitor.    Stage 3b chronic kidney disease ([place]) -Renal function at baseline with creatinine of 1.37    Essential hypertension: Blood pressure slightly on the low side but she is completely asymptomatic. -Continue diltiazem 180 mg daily  Rheumatoid Arthritis with deformities --Chronic and stable -Not currently on any immunosuppressive medication    History of subarachnoid hemorrhage -No acute issues. -Patient did not hit her head.  No CT head and neck was indicated from the ER and based on physical exam    Prediabetes: Hemoglobin A1c 5.9    Pure hypercholesterolemia -Continue simvastatin  Chest pain: Noncardiac.  Likely due to her severe rheumatoid arthritis   DISCHARGE CONDITIONS:  stable CONSULTS OBTAINED:  Treatment Team:  Corky Mull, MD DRUG ALLERGIES:   Allergies  Allergen Reactions  . Penicillins Other (See Comments)    Internal swelling including throat  . Methotrexate Derivatives Nausea Only   DISCHARGE MEDICATIONS:   Allergies as of 03/31/2020      Reactions   Penicillins Other (See Comments)   Internal swelling including throat   Methotrexate Derivatives  Nausea Only      Medication List    TAKE these medications   Acetaminophen 8 Hour 650 MG CR tablet Generic drug: acetaminophen Take 650 mg by mouth 2 (two) times daily.   apixaban 2.5 MG Tabs tablet Commonly known as: ELIQUIS Take 1 tablet (2.5 mg total) by mouth 2 (two)  times daily for 14 days.   diltiazem 120 MG tablet Commonly known as: CARDIZEM Take 120 mg by mouth 1 day or 1 dose.   furosemide 20 MG tablet Commonly known as: LASIX Take 20 mg by mouth daily.   oxyCODONE 5 MG immediate release tablet Commonly known as: Oxy IR/ROXICODONE Take 0.5-1 tablets (2.5-5 mg total) by mouth every 3 (three) hours as needed for moderate pain or severe pain (pain score 4-6).   simvastatin 20 MG tablet Commonly known as: ZOCOR Take by mouth.   traMADol 50 MG tablet Commonly known as: ULTRAM Take 1 tablet (50 mg total) by mouth every 6 (six) hours as needed for moderate pain.      DISCHARGE INSTRUCTIONS:   DIET:  Regular diet DISCHARGE CONDITION:  Stable ACTIVITY:  Activity as tolerated - Weight-Bearing as tolerated to left leg OXYGEN:  Home Oxygen: No.  Oxygen Delivery: room air DISCHARGE LOCATION:  nursing home   If you experience worsening of your admission symptoms, develop shortness of breath, life threatening emergency, suicidal or homicidal thoughts you must seek medical attention immediately by calling 911 or calling your MD immediately  if symptoms less severe.  You Must read complete instructions/literature along with all the possible adverse reactions/side effects for all the Medicines you take and that have been prescribed to you. Take any new Medicines after you have completely understood and accpet all the possible adverse reactions/side effects.   Please note  You were cared for by a hospitalist during your hospital stay. If you have any questions about your discharge medications or the care you received while you were in the hospital after you are discharged, you can call the unit and asked to speak with the hospitalist on call if the hospitalist that took care of you is not available. Once you are discharged, your primary care physician will handle any further medical issues. Please note that NO REFILLS for any discharge medications  will be authorized once you are discharged, as it is imperative that you return to your primary care physician (or establish a relationship with a primary care physician if you do not have one) for your aftercare needs so that they can reassess your need for medications and monitor your lab values.    On the day of Discharge:  VITAL SIGNS:  Blood pressure 135/79, pulse 78, temperature 98.1 F (36.7 C), temperature source Oral, resp. rate 17, height 5\' 5"  (1.651 m), weight 59 kg, SpO2 99 %. PHYSICAL EXAMINATION:  GENERAL:  86 y.o.-year-old patient lying in the bed with no acute distress.  EYES: Pupils equal, round, reactive to light and accommodation. No scleral icterus. Extraocular muscles intact.  HEENT: Head atraumatic, normocephalic. Oropharynx and nasopharynx clear.  NECK:  Supple, no jugular venous distention. No thyroid enlargement, no tenderness.  LUNGS: Normal breath sounds bilaterally, no wheezing, rales,rhonchi or crepitation. No use of accessory muscles of respiration.  CARDIOVASCULAR: S1, S2 normal. No murmurs, rubs, or gallops.  ABDOMEN: Soft, non-tender, non-distended. Bowel sounds present. No organomegaly or mass.  EXTREMITIES: No pedal edema, cyanosis, or clubbing.  NEUROLOGIC: Cranial nerves II through XII are intact. Muscle strength 5/5 in all extremities.  Sensation intact. Gait not checked.  PSYCHIATRIC: The patient is alert and oriented x 3.  SKIN: No obvious rash, lesion, or ulcer.  DATA REVIEW:   CBC Recent Labs  Lab 03/31/20 0339  WBC 8.4  HGB 9.6*  HCT 29.2*  PLT 193    Chemistries  Recent Labs  Lab 03/27/20 1652 03/29/20 0555 03/31/20 0339  NA 143   < > 136  K 3.7   < > 4.8  CL 106   < > 106  CO2 26   < > 24  GLUCOSE 140*   < > 135*  BUN 18   < > 36*  CREATININE 1.37*   < > 1.26*  CALCIUM 9.7   < > 9.8  AST 16  --   --   ALT 8  --   --   ALKPHOS 81  --   --   BILITOT 0.6  --   --    < > = values in this interval not displayed.      Outpatient follow-up  Contact information for follow-up providers    Reche Dixon, [place]. Schedule an appointment as soon as possible for a visit in 2 week(s).   Specialty: Orthopedic Surgery Why: For staple removal and left femur x-rays Contact information: [address] [place] [address] [phone]        Kirk Ruths, MD. Schedule an appointment as soon as possible for a visit in 1 week(s).   Specialty: Internal Medicine Contact information: [place] [address] [phone]            Contact information for after-discharge care    Destination    [facility] .   Service: Skilled Nursing Contact information: [place] [phone]                   Management plans discussed with the patient, family and they are in agreement.  CODE STATUS: Full Code   TOTAL TIME TAKING CARE OF THIS PATIENT: 45 minutes.    Max Sane M.D on 03/31/2020 at 12:47 PM  [facility]   CC: Primary care physician; Kirk Ruths, MD   Note: This dictation was prepared with Dragon dictation along with smaller phrase technology. Any transcriptional errors that result from this process are unintentional.

## 2020-03-31 NOTE — TOC Transition Note (Signed)
Transition of Care [facility]) - CM/SW Discharge Note   Patient Details  Name: Roberta Ross MRN: [id number] Date of Birth: [birth year]  Transition of Care [facility]) CM/SW Contact:  Magnus Ivan, LCSW Phone Number: 03/31/2020, 12:02 PM   Clinical Narrative:   Patient to discharge to [facility] today, Room 109. Confirmed with Seth Bake at [facility]. CSW updated MD, RN, patient/family. Asked RN to call report and MD to submit DC Summary. Medical Necessity Form and Face Sheet placed in Discharge Packet by patient chart. EMS transport arranged for 2:00 pick up time through [facility]. No other needs identified prior to discharge.     Final next level of care: Skilled Nursing Facility Barriers to Discharge: Barriers Resolved   Patient Goals and CMS Choice Patient states their goals for this hospitalization and ongoing recovery are:: SNF rehab CMS Medicare.gov Compare Post Acute Care list provided to:: Patient Choice offered to / list presented to : Patient, Adult Children  Discharge Placement              Patient chooses bed at: [facility] Patient to be transferred to facility by: EMS Name of family member notified: Butch Penny Patient and family notified of of transfer: 03/31/20  Discharge Plan and Services                                     Social Determinants of Health (SDOH) Interventions     Readmission Risk Interventions No flowsheet data found.

## 2020-03-31 NOTE — Progress Notes (Signed)
Pt discharged to [facility] by EMS transport. No IV at time of discharge. Called report to [facility]. EMS transported pt from [facility] to [facility].

## 2020-04-04 DIAGNOSIS — J449 Chronic obstructive pulmonary disease, unspecified: Secondary | ICD-10-CM

## 2020-04-04 DIAGNOSIS — S7292XA Unspecified fracture of left femur, initial encounter for closed fracture: Secondary | ICD-10-CM | POA: Diagnosis not present

## 2020-04-04 DIAGNOSIS — K219 Gastro-esophageal reflux disease without esophagitis: Secondary | ICD-10-CM

## 2020-04-04 DIAGNOSIS — N183 Chronic kidney disease, stage 3 unspecified: Secondary | ICD-10-CM | POA: Diagnosis not present

## 2020-04-04 DIAGNOSIS — I10 Essential (primary) hypertension: Secondary | ICD-10-CM | POA: Diagnosis not present

## 2020-04-04 DIAGNOSIS — I4819 Other persistent atrial fibrillation: Secondary | ICD-10-CM

## 2020-04-04 DIAGNOSIS — F39 Unspecified mood [affective] disorder: Secondary | ICD-10-CM

## 2020-04-07 ENCOUNTER — Encounter: Payer: Self-pay | Admitting: Radiology

## 2020-04-07 ENCOUNTER — Emergency Department: Payer: Medicare Other

## 2020-04-07 ENCOUNTER — Other Ambulatory Visit: Payer: Self-pay

## 2020-04-07 ENCOUNTER — Emergency Department
Admission: EM | Admit: 2020-04-07 | Discharge: 2020-04-07 | Disposition: A | Discharge status: Home / Self Care | Payer: Medicare Other | Attending: Emergency Medicine | Admitting: Emergency Medicine

## 2020-04-07 DIAGNOSIS — R1031 Right lower quadrant pain: Secondary | ICD-10-CM | POA: Insufficient documentation

## 2020-04-07 DIAGNOSIS — R112 Nausea with vomiting, unspecified: Secondary | ICD-10-CM | POA: Insufficient documentation

## 2020-04-07 DIAGNOSIS — Z9049 Acquired absence of other specified parts of digestive tract: Secondary | ICD-10-CM | POA: Diagnosis not present

## 2020-04-07 DIAGNOSIS — Z7901 Long term (current) use of anticoagulants: Secondary | ICD-10-CM | POA: Insufficient documentation

## 2020-04-07 DIAGNOSIS — R1032 Left lower quadrant pain: Secondary | ICD-10-CM | POA: Diagnosis not present

## 2020-04-07 DIAGNOSIS — J449 Chronic obstructive pulmonary disease, unspecified: Secondary | ICD-10-CM | POA: Diagnosis not present

## 2020-04-07 DIAGNOSIS — R1030 Lower abdominal pain, unspecified: Secondary | ICD-10-CM

## 2020-04-07 DIAGNOSIS — F17200 Nicotine dependence, unspecified, uncomplicated: Secondary | ICD-10-CM | POA: Diagnosis not present

## 2020-04-07 DIAGNOSIS — I129 Hypertensive chronic kidney disease with stage 1 through stage 4 chronic kidney disease, or unspecified chronic kidney disease: Secondary | ICD-10-CM | POA: Insufficient documentation

## 2020-04-07 DIAGNOSIS — Z79899 Other long term (current) drug therapy: Secondary | ICD-10-CM | POA: Insufficient documentation

## 2020-04-07 DIAGNOSIS — N1832 Chronic kidney disease, stage 3b: Secondary | ICD-10-CM | POA: Diagnosis not present

## 2020-04-07 LAB — CBC
HCT: 37.4 % (ref 36.0–46.0)
Hemoglobin: 12.3 g/dL (ref 12.0–15.0)
MCH: 30.5 pg (ref 26.0–34.0)
MCHC: 32.9 g/dL (ref 30.0–36.0)
MCV: 92.8 fL (ref 80.0–100.0)
Platelets: 336 10*3/uL (ref 150–400)
RBC: 4.03 MIL/uL (ref 3.87–5.11)
RDW: 13.7 % (ref 11.5–15.5)
WBC: 13.2 10*3/uL — ABNORMAL HIGH (ref 4.0–10.5)
nRBC: 0 % (ref 0.0–0.2)

## 2020-04-07 LAB — COMPREHENSIVE METABOLIC PANEL
ALT: 17 U/L (ref 0–44)
AST: 21 U/L (ref 15–41)
Albumin: 3 g/dL — ABNORMAL LOW (ref 3.5–5.0)
Alkaline Phosphatase: 102 U/L (ref 38–126)
Anion gap: 11 (ref 5–15)
BUN: 25 mg/dL — ABNORMAL HIGH (ref 8–23)
CO2: 28 mmol/L (ref 22–32)
Calcium: 12.1 mg/dL — ABNORMAL HIGH (ref 8.9–10.3)
Chloride: 95 mmol/L — ABNORMAL LOW (ref 98–111)
Creatinine, Ser: 1.32 mg/dL — ABNORMAL HIGH (ref 0.44–1.00)
GFR, Estimated: 40 mL/min — ABNORMAL LOW (ref >60)
Glucose, Bld: 168 mg/dL — ABNORMAL HIGH (ref 70–99)
Potassium: 4.2 mmol/L (ref 3.5–5.1)
Sodium: 134 mmol/L — ABNORMAL LOW (ref 135–145)
Total Bilirubin: 1.3 mg/dL — ABNORMAL HIGH (ref 0.3–1.2)
Total Protein: 7.1 g/dL (ref 6.5–8.1)

## 2020-04-07 LAB — URINALYSIS, COMPLETE (UACMP) WITH MICROSCOPIC
Bacteria, UA: NONE SEEN
Bilirubin Urine: NEGATIVE
Glucose, UA: NEGATIVE mg/dL
Hgb urine dipstick: NEGATIVE
Ketones, ur: NEGATIVE mg/dL
Leukocytes,Ua: NEGATIVE
Nitrite: NEGATIVE
Protein, ur: NEGATIVE mg/dL
Specific Gravity, Urine: 1.029 (ref 1.005–1.030)
pH: 7 (ref 5.0–8.0)

## 2020-04-07 LAB — LIPASE, BLOOD: Lipase: 34 U/L (ref 11–51)

## 2020-04-07 MED ORDER — ONDANSETRON HCL 4 MG/2ML IJ SOLN
4.0000 mg | Freq: Once | INTRAMUSCULAR | Status: AC
  Administered 2020-04-07: 4 mg via INTRAVENOUS
  Filled 2020-04-07: qty 2

## 2020-04-07 MED ORDER — IOHEXOL 300 MG/ML  SOLN
80.0000 mL | Freq: Once | INTRAMUSCULAR | Status: AC | PRN
  Administered 2020-04-07: 80 mL via INTRAVENOUS

## 2020-04-07 MED ORDER — MORPHINE SULFATE (PF) 4 MG/ML IV SOLN
4.0000 mg | Freq: Once | INTRAVENOUS | Status: AC
  Administered 2020-04-07: 4 mg via INTRAVENOUS
  Filled 2020-04-07: qty 1

## 2020-04-07 MED ORDER — SODIUM CHLORIDE 0.9 % IV BOLUS
500.0000 mL | Freq: Once | INTRAVENOUS | Status: AC
  Administered 2020-04-07: 500 mL via INTRAVENOUS

## 2020-04-07 NOTE — ED Provider Notes (Signed)
[facility] Emergency Department Provider Note ____________________________________________   Event Date/Time   First MD Initiated Contact with Patient 04/07/20 0809     (approximate)  I have reviewed the triage vital signs and the nursing notes.   HISTORY  Chief Complaint Abdominal Pain  Level 5 caveat: History of present illness limited altered mental status, disorganized historian  HPI Roberta Ross is a 86 y.o. female with PMH as noted below and status post a recent hospitalization for a hip fracture presents from her rehab facility with abdominal pain and vomiting since last night.  The patient states that she vomited earlier.  She reports waves of abdominal pain, but otherwise is just repeatedly asking for water.  She otherwise does not know why she is here.  Past Medical History:  Diagnosis Date  . Anemia    past hx  . Arthritis    hands and feet  . COPD (chronic obstructive pulmonary disease) ([facility])   . Dysrhythmia    Pt reports slow HR and papatations  . Heart murmur    past hx  . Hypercholesteremia   . Hypertension   . Rheumatic fever    (x2) in past  . Wears dentures    full upper    Patient Active Problem List   Diagnosis Date Noted  . COPD with chronic bronchitis ([place]) 03/27/2020  . Closed fracture of femur, intertrochanteric, left, initial encounter ([place]) 03/27/2020  . Accidental fall 03/27/2020  . Preoperative clearance 03/27/2020  . Closed left hip fracture, initial encounter ([place]) 03/27/2020  . Simple chronic bronchitis ([place]) 09/01/2018  . A-fib ([place]) 09/16/2017  . History of subarachnoid hemorrhage 07/21/2017  . Prediabetes 03/21/2017  . GAD (generalized anxiety disorder) 02/01/2016  . Hypercalcemia 02/01/2016  . Essential hypertension 01/04/2015  . Stage 3b chronic kidney disease ([place]) 08/24/2014  . Pure hypercholesterolemia 02/09/2014  . H/O rheumatoid arthritis 10/20/2013    Past Surgical History:  Procedure  Laterality Date  . ABDOMINAL HYSTERECTOMY    . APPENDECTOMY    . CATARACT EXTRACTION W/PHACO Left 01/10/2015   Procedure: CATARACT EXTRACTION PHACO AND INTRAOCULAR LENS PLACEMENT (IOC);  Surgeon: Ronnell Freshwater, MD;  Location: [place];  Service: Ophthalmology;  Laterality: Left;  . INTRAMEDULLARY (IM) NAIL INTERTROCHANTERIC Left 03/28/2020   Procedure: INTRAMEDULLARY (IM) NAIL INTERTROCHANTRIC-Hip Fracture;  Surgeon: Leim Fabry, MD;  Location: [facility];  Service: Orthopedics;  Laterality: Left;  . TONSILLECTOMY      Prior to Admission medications   Medication Sig Start Date End Date Taking? Authorizing Provider  acetaminophen (TYLENOL) 325 MG tablet Take 650 mg by mouth every 4 (four) hours as needed for mild pain.   Yes [provider]  acetaminophen (TYLENOL) 650 MG CR tablet Take 650 mg by mouth 2 (two) times daily.   Yes [provider]  apixaban (ELIQUIS) 2.5 MG TABS tablet Take 1 tablet (2.5 mg total) by mouth 2 (two) times daily for 14 days. 03/31/20 04/14/20 Yes Max Sane, MD  diltiazem (CARDIZEM) 120 MG tablet Take 120 mg by mouth daily. Hold if systolic BP< or = 607   Yes [provider]  furosemide (LASIX) 20 MG tablet Take 20 mg by mouth daily.   Yes [provider]  ipratropium-albuterol (DUONEB) 0.5-2.5 (3) MG/3ML SOLN Take 3 mLs by nebulization every 8 (eight) hours as needed (dyspnea).   Yes [provider]  magnesium hydroxide (MILK OF MAGNESIA) 400 MG/5ML suspension Take 30 mLs by mouth every 12 (twelve) hours as needed  for mild constipation.   Yes [provider]  oxyCODONE (OXY IR/ROXICODONE) 5 MG immediate release tablet Take 0.5-1 tablets (2.5-5 mg total) by mouth every 3 (three) hours as needed for moderate pain or severe pain (pain score 4-6). 03/29/20  Yes Reche Dixon, PA-C  sennosides-docusate sodium (SENOKOT-S) 8.6-50 MG tablet Take 2 tablets by mouth 2 (two) times daily.   Yes [provider]  simvastatin (ZOCOR) 20 MG tablet Take 20 mg by mouth daily. 06/29/15  Yes [provider]  traMADol (ULTRAM) 50 MG tablet Take 1 tablet (50 mg total) by mouth every 6 (six) hours as needed for moderate pain. 03/29/20  Yes Reche Dixon, PA-C  triamcinolone (KENALOG) 0.1 % Apply 1 application topically every 8 (eight) hours as needed (itching).   Yes [provider]    Allergies Penicillins and Methotrexate derivatives  No family history on file.  Social History Social History   Tobacco Use  . Smoking status: Current Every Day Smoker    Packs/day: 0.25    Years: 60.00    Pack years: 15.00  . Smokeless tobacco: Never Used  Substance Use Topics  . Alcohol use: No    Review of Systems Level 5 caveat: Unable to obtain review of systems due to altered mental status/disorganized historian    ____________________________________________   PHYSICAL EXAM:  VITAL SIGNS: ED Triage Vitals [04/07/20 0515]  Enc Vitals Group     BP 121/82     Pulse Rate 97     Resp 18     Temp 99 F (37.2 C)     Temp Source Oral     SpO2 97 %     Weight 150 lb (68 kg)     Height      Head Circumference      Peak Flow      Pain Score 10     Pain Loc      Pain Edu?      Excl. in [place]?     Constitutional: Alert and oriented, but appears somewhat confused.  Uncomfortable appearing but in no acute distress. Eyes: Conjunctivae are normal.  No scleral icterus. Head: Atraumatic. Nose: No congestion/rhinnorhea. Mouth/Throat: Mucous membranes are dry.   Neck: Normal range of motion.  Cardiovascular: Normal rate, irregular rhythm.  Good peripheral circulation. Respiratory: Normal respiratory effort.  No retractions.  Gastrointestinal: Soft with moderate bilateral lower quadrant tenderness.  Mild distention. Genitourinary: No flank tenderness. Musculoskeletal: No lower extremity edema.  Extremities warm and well perfused.  Neurologic:  Normal speech and language. No  gross focal neurologic deficits are appreciated.  Skin:  Skin is warm and dry. No rash noted. Psychiatric: Calm and redirectable, but behaving somewhat inappropriately such as trying to bite RN.  ____________________________________________   Reva Bores (all labs ordered are listed, but only abnormal results are displayed)  Labs Reviewed  CBC - Abnormal; Notable for the following components:      Result Value   WBC 13.2 (*)    All other components within normal limits  COMPREHENSIVE METABOLIC PANEL - Abnormal; Notable for the following components:   Sodium 134 (*)    Chloride 95 (*)    Glucose, Bld 168 (*)    BUN 25 (*)    Creatinine, Ser 1.32 (*)    Calcium 12.1 (*)    Albumin 3.0 (*)    Total Bilirubin 1.3 (*)    GFR, Estimated 40 (*)    All other components within normal limits  URINALYSIS, COMPLETE (UACMP)  WITH MICROSCOPIC - Abnormal; Notable for the following components:   Color, Urine YELLOW (*)    APPearance CLEAR (*)    All other components within normal limits  LIPASE, BLOOD   ____________________________________________  EKG  ED ECG REPORT I, Arta Silence, the attending physician, personally viewed and interpreted this ECG.  Date: 04/07/2020 EKG Time: 08 13 Rate: 108 Rhythm: Atrial fibrillation QRS Axis: normal Intervals: normal ST/T Wave abnormalities: Nonspecific T wave abnormalities Narrative Interpretation: no evidence of acute ischemia  ____________________________________________  RADIOLOGY  XR abdomen interpreted by me shows nonspecific bowel gas pattern with no acute abnormality CT abdomen: Stool burden consistent with constipation; dilated CBD status post cholecystectomy  ____________________________________________   PROCEDURES  Procedure(s) performed: No  Procedures  Critical Care performed: No ____________________________________________   INITIAL IMPRESSION / ASSESSMENT AND PLAN / ED COURSE  Pertinent labs & imaging results  that were available during my care of the patient were reviewed by me and considered in my medical decision making (see chart for details).  86 year old female with PMH as noted above including atrial fibrillation and COPD as well as recent hospitalization for hip fracture presents with abdominal pain and vomiting.  I reviewed the past medical records in [place].  The patient was admitted at the end of last month and discharged a week ago.  She had a left proximal femur/hip fracture which was repaired by orthopedics.  She had a bout of anemia but did not require transfusion.  She is not on anticoagulation due to prior history of an intracranial hemorrhage.  On exam today, the patient is overall relatively well-appearing.  Her vital signs are normal except for intermittent mild tachycardia.  The abdomen is soft but mildly distended and somewhat tender in bilateral lower quadrants.  She appears to be having waves of intermittent pain or cramps.  The patient is alert and oriented, but does appear somewhat confused, difficult to obtain a clear history from, and sometimes behaving inappropriately such as trying to bite staff.  From the prior notes I do not see any history of dementia or similar concerns related to mental status.  Differential for the abdominal pain and vomiting includes ileus, SBO, gastroenteritis, colitis, diverticulitis, hepatobiliary etiology.  We will obtain lab work-up, CT abdomen, and reassess.  ----------------------------------------- 1:56 PM on 04/07/2020 -----------------------------------------  Lab work-up reveals mild leukocytosis but no other significant acute findings.  Creatinine is unchanged from baseline and the LFTs are normal.  UA is also negative.  CT abdomen showed stool consistent with possible constipation.  The patient also has a dilated common bile duct, but given no upper abdominal pain or abnormal LFTs or bilirubin, there is no evidence of acute biliary duct  obstruction.  Clinically, the patient has improved.  She has now been in the ED for almost 9 hours.  She has no abdominal pain at this time.  Her vital signs remained stable.  She is no longer vomiting and is tolerating p.o. solids and liquids.  She feels comfortable.  At this time, the patient is stable for discharge back to her facility.  I counseled her on the results of the work-up and provided a printed summary and return precautions.  ____________________________________________   FINAL CLINICAL IMPRESSION(S) / ED DIAGNOSES  Final diagnoses:  Lower abdominal pain  Non-intractable vomiting with nausea, unspecified vomiting type      NEW MEDICATIONS STARTED DURING THIS VISIT:  New Prescriptions   No medications on file     Note:  This document was  prepared using Systems analyst and may include unintentional dictation errors.    Arta Silence, MD 04/07/20 1359

## 2020-04-07 NOTE — ED Notes (Signed)
Purewick placed on patient. Bed alarm placed as well.

## 2020-04-07 NOTE — Discharge Instructions (Signed)
Roberta Ross had a CT abdomen which showed some constipation but no other acute findings.  Her vomiting and abdominal pain resolved and she is able to eat and drink.  She should return to the ER immediately for new, worsening, or persistent severe nausea or vomiting, abdominal pain, fever, weakness, or any other new or worsening symptoms that are concerning.

## 2020-04-07 NOTE — ED Triage Notes (Signed)
Pt brought in by [facility] from [facility] after hip fx 11/28. Staff states tonight she is vomiting greenish sputum, and co abd pain with distention.

## 2020-04-07 NOTE — ED Notes (Signed)
Monitor alarm heard going off in triage 2, RN to room to assess, pt's HR noted to be in the 30's, with stimulation pt's HR noted to increase to 120. Charge RN called and patietn taken back to room 17. Pt placed on monitor and EKG obtained.

## 2020-04-07 NOTE — ED Notes (Signed)
Attempted to contact [facility] for report and patient transport. No one was available to take report. Leda Gauze took my name and number and will have someone call back shortly.

## 2020-04-07 NOTE — ED Notes (Signed)
VS obtained by this RN. Pt resting in bed at this time with eyes closed, remains on 2L via [place] at this time.

## 2020-04-07 NOTE — ED Notes (Signed)
Pt ok to eat and drink per Dr. Cherylann Banas - given water and applesauce

## 2020-04-07 NOTE — ED Notes (Signed)
[facility] called for transport back to [facility] 

## 2020-04-07 NOTE — ED Notes (Signed)
Report given to [place] at [facility]

## 2020-04-08 DIAGNOSIS — R1084 Generalized abdominal pain: Secondary | ICD-10-CM

## 2020-04-08 DIAGNOSIS — R110 Nausea: Secondary | ICD-10-CM | POA: Diagnosis not present

## 2020-04-08 DIAGNOSIS — K5900 Constipation, unspecified: Secondary | ICD-10-CM

## 2020-04-08 DIAGNOSIS — R140 Abdominal distension (gaseous): Secondary | ICD-10-CM

## 2020-05-03 DIAGNOSIS — F39 Unspecified mood [affective] disorder: Secondary | ICD-10-CM

## 2020-05-03 DIAGNOSIS — I10 Essential (primary) hypertension: Secondary | ICD-10-CM

## 2020-05-03 DIAGNOSIS — I4819 Other persistent atrial fibrillation: Secondary | ICD-10-CM | POA: Diagnosis not present

## 2020-05-03 DIAGNOSIS — S7292XA Unspecified fracture of left femur, initial encounter for closed fracture: Secondary | ICD-10-CM

## 2020-05-03 DIAGNOSIS — J449 Chronic obstructive pulmonary disease, unspecified: Secondary | ICD-10-CM | POA: Diagnosis not present

## 2020-05-10 ENCOUNTER — Encounter: Payer: Self-pay | Admitting: Orthopedic Surgery

## 2020-05-18 DIAGNOSIS — S7292XA Unspecified fracture of left femur, initial encounter for closed fracture: Secondary | ICD-10-CM | POA: Diagnosis not present

## 2020-05-18 DIAGNOSIS — R2242 Localized swelling, mass and lump, left lower limb: Secondary | ICD-10-CM | POA: Diagnosis not present

## 2020-09-06 ENCOUNTER — Telehealth: Payer: Self-pay | Admitting: Primary Care

## 2020-09-06 NOTE — Telephone Encounter (Signed)
Spoke with patient and explained to her that I was calling from [place] and that Dr. Ouida Sills had sent [place] a referral and she was aware of this, but she immediately told me that she has a broken femur and did not feel like talking with me right now.  I will f/u with patient in a couple of days.

## 2020-09-07 ENCOUNTER — Telehealth: Payer: Self-pay | Admitting: Primary Care

## 2020-09-07 NOTE — Telephone Encounter (Signed)
Attempted to contact patient's daughter, Butch Penny, to let her know that I was trying to schedule a Palliative visit with patient, no answer - unable to leave message due to mailbox was full.

## 2020-09-08 ENCOUNTER — Telehealth: Payer: Self-pay | Admitting: Primary Care

## 2020-09-08 NOTE — Telephone Encounter (Signed)
Spoke with patient regarding the Palliative referral/services and she was in agreement with scheduling a Telephone Consult, this was scheduled for 09/13/20 @ 11:30 AM.

## 2020-09-13 ENCOUNTER — Other Ambulatory Visit: Payer: Self-pay

## 2020-09-13 ENCOUNTER — Other Ambulatory Visit: Payer: Medicare Other | Admitting: Primary Care

## 2021-09-08 IMAGING — CR DG ABDOMEN ACUTE W/ 1V CHEST
1 series · 4 of 4 positions shown · non-contrast
Comparison: CT scan 11/03/2019

CLINICAL DATA: Vomiting.

EXAM:
DG ABDOMEN ACUTE WITH 1 VIEW CHEST

[Series 1: view not recorded · 0.14mm/px · 4 of 4 slices shown]
[im 1/4]
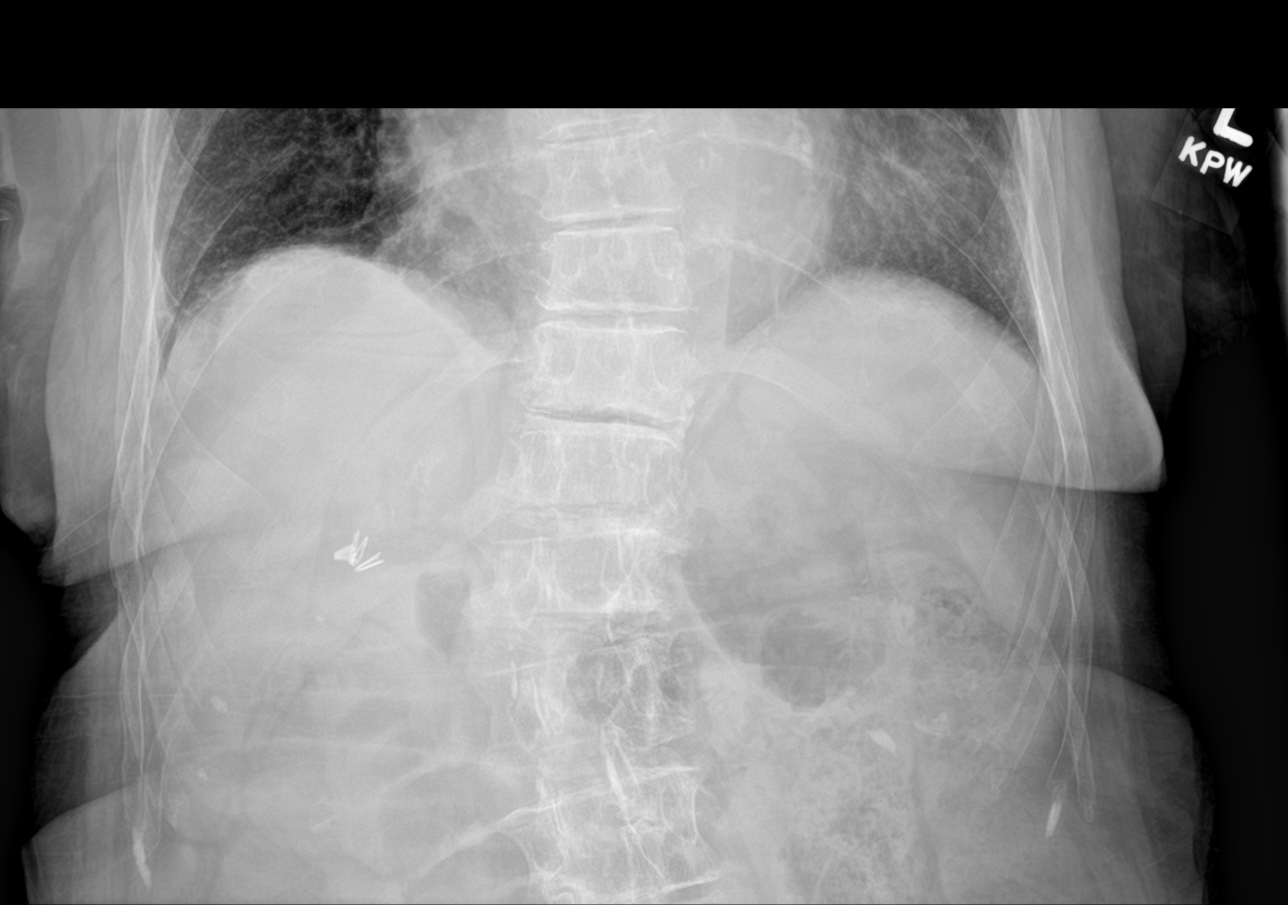
[im 2/4]
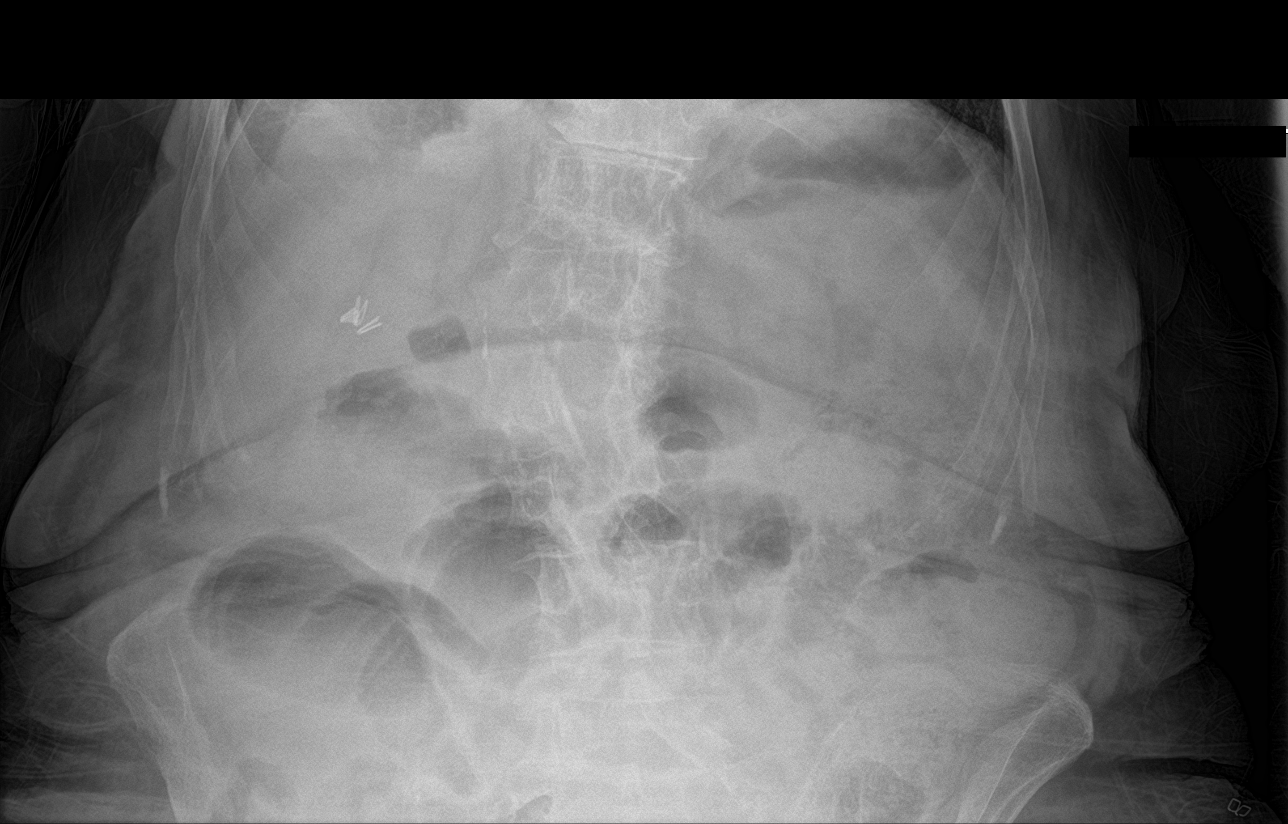
[im 3/4]
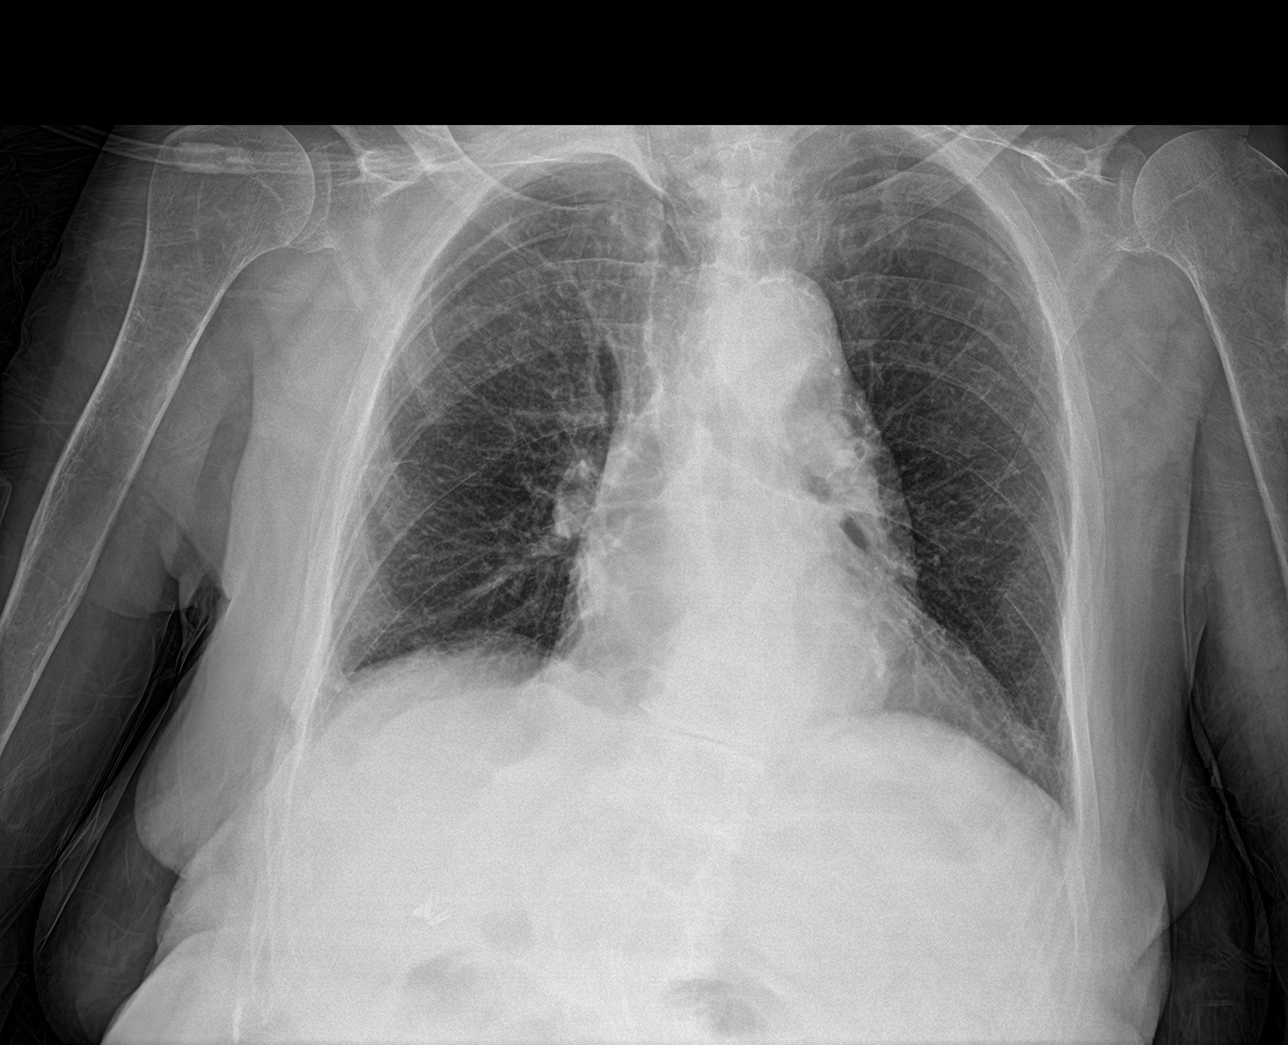
[im 4/4]
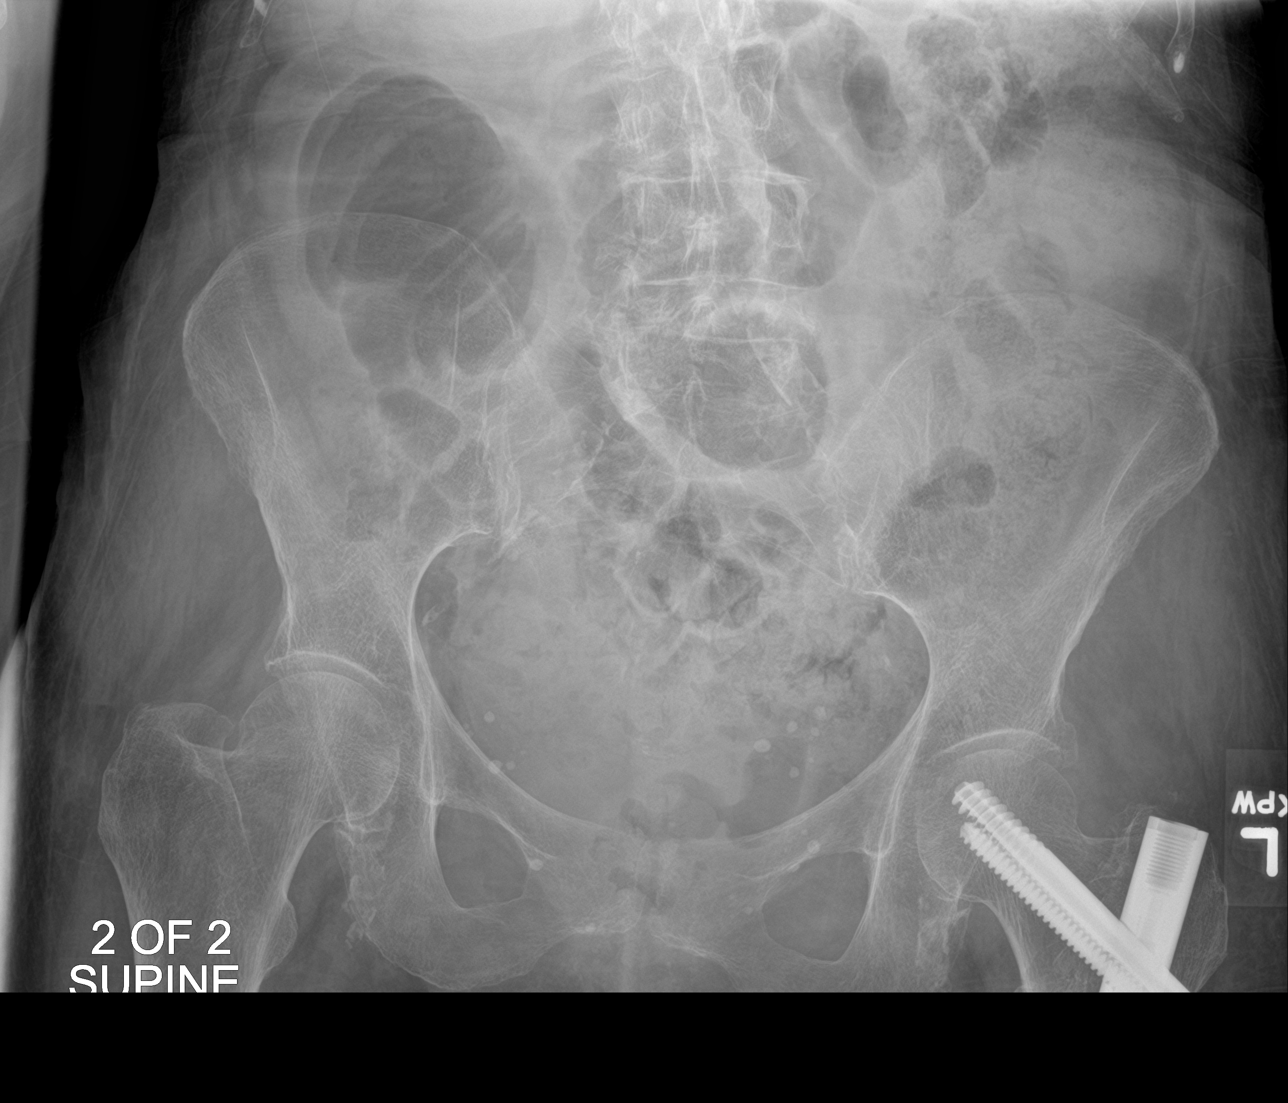

[4 of 4 positions shown; findings below may reference images not displayed]

FINDINGS: The upright chest x-ray is unremarkable. There is tortuosity and
calcification of the thoracic aorta. The heart is normal in size. No
acute pulmonary findings.

Scattered air and stool in the colon and scattered air-filled small
bowel loops but significant distension. No free air.

The bony structures are unremarkable.
IMPRESSION: 1. No acute cardiopulmonary findings.
2. Nonspecific bowel gas pattern. No findings for obstruction or
perforation.

## 2021-11-10 ENCOUNTER — Encounter
Admission: RE | Admit: 2021-11-10 | Discharge: 2021-11-10 | Disposition: A | Discharge status: Home / Self Care | Payer: Medicare Other | Source: Ambulatory Visit | Attending: Otolaryngology | Admitting: Otolaryngology

## 2021-11-10 VITALS — Ht 65.0 in | Wt 140.0 lb

## 2021-11-10 DIAGNOSIS — I4891 Unspecified atrial fibrillation: Secondary | ICD-10-CM

## 2021-11-10 DIAGNOSIS — I10 Essential (primary) hypertension: Secondary | ICD-10-CM

## 2021-11-10 HISTORY — DX: Dyspnea, unspecified: R06.00

## 2021-11-10 HISTORY — DX: Chronic kidney disease, unspecified: N18.9

## 2021-11-10 NOTE — Patient Instructions (Addendum)
Your procedure is scheduled on: Wednesday November 15, 2021. Report to Day Surgery inside [facility] 2nd floor, stop by admissions desk before getting on elevator. To find out your arrival time please call [phone] between 1PM - 3PM on Tuesday November 14, 2021.  Remember: Instructions that are not followed completely may result in serious medical risk,  up to and including death, or upon the discretion of your surgeon and anesthesiologist your  surgery may need to be rescheduled.     _X__ 1. Do not eat food after midnight the night before your procedure.                 No chewing gum or hard candies. You may drink clear liquids up to 2 hours                 before you are scheduled to arrive for your surgery- DO not drink clear                 liquids within 2 hours of the start of your surgery.                 Clear Liquids include:  water, apple juice without pulp, clear Gatorade, G2 or                  Gatorade Zero (avoid Red/Purple/Blue), Black Coffee or Tea (Do not add                 anything to coffee or tea).  __X__2.  On the morning of surgery brush your teeth with toothpaste and water, you                may rinse your mouth with mouthwash if you wish.  Do not swallow any toothpaste or mouthwash.     _X__ 3.  No Alcohol for 24 hours before or after surgery.   _X__ 4.  Do Not Smoke or use e-cigarettes For 24 Hours Prior to Your Surgery.                 Do not use any chewable tobacco products for at least 6 hours prior to                 Surgery.  _X__  5.  Do not use any recreational drugs (marijuana, cocaine, heroin, ecstasy, MDMA or other)                For at least one week prior to your surgery.  Combination of these drugs with anesthesia                May have life threatening results.  ____  6.  Bring all medications with you on the day of surgery if instructed.   __X__  7.  Notify your doctor if there is any change in your medical condition       (cold, fever, infections).     Do not wear jewelry, make-up, hairpins, clips or nail polish. Do not wear lotions, powders, or perfumes. You may wear deodorant. Do not shave 48 hours prior to surgery. Men may shave face and neck. Do not bring valuables to the hospital.    [facility] is not responsible for any belongings or valuables.  Contacts, dentures or bridgework may not be worn into surgery. Leave your suitcase in the car. After surgery it may be brought to your room. For patients admitted to the hospital, discharge time is determined  by your treatment team.   Patients discharged the day of surgery will not be allowed to drive home.   Make arrangements for someone to be with you for the first 24 hours of your Same Day Discharge.   __X__ Take these medicines the morning of surgery with A SIP OF WATER:    1. acetaminophen (TYLENOL) 650 MG   2. diltiazem (CARDIZEM) 120 MG   3.   4.  5.  6.  ____ Fleet Enema (as directed)   ____ Use CHG Soap (or wipes) as directed  ____ Use Benzoyl Peroxide Gel as instructed  ____ Use inhalers on the day of surgery  ____ Stop metformin 2 days prior to surgery    ____ Take 1/2 of usual insulin dose the night before surgery. No insulin the morning          of surgery.   ____ Call your PCP, cardiologist, or Pulmonologist if taking Coumadin/Plavix/aspirin and ask when to stop before your surgery.   __X__ One Week prior to surgery- Stop Anti-inflammatories such as Ibuprofen, Aleve, Advil, Motrin, meloxicam (MOBIC), diclofenac, etodolac, ketorolac, Toradol, Daypro, piroxicam, Goody's or BC powders. OK TO USE TYLENOL IF NEEDED   __X__ Stop supplements until after surgery.    ____ Bring C-Pap to the hospital.    If you have any questions regarding your pre-procedure instructions,  Please call Pre-admit Testing at [phone]

## 2021-11-13 ENCOUNTER — Encounter
Admission: RE | Admit: 2021-11-13 | Discharge: 2021-11-13 | Disposition: A | Discharge status: Home / Self Care | Payer: Medicare Other | Source: Ambulatory Visit | Attending: Otolaryngology | Admitting: Otolaryngology

## 2021-11-13 DIAGNOSIS — I4891 Unspecified atrial fibrillation: Secondary | ICD-10-CM | POA: Insufficient documentation

## 2021-11-13 DIAGNOSIS — Z01818 Encounter for other preprocedural examination: Secondary | ICD-10-CM | POA: Diagnosis present

## 2021-11-13 DIAGNOSIS — I10 Essential (primary) hypertension: Secondary | ICD-10-CM | POA: Insufficient documentation

## 2021-11-13 LAB — CBC
HCT: 42.2 % (ref 36.0–46.0)
Hemoglobin: 13.6 g/dL (ref 12.0–15.0)
MCH: 30.4 pg (ref 26.0–34.0)
MCHC: 32.2 g/dL (ref 30.0–36.0)
MCV: 94.4 fL (ref 80.0–100.0)
Platelets: 189 10*3/uL (ref 150–400)
RBC: 4.47 MIL/uL (ref 3.87–5.11)
RDW: 13.2 % (ref 11.5–15.5)
WBC: 6.8 10*3/uL (ref 4.0–10.5)
nRBC: 0 % (ref 0.0–0.2)

## 2021-11-13 LAB — BASIC METABOLIC PANEL
Anion gap: 10 (ref 5–15)
BUN: 22 mg/dL (ref 8–23)
CO2: 26 mmol/L (ref 22–32)
Calcium: 10.6 mg/dL — ABNORMAL HIGH (ref 8.9–10.3)
Chloride: 106 mmol/L (ref 98–111)
Creatinine, Ser: 1.36 mg/dL — ABNORMAL HIGH (ref 0.44–1.00)
GFR, Estimated: 38 mL/min — ABNORMAL LOW (ref >60)
Glucose, Bld: 100 mg/dL — ABNORMAL HIGH (ref 70–99)
Potassium: 4.6 mmol/L (ref 3.5–5.1)
Sodium: 142 mmol/L (ref 135–145)

## 2021-11-15 ENCOUNTER — Other Ambulatory Visit: Payer: Self-pay

## 2021-11-15 ENCOUNTER — Encounter: Payer: Self-pay | Admitting: Otolaryngology

## 2021-11-15 ENCOUNTER — Ambulatory Visit: Payer: Medicare Other | Admitting: Certified Registered Nurse Anesthetist

## 2021-11-15 ENCOUNTER — Ambulatory Visit
Admission: RE | Admit: 2021-11-15 | Discharge: 2021-11-15 | Disposition: A | Discharge status: Home / Self Care | Payer: Medicare Other | Attending: Otolaryngology | Admitting: Otolaryngology

## 2021-11-15 ENCOUNTER — Encounter
Admission: RE | Disposition: A | Discharge status: Home / Self Care | Payer: Self-pay | Source: Home / Self Care | Attending: Otolaryngology

## 2021-11-15 ENCOUNTER — Ambulatory Visit: Payer: Medicare Other | Admitting: Urgent Care

## 2021-11-15 DIAGNOSIS — I130 Hypertensive heart and chronic kidney disease with heart failure and stage 1 through stage 4 chronic kidney disease, or unspecified chronic kidney disease: Secondary | ICD-10-CM | POA: Diagnosis not present

## 2021-11-15 DIAGNOSIS — I5020 Unspecified systolic (congestive) heart failure: Secondary | ICD-10-CM | POA: Diagnosis not present

## 2021-11-15 DIAGNOSIS — C44219 Basal cell carcinoma of skin of left ear and external auricular canal: Secondary | ICD-10-CM | POA: Insufficient documentation

## 2021-11-15 DIAGNOSIS — D649 Anemia, unspecified: Secondary | ICD-10-CM | POA: Insufficient documentation

## 2021-11-15 DIAGNOSIS — D759 Disease of blood and blood-forming organs, unspecified: Secondary | ICD-10-CM | POA: Insufficient documentation

## 2021-11-15 DIAGNOSIS — N189 Chronic kidney disease, unspecified: Secondary | ICD-10-CM | POA: Insufficient documentation

## 2021-11-15 DIAGNOSIS — Z87891 Personal history of nicotine dependence: Secondary | ICD-10-CM | POA: Diagnosis not present

## 2021-11-15 HISTORY — PX: EXCISION MASS HEAD: SHX6702

## 2021-11-15 HISTORY — PX: COMPLEX WOUND CLOSURE: SHX6446

## 2021-11-15 SURGERY — COMPLEX CLOSURE, WOUND
Anesthesia: General | Laterality: Left

## 2021-11-15 MED ORDER — CHLORHEXIDINE GLUCONATE 0.12 % MT SOLN
OROMUCOSAL | Status: AC
  Administered 2021-11-15: 15 mL via OROMUCOSAL
  Filled 2021-11-15: qty 15

## 2021-11-15 MED ORDER — TRAMADOL HCL 50 MG PO TABS: 50.0000 mg | ORAL_TABLET | Freq: Once | ORAL | Status: DC

## 2021-11-15 MED ORDER — PHENYLEPHRINE 80 MCG/ML (10ML) SYRINGE FOR IV PUSH (FOR BLOOD PRESSURE SUPPORT)
PREFILLED_SYRINGE | INTRAVENOUS | Status: AC
  Filled 2021-11-15: qty 10

## 2021-11-15 MED ORDER — ONDANSETRON HCL 4 MG/2ML IJ SOLN
INTRAMUSCULAR | Status: DC | PRN
  Administered 2021-11-15: 4 mg via INTRAVENOUS

## 2021-11-15 MED ORDER — ORAL CARE MOUTH RINSE: 15.0000 mL | Freq: Once | OROMUCOSAL | Status: AC

## 2021-11-15 MED ORDER — LIDOCAINE-EPINEPHRINE (PF) 1 %-1:200000 IJ SOLN
INTRAMUSCULAR | Status: AC
  Filled 2021-11-15: qty 30

## 2021-11-15 MED ORDER — ONDANSETRON HCL 4 MG/2ML IJ SOLN
INTRAMUSCULAR | Status: AC
  Filled 2021-11-15: qty 2

## 2021-11-15 MED ORDER — PHENYLEPHRINE HCL-NACL 20-0.9 MG/250ML-% IV SOLN
INTRAVENOUS | Status: DC | PRN
  Administered 2021-11-15: 40 ug/min via INTRAVENOUS

## 2021-11-15 MED ORDER — PROPOFOL 10 MG/ML IV BOLUS
INTRAVENOUS | Status: AC
  Filled 2021-11-15: qty 20

## 2021-11-15 MED ORDER — LIDOCAINE HCL (CARDIAC) PF 100 MG/5ML IV SOSY
PREFILLED_SYRINGE | INTRAVENOUS | Status: DC | PRN
  Administered 2021-11-15: 60 mg via INTRAVENOUS

## 2021-11-15 MED ORDER — ACETAMINOPHEN 500 MG PO TABS
1000.0000 mg | ORAL_TABLET | Freq: Once | ORAL | Status: AC
  Administered 2021-11-15: 1000 mg via ORAL

## 2021-11-15 MED ORDER — PHENYLEPHRINE 80 MCG/ML (10ML) SYRINGE FOR IV PUSH (FOR BLOOD PRESSURE SUPPORT)
PREFILLED_SYRINGE | INTRAVENOUS | Status: DC | PRN
  Administered 2021-11-15: 80 ug via INTRAVENOUS

## 2021-11-15 MED ORDER — ACETAMINOPHEN 500 MG PO TABS
ORAL_TABLET | ORAL | Status: AC
  Filled 2021-11-15: qty 2

## 2021-11-15 MED ORDER — LIDOCAINE HCL (PF) 2 % IJ SOLN
INTRAMUSCULAR | Status: AC
  Filled 2021-11-15: qty 5

## 2021-11-15 MED ORDER — OXYCODONE HCL 5 MG PO TABS: 5.0000 mg | ORAL_TABLET | ORAL | 0 refills | Status: AC | PRN

## 2021-11-15 MED ORDER — CHLORHEXIDINE GLUCONATE 0.12 % MT SOLN: 15.0000 mL | Freq: Once | OROMUCOSAL | Status: AC

## 2021-11-15 MED ORDER — TRAMADOL HCL 50 MG PO TABS
ORAL_TABLET | ORAL | Status: AC
  Administered 2021-11-15: 50 mg
  Filled 2021-11-15: qty 1

## 2021-11-15 MED ORDER — FAMOTIDINE 20 MG PO TABS
20.0000 mg | ORAL_TABLET | Freq: Once | ORAL | Status: AC
  Administered 2021-11-15: 20 mg via ORAL

## 2021-11-15 MED ORDER — SODIUM CHLORIDE 0.9 % IV SOLN: INTRAVENOUS | Status: DC

## 2021-11-15 MED ORDER — OXYCODONE HCL 5 MG PO TABS
ORAL_TABLET | ORAL | Status: AC
  Filled 2021-11-15: qty 1

## 2021-11-15 MED ORDER — 0.9 % SODIUM CHLORIDE (POUR BTL) OPTIME
TOPICAL | Status: DC | PRN
  Administered 2021-11-15: 150 mL

## 2021-11-15 MED ORDER — FENTANYL CITRATE (PF) 100 MCG/2ML IJ SOLN
INTRAMUSCULAR | Status: AC
  Filled 2021-11-15: qty 2

## 2021-11-15 MED ORDER — BACITRACIN 500 UNIT/GM EX OINT
TOPICAL_OINTMENT | CUTANEOUS | Status: DC | PRN
  Administered 2021-11-15: 1 via TOPICAL

## 2021-11-15 MED ORDER — DEXAMETHASONE SODIUM PHOSPHATE 10 MG/ML IJ SOLN
INTRAMUSCULAR | Status: DC | PRN
  Administered 2021-11-15: 10 mg via INTRAVENOUS

## 2021-11-15 MED ORDER — LACTATED RINGERS IV SOLN: INTRAVENOUS | Status: DC | PRN

## 2021-11-15 MED ORDER — SUCCINYLCHOLINE CHLORIDE 200 MG/10ML IV SOSY
PREFILLED_SYRINGE | INTRAVENOUS | Status: DC | PRN
  Administered 2021-11-15: 90 mg via INTRAVENOUS

## 2021-11-15 MED ORDER — DEXAMETHASONE SODIUM PHOSPHATE 10 MG/ML IJ SOLN
INTRAMUSCULAR | Status: AC
  Filled 2021-11-15: qty 1

## 2021-11-15 MED ORDER — FAMOTIDINE 20 MG PO TABS
ORAL_TABLET | ORAL | Status: AC
  Filled 2021-11-15: qty 1

## 2021-11-15 MED ORDER — PROPOFOL 10 MG/ML IV BOLUS
INTRAVENOUS | Status: DC | PRN
  Administered 2021-11-15: 30 mg via INTRAVENOUS
  Administered 2021-11-15: 80 mg via INTRAVENOUS

## 2021-11-15 MED ORDER — FENTANYL CITRATE (PF) 100 MCG/2ML IJ SOLN
INTRAMUSCULAR | Status: DC | PRN
  Administered 2021-11-15 (×2): 25 ug via INTRAVENOUS

## 2021-11-15 MED ORDER — LIDOCAINE-EPINEPHRINE (PF) 1 %-1:200000 IJ SOLN
INTRAMUSCULAR | Status: DC | PRN
  Administered 2021-11-15: 7 mL

## 2021-11-15 MED ORDER — OXYCODONE HCL 5 MG PO TABS
2.5000 mg | ORAL_TABLET | Freq: Once | ORAL | Status: AC
  Administered 2021-11-15: 2.5 mg via ORAL

## 2021-11-15 SURGICAL SUPPLY — 30 items
APL SWBSTK 6 STRL LF DISP (MISCELLANEOUS) ×2
APPLICATOR COTTON TIP 6 STRL (MISCELLANEOUS) IMPLANT
APPLICATOR COTTON TIP 6IN STRL (MISCELLANEOUS) ×4 IMPLANT
BLADE SURG 15 STRL LF DISP TIS (BLADE) ×1 IMPLANT
BLADE SURG 15 STRL SS (BLADE) ×2
CORD BIP STRL DISP 12FT (MISCELLANEOUS) ×1 IMPLANT
DRSG GLASSCOCK MASTOID ADT (GAUZE/BANDAGES/DRESSINGS) ×1 IMPLANT
ELECT CAUTERY BLADE TIP 2.5 (TIP) ×2
ELECT CAUTERY NEEDLE TIP 1.0 (MISCELLANEOUS) ×2
ELECT REM PT RETURN 9FT ADLT (ELECTROSURGICAL) ×2
ELECTRODE CAUTERY BLDE TIP 2.5 (TIP) IMPLANT
ELECTRODE CAUTERY NEDL TIP 1.0 (MISCELLANEOUS) ×1 IMPLANT
ELECTRODE REM PT RTRN 9FT ADLT (ELECTROSURGICAL) ×1 IMPLANT
FORCEPS JEWEL BIP 4-3/4 STR (INSTRUMENTS) ×1 IMPLANT
GAUZE 4X4 16PLY ~~LOC~~+RFID DBL (SPONGE) ×2 IMPLANT
GAUZE SPONGE 4X4 12PLY STRL (GAUZE/BANDAGES/DRESSINGS) ×1 IMPLANT
GLOVE PROTEXIS LATEX SZ 7.5 (GLOVE) ×2 IMPLANT
GLOVE SURG LATEX 7.5 PF (GLOVE) ×1 IMPLANT
GOWN STRL REUS W/ TWL LRG LVL3 (GOWN DISPOSABLE) ×2 IMPLANT
GOWN STRL REUS W/TWL LRG LVL3 (GOWN DISPOSABLE) ×4
KIT TURNOVER KIT A (KITS) ×2 IMPLANT
LABEL OR SOLS (LABEL) ×2 IMPLANT
MANIFOLD NEPTUNE II (INSTRUMENTS) ×2 IMPLANT
MARGIN MAP 10MM (MISCELLANEOUS) ×1 IMPLANT
PACK HEAD/NECK (MISCELLANEOUS) ×2 IMPLANT
SUT ETHILON 3-0 FS-10 30 BLK (SUTURE) ×2
SUT PROLENE 6 0 BV (SUTURE) ×4 IMPLANT
SUT VIC AB 4-0 RB1 18 (SUTURE) ×1 IMPLANT
SUTURE EHLN 3-0 FS-10 30 BLK (SUTURE) IMPLANT
WATER STERILE IRR 500ML POUR (IV SOLUTION) ×2 IMPLANT

## 2021-11-15 NOTE — Anesthesia Procedure Notes (Signed)
Procedure Name: Intubation Date/Time: 11/15/2021 10:43 AM  Performed by: Tollie Eth, CRNAPre-anesthesia Checklist: Patient identified, Emergency Drugs available, Suction available and Patient being monitored Patient Re-evaluated:Patient Re-evaluated prior to induction Oxygen Delivery Method: Circle system utilized Preoxygenation: Pre-oxygenation with 100% oxygen Induction Type: IV induction Ventilation: Mask ventilation without difficulty Laryngoscope Size: Miller and 2 Grade View: Grade I Tube type: Oral Tube size: 7.0 mm Number of attempts: 1 Airway Equipment and Method: Stylet and Oral airway Placement Confirmation: ETT inserted through vocal cords under direct vision, positive ETCO2 and breath sounds checked- equal and bilateral Secured at: 19 cm Tube secured with: Tape Dental Injury: Teeth and Oropharynx as per pre-operative assessment  Comments: Placed by Orland Mustard SRNA

## 2021-11-15 NOTE — Anesthesia Preprocedure Evaluation (Addendum)
Anesthesia Evaluation  Patient identified by MRN, date of birth, ID band Patient awake    Reviewed: Allergy & Precautions, NPO status , Patient's Chart, lab work & pertinent test results  Airway Mallampati: III       Dental  (+) Edentulous Upper, Poor Dentition, Missing Lower teeth - poor dentition:   Pulmonary shortness of breath and with exertion, former smoker,    Pulmonary exam normal breath sounds clear to auscultation       Cardiovascular Exercise Tolerance: Poor hypertension, Pt. on medications +CHF and + DOE  + dysrhythmias Atrial Fibrillation + Valvular Problems/Murmurs MR  Rhythm:Irregular Rate:Normal + Diastolic murmurs Echocardiogram 2D complete: (06/2019) INTERPRETATION MODERATE LV SYSTOLIC DYSFUNCTION (See above) WITH MILD LVH NORMAL RIGHT VENTRICULAR SYSTOLIC FUNCTION MODERATE VALVULAR REGURGITATION (See above) NO VALVULAR STENOSIS MODERATE MR MILD TR, PR TRIVIAL AR EF 30%  NM Myocardial Perfusion SPECT multiple (stress and rest): (06/2019) IMPRESSION: Mildly abnormal perfusion scan no significant left ventricular ischemia mild left ventricular enlargement evidence of RV enlargement significant increase in RV uptake ejection fraction of around 40% global hypo-. Intermediate scan recommend conservative medical therapy    Neuro/Psych PSYCHIATRIC DISORDERS Anxiety    GI/Hepatic   Endo/Other  negative endocrine ROS  Renal/GU CRFRenal disease  negative genitourinary   Musculoskeletal  (+) Arthritis , Rheumatoid disorders,    Abdominal Normal abdominal exam  (+)   Peds  Hematology  (+) Blood dyscrasia, anemia ,   Anesthesia Other Findings Past Medical History: No date: Anemia     Comment:  past hx No date: Arthritis     Comment:  hands and feet No date: COPD (chronic obstructive pulmonary disease) ([facility]) No date: Dysrhythmia     Comment:  Pt reports slow HR and papatations No date: Heart murmur      Comment:  past hx No date: Hypercholesteremia No date: Hypertension No date: Rheumatic fever     Comment:  (x2) in past No date: Wears dentures     Comment:  full upper  Reproductive/Obstetrics                           Anesthesia Physical  Anesthesia Plan  ASA: III  Anesthesia Plan: General   Post-op Pain Management: Minimal or no pain anticipated   Induction: Intravenous  PONV Risk Score and Plan: 2 and Ondansetron and Dexamethasone  Airway Management Planned: Oral ETT  Additional Equipment:   Intra-op Plan:   Post-operative Plan: Extubation in OR  Informed Consent: I have reviewed the patients History and Physical, chart, labs and discussed the procedure including the risks, benefits and alternatives for the proposed anesthesia with the patient or authorized representative who has indicated his/her understanding and acceptance.     Dental advisory given  Plan Discussed with: CRNA, Surgeon and Anesthesiologist  Anesthesia Plan Comments:       Anesthesia Quick Evaluation

## 2021-11-15 NOTE — Progress Notes (Signed)
Patient stating pain is moving, right ear, then left eatr , stomach, states she cannot move her arms but is moving all four extremities.

## 2021-11-15 NOTE — Progress Notes (Signed)
Patient complaining of pain in Right ear and stomach, Dr Wynetta Emery at bedside, Tramadol given , ice pack to right ear ,No futher interventions at this time

## 2021-11-15 NOTE — Op Note (Signed)
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  Post-op Dx: Same  Proc: Excision basal cell carcinoma of the left lower ear extending to the lower portion of the ear canal and behind the earlobe into the postauricular crease Complex reconstruction of the left auricle post excision of cancer total suture line was 9 cm.  Total area of reconstruction was 6 cm in diameter.  Surg:  [name] [name]  Anes:  GOT  EBL: 50 mL  Comp: None  Findings: Basal cell carcinoma involving the earlobe and through and through the lobe.  It extended in the preauricular space up posterior some.  It went a couple of millimeters anterior to the anterior earlobe.  It went up to the cartilage just inferior to the exterior ear canal opening.  It extended up onto cartilage laterally approximately 1-1/2 cm. The original margins were clear medially and deep but the more lateral posterior margin had tumor right at the edge.  A second margin was taken and this margin was clear.  Procedure: Patient was brought to the operating room and placed in supine position.  She was given general anesthesia by oral endotracheal intubation.  Her head was extended to the right side.  The left ear and face and neck was prepped and draped in a sterile fashion.  A marking pen was used to mark a 2 to 3 mm margin all the way around any visible or palpable cancer.  This took the lower half of the tragus and then extended in the preauricular Crease anterior to the tumor down beneath the earlobe.  A 1 x 2 cm area of the postauricular skin and postauricular crease was removed.  The incision extended over the lateral edge of the auricle and down into the conchal bowl.  All the cartilage beneath the lower conchal bowl was removed as there was cancer involving some of this.  The incision extended to the edge of the external auditory  canal and then went across the tragus.  This was freed up all the way around and then dissection was carried deep.  The tumor was separated from the periosteum of the mastoid bone as it lay in the postauricular crease.  The deep tissues under the earlobe were freed up to get a margin around the tumor and then the tragus was cut down and approximately one half the depth of the pointer cartilage was removed.  I did not go deeper to stay away from the facial nerve.  This freed up and remove the entire tumor which was marked at its medial lateral and deep margins.  It was sent for frozen section.  The frozen section came back showing the medial and deep margins were clear but the lateral margin at tumor right near the very edge.  A second margin was taken here of approximately 2 to 3 mm.  This was marked at its deep edge and then was sent for further frozen section and this margin was read as negative.  Reconstruction of the wound was done by freeing up the skin anterior and inferior and posterior to the wound.  Some of the cartilage was trimmed at the tragus to allow the skin to roll over and closed the bottom half of the tragus.  The lower end of the lateral border of the auricle had some cartilage trimmed under it so it  could rotate inward and create the bottom of the auricle.  The tragus edge was sutured with 6-0 Prolene.  The lateral auricle edge was sewed up into the bottom using 6-0 Prolene as well.  Inferiorly the posterior inferior skin was pulled forward and held with a 4-0 Vicryl suture.  This was to help cover the undersurface of the new inferior orbital margin.  Skin edges were closed with 6-0 Prolene on the backside of the new lower edge.  The anterior and inferior skin was then pulled up with 4-0 Vicryl's.  It was advanced to close the defect and 4-0 Vicryl's were used to hold that together Burow's triangles were removed at the tragus and then posteriorly along the posterior inferior border of the  auricular cartilage.  The inferior wound was closed with 4-0 Vicryl's and then 6-0 Prolene's were used to close the edge and another Burow's triangle was taken out inferiorly where it had dogeared.  All the wounds were closed with 6-0 Prolene's that were interrupted.  No drain was placed as this was relatively tight.  The ear canal was cleaned out of blood.  The wound was washed and cleaned and the flaps all looked healthy.  The wound edges were then covered with bacitracin.  Telfa was placed over the trimmed edges.  A cottonball was slathered with bacitracin and placed into the conchal bowl.  A Glasscock ear dressing was then placed over the left ear and wound and secured around the head.  The patient tolerated the procedure well.  She was awakened and taken to the recovery room in satisfactory condition.  There were no operative complications.  Dispo:   To PACU to be discharged home  Plan: To follow-up in the office in 1 week for suture removal.  We will plan to keep the Glasscock dressing on for several days and then she can take it off and just use antibiotic ointment to cover the wounds.  She will try not to lay on her left ear for the first week.  She is given some oxycodone 5 mg tablets to use for pain along with the Tylenol that she typically uses for her arthritis  Huey Romans  11/15/2021 1:03 PM

## 2021-11-15 NOTE — Progress Notes (Signed)
Caregiver, Awilda Metro requested MD to assess patient prior to being discharged.  Patient's symptoms progressively improving, however, caregiver did not feel comfortable taking patient home.    Dr. Amie Critchley assessed patient, discussed positioning effects, and reassured caregiver.  Discharge instructions reiterated with caregiver; understanding and agreement verbalized.

## 2021-11-15 NOTE — Transfer of Care (Signed)
Immediate Anesthesia Transfer of Care Note  Patient: Roberta Ross  Procedure(s) Performed: COMPLEX WOUND CLOSURE (Left) EXCISION MALIGNANT MASS EAR (Left)  Patient Location: PACU  Anesthesia Type:General  Level of Consciousness: awake and patient cooperative  Airway & Oxygen Therapy: Patient Spontanous Breathing and Patient connected to face mask  Post-op Assessment: Report given to RN and Post -op Vital signs reviewed and stable  Post vital signs: Reviewed and stable  Last Vitals:  Vitals Value Taken Time  BP 105/72 11/15/21 1305  Temp    Pulse 33 11/15/21 1309  Resp 18 11/15/21 1309  SpO2 100 % 11/15/21 1309  Vitals shown include unvalidated device data.  Last Pain:  Vitals:   11/15/21 0907  TempSrc: Temporal  PainSc: 0-No pain      Patients Stated Pain Goal: 0 (16/10/96 0454)  Complications: No notable events documented.

## 2021-11-15 NOTE — H&P (Signed)
H&P has been reviewed and patient reevaluated, no changes necessary. To be downloaded later.  

## 2021-11-15 NOTE — Discharge Instructions (Signed)

## 2021-11-15 NOTE — Progress Notes (Signed)
Patient complaining of 10/10 right ear pain, radiating towards eye; as if migraine.  Informed Dr. Wynetta Emery of unrelieved pain, order received to administer Oxycodone 2.'5mg'$  prior to discharge.

## 2021-11-16 ENCOUNTER — Encounter: Payer: Self-pay | Admitting: Otolaryngology

## 2021-11-16 LAB — SURGICAL PATHOLOGY

## 2021-11-17 NOTE — Anesthesia Postprocedure Evaluation (Signed)
Anesthesia Post Note  Patient: Roberta Vanzandt  Procedure(s) Performed: COMPLEX WOUND CLOSURE (Left) EXCISION MALIGNANT MASS EAR (Left)  Patient location during evaluation: PACU Anesthesia Type: General Level of consciousness: awake and alert Pain management: pain level controlled Vital Signs Assessment: post-procedure vital signs reviewed and stable Respiratory status: spontaneous breathing, nonlabored ventilation and respiratory function stable Cardiovascular status: blood pressure returned to baseline and stable Postop Assessment: no apparent nausea or vomiting Anesthetic complications: no   No notable events documented.   Last Vitals:  Vitals:   11/15/21 1336 11/15/21 1352  BP:  110/77  Pulse: (!) 109 92  Resp: (!) 22 16  Temp: (!) 36.3 C (!) 36.3 C  SpO2: 93% 96%    Last Pain:  Vitals:   11/15/21 1352  TempSrc: Temporal  PainSc:                  [place] Ouch

## 2022-01-21 ENCOUNTER — Emergency Department: Payer: Medicare Other

## 2022-01-21 ENCOUNTER — Inpatient Hospital Stay
Admission: EM | Admit: 2022-01-21 | Discharge: 2022-01-25 | DRG: 871 | Disposition: A | Discharge status: Skilled Nursing Facility | Payer: Medicare Other | Attending: Internal Medicine | Admitting: Internal Medicine

## 2022-01-21 ENCOUNTER — Encounter: Payer: Self-pay | Admitting: Emergency Medicine

## 2022-01-21 ENCOUNTER — Other Ambulatory Visit: Payer: Self-pay

## 2022-01-21 DIAGNOSIS — Z87891 Personal history of nicotine dependence: Secondary | ICD-10-CM | POA: Diagnosis not present

## 2022-01-21 DIAGNOSIS — M069 Rheumatoid arthritis, unspecified: Secondary | ICD-10-CM | POA: Diagnosis present

## 2022-01-21 DIAGNOSIS — Z8249 Family history of ischemic heart disease and other diseases of the circulatory system: Secondary | ICD-10-CM

## 2022-01-21 DIAGNOSIS — Z66 Do not resuscitate: Secondary | ICD-10-CM | POA: Diagnosis present

## 2022-01-21 DIAGNOSIS — I10 Essential (primary) hypertension: Secondary | ICD-10-CM | POA: Diagnosis not present

## 2022-01-21 DIAGNOSIS — Z825 Family history of asthma and other chronic lower respiratory diseases: Secondary | ICD-10-CM | POA: Diagnosis not present

## 2022-01-21 DIAGNOSIS — N3000 Acute cystitis without hematuria: Secondary | ICD-10-CM

## 2022-01-21 DIAGNOSIS — Z90710 Acquired absence of both cervix and uterus: Secondary | ICD-10-CM

## 2022-01-21 DIAGNOSIS — Z79899 Other long term (current) drug therapy: Secondary | ICD-10-CM | POA: Diagnosis not present

## 2022-01-21 DIAGNOSIS — I5022 Chronic systolic (congestive) heart failure: Secondary | ICD-10-CM | POA: Diagnosis present

## 2022-01-21 DIAGNOSIS — E7800 Pure hypercholesterolemia, unspecified: Secondary | ICD-10-CM | POA: Diagnosis present

## 2022-01-21 DIAGNOSIS — J440 Chronic obstructive pulmonary disease with acute lower respiratory infection: Secondary | ICD-10-CM | POA: Diagnosis present

## 2022-01-21 DIAGNOSIS — R531 Weakness: Secondary | ICD-10-CM | POA: Diagnosis not present

## 2022-01-21 DIAGNOSIS — N1832 Chronic kidney disease, stage 3b: Secondary | ICD-10-CM | POA: Diagnosis present

## 2022-01-21 DIAGNOSIS — I130 Hypertensive heart and chronic kidney disease with heart failure and stage 1 through stage 4 chronic kidney disease, or unspecified chronic kidney disease: Secondary | ICD-10-CM | POA: Diagnosis present

## 2022-01-21 DIAGNOSIS — J449 Chronic obstructive pulmonary disease, unspecified: Secondary | ICD-10-CM | POA: Diagnosis not present

## 2022-01-21 DIAGNOSIS — I5023 Acute on chronic systolic (congestive) heart failure: Secondary | ICD-10-CM | POA: Diagnosis present

## 2022-01-21 DIAGNOSIS — E876 Hypokalemia: Secondary | ICD-10-CM | POA: Diagnosis not present

## 2022-01-21 DIAGNOSIS — J189 Pneumonia, unspecified organism: Secondary | ICD-10-CM | POA: Diagnosis present

## 2022-01-21 DIAGNOSIS — Z7901 Long term (current) use of anticoagulants: Secondary | ICD-10-CM

## 2022-01-21 DIAGNOSIS — F419 Anxiety disorder, unspecified: Secondary | ICD-10-CM | POA: Diagnosis present

## 2022-01-21 DIAGNOSIS — J4489 Other specified chronic obstructive pulmonary disease: Secondary | ICD-10-CM | POA: Diagnosis present

## 2022-01-21 DIAGNOSIS — Z880 Allergy status to penicillin: Secondary | ICD-10-CM

## 2022-01-21 DIAGNOSIS — I482 Chronic atrial fibrillation, unspecified: Secondary | ICD-10-CM | POA: Diagnosis present

## 2022-01-21 DIAGNOSIS — N390 Urinary tract infection, site not specified: Secondary | ICD-10-CM | POA: Diagnosis not present

## 2022-01-21 DIAGNOSIS — Z20822 Contact with and (suspected) exposure to covid-19: Secondary | ICD-10-CM | POA: Diagnosis present

## 2022-01-21 DIAGNOSIS — E785 Hyperlipidemia, unspecified: Secondary | ICD-10-CM | POA: Diagnosis present

## 2022-01-21 DIAGNOSIS — A419 Sepsis, unspecified organism: Secondary | ICD-10-CM | POA: Diagnosis present

## 2022-01-21 LAB — CBC
HCT: 39.1 % (ref 36.0–46.0)
Hemoglobin: 13.1 g/dL (ref 12.0–15.0)
MCH: 31 pg (ref 26.0–34.0)
MCHC: 33.5 g/dL (ref 30.0–36.0)
MCV: 92.4 fL (ref 80.0–100.0)
Platelets: 278 10*3/uL (ref 150–400)
RBC: 4.23 MIL/uL (ref 3.87–5.11)
RDW: 12.5 % (ref 11.5–15.5)
WBC: 11.9 10*3/uL — ABNORMAL HIGH (ref 4.0–10.5)
nRBC: 0 % (ref 0.0–0.2)

## 2022-01-21 LAB — URINALYSIS, COMPLETE (UACMP) WITH MICROSCOPIC
Bilirubin Urine: NEGATIVE
Glucose, UA: NEGATIVE mg/dL
Nitrite: POSITIVE — AB
Protein, ur: 100 mg/dL — AB
Specific Gravity, Urine: 1.015 (ref 1.005–1.030)
pH: 5.5 (ref 5.0–8.0)

## 2022-01-21 LAB — BASIC METABOLIC PANEL
Anion gap: 14 (ref 5–15)
BUN: 26 mg/dL — ABNORMAL HIGH (ref 8–23)
CO2: 23 mmol/L (ref 22–32)
Calcium: 10.3 mg/dL (ref 8.9–10.3)
Chloride: 102 mmol/L (ref 98–111)
Creatinine, Ser: 1.33 mg/dL — ABNORMAL HIGH (ref 0.44–1.00)
GFR, Estimated: 39 mL/min — ABNORMAL LOW (ref >60)
Glucose, Bld: 161 mg/dL — ABNORMAL HIGH (ref 70–99)
Potassium: 3.5 mmol/L (ref 3.5–5.1)
Sodium: 139 mmol/L (ref 135–145)

## 2022-01-21 LAB — HEPATIC FUNCTION PANEL
ALT: 22 U/L (ref 0–44)
AST: 30 U/L (ref 15–41)
Albumin: 3.3 g/dL — ABNORMAL LOW (ref 3.5–5.0)
Alkaline Phosphatase: 111 U/L (ref 38–126)
Bilirubin, Direct: 0.4 mg/dL — ABNORMAL HIGH (ref 0.0–0.2)
Indirect Bilirubin: 0.9 mg/dL (ref 0.3–0.9)
Total Bilirubin: 1.3 mg/dL — ABNORMAL HIGH (ref 0.3–1.2)
Total Protein: 7.9 g/dL (ref 6.5–8.1)

## 2022-01-21 LAB — LACTIC ACID, PLASMA
Lactic Acid, Venous: 1.9 mmol/L (ref 0.5–1.9)
Lactic Acid, Venous: 1.1 mmol/L (ref 0.5–1.9)

## 2022-01-21 LAB — BRAIN NATRIURETIC PEPTIDE: B Natriuretic Peptide: 344.6 pg/mL — ABNORMAL HIGH (ref 0.0–100.0)

## 2022-01-21 LAB — RESP PANEL BY RT-PCR (FLU A&B, COVID) ARPGX2
Influenza A by PCR: NEGATIVE
Influenza B by PCR: NEGATIVE
SARS Coronavirus 2 by RT PCR: NEGATIVE

## 2022-01-21 LAB — PROTIME-INR
INR: 1.2 (ref 0.8–1.2)
Prothrombin Time: 15.4 seconds — ABNORMAL HIGH (ref 11.4–15.2)

## 2022-01-21 LAB — APTT: aPTT: 33 seconds (ref 24–36)

## 2022-01-21 LAB — PROCALCITONIN: Procalcitonin: 0.14 ng/mL

## 2022-01-21 MED ORDER — SODIUM CHLORIDE 0.9 % IV SOLN
2.0000 g | INTRAVENOUS | Status: DC
  Administered 2022-01-22 – 2022-01-25 (×4): 2 g via INTRAVENOUS
  Filled 2022-01-21: qty 20
  Filled 2022-01-21 (×2): qty 2
  Filled 2022-01-21: qty 20

## 2022-01-21 MED ORDER — LACTATED RINGERS IV BOLUS (SEPSIS)
1000.0000 mL | Freq: Once | INTRAVENOUS | Status: AC
  Administered 2022-01-21: 1000 mL via INTRAVENOUS

## 2022-01-21 MED ORDER — VANCOMYCIN HCL IN DEXTROSE 1-5 GM/200ML-% IV SOLN
1000.0000 mg | Freq: Once | INTRAVENOUS | Status: DC
  Filled 2022-01-21: qty 200

## 2022-01-21 MED ORDER — SIMVASTATIN 20 MG PO TABS
20.0000 mg | ORAL_TABLET | Freq: Every day | ORAL | Status: DC
  Administered 2022-01-22 – 2022-01-25 (×4): 20 mg via ORAL
  Filled 2022-01-21 (×4): qty 1

## 2022-01-21 MED ORDER — METRONIDAZOLE 500 MG/100ML IV SOLN
500.0000 mg | Freq: Once | INTRAVENOUS | Status: AC
  Administered 2022-01-21: 500 mg via INTRAVENOUS
  Filled 2022-01-21: qty 100

## 2022-01-21 MED ORDER — ONDANSETRON HCL 4 MG/2ML IJ SOLN
4.0000 mg | Freq: Three times a day (TID) | INTRAMUSCULAR | Status: DC | PRN
  Administered 2022-01-23 – 2022-01-25 (×2): 4 mg via INTRAVENOUS
  Filled 2022-01-21 (×2): qty 2

## 2022-01-21 MED ORDER — SODIUM CHLORIDE 0.9 % IV SOLN
2.0000 g | Freq: Once | INTRAVENOUS | Status: DC
  Filled 2022-01-21: qty 10

## 2022-01-21 MED ORDER — VANCOMYCIN HCL 1250 MG/250ML IV SOLN
1250.0000 mg | Freq: Once | INTRAVENOUS | Status: AC
  Administered 2022-01-21: 1250 mg via INTRAVENOUS
  Filled 2022-01-21: qty 250

## 2022-01-21 MED ORDER — ACETAMINOPHEN 325 MG PO TABS
650.0000 mg | ORAL_TABLET | Freq: Four times a day (QID) | ORAL | Status: DC | PRN
  Administered 2022-01-21 – 2022-01-25 (×7): 650 mg via ORAL
  Filled 2022-01-21 (×8): qty 2

## 2022-01-21 MED ORDER — SODIUM CHLORIDE 0.9 % IV SOLN
2.0000 g | Freq: Once | INTRAVENOUS | Status: AC
  Administered 2022-01-21: 2 g via INTRAVENOUS
  Filled 2022-01-21: qty 12.5

## 2022-01-21 MED ORDER — IOHEXOL 300 MG/ML  SOLN
80.0000 mL | Freq: Once | INTRAMUSCULAR | Status: AC | PRN
  Administered 2022-01-21: 80 mL via INTRAVENOUS

## 2022-01-21 MED ORDER — SODIUM CHLORIDE 0.9 % IV SOLN
500.0000 mg | INTRAVENOUS | Status: DC
  Administered 2022-01-21 – 2022-01-24 (×4): 500 mg via INTRAVENOUS
  Filled 2022-01-21 (×2): qty 5
  Filled 2022-01-21 (×2): qty 500
  Filled 2022-01-21: qty 5

## 2022-01-21 MED ORDER — HYDRALAZINE HCL 20 MG/ML IJ SOLN: 5.0000 mg | INTRAMUSCULAR | Status: DC | PRN

## 2022-01-21 MED ORDER — ALBUTEROL SULFATE (2.5 MG/3ML) 0.083% IN NEBU
2.5000 mg | INHALATION_SOLUTION | RESPIRATORY_TRACT | Status: DC | PRN
  Administered 2022-01-25: 2.5 mg via RESPIRATORY_TRACT
  Filled 2022-01-21: qty 3

## 2022-01-21 MED ORDER — DM-GUAIFENESIN ER 30-600 MG PO TB12: 1.0000 | ORAL_TABLET | Freq: Two times a day (BID) | ORAL | Status: DC | PRN

## 2022-01-21 NOTE — ED Notes (Signed)
Pt back to room 17, room 205 not clean although was marked as ready. Abx restarted.

## 2022-01-21 NOTE — Assessment & Plan Note (Signed)
-  rocephin IV -f/u Urine culture

## 2022-01-21 NOTE — ED Notes (Signed)
Dr Blaine Hamper and pharmacy tech at bedside. Vanc still infusing.

## 2022-01-21 NOTE — Progress Notes (Signed)
CODE SEPSIS - PHARMACY COMMUNICATION  **Broad Spectrum Antibiotics should be administered within 1 hour of Sepsis diagnosis**  Time Code Sepsis Called/Page Received: 1138  Antibiotics Ordered: cefepime, vancomycin, metronidazole  Time of 1st antibiotic administration: 1201   Additional action taken by pharmacy: N/A  If necessary, Name of Provider/Nurse Contacted: N/A   Lorna Dibble ,PharmD Clinical Pharmacist  01/21/2022  12:15 PM

## 2022-01-21 NOTE — ED Notes (Signed)
Provided water and applesauce.

## 2022-01-21 NOTE — ED Notes (Signed)
All IV infusions still running. Pt had come back from CT and fluids were not reconnected.

## 2022-01-21 NOTE — ED Notes (Signed)
Pt to ED for diarrhea, LLQ abdominal pain, weakness, fever (feeling hot) and chills since 1 week. Pt has RA. States does not take immunosuppressive medications.

## 2022-01-21 NOTE — H&P (Signed)
History and Physical    Roberta Ross OYD:[id number] DOB: [birth year] DOA: 01/21/2022  Referring MD/NP/PA:   PCP: Kirk Ruths, MD   Patient coming from:  The patient is coming from home.  At baseline, pt is independent for most of ADL.        Chief Complaint: SOB, left sided chest pain and mild cough  HPI: Roberta Ross is a 88 y.o. adult with medical history significant of sCHF with EF 30%, HTN, anxiety,  HLD, RA with severe finger deformety, CKD stage IIIb, atrial fibrillation not on anticoagulants and history of subarachnoid hemorrhage, who presents with SOB, left sided chest pain and mild cough.  Patient states that she has been sick for more than 3 days.  She has subjective fever and chills, but her body temperature is 97.4 in ED.  She has mild dry cough, mild shortness of breath and left-sided chest pain.  The chest pain is sharp, mild to moderate, nonradiating, pleuritic, aggravated by deep breathing and cough.  Patient also has nausea, no vomiting or abdominal pain.  She had diarrhea initially, which has resolved.  Currently no diarrhea.  Denies dysuria or burning on urination.  Patient states that she urinates little more often at night.  She has poor appetite and decreased oral intake.  Data reviewed independently and ED Course: pt was found to have WBC 11.9, lactic acid 1.9, 1.1, INR 1.2, PTT 33, positive urinalysis (clear appearance, trace amount of leukocyte, positive nitrite, many bacteria, WBC 21-50), negative COVID PCR, stable renal function, temperature 97.4, soft blood pressure 94/77, 103/73, heart rate 109, RR 31, oxygen saturation 96% on room air.  Chest x-ray showed left lower lobe infiltration.  CT scan of abdomen/pelvis also showed left lower lobe infiltration and a stable 3.7 cm of thoracic aortic aneurysm.  Patient is admitted to telemetry bed as inpatient.  CT-abd/pelvis: 1. LEFT LOWER lobe pneumonia with trace LEFT pleural effusion. Imaging follow-up to  resolution recommended. 2. Small LEFT inguinal hernia containing small bowel loop, without bowel obstruction. 3. Unchanged descending thoracic aortic aneurysm measuring 3.7 cm. 4. Decreased CBD dilatation from 2021. 5. Aortic Atherosclerosis (ICD10-I70.0).   EKG: I have personally reviewed.  Atrial fibrillation, QTc 460, early R wave progression   Review of Systems:   General: has subjective fever and chills. No body weight gain, has poor appetite, has fatigue HEENT: no blurry vision, hearing changes or sore throat Respiratory: has dyspnea, coughing, no wheezing CV: has chest pain, no palpitations GI: has nausea, no vomiting, abdominal pain, diarrhea, constipation GU: no dysuria, burning on urination, has increased urinary frequency in night, No hematuria  Ext: no leg edema Neuro: no unilateral weakness, numbness, or tingling, no vision change or hearing loss Skin: no rash, no skin tear. MSK: No muscle spasm, has finger deformity, no limitation of range of movement in spin Heme: No easy bruising.  Travel history: No recent long distant travel.   Allergy:  Allergies  Allergen Reactions   Metoprolol Shortness Of Breath   Penicillins Other (See Comments)    Internal swelling including throat   Methotrexate Derivatives Nausea Only   Doxycycline Hyclate Rash    Past Medical History:  Diagnosis Date   Anemia    past hx   Arthritis    hands and feet   Chronic kidney disease    COPD (chronic obstructive pulmonary disease) (HCC)    Dyspnea    Dysrhythmia    Pt reports slow HR and papatations   Heart murmur  past hx   Hypercholesteremia    Hypertension    Rheumatic fever    (x2) in past   Wears dentures    full upper    Past Surgical History:  Procedure Laterality Date   ABDOMINAL HYSTERECTOMY     APPENDECTOMY     as a child   CATARACT EXTRACTION W/PHACO Left 01/10/2015   Procedure: CATARACT EXTRACTION PHACO AND INTRAOCULAR LENS PLACEMENT ([place]);  Surgeon: Ronnell Freshwater, MD;  Location: [facility];  Service: Ophthalmology;  Laterality: Left;   COMPLEX WOUND CLOSURE Left 11/15/2021   Procedure: COMPLEX WOUND CLOSURE;  Surgeon: Margaretha Sheffield, MD;  Location: [facility];  Service: ENT;  Laterality: Left;   EXCISION MASS HEAD Left 11/15/2021   Procedure: EXCISION MALIGNANT MASS EAR;  Surgeon: Margaretha Sheffield, MD;  Location: [facility];  Service: ENT;  Laterality: Left;   INTRAMEDULLARY (IM) NAIL INTERTROCHANTERIC Left 03/28/2020   Procedure: INTRAMEDULLARY (IM) NAIL INTERTROCHANTRIC-Hip Fracture;  Surgeon: Leim Fabry, MD;  Location: [facility];  Service: Orthopedics;  Laterality: Left;   TONSILLECTOMY      Social History:  reports that he quit smoking about 3 years ago. His smoking use included cigarettes. He has a 15.00 pack-year smoking history. He has never used smokeless tobacco. He reports that he does not currently use drugs. He reports that he does not drink alcohol.  Family History:  Family History  Problem Relation Age of Onset   Cervical cancer Mother    Heart attack Father    Prostate cancer Father    Asthma Brother      Prior to Admission medications   Medication Sig Start Date End Date Taking? Authorizing Provider  acetaminophen (TYLENOL) 650 MG CR tablet Take 650 mg by mouth 2 (two) times daily.    [provider]  apixaban (ELIQUIS) 2.5 MG TABS tablet Take 1 tablet (2.5 mg total) by mouth 2 (two) times daily for 14 days. 03/31/20 04/14/20  Max Sane, MD  diltiazem (CARDIZEM CD) 240 MG 24 hr capsule Take 240 mg by mouth daily. 09/13/21   [provider]  diltiazem (CARDIZEM) 120 MG tablet Take 120 mg by mouth daily. Hold if systolic BP< or = 119    [provider]  furosemide (LASIX) 40 MG tablet Take 40 mg by mouth daily. 09/12/21   [provider]  sennosides-docusate sodium (SENOKOT-S) 8.6-50 MG tablet Take 2 tablets by mouth 2 (two) times daily.    [provider]  simvastatin  (ZOCOR) 20 MG tablet Take 20 mg by mouth daily. 06/29/15   [provider]    Physical Exam: Vitals:   01/21/22 1230 01/21/22 1300 01/21/22 1330 01/21/22 1430  BP: 94/77 107/80 103/73 106/75  Pulse: 89 68 71 77  Resp: (!) 31 (!) 28 (!) 28 (!) 24  Temp:      TempSrc:      SpO2: 97% 96% 96% 96%  Weight:      Height:       General: Not in acute distress HEENT:       Eyes: PERRL, EOMI, no scleral icterus.       ENT: No discharge from the ears and nose, no pharynx injection, no tonsillar enlargement.        Neck: No JVD, no bruit, no mass felt. Heme: No neck lymph node enlargement. Cardiac: S1/S2, RRR, has murmurs, No gallops or rubs. Respiratory: No rales, wheezing, rhonchi or rubs. GI: Soft, nondistended, nontender, no rebound pain, no organomegaly, BS present. GU:  No hematuria Ext: No pitting leg edema bilaterally. 1+DP/PT pulse bilaterally. Musculoskeletal: Has severe finger deformities in both hands Skin: No rashes.  Neuro: Alert, oriented X3, cranial nerves II-XII grossly intact, moves all extremities normally. Psych: Patient is not psychotic, no suicidal or hemocidal ideation.  Labs on Admission: I have personally reviewed following labs and imaging studies  CBC: Recent Labs  Lab 01/21/22 1113  WBC 11.9*  HGB 13.1  HCT 39.1  MCV 92.4  PLT 409   Basic Metabolic Panel: Recent Labs  Lab 01/21/22 1113  NA 139  K 3.5  CL 102  CO2 23  GLUCOSE 161*  BUN 26*  CREATININE 1.33*  CALCIUM 10.3   GFR: Estimated Creatinine Clearance (by C-G formula based on SCr of 1.33 mg/dL (H)) Female: 27.8 mL/min (A) Female: 35.3 mL/min (A) Liver Function Tests: Recent Labs  Lab 01/21/22 1135  AST 30  ALT 22  ALKPHOS 111  BILITOT 1.3*  PROT 7.9  ALBUMIN 3.3*   No results for input(s): "LIPASE", "AMYLASE" in the last 168 hours. No results for input(s): "AMMONIA" in the last 168 hours. Coagulation Profile: Recent Labs  Lab 01/21/22 1154  INR 1.2   Cardiac  Enzymes: No results for input(s): "CKTOTAL", "CKMB", "CKMBINDEX", "TROPONINI" in the last 168 hours. BNP (last 3 results) No results for input(s): "PROBNP" in the last 8760 hours. HbA1C: No results for input(s): "HGBA1C" in the last 72 hours. CBG: No results for input(s): "GLUCAP" in the last 168 hours. Lipid Profile: No results for input(s): "CHOL", "HDL", "LDLCALC", "TRIG", "CHOLHDL", "LDLDIRECT" in the last 72 hours. Thyroid Function Tests: No results for input(s): "TSH", "T4TOTAL", "FREET4", "T3FREE", "THYROIDAB" in the last 72 hours. Anemia Panel: No results for input(s): "VITAMINB12", "FOLATE", "FERRITIN", "TIBC", "IRON", "RETICCTPCT" in the last 72 hours. Urine analysis:    Component Value Date/Time   COLORURINE YELLOW 01/21/2022 1250   APPEARANCEUR CLEAR 01/21/2022 1250   LABSPEC 1.015 01/21/2022 1250   PHURINE 5.5 01/21/2022 1250   GLUCOSEU NEGATIVE 01/21/2022 1250   HGBUR SMALL (A) 01/21/2022 1250   BILIRUBINUR NEGATIVE 01/21/2022 1250   KETONESUR TRACE (A) 01/21/2022 1250   PROTEINUR 100 (A) 01/21/2022 1250   NITRITE POSITIVE (A) 01/21/2022 1250   LEUKOCYTESUR TRACE (A) 01/21/2022 1250   Sepsis Labs: '@LABRCNTIP'$ (procalcitonin:4,lacticidven:4) ) Recent Results (from the past 240 hour(s))  Resp Panel by RT-PCR (Flu A&B, Covid) Anterior Nasal Swab     Status: None   Collection Time: 01/21/22 11:51 AM   Specimen: Anterior Nasal Swab  Result Value Ref Range Status   SARS Coronavirus 2 by RT PCR NEGATIVE NEGATIVE Final    Comment: (NOTE) SARS-CoV-2 target nucleic acids are NOT DETECTED.  The SARS-CoV-2 RNA is generally detectable in upper respiratory specimens during the acute phase of infection. The lowest concentration of SARS-CoV-2 viral copies this assay can detect is 138 copies/mL. A negative result does not preclude SARS-Cov-2 infection and should not be used as the sole basis for treatment or other patient management decisions. A negative result may occur with   improper specimen collection/handling, submission of specimen other than nasopharyngeal swab, presence of viral mutation(s) within the areas targeted by this assay, and inadequate number of viral copies(<138 copies/mL). A negative result must be combined with clinical observations, patient history, and epidemiological information. The expected result is Negative.  Fact Sheet for Patients:  EntrepreneurPulse.com.au  Fact Sheet for Healthcare Providers:  IncredibleEmployment.be  This test is no t yet approved or cleared by the [place] and  has been authorized for detection and/or diagnosis of SARS-CoV-2 by FDA under an Emergency Use Authorization (EUA). This EUA will remain  in effect (meaning this test can be used) for the duration of the COVID-19 declaration under Section 564(b)(1) of the Act, 21 U.S.C.section 360bbb-3(b)(1), unless the authorization is terminated  or revoked sooner.       Influenza A by PCR NEGATIVE NEGATIVE Final   Influenza B by PCR NEGATIVE NEGATIVE Final    Comment: (NOTE) The Xpert Xpress SARS-CoV-2/FLU/RSV plus assay is intended as an aid in the diagnosis of influenza from Nasopharyngeal swab specimens and should not be used as a sole basis for treatment. Nasal washings and aspirates are unacceptable for Xpert Xpress SARS-CoV-2/FLU/RSV testing.  Fact Sheet for Patients: EntrepreneurPulse.com.au  Fact Sheet for Healthcare Providers: IncredibleEmployment.be  This test is not yet approved or cleared by the [place] FDA and has been authorized for detection and/or diagnosis of SARS-CoV-2 by FDA under an Emergency Use Authorization (EUA). This EUA will remain in effect (meaning this test can be used) for the duration of the COVID-19 declaration under Section 564(b)(1) of the Act, 21 U.S.C. section 360bbb-3(b)(1), unless the authorization is terminated  or revoked.  Performed at [facility], [address] [place], [place] [address]      Radiological Exams on Admission: CT ABDOMEN PELVIS W CONTRAST  Result Date: 01/21/2022 CLINICAL DATA:  88 year old female with LEFT LOWER abdominal and pelvic pain with fever. EXAM: CT ABDOMEN AND PELVIS WITH CONTRAST TECHNIQUE: Multidetector CT imaging of the abdomen and pelvis was performed using the standard protocol following bolus administration of intravenous contrast. RADIATION DOSE REDUCTION: This exam was performed according to the departmental dose-optimization program which includes automated exposure control, adjustment of the mA and/or kV according to patient size and/or use of iterative reconstruction technique. CONTRAST:  9m OMNIPAQUE IOHEXOL 300 MG/ML  SOLN COMPARISON:  04/07/2020 CT FINDINGS: Lower chest: Airspace opacities and focal consolidation within the LEFT LOWER lobe likely represents pneumonia. A trace LEFT pleural effusion is noted. Hepatobiliary: The liver is unremarkable. The patient is status post cholecystectomy. CBD is slightly decreased in caliber now measuring 12 mm, previously 15 mm. Pancreas: Unremarkable Spleen: Unremarkable Adrenals/Urinary Tract: Mild bilateral renal atrophy, cortical thinning and bilateral renal cysts are again identified and no follow-up imaging recommended. There is no evidence of hydronephrosis. The adrenal glands and bladder are unremarkable. Stomach/Bowel: Stomach is within normal limits. Appendix not identified. No evidence of bowel wall thickening, distention, or inflammatory changes. Colonic diverticulosis identified without evidence of acute diverticulitis. Vascular/Lymphatic: Irregular calcified and noncalcified atherosclerotic plaque along the LEFT descending thoracic aorta and RIGHT abdominal aorta again noted, slightly increased 2021. The descending thoracic aorta is unchanged in caliber measuring 3.7 cm. There is no evidence of abdominal  aortic aneurysm. No abnormal lymph nodes are identified. Reproductive: Status post hysterectomy. No adnexal masses. Other: No ascites, focal collection or pneumoperitoneum. A small LEFT inguinal hernia containing small bowel loops noted, without bowel obstruction. Musculoskeletal: Lumbar spine scoliosis and degenerative changes again identified. Fixation hardware within the proximal LEFT femur again noted. Chronic 30% compression fracture of L5 again identified. IMPRESSION: 1. LEFT LOWER lobe pneumonia with trace LEFT pleural effusion. Imaging follow-up to resolution recommended. 2. Small LEFT inguinal hernia containing small bowel loop, without bowel obstruction. 3. Unchanged descending thoracic aortic aneurysm measuring 3.7 cm. 4. Decreased CBD dilatation from 2021. 5. Aortic Atherosclerosis (ICD10-I70.0). Electronically Signed   By: JMargarette CanadaM.D.   On: 01/21/2022 14:22  DG Chest Port 1 View  Result Date: 01/21/2022 CLINICAL DATA:  Fever and weakness. EXAM: PORTABLE CHEST 1 VIEW COMPARISON:  04/07/2020 prior studies FINDINGS: LEFT LOWER lung opacities noted suspicious for pneumonia. The cardiomediastinal silhouette is unchanged. Mild interstitial prominence is again noted. There is no evidence of pneumothorax or large pleural effusion. No acute bony abnormalities are identified. IMPRESSION: LEFT LOWER lung opacities suspicious for pneumonia. Radiographic follow-up to resolution is recommended. Electronically Signed   By: Margarette Canada M.D.   On: 01/21/2022 12:35      Assessment/Plan Principal Problem:   CAP (community acquired pneumonia) Active Problems:   UTI (urinary tract infection)   Sepsis ([place])   Atrial fibrillation, chronic (HCC)   Chronic systolic CHF (congestive heart failure) ([place])   Essential hypertension   COPD with chronic bronchitis (HCC)   HLD (hyperlipidemia)   Chronic kidney disease, stage 3b (HCC)   Assessment and Plan: * CAP (community acquired pneumonia)  Patient does  not have oxygen desaturation. - Admit to tele bed as inpt - IV Rocephin and azithromycin (patient received 1 dose of vancomycin, cefepime and Flagyl in ED) - Mucinex for cough   -Bronchodilators - Urine legionella and S. pneumococcal antigen - Follow up blood culture x2, sputum culture   UTI (urinary tract infection) -rocephin IV -f/u Urine culture  Sepsis ([place]) Sepsis due to CAP and UTI: Pt meets criteria for sepsis with HR 109 and RR 31.  Blood pressure is soft at 94/77, which improved to 103/73 after giving 2 L LR bolus in ED.  If blood pressure drops, patient agrees to use vasopressor temporarily. -on IV abx as sbove - will get Procalcitonin and trend lactic acid level per sepsis protocol - IVF: 2L of LR bolus in ED (patient has congestive heart failure, limiting aggressive IV fluids treatment)  Atrial fibrillation, chronic (HCC) HR 70-80s.  Patient is not taking anticoagulants, Pt has history of subarachnoid hemorrhage. -hold Cardizem due to soft blood pressure  Chronic systolic CHF (congestive heart failure) ([place]) 2D echo on 08/03/2019 showed EF of 30%.  Patient does not have leg edema or JVD.  CHF seem to be compensated. -Check BNP -Hold Lasix due to soft blood pressure and and sepsis.  Essential hypertension -Hold blood pressure medications: Cardizem, Lasix due to soft blood pressure and sepsis -IV hydralazine as needed  COPD with chronic bronchitis (HCC) - Bronchodilators  HLD (hyperlipidemia) - Zocor  Chronic kidney disease, stage 3b (HCC) Renal function stable. -Follow-up with BMP          DVT ppx: SCD  Code Status: DNR (I discussed with patient in the presence of her daughter, and explained the meaning of CODE STATUS. Patient wants to be DNR. Pt also states that if her Bp drops in this admission, she will agree to use vasopressor temporarily)  Family Communication:   Yes, patient's  daughter  at bed side.    Disposition Plan:  Anticipate discharge  back to previous environment  Consults called:  none  Admission status and Level of care: Telemetry Medical:    Med-surg bed for obs as inpt    progressive unit for obs   as inpt      SDU/inpation          Dispo: The patient is from: Home              Anticipated d/c is to: Home              Anticipated d/c date is: 2  days              Patient currently is not medically stable to d/c.    Severity of Illness:  The appropriate patient status for this patient is INPATIENT. Inpatient status is judged to be reasonable and necessary in order to provide the required intensity of service to ensure the patient's safety. The patient's presenting symptoms, physical exam findings, and initial radiographic and laboratory data in the context of their chronic comorbidities is felt to place them at high risk for further clinical deterioration. Furthermore, it is not anticipated that the patient will be medically stable for discharge from the hospital within 2 midnights of admission.   * I certify that at the point of admission it is my clinical judgment that the patient will require inpatient hospital care spanning beyond 2 midnights from the point of admission due to high intensity of service, high risk for further deterioration and high frequency of surveillance required.*       Date of Service 01/21/2022    Ivor Costa [facility]   If 7PM-7AM, please contact night-coverage [email or web address] 01/21/2022, 3:37 PM

## 2022-01-21 NOTE — Assessment & Plan Note (Signed)
Sepsis due to CAP and UTI: Pt meets criteria for sepsis with HR 109 and RR 31.  Blood pressure is soft at 94/77, which improved to 103/73 after giving 2 L LR bolus in ED.  If blood pressure drops, patient agrees to use vasopressor temporarily. -on IV abx as sbove - will get Procalcitonin and trend lactic acid level per sepsis protocol - IVF: 2L of LR bolus in ED (patient has congestive heart failure, limiting aggressive IV fluids treatment)

## 2022-01-21 NOTE — Assessment & Plan Note (Addendum)
-   Zocor

## 2022-01-21 NOTE — Progress Notes (Signed)
PHARMACY -  BRIEF ANTIBIOTIC NOTE   Pharmacy has received consult(s) for vancomycin and aztreonam from an ED provider.  The patient's profile has been reviewed for ht/wt/allergies/indication/available labs.    One time order(s) placed for   1) vancomycin 1250 mg IV x 1  2) aztreonam: --Patient tolerated ceftriaxone on admission of 06/24/17 [facility] --per protocol: If history of intolerance, mild allergy, or documented history of use of cephalosporins, pharmacy can adjust aztreonam to cefepime --> cefepime 2 grams IV x 1  Further antibiotics/pharmacy consults should be ordered by admitting physician if indicated.                       Thank you, Dallie Piles 01/21/2022  11:42 AM

## 2022-01-21 NOTE — Assessment & Plan Note (Addendum)
Initially Cardizem held due to soft blood pressure.  Slightly tachycardic which also may be making her feel anxious.  Restarted Cardizem.  Patient not on any anticoagulation.

## 2022-01-21 NOTE — ED Provider Notes (Signed)
[facility] Provider Note    Event Date/Time   First MD Initiated Contact with Patient 01/21/22 1134     (approximate)   History   Nausea, Emesis, Fever, Chills, and Generalized Body Aches   HPI  Roberta Ross is a 88 y.o. adult here with feeling "very sick."  She states that over the last several days, she has had chills, fevers, profuse diarrhea, and generalized body aches.  She has noticed some lower abdominal and left lower quadrant abdominal pain.  She has noticed some mucus like substance in her urine as well.  Denies any significant pain with urination.  No flank pain.  No known sick contacts.  No recent antibiotic use.  No cough or shortness of breath.  No chest pain.     Physical Exam   Triage Vital Signs: ED Triage Vitals  Enc Vitals Group     BP 01/21/22 1118 103/76     Pulse Rate 01/21/22 1118 (!) 109     Resp 01/21/22 1118 16     Temp 01/21/22 1118 (!) 97.4 F (36.3 C)     Temp Source 01/21/22 1118 Oral     SpO2 01/21/22 1118 96 %     Weight 01/21/22 1110 141 lb 1.5 oz (64 kg)     Height 01/21/22 1110 '5\' 5"'$  (1.651 m)     Head Circumference --      Peak Flow --      Pain Score 01/21/22 1110 10     Pain Loc --      Pain Edu? --      Excl. in [place]? --     Most recent vital signs: Vitals:   01/21/22 1330 01/21/22 1430  BP: 103/73 106/75  Pulse: 71 77  Resp: (!) 28 (!) 24  Temp:    SpO2: 96% 96%     General: Awake, no distress.  CV:  Good peripheral perfusion.  Mild tachycardia noted, irregular. Resp:  Normal effort.  Slight tachypnea noted but lungs clear. Abd:  No distention.  Moderate suprapubic and left lower quadrant tenderness.  No rebound or guarding.  No peritonitis. Other:  Mildly dry mucous membranes   ED Results / Procedures / Treatments   Labs (all labs ordered are listed, but only abnormal results are displayed) Labs Reviewed  BASIC METABOLIC PANEL - Abnormal; Notable for the following components:       Result Value   Glucose, Bld 161 (*)    BUN 26 (*)    Creatinine, Ser 1.33 (*)    GFR, Estimated 39 (*)    All other components within normal limits  CBC - Abnormal; Notable for the following components:   WBC 11.9 (*)    All other components within normal limits  URINALYSIS, COMPLETE (UACMP) WITH MICROSCOPIC - Abnormal; Notable for the following components:   Hgb urine dipstick SMALL (*)    Ketones, ur TRACE (*)    Protein, ur 100 (*)    Nitrite POSITIVE (*)    Leukocytes,Ua TRACE (*)    Bacteria, UA MANY (*)    All other components within normal limits  HEPATIC FUNCTION PANEL - Abnormal; Notable for the following components:   Albumin 3.3 (*)    Total Bilirubin 1.3 (*)    Bilirubin, Direct 0.4 (*)    All other components within normal limits  PROTIME-INR - Abnormal; Notable for the following components:   Prothrombin Time 15.4 (*)    All other components within normal limits  BRAIN NATRIURETIC PEPTIDE - Abnormal; Notable for the following components:   B Natriuretic Peptide 344.6 (*)    All other components within normal limits  RESP PANEL BY RT-PCR (FLU A&B, COVID) ARPGX2  CULTURE, BLOOD (ROUTINE X 2)  CULTURE, BLOOD (ROUTINE X 2)  URINE CULTURE  EXPECTORATED SPUTUM ASSESSMENT W GRAM STAIN, RFLX TO RESP C  LACTIC ACID, PLASMA  LACTIC ACID, PLASMA  APTT  PROCALCITONIN  STREP PNEUMONIAE URINARY ANTIGEN  LEGIONELLA PNEUMOPHILA SEROGP 1 UR AG     EKG Atrial fibrillation with rapid ventricular response, ventricular rate 110.  QRS 82, QTc 460.  No acute ST elevations or depressions.   RADIOLOGY Chest x-ray:LLL PNA CT abdomen/pelvis:LLL PNA, small Left inguinal hernia, unchanged aneurysm   I also independently reviewed and agree with radiologist interpretations.   PROCEDURES:  Critical Care performed: Yes, see critical care procedure note(s)  .Critical Care  Performed by: Duffy Bruce, MD Authorized by: Duffy Bruce, MD   Critical care provider  statement:    Critical care time (minutes):  30   Critical care time was exclusive of:  Separately billable procedures and treating other patients   Critical care was necessary to treat or prevent imminent or life-threatening deterioration of the following conditions:  Cardiac failure, circulatory failure and respiratory failure   Critical care was time spent personally by me on the following activities:  Development of treatment plan with patient or surrogate, discussions with consultants, evaluation of patient's response to treatment, examination of patient, ordering and review of laboratory studies, ordering and review of radiographic studies, ordering and performing treatments and interventions, pulse oximetry, re-evaluation of patient's condition and review of old [place] ED: Medications  cefTRIAXone (ROCEPHIN) 2 g in sodium chloride 0.9 % 100 mL IVPB (has no administration in time range)  azithromycin (ZITHROMAX) 500 mg in sodium chloride 0.9 % 250 mL IVPB (500 mg Intravenous New Bag/Given 01/21/22 1513)  albuterol (PROVENTIL) (2.5 MG/3ML) 0.083% nebulizer solution 2.5 mg (has no administration in time range)  dextromethorphan-guaiFENesin (MUCINEX DM) 30-600 MG per 12 hr tablet 1 tablet (has no administration in time range)  ondansetron (ZOFRAN) injection 4 mg (has no administration in time range)  acetaminophen (TYLENOL) tablet 650 mg (has no administration in time range)  hydrALAZINE (APRESOLINE) injection 5 mg (has no administration in time range)  lactated ringers bolus 1,000 mL (0 mLs Intravenous Stopped 01/21/22 1453)    And  lactated ringers bolus 1,000 mL (0 mLs Intravenous Stopped 01/21/22 1453)  metroNIDAZOLE (FLAGYL) IVPB 500 mg (0 mg Intravenous Stopped 01/21/22 1453)  vancomycin (VANCOREADY) IVPB 1250 mg/250 mL (0 mg Intravenous Stopped 01/21/22 1513)  ceFEPIme (MAXIPIME) 2 g in sodium chloride 0.9 % 100 mL IVPB (0 g Intravenous Stopped 01/21/22 1245)   iohexol (OMNIPAQUE) 300 MG/ML solution 80 mL (80 mLs Intravenous Contrast Given 01/21/22 1346)     IMPRESSION / MDM / [place] / ED COURSE  I reviewed the triage vital signs and the nursing notes.                               The patient is on the cardiac monitor to evaluate for evidence of arrhythmia and/or significant heart rate changes.   Ddx:  Differential includes the following, with pertinent life- or limb-threatening emergencies considered:  Sepsis secondary to diverticulitis, colitis, UTI, other intra-abdominal infection, volvulus, obstruction, left basilar pneumonia, infected hip, anemia, A-fib RVR  Patient's presentation is most consistent with acute presentation with potential threat to life or bodily function.  MDM:  88 yo F with PMHx CHF, HTN, anxiety, RA, here with fever, cough, nausea, chills. Pt hypothermic, tachycardic on arrival with tachypnea. Activated as code sepsis. Broad-spectrum ABX started with IVF (penicillin allergy noted). Initial DDX broad, imaging is most c/w LLL PNA though which does fit with her L flank/abdominal discomfort. No known covid exposures. CBC with mild leukocytosis. BMP near baseline, possible slight dehydration noted. CXR shows LLL PNA as mentioned and CT confirms this. UA also with possible UTI.   Pt feels improved w/ broad spectrum abx, fluids. Will admit to medicine.   MEDICATIONS GIVEN IN ED: Medications  cefTRIAXone (ROCEPHIN) 2 g in sodium chloride 0.9 % 100 mL IVPB (has no administration in time range)  azithromycin (ZITHROMAX) 500 mg in sodium chloride 0.9 % 250 mL IVPB (500 mg Intravenous New Bag/Given 01/21/22 1513)  albuterol (PROVENTIL) (2.5 MG/3ML) 0.083% nebulizer solution 2.5 mg (has no administration in time range)  dextromethorphan-guaiFENesin (MUCINEX DM) 30-600 MG per 12 hr tablet 1 tablet (has no administration in time range)  ondansetron (ZOFRAN) injection 4 mg (has no administration in time range)   acetaminophen (TYLENOL) tablet 650 mg (has no administration in time range)  hydrALAZINE (APRESOLINE) injection 5 mg (has no administration in time range)  lactated ringers bolus 1,000 mL (0 mLs Intravenous Stopped 01/21/22 1453)    And  lactated ringers bolus 1,000 mL (0 mLs Intravenous Stopped 01/21/22 1453)  metroNIDAZOLE (FLAGYL) IVPB 500 mg (0 mg Intravenous Stopped 01/21/22 1453)  vancomycin (VANCOREADY) IVPB 1250 mg/250 mL (0 mg Intravenous Stopped 01/21/22 1513)  ceFEPIme (MAXIPIME) 2 g in sodium chloride 0.9 % 100 mL IVPB (0 g Intravenous Stopped 01/21/22 1245)  iohexol (OMNIPAQUE) 300 MG/ML solution 80 mL (80 mLs Intravenous Contrast Given 01/21/22 1346)     Consults:  Hospitalist   EMR reviewed       FINAL CLINICAL IMPRESSION(S) / ED DIAGNOSES   Final diagnoses:  Sepsis due to pneumonia [facility])  Lower urinary tract infectious disease     Rx / DC Orders   ED Discharge Orders     None        Note:  This document was prepared using Dragon voice recognition software and may include unintentional dictation errors.   Duffy Bruce, MD 01/21/22 [phone]

## 2022-01-21 NOTE — Progress Notes (Signed)
CODE SEPSIS - PHARMACY COMMUNICATION  **Broad Spectrum Antibiotics should be administered within 1 hour of Sepsis diagnosis**  Time Code Sepsis Called/Page Received: 1138  Antibiotics Ordered: cefepime, vancomycin, metronidazole  Time of 1st antibiotic administration: 1201  Additional action taken by pharmacy: none required  If necessary, Name of Provider/Nurse Contacted: N/A    Dallie Piles ,PharmD Clinical Pharmacist  01/21/2022  11:49 AM

## 2022-01-21 NOTE — ED Notes (Signed)
[facility] did not print labels. Labs drawn by triage nurse and sent with chart labels.

## 2022-01-21 NOTE — Assessment & Plan Note (Signed)
-   Bronchodilators 

## 2022-01-21 NOTE — Assessment & Plan Note (Signed)
  Patient does not have oxygen desaturation. - Admit to tele bed as inpt - IV Rocephin and azithromycin (patient received 1 dose of vancomycin, cefepime and Flagyl in ED) - Mucinex for cough   -Bronchodilators - Urine legionella and S. pneumococcal antigen - Follow up blood culture x2, sputum culture

## 2022-01-21 NOTE — Assessment & Plan Note (Addendum)
Blood pressure initially soft.  Now stabilized and resuming home medications.

## 2022-01-21 NOTE — ED Triage Notes (Signed)
Pt reports has had NVD, fever, chills, general bodyaches and weakness for the last week. Reports took a covid test but it was negative.

## 2022-01-21 NOTE — ED Notes (Signed)
Pt has dinner tray.

## 2022-01-21 NOTE — ED Notes (Signed)
Pt in CT.

## 2022-01-21 NOTE — Assessment & Plan Note (Signed)
Renal function stable. -Follow-up with BMP

## 2022-01-21 NOTE — Sepsis Progress Note (Signed)
Sepsis protocol monitored by eLink 

## 2022-01-21 NOTE — Assessment & Plan Note (Signed)
2D echo on 08/03/2019 showed EF of 30%.  Patient does not have leg edema or JVD.  CHF seem to be compensated. -Check BNP -Hold Lasix due to soft blood pressure and and sepsis.

## 2022-01-22 ENCOUNTER — Encounter: Payer: Self-pay | Admitting: Internal Medicine

## 2022-01-22 DIAGNOSIS — N390 Urinary tract infection, site not specified: Secondary | ICD-10-CM | POA: Diagnosis not present

## 2022-01-22 DIAGNOSIS — J189 Pneumonia, unspecified organism: Secondary | ICD-10-CM | POA: Diagnosis not present

## 2022-01-22 DIAGNOSIS — N1832 Chronic kidney disease, stage 3b: Secondary | ICD-10-CM | POA: Diagnosis not present

## 2022-01-22 LAB — CBC
HCT: 31.8 % — ABNORMAL LOW (ref 36.0–46.0)
Hemoglobin: 10.7 g/dL — ABNORMAL LOW (ref 12.0–15.0)
MCH: 30.8 pg (ref 26.0–34.0)
MCHC: 33.6 g/dL (ref 30.0–36.0)
MCV: 91.6 fL (ref 80.0–100.0)
Platelets: 230 10*3/uL (ref 150–400)
RBC: 3.47 MIL/uL — ABNORMAL LOW (ref 3.87–5.11)
RDW: 12.5 % (ref 11.5–15.5)
WBC: 9 10*3/uL (ref 4.0–10.5)
nRBC: 0 % (ref 0.0–0.2)

## 2022-01-22 LAB — BASIC METABOLIC PANEL
Anion gap: 8 (ref 5–15)
BUN: 24 mg/dL — ABNORMAL HIGH (ref 8–23)
CO2: 24 mmol/L (ref 22–32)
Calcium: 9.7 mg/dL (ref 8.9–10.3)
Chloride: 107 mmol/L (ref 98–111)
Creatinine, Ser: 1.14 mg/dL — ABNORMAL HIGH (ref 0.44–1.00)
GFR, Estimated: 47 mL/min — ABNORMAL LOW (ref >60)
Glucose, Bld: 122 mg/dL — ABNORMAL HIGH (ref 70–99)
Potassium: 3.1 mmol/L — ABNORMAL LOW (ref 3.5–5.1)
Sodium: 139 mmol/L (ref 135–145)

## 2022-01-22 LAB — URINE CULTURE: Culture: NO GROWTH

## 2022-01-22 LAB — GLUCOSE, CAPILLARY: Glucose-Capillary: 110 mg/dL — ABNORMAL HIGH (ref 70–99)

## 2022-01-22 MED ORDER — HEPARIN SODIUM (PORCINE) 5000 UNIT/ML IJ SOLN
5000.0000 [IU] | Freq: Three times a day (TID) | INTRAMUSCULAR | Status: DC
  Administered 2022-01-22 – 2022-01-25 (×9): 5000 [IU] via SUBCUTANEOUS
  Filled 2022-01-22 (×9): qty 1

## 2022-01-22 MED ORDER — SODIUM CHLORIDE 0.9 % IV SOLN: INTRAVENOUS | Status: DC | PRN

## 2022-01-22 MED ORDER — POTASSIUM CHLORIDE CRYS ER 20 MEQ PO TBCR
40.0000 meq | EXTENDED_RELEASE_TABLET | Freq: Two times a day (BID) | ORAL | Status: AC
  Administered 2022-01-22 (×2): 40 meq via ORAL
  Filled 2022-01-22 (×2): qty 2

## 2022-01-22 NOTE — Evaluation (Signed)
Physical Therapy Evaluation Patient Details Name: Roberta Ross MRN: [id number] DOB: 1933-05-07 Today's Date: 01/22/2022  History of Present Illness  Pt is an 88 yo female that presented to ED for SOB, L sided chest pain, mild cough. PMH of CHF with EF 30%, HTN, anxiety,  HLD, RA with severe finger deformety, CKD stage IIIb, atrial fibrillation not on anticoagulants and history of subarachnoid hemorrhage.   Clinical Impression  Patient alert, agreeable to PT with encouragement and education. Initially denied pain but with mobility did reference L sided chest/rib pain. Pt stated at baseline she lives alone and is modI for ADLs/mobility, and a neighbor drives her to appointments.  The patient ultimately needed modA to come up into sitting. Self limiting in mobility stated "I can't" several times though with encouragement and cues, able to participate more than anticipated. Sit <> stand with SPC and minA, step pivot to [facility] with CGA and pericare performed in sitting. She did experience several posterior LOB (mild) while attempting pericare, minA to correct. She ambulated ~39f in room with SPC and CGA, generally fatigued and with slow cadence.  Overall the patient demonstrated deficits (see "PT Problem List") that impede the patient's functional abilities, safety, and mobility and would benefit from skilled PT intervention. Recommendation is SNF due to current level of assistance needed and change from PLOF.        Recommendations for follow up therapy are one component of a multi-disciplinary discharge planning process, led by the attending physician.  Recommendations may be updated based on patient status, additional functional criteria and insurance authorization.  Follow Up Recommendations Skilled nursing-short term rehab (<3 hours/day) Can patient physically be transported by private vehicle: Yes    Assistance Recommended at Discharge Frequent or constant Supervision/Assistance  Patient can  return home with the following  Assistance with cooking/housework;Assist for transportation;Help with stairs or ramp for entrance;A lot of help with walking and/or transfers;A lot of help with bathing/dressing/bathroom    Equipment Recommendations Other (comment) (TBD)  Recommendations for Other Services       Functional Status Assessment Patient has had a recent decline in their functional status and demonstrates the ability to make significant improvements in function in a reasonable and predictable amount of time.     Precautions / Restrictions Precautions Precautions: Fall Restrictions Weight Bearing Restrictions: No      Mobility  Bed Mobility Overal bed mobility: Needs Assistance Bed Mobility: Supine to Sit     Supine to sit: Mod assist          Transfers Overall transfer level: Needs assistance Equipment used: Straight cane Transfers: Sit to/from Stand, Bed to chair/wheelchair/BSC Sit to Stand: Min assist   Step pivot transfers: Min guard            Ambulation/Gait Ambulation/Gait assistance: Min guard Gait Distance (Feet): 40 Feet Assistive device: Straight cane Gait Pattern/deviations: Shuffle, Knee flexed in stance - left, Knee flexed in stance - right, Decreased stride length, Decreased step length - right, Decreased step length - left          Stairs            Wheelchair Mobility    Modified Rankin (Stroke Patients Only)       Balance Overall balance assessment: Needs assistance Sitting-balance support: Feet supported Sitting balance-Leahy Scale: Good     Standing balance support: Single extremity supported Standing balance-Leahy Scale: Poor Standing balance comment: few instances of posterior LOB with standing pericare, minA to correct/steady pt  Pertinent Vitals/Pain Pain Assessment Pain Assessment: Faces Faces Pain Scale: Hurts little more Pain Location: L chest/rib pain Pain  Descriptors / Indicators: Grimacing, Moaning, Guarding, Sore, Sharp Pain Intervention(s): Limited activity within patient's tolerance, Monitored during session, Repositioned    Home Living Family/patient expects to be discharged to:: Private residence Living Arrangements: Alone Available Help at Discharge: Family;Neighbor (PRN, pt says she doesn't really have help) Type of Home: House Home Access: Stairs to enter Entrance Stairs-Rails: Right;Left;Can reach both Entrance Stairs-Number of Steps: 2   Home Layout: One level Home Equipment: [place] - single point;Other (comment) (adapted seated shower with shower head)      Prior Function Prior Level of Function : Independent/Modified Independent             Mobility Comments: pt claims to be modI with SPC for ADLs ADLs Comments: neighbor drives for appts     Hand Dominance        Extremity/Trunk Assessment   Upper Extremity Assessment Upper Extremity Assessment: Generalized weakness (RA of bilateral hands)    Lower Extremity Assessment Lower Extremity Assessment: Generalized weakness       Communication      Cognition Arousal/Alertness: Awake/alert Behavior During Therapy: WFL for tasks assessed/performed, Anxious Overall Cognitive Status: Within Functional Limits for tasks assessed                                 General Comments: followed all commands, somewhat anxious due to pain        General Comments      Exercises     Assessment/Plan    PT Assessment Patient needs continued PT services  PT Problem List Decreased strength;Decreased mobility;Decreased activity tolerance;Decreased balance;Decreased knowledge of use of DME       PT Treatment Interventions DME instruction;Therapeutic exercise;Gait training;Balance training;Neuromuscular re-education;Stair training;Functional mobility training;Therapeutic activities;Patient/family education    PT Goals (Current goals can be found in the Care  Plan section)  Acute Rehab PT Goals Patient Stated Goal: to go home PT Goal Formulation: With patient Time For Goal Achievement: 02/05/22 Potential to Achieve Goals: Good    Frequency Min 2X/week     Co-evaluation               AM-PAC PT "6 Clicks" Mobility  Outcome Measure Help needed turning from your back to your side while in a flat bed without using bedrails?: A Lot Help needed moving from lying on your back to sitting on the side of a flat bed without using bedrails?: A Lot Help needed moving to and from a bed to a chair (including a wheelchair)?: A Little Help needed standing up from a chair using your arms (e.g., wheelchair or bedside chair)?: A Little Help needed to walk in hospital room?: A Little Help needed climbing 3-5 steps with a railing? : A Lot 6 Click Score: 15    End of Session Equipment Utilized During Treatment: Gait belt Activity Tolerance: Patient tolerated treatment well Patient left: in chair;with chair alarm set;with call bell/phone within reach Nurse Communication: Mobility status PT Visit Diagnosis: Other abnormalities of gait and mobility (R26.89);Difficulty in walking, not elsewhere classified (R26.2);Muscle weakness (generalized) (M62.81)    Time: 5009-3818 PT Time Calculation (min) (ACUTE ONLY): 30 min   Charges:   PT Evaluation $PT Eval Low Complexity: 1 Low PT Treatments $Therapeutic Activity: 23-37 mins      Lieutenant Diego PT, DPT 3:46 PM,01/22/22

## 2022-01-22 NOTE — Progress Notes (Addendum)
PROGRESS NOTE    Roberta Ross  NUU:[id number] DOB: [birth year] DOA: 01/21/2022 PCP: Kirk Ruths, MD   Assessment & Plan:   Principal Problem:   CAP (community acquired pneumonia) Active Problems:   UTI (urinary tract infection)   Sepsis ([place])   Atrial fibrillation, chronic ([place])   Chronic systolic CHF (congestive heart failure) ([place])   Essential hypertension   COPD with chronic bronchitis ([place])   HLD (hyperlipidemia)   Chronic kidney disease, stage 3b ([place])  Assessment and Plan: CAP: continue on IV ceftiraxone, azithromycin, bronchodilators & encourage incentive spirometry. Legionella, strep ordered. Blood cxs NGTD   UTI: UA is positive. Urine cx is pending. Continue on IV rocephin   Sepsis: met criteria w/ tachycardia, tachypnea, & CAP. Continue on IV rocephin, azithromycin, bronchodilators & encourage incentive spirometry. Procal 0.14   Chronic a. fib: holding home dose of cardizem secondary low normal BP. Not on long term anticoagulation secondary to pt's hx of subarachnoid hemorrhage     Chronic systolic CHF: echo on 07/02/7423 showed EF of 30%. CHF appears to be compensated. Holding lasix.   HTN: holding home dose of cardizem, lasix to low normal BP. IV hydralazine prn    COPD: with chronic bronchitis. Continue on azithromycin, bronchodilators & encourage incentive spirometry  HLD: continue on statin    CKDIIIb: Cr is trending down from day prior. Avoid nephrotoxic meds  Generalized weakness: PT consulted      DVT prophylaxis: heparin SQ Code Status: DNR Family Communication: discussed pt's care w/ pt's daughter, Butch Penny, and answered her questions Disposition Plan: likely d/c back home   Level of care: Telemetry Medical   Status is: Inpatient Remains inpatient appropriate because: severity of illness    Consultants:    Procedures:  Antimicrobials: azithromycin, rocephin   Subjective: Pt c/o intermittent nausea.   Objective: Vitals:    01/21/22 1750 01/21/22 1928 01/22/22 0330 01/22/22 0745  BP: 105/62 109/71 118/87 100/62  Pulse: 69 81 89 89  Resp: 18 20 16 18   Temp: 97.6 F (36.4 C) 97.7 F (36.5 C) 98.9 F (37.2 C) 98.3 F (36.8 C)  TempSrc:  Oral Oral   SpO2: 100% 94% 95% 91%  Weight:      Height:        Intake/Output Summary (Last 24 hours) at 01/22/2022 0757 Last data filed at 01/22/2022 0340 Gross per 24 hour  Intake 2700 ml  Output 350 ml  Net 2350 ml   Filed Weights   01/21/22 1110  Weight: 64 kg    Examination:  General exam: Appears calm but uncomfortable  Respiratory system: diminished breath sounds b/l. Cardiovascular system: S1 & S2+. No rubs, gallops or clicks Gastrointestinal system: Abdomen is nondistended, soft and nontender. Normal bowel sounds heard. Central nervous system: Alert and oriented. Moves all extremities  Psychiatry: Judgement and insight appear normal. Appears frustrated & agitated      Data Reviewed: I have personally reviewed following labs and imaging studies  CBC: Recent Labs  Lab 01/21/22 1113 01/22/22 0436  WBC 11.9* 9.0  HGB 13.1 10.7*  HCT 39.1 31.8*  MCV 92.4 91.6  PLT 278 956   Basic Metabolic Panel: Recent Labs  Lab 01/21/22 1113 01/22/22 0436  NA 139 139  K 3.5 3.1*  CL 102 107  CO2 23 24  GLUCOSE 161* 122*  BUN 26* 24*  CREATININE 1.33* 1.14*  CALCIUM 10.3 9.7   GFR: Estimated Creatinine Clearance (by C-G formula based on SCr of 1.14 mg/dL (H)) Female: 60.5  mL/min (A) Female: 41.2 mL/min (A) Liver Function Tests: Recent Labs  Lab 01/21/22 1135  AST 30  ALT 22  ALKPHOS 111  BILITOT 1.3*  PROT 7.9  ALBUMIN 3.3*   No results for input(s): "LIPASE", "AMYLASE" in the last 168 hours. No results for input(s): "AMMONIA" in the last 168 hours. Coagulation Profile: Recent Labs  Lab 01/21/22 1154  INR 1.2   Cardiac Enzymes: No results for input(s): "CKTOTAL", "CKMB", "CKMBINDEX", "TROPONINI" in the last 168 hours. BNP (last 3  results) No results for input(s): "PROBNP" in the last 8760 hours. HbA1C: No results for input(s): "HGBA1C" in the last 72 hours. CBG: No results for input(s): "GLUCAP" in the last 168 hours. Lipid Profile: No results for input(s): "CHOL", "HDL", "LDLCALC", "TRIG", "CHOLHDL", "LDLDIRECT" in the last 72 hours. Thyroid Function Tests: No results for input(s): "TSH", "T4TOTAL", "FREET4", "T3FREE", "THYROIDAB" in the last 72 hours. Anemia Panel: No results for input(s): "VITAMINB12", "FOLATE", "FERRITIN", "TIBC", "IRON", "RETICCTPCT" in the last 72 hours. Sepsis Labs: Recent Labs  Lab 01/21/22 1113 01/21/22 1151 01/21/22 1415  PROCALCITON 0.14  --   --   LATICACIDVEN  --  1.9 1.1    Recent Results (from the past 240 hour(s))  Resp Panel by RT-PCR (Flu A&B, Covid) Anterior Nasal Swab     Status: None   Collection Time: 01/21/22 11:51 AM   Specimen: Anterior Nasal Swab  Result Value Ref Range Status   SARS Coronavirus 2 by RT PCR NEGATIVE NEGATIVE Final    Comment: (NOTE) SARS-CoV-2 target nucleic acids are NOT DETECTED.  The SARS-CoV-2 RNA is generally detectable in upper respiratory specimens during the acute phase of infection. The lowest concentration of SARS-CoV-2 viral copies this assay can detect is 138 copies/mL. A negative result does not preclude SARS-Cov-2 infection and should not be used as the sole basis for treatment or other patient management decisions. A negative result may occur with  improper specimen collection/handling, submission of specimen other than nasopharyngeal swab, presence of viral mutation(s) within the areas targeted by this assay, and inadequate number of viral copies(<138 copies/mL). A negative result must be combined with clinical observations, patient history, and epidemiological information. The expected result is Negative.  Fact Sheet for Patients:  EntrepreneurPulse.com.au  Fact Sheet for Healthcare Providers:   IncredibleEmployment.be  This test is no t yet approved or cleared by the [place] FDA and  has been authorized for detection and/or diagnosis of SARS-CoV-2 by FDA under an Emergency Use Authorization (EUA). This EUA will remain  in effect (meaning this test can be used) for the duration of the COVID-19 declaration under Section 564(b)(1) of the Act, 21 U.S.C.section 360bbb-3(b)(1), unless the authorization is terminated  or revoked sooner.       Influenza A by PCR NEGATIVE NEGATIVE Final   Influenza B by PCR NEGATIVE NEGATIVE Final    Comment: (NOTE) The Xpert Xpress SARS-CoV-2/FLU/RSV plus assay is intended as an aid in the diagnosis of influenza from Nasopharyngeal swab specimens and should not be used as a sole basis for treatment. Nasal washings and aspirates are unacceptable for Xpert Xpress SARS-CoV-2/FLU/RSV testing.  Fact Sheet for Patients: EntrepreneurPulse.com.au  Fact Sheet for Healthcare Providers: IncredibleEmployment.be  This test is not yet approved or cleared by the [place] FDA and has been authorized for detection and/or diagnosis of SARS-CoV-2 by FDA under an Emergency Use Authorization (EUA). This EUA will remain in effect (meaning this test can be used) for the duration of the COVID-19 declaration under  Section 564(b)(1) of the Act, 21 U.S.C. section 360bbb-3(b)(1), unless the authorization is terminated or revoked.  Performed at [facility], [address] [place], [place] [address]   Blood Culture (routine x 2)     Status: None (Preliminary result)   Collection Time: 01/21/22 11:51 AM   Specimen: BLOOD  Result Value Ref Range Status   Specimen Description BLOOD LEFT ANTECUBITAL  Final   Special Requests   Final    BOTTLES DRAWN AEROBIC AND ANAEROBIC Blood Culture adequate volume   Culture   Final    NO GROWTH < 24 HOURS Performed at [facility], [address], [place], [place] [address]    Report Status PENDING  Incomplete  Blood Culture (routine x 2)     Status: None (Preliminary result)   Collection Time: 01/21/22 11:51 AM   Specimen: BLOOD  Result Value Ref Range Status   Specimen Description BLOOD BLOOD LEFT FOREARM  Final   Special Requests   Final    BOTTLES DRAWN AEROBIC AND ANAEROBIC Blood Culture adequate volume   Culture   Final    NO GROWTH < 24 HOURS Performed at [facility], [address], [place], [place] [address]    Report Status PENDING  Incomplete         Radiology Studies: CT ABDOMEN PELVIS W CONTRAST  Result Date: 01/21/2022 CLINICAL DATA:  88 year old female with LEFT LOWER abdominal and pelvic pain with fever. EXAM: CT ABDOMEN AND PELVIS WITH CONTRAST TECHNIQUE: Multidetector CT imaging of the abdomen and pelvis was performed using the standard protocol following bolus administration of intravenous contrast. RADIATION DOSE REDUCTION: This exam was performed according to the departmental dose-optimization program which includes automated exposure control, adjustment of the mA and/or kV according to patient size and/or use of iterative reconstruction technique. CONTRAST:  49m OMNIPAQUE IOHEXOL 300 MG/ML  SOLN COMPARISON:  04/07/2020 CT FINDINGS: Lower chest: Airspace opacities and focal consolidation within the LEFT LOWER lobe likely represents pneumonia. A trace LEFT pleural effusion is noted. Hepatobiliary: The liver is unremarkable. The patient is status post cholecystectomy. CBD is slightly decreased in caliber now measuring 12 mm, previously 15 mm. Pancreas: Unremarkable Spleen: Unremarkable Adrenals/Urinary Tract: Mild bilateral renal atrophy, cortical thinning and bilateral renal cysts are again identified and no follow-up imaging recommended. There is no evidence of hydronephrosis. The adrenal glands and bladder are unremarkable. Stomach/Bowel: Stomach is within normal limits. Appendix not identified. No  evidence of bowel wall thickening, distention, or inflammatory changes. Colonic diverticulosis identified without evidence of acute diverticulitis. Vascular/Lymphatic: Irregular calcified and noncalcified atherosclerotic plaque along the LEFT descending thoracic aorta and RIGHT abdominal aorta again noted, slightly increased 2021. The descending thoracic aorta is unchanged in caliber measuring 3.7 cm. There is no evidence of abdominal aortic aneurysm. No abnormal lymph nodes are identified. Reproductive: Status post hysterectomy. No adnexal masses. Other: No ascites, focal collection or pneumoperitoneum. A small LEFT inguinal hernia containing small bowel loops noted, without bowel obstruction. Musculoskeletal: Lumbar spine scoliosis and degenerative changes again identified. Fixation hardware within the proximal LEFT femur again noted. Chronic 30% compression fracture of L5 again identified. IMPRESSION: 1. LEFT LOWER lobe pneumonia with trace LEFT pleural effusion. Imaging follow-up to resolution recommended. 2. Small LEFT inguinal hernia containing small bowel loop, without bowel obstruction. 3. Unchanged descending thoracic aortic aneurysm measuring 3.7 cm. 4. Decreased CBD dilatation from 2021. 5. Aortic Atherosclerosis (ICD10-I70.0). Electronically Signed   By: JMargarette CanadaM.D.   On: 01/21/2022 14:22  DG Chest Port 1 View  Result Date: 01/21/2022 CLINICAL DATA:  Fever and weakness. EXAM: PORTABLE CHEST 1 VIEW COMPARISON:  04/07/2020 prior studies FINDINGS: LEFT LOWER lung opacities noted suspicious for pneumonia. The cardiomediastinal silhouette is unchanged. Mild interstitial prominence is again noted. There is no evidence of pneumothorax or large pleural effusion. No acute bony abnormalities are identified. IMPRESSION: LEFT LOWER lung opacities suspicious for pneumonia. Radiographic follow-up to resolution is recommended. Electronically Signed   By: Margarette Canada M.D.   On: 01/21/2022 12:35         Scheduled Meds:  potassium chloride  40 mEq Oral BID   simvastatin  20 mg Oral Daily   Continuous Infusions:  sodium chloride 10 mL/hr at 01/22/22 0455   azithromycin Stopped (01/21/22 1623)   cefTRIAXone (ROCEPHIN)  IV 2 g (01/22/22 0500)     LOS: 1 day    Time spent: 35 mins     Wyvonnia Dusky, MD [facility] Pager [phone]  If 7PM-7AM, please contact night-coverage [email or web address] 01/22/2022, 7:57 AM

## 2022-01-23 DIAGNOSIS — R531 Weakness: Secondary | ICD-10-CM

## 2022-01-23 DIAGNOSIS — J189 Pneumonia, unspecified organism: Secondary | ICD-10-CM | POA: Diagnosis not present

## 2022-01-23 DIAGNOSIS — N1832 Chronic kidney disease, stage 3b: Secondary | ICD-10-CM | POA: Diagnosis not present

## 2022-01-23 LAB — CBC
HCT: 31.9 % — ABNORMAL LOW (ref 36.0–46.0)
Hemoglobin: 10.5 g/dL — ABNORMAL LOW (ref 12.0–15.0)
MCH: 30.8 pg (ref 26.0–34.0)
MCHC: 32.9 g/dL (ref 30.0–36.0)
MCV: 93.5 fL (ref 80.0–100.0)
Platelets: 234 10*3/uL (ref 150–400)
RBC: 3.41 MIL/uL — ABNORMAL LOW (ref 3.87–5.11)
RDW: 12.7 % (ref 11.5–15.5)
WBC: 7.1 10*3/uL (ref 4.0–10.5)
nRBC: 0 % (ref 0.0–0.2)

## 2022-01-23 LAB — BASIC METABOLIC PANEL
Anion gap: 3 — ABNORMAL LOW (ref 5–15)
BUN: 19 mg/dL (ref 8–23)
CO2: 25 mmol/L (ref 22–32)
Calcium: 9.7 mg/dL (ref 8.9–10.3)
Chloride: 112 mmol/L — ABNORMAL HIGH (ref 98–111)
Creatinine, Ser: 1.03 mg/dL — ABNORMAL HIGH (ref 0.44–1.00)
GFR, Estimated: 53 mL/min — ABNORMAL LOW (ref >60)
Glucose, Bld: 105 mg/dL — ABNORMAL HIGH (ref 70–99)
Potassium: 4.2 mmol/L (ref 3.5–5.1)
Sodium: 140 mmol/L (ref 135–145)

## 2022-01-23 LAB — STREP PNEUMONIAE URINARY ANTIGEN: Strep Pneumo Urinary Antigen: NEGATIVE

## 2022-01-23 MED ORDER — DOCUSATE SODIUM 100 MG PO CAPS
200.0000 mg | ORAL_CAPSULE | Freq: Two times a day (BID) | ORAL | Status: DC
  Administered 2022-01-23 – 2022-01-25 (×5): 200 mg via ORAL
  Filled 2022-01-23 (×6): qty 2

## 2022-01-23 NOTE — Plan of Care (Signed)
  Problem: Pain Managment: Goal: General experience of comfort will improve Outcome: Progressing   Problem: Safety: Goal: Ability to remain free from injury will improve Outcome: Progressing   Problem: Skin Integrity: Goal: Risk for impaired skin integrity will decrease Outcome: Progressing   

## 2022-01-23 NOTE — TOC Initial Note (Signed)
Transition of Care [facility]) - Initial/Assessment Note    Patient Details  Name: Roberta Ross MRN: [id number] Date of Birth: 1933/06/03  [facility]) CM/SW Contact:    Beverly Sessions, RN Phone Number: 01/23/2022, 3:06 PM  Clinical Narrative:                    Admitted for: CAP Admitted from: home alone PCP: Ouida Sills  Current home health/prior home health/DME:Cane.  Patient confirms she has have Rw at home  Therapy recommending SNF.  Patient adamantly declines.  Discussed option of home health.Patient also declines.  Patient gives permission for me to update her daughter.  Daughter updated        Patient Goals and CMS Choice        Expected Discharge Plan and Services                                                Prior Living Arrangements/Services                       Activities of Daily Living Home Assistive Devices/Equipment: Cane (specify quad or straight) ADL Screening (condition at time of admission) Patient's cognitive ability adequate to safely complete daily activities?: Yes Is the patient deaf or have difficulty hearing?: No Does the patient have difficulty seeing, even when wearing glasses/contacts?: No Does the patient have difficulty concentrating, remembering, or making decisions?: No Patient able to express need for assistance with ADLs?: Yes Does the patient have difficulty dressing or bathing?: Yes Independently performs ADLs?: No Communication: Independent Dressing (OT): Needs assistance Is this a change from baseline?: Change from baseline, expected to last <3days Grooming: Needs assistance Is this a change from baseline?: Change from baseline, expected to last <3 days Feeding: Independent Bathing: Needs assistance Is this a change from baseline?: Change from baseline, expected to last <3 days Toileting: Needs assistance Is this a change from baseline?: Change from baseline, expected to last <3 days In/Out  Bed: Needs assistance Is this a change from baseline?: Change from baseline, expected to last <3 days Walks in Home: Needs assistance Is this a change from baseline?: Change from baseline, expected to last <3 days Does the patient have difficulty walking or climbing stairs?: Yes Weakness of Legs: Both Weakness of Arms/Hands: Both  Permission Sought/Granted                  Emotional Assessment              Admission diagnosis:  Lower urinary tract infectious disease [N39.0] CAP (community acquired pneumonia) [J18.9] Sepsis due to pneumonia ([place]) [Y07.3, A41.9] Patient Active Problem List   Diagnosis Date Noted   CAP (community acquired pneumonia) 01/21/2022   Sepsis ([place]) 71/09/2692   Chronic systolic CHF (congestive heart failure) ([place]) 01/21/2022   UTI (urinary tract infection) 01/21/2022   HLD (hyperlipidemia) 01/21/2022   Chronic kidney disease, stage 3b ([place]) 01/21/2022   Sepsis due to pneumonia ([place])    COPD with chronic bronchitis ([place]) 03/27/2020   Closed fracture of femur, intertrochanteric, left, initial encounter ([place]) 03/27/2020   Accidental fall 03/27/2020   Preoperative clearance 03/27/2020   Closed left hip fracture, initial encounter ([place]) 03/27/2020   Simple chronic bronchitis ([place]) 09/01/2018   Atrial fibrillation, chronic ([place]) 09/16/2017   History of subarachnoid hemorrhage 07/21/2017  Prediabetes 03/21/2017   GAD (generalized anxiety disorder) 02/01/2016   Hypercalcemia 02/01/2016   Essential hypertension 01/04/2015   Stage 3b chronic kidney disease ([place]) 08/24/2014   Pure hypercholesterolemia 02/09/2014   H/O rheumatoid arthritis 10/20/2013   PCP:  Kirk Ruths, MD Pharmacy:   [facility] [place], [place] - [address] AT [facility] [address] [place] [address] Phone: [phone] Fax: [phone]     Social Determinants of Health (SDOH) Interventions Housing Interventions: Intervention Not  Indicated  Readmission Risk Interventions     No data to display

## 2022-01-23 NOTE — Evaluation (Signed)
Occupational Therapy Evaluation Patient Details Name: Roberta Ross MRN: [id number] DOB: 11-Apr-1934 Today's Date: 01/23/2022   History of Present Illness Pt is an 88 yo female that presented to ED for SOB, L sided chest pain, mild cough. PMH of CHF with EF 30%, HTN, anxiety,  HLD, RA with severe finger deformety, CKD stage IIIb, atrial fibrillation not on anticoagulants and history of subarachnoid hemorrhage.   Clinical Impression   Patient presenting with decreased independence in self-care, functional mobility, safety, and endurance. Patient supine in bed upon arrival and agreeable to OT services. Pt reports she lives at home alone and her daughter and granddaughter comes in and assist her as needed. She also reports her daughter brings her groceries and that her neighbor drives her to her appointments. She has a shower chair, SPC, RW, and regular height toilet. Patient with c/o chest pain. At baseline patient reports she is modI with ADLs. Patient currently functioning at min A for bed mobility and transfer from sit <> stand. Pt was able to transfer to [facility] and perform peri-care with min G/ supervision.Min A for clothing management due to decreased endurance.  HR fluctuating and monitored during activity 144 with activity, when resting HR decreased with rest. Patient was also able to transfer to recliner with min g/ supervision. Patient left in recliner with chair alarm set, call bell in reach, and all needs met. Patient will benefit from acute OT to increase overall independence in the areas of ADLs, functional mobility,  in order to safely discharge to the next venue of care.       Recommendations for follow up therapy are one component of a multi-disciplinary discharge planning process, led by the attending physician.  Recommendations may be updated based on patient status, additional functional criteria and insurance authorization.   Follow Up Recommendations  Skilled nursing-short term rehab  (<3 hours/day)    Assistance Recommended at Discharge Intermittent Supervision/Assistance  Patient can return home with the following A little help with walking and/or transfers;Assist for transportation;A little help with bathing/dressing/bathroom;Assistance with cooking/housework    Functional Status Assessment  Patient has had a recent decline in their functional status and/or demonstrates limited ability to make significant improvements in function in a reasonable and predictable amount of time  Equipment Recommendations  Other (comment) (Defer to next venue of care.)    Recommendations for Other Services       Precautions / Restrictions Precautions Precautions: Fall Restrictions Weight Bearing Restrictions: No      Mobility Bed Mobility Overal bed mobility: Needs Assistance Bed Mobility: Supine to Sit     Supine to sit: Min assist          Transfers Overall transfer level: Needs assistance Equipment used: Straight cane Transfers: Sit to/from Stand Sit to Stand: Min assist                  Balance Overall balance assessment: Needs assistance Sitting-balance support: Feet supported Sitting balance-Leahy Scale: Good     Standing balance support: Single extremity supported, During functional activity Standing balance-Leahy Scale: Fair                             ADL either performed or assessed with clinical judgement   ADL Overall ADL's : Needs assistance/impaired                     Lower Body Dressing: Minimal assistance   Toilet Transfer: Supervision/safety;Min  guard   Toileting- Clothing Manipulation and Hygiene: Supervision/safety;Min guard                Pertinent Vitals/Pain Pain Assessment Pain Assessment: Faces Faces Pain Scale: Hurts little more Pain Location: L chest/rib pain Pain Descriptors / Indicators: Grimacing, Moaning, Guarding, Sore Pain Intervention(s): Limited activity within patient's  tolerance, Monitored during session, Repositioned     Extremity/Trunk Assessment Upper Extremity Assessment Upper Extremity Assessment: Generalized weakness   Lower Extremity Assessment Lower Extremity Assessment: Generalized weakness          Cognition Arousal/Alertness: Awake/alert Behavior During Therapy: WFL for tasks assessed/performed, Anxious Overall Cognitive Status: Within Functional Limits for tasks assessed                                                  Home Living Family/patient expects to be discharged to:: Private residence Living Arrangements: Alone Available Help at Discharge: Family;Neighbor Type of Home: House Home Access: Stairs to enter [employer] of Steps: 2 Entrance Stairs-Rails: Right;Left;Can reach both Home Layout: One level     Bathroom Shower/Tub: Occupational psychologist: Standard     Home Equipment: Shower seat;Cane - single Barista (2 wheels)          Prior Functioning/Environment Prior Level of Function : Independent/Modified Independent             Mobility Comments: pt claims to be modI with SPC for ADLs ADLs Comments: neighbor drives for appts        OT Problem List: Decreased strength;Impaired balance (sitting and/or standing);Pain;Decreased safety awareness;Decreased activity tolerance;Decreased knowledge of use of DME or AE      OT Treatment/Interventions: Self-care/ADL training;Therapeutic exercise;Therapeutic activities;DME and/or AE instruction    OT Goals(Current goals can be found in the care plan section) Acute Rehab OT Goals Patient Stated Goal: to return home. OT Goal Formulation: With patient Time For Goal Achievement: 02/06/22 Potential to Achieve Goals: Good ADL Goals Pt Will Perform Grooming: with supervision Pt Will Perform Lower Body Bathing: with supervision Pt Will Perform Lower Body Dressing: with supervision Pt Will Transfer to Toilet: with  supervision Pt Will Perform Toileting - Clothing Manipulation and hygiene: with supervision  OT Frequency: Min 2X/week       AM-PAC OT "6 Clicks" Daily Activity     Outcome Measure Help from another person eating meals?: None Help from another person taking care of personal grooming?: A Little Help from another person toileting, which includes using toliet, bedpan, or urinal?: A Little Help from another person bathing (including washing, rinsing, drying)?: A Little Help from another person to put on and taking off regular upper body clothing?: None Help from another person to put on and taking off regular lower body clothing?: A Little 6 Click Score: 20   End of Session Equipment Utilized During Treatment: Other (comment) [facility]) Nurse Communication: Mobility status  Activity Tolerance: Patient tolerated treatment well Patient left: in chair;with chair alarm set;with call bell/phone within reach  OT Visit Diagnosis: Unsteadiness on feet (R26.81);Muscle weakness (generalized) (M62.81);Pain Pain - Right/Left: Left Pain - part of body:  (Chest/rib area)                Time: 1030-1104 OT Time Calculation (min): 34 min Charges:  OT General Charges $OT Visit: 1 Visit OT Evaluation $OT Eval Moderate Complexity: 1  Mod OT Treatments $Self Care/Home Management : 23-37 mins    Arun Herrod, OTS 01/23/2022, 1:16 PM

## 2022-01-23 NOTE — TOC Progression Note (Signed)
Transition of Care [facility]) - Progression Note    Patient Details  Name: Roberta Ross MRN: [id number] Date of Birth: 1933/09/05  [facility]) CM/SW Contact  Beverly Sessions, RN Phone Number: 01/23/2022, 4:11 PM  Clinical Narrative:     Notified by daughter that she has spoken with the patient and patient now agreeable to bed search.  Bed search initiated        Expected Discharge Plan and Services                                                 Social Determinants of Health (SDOH) Interventions Housing Interventions: Intervention Not Indicated  Readmission Risk Interventions     No data to display

## 2022-01-23 NOTE — NC FL2 (Signed)
[place] LEVEL OF CARE SCREENING TOOL     IDENTIFICATION  Patient Name: Roberta Ross Birthdate: 1933/11/12 Sex: adult Admission Date (Current Location): 01/21/2022  [facility] and [place] Number:  Engineering geologist and Address:  [facility], [address] [place], [place] [address]      Provider Number: [id number]  Attending Physician Name and Address:  Wyvonnia Dusky, MD  Relative Name and Phone Number:  Butch Penny [phone]    Current Level of Care: Hospital Recommended Level of Care: [place] Prior Approval Number:    Date Approved/Denied:   PASRR Number: [id number]  Discharge Plan: SNF    Current Diagnoses: Patient Active Problem List   Diagnosis Date Noted   CAP (community acquired pneumonia) 01/21/2022   Sepsis ([place]) 25/08/3974   Chronic systolic CHF (congestive heart failure) ([place]) 01/21/2022   UTI (urinary tract infection) 01/21/2022   HLD (hyperlipidemia) 01/21/2022   Chronic kidney disease, stage 3b ([place]) 01/21/2022   Sepsis due to pneumonia [facility])    COPD with chronic bronchitis ([place]) 03/27/2020   Closed fracture of femur, intertrochanteric, left, initial encounter ([place]) 03/27/2020   Accidental fall 03/27/2020   Preoperative clearance 03/27/2020   Closed left hip fracture, initial encounter ([place]) 03/27/2020   Simple chronic bronchitis ([place]) 09/01/2018   Atrial fibrillation, chronic ([place]) 09/16/2017   History of subarachnoid hemorrhage 07/21/2017   Prediabetes 03/21/2017   GAD (generalized anxiety disorder) 02/01/2016   Hypercalcemia 02/01/2016   Essential hypertension 01/04/2015   Stage 3b chronic kidney disease ([place]) 08/24/2014   Pure hypercholesterolemia 02/09/2014   H/O rheumatoid arthritis 10/20/2013    Orientation RESPIRATION BLADDER Height & Weight     Self, Time, Situation, Place  Normal Incontinent, External catheter Weight: 141 lb 1.5 oz (64 kg) Height:  '5\' 5"'$  (165.1 cm)   BEHAVIORAL SYMPTOMS/MOOD NEUROLOGICAL BOWEL NUTRITION STATUS      Incontinent Diet (see discharge summary)  AMBULATORY STATUS COMMUNICATION OF NEEDS Skin   Limited Assist Verbally Normal                       Personal Care Assistance Level of Assistance  Bathing, Dressing, Feeding, Total care Bathing Assistance: Limited assistance Feeding assistance: Independent Dressing Assistance: Limited assistance Total Care Assistance: Limited assistance   Functional Limitations Info  Sight, Hearing, Speech Sight Info: Adequate Hearing Info: Adequate Speech Info: Adequate    SPECIAL CARE FACTORS FREQUENCY  PT (By licensed PT), OT (By licensed OT)     PT Frequency: min 4x weekly OT Frequency: min 4x weekly            Contractures Contractures Info: Not present    Additional Factors Info  Code Status, Allergies Code Status Info: DNR Allergies Info: Metoprolol   Penicillins   Methotrexate Derivatives   Doxycycline Hyclate           Current Medications (01/23/2022):  This is the current hospital active medication list Current Facility-Administered Medications  Medication Dose Route Frequency Provider Last Rate Last Admin   0.9 %  sodium chloride infusion   Intravenous PRN Ivor Costa, MD 10 mL/hr at 01/23/22 0314 Infusion Verify at 01/23/22 0314   acetaminophen (TYLENOL) tablet 650 mg  650 mg Oral Q6H PRN Ivor Costa, MD   650 mg at 01/22/22 2249   albuterol (PROVENTIL) (2.5 MG/3ML) 0.083% nebulizer solution 2.5 mg  2.5 mg Inhalation Q4H PRN Ivor Costa, MD       azithromycin (ZITHROMAX) 500 mg in  sodium chloride 0.9 % 250 mL IVPB  500 mg Intravenous Q24H Ivor Costa, MD 250 mL/hr at 01/23/22 1420 500 mg at 01/23/22 1420   cefTRIAXone (ROCEPHIN) 2 g in sodium chloride 0.9 % 100 mL IVPB  2 g Intravenous Q24H Ivor Costa, MD 200 mL/hr at 01/23/22 0610 2 g at 01/23/22 0610   dextromethorphan-guaiFENesin (Loyal DM) 30-600 MG per 12 hr tablet 1 tablet  1 tablet Oral BID PRN Ivor Costa,  MD       docusate sodium (COLACE) capsule 200 mg  200 mg Oral BID Wyvonnia Dusky, MD   200 mg at 01/23/22 1026   heparin injection 5,000 Units  5,000 Units Subcutaneous Q8H Wyvonnia Dusky, MD   5,000 Units at 01/23/22 1415   hydrALAZINE (APRESOLINE) injection 5 mg  5 mg Intravenous Q2H PRN Ivor Costa, MD       ondansetron [facility]) injection 4 mg  4 mg Intravenous Q8H PRN Ivor Costa, MD   4 mg at 01/23/22 0357   simvastatin (ZOCOR) tablet 20 mg  20 mg Oral Daily Ivor Costa, MD   20 mg at 01/23/22 1026     Discharge Medications: Please see discharge summary for a list of discharge medications.  Relevant Imaging Results:  Relevant Lab Results:   Additional Information SSN: [id number]  Alberteen Sam, LCSW

## 2022-01-23 NOTE — Progress Notes (Signed)
PROGRESS NOTE   HPI was taken from Dr. Blaine Hamper: Roberta Ross is a 88 y.o. adult with medical history significant of sCHF with EF 30%, HTN, anxiety,  HLD, RA with severe finger deformety, CKD stage IIIb, atrial fibrillation not on anticoagulants and history of subarachnoid hemorrhage, who presents with SOB, left sided chest pain and mild cough.   Patient states that she has been sick for more than 3 days.  She has subjective fever and chills, but her body temperature is 97.4 in ED.  She has mild dry cough, mild shortness of breath and left-sided chest pain.  The chest pain is sharp, mild to moderate, nonradiating, pleuritic, aggravated by deep breathing and cough.  Patient also has nausea, no vomiting or abdominal pain.  She had diarrhea initially, which has resolved.  Currently no diarrhea.  Denies dysuria or burning on urination.  Patient states that she urinates little more often at night.  She has poor appetite and decreased oral intake.   Data reviewed independently and ED Course: pt was found to have WBC 11.9, lactic acid 1.9, 1.1, INR 1.2, PTT 33, positive urinalysis (clear appearance, trace amount of leukocyte, positive nitrite, many bacteria, WBC 21-50), negative COVID PCR, stable renal function, temperature 97.4, soft blood pressure 94/77, 103/73, heart rate 109, RR 31, oxygen saturation 96% on room air.  Chest x-ray showed left lower lobe infiltration.  CT scan of abdomen/pelvis also showed left lower lobe infiltration and a stable 3.7 cm of thoracic aortic aneurysm.  Patient is admitted to telemetry bed as inpatient.   CT-abd/pelvis: 1. LEFT LOWER lobe pneumonia with trace LEFT pleural effusion. Imaging follow-up to resolution recommended. 2. Small LEFT inguinal hernia containing small bowel loop, without bowel obstruction. 3. Unchanged descending thoracic aortic aneurysm measuring 3.7 cm. 4. Decreased CBD dilatation from 2021. 5. Aortic Atherosclerosis (ICD10-I70.0).    [name]  ZSW:[id number] DOB: [birth year] DOA: 01/21/2022 PCP: Kirk Ruths, MD   Assessment & Plan:   Principal Problem:   CAP (community acquired pneumonia) Active Problems:   UTI (urinary tract infection)   Sepsis ([place])   Atrial fibrillation, chronic ([place])   Chronic systolic CHF (congestive heart failure) ([place])   Essential hypertension   COPD with chronic bronchitis ([place])   HLD (hyperlipidemia)   Chronic kidney disease, stage 3b ([place])  Assessment and Plan: CAP: c/o pleuritic chest pain today. Continue on IV rocephin, azithromycin, bronchodilators & encourage incentive spirometry. Strep, legionella are pending. Blood cxs NGTD   UTI: UA is positive. Urine cx shows no growth. Possibly started abxs before obtaining cx. Continue on IV rocephin    Sepsis: met criteria w/ tachycardia, tachypnea, & CAP. Continue on IV rocephin, azithromycin, bronchodilators & encourage incentive spirometry. Procal 0.14. Sepsis resolved    Chronic a. fib: holding home dose of cardizem secondary to low normal BP. Can likely restart these medication tomorrow. Not on long term anticoagulation secondary to pt's hx of subarachnoid hemorrhage    Chronic systolic CHF: echo on 06/30/2023 showed EF of 30%. CHF appears compensated. Monitor I/Os   HTN: holding home dose of cardizem, lasix secondary to low normal BP. Can likely restart these meds tomorrow. IV hydralazine prn    COPD: with chronic bronchitis. Continue on azithromycin, bronchodilators & encourage incentive spirometry   HLD: continue on statin    CKDIIIb: Cr is trending down from day prior. Avoid nephrotoxic meds  Generalized weakness: PT recs SNF. Pt currently refuses SNF but pt's daughter, Butch Penny, will discuss SNF w/ pt this  evening      DVT prophylaxis: heparin SQ Code Status: DNR Family Communication: discussed pt's care w/ pt's daughter, Butch Penny, and answered her questions Disposition Plan: likely d/c back home   Level of care: Telemetry  Medical   Status is: Inpatient Remains inpatient appropriate because: severity of illness    Consultants:    Procedures:  Antimicrobials: azithromycin, rocephin   Subjective: Pt c/o pleuritic chest pain   Objective: Vitals:   01/22/22 0745 01/22/22 1700 01/22/22 1950 01/23/22 0359  BP: 100/62 114/88 122/73 118/77  Pulse: 89 88 98 93  Resp: 18 (!) 24 20 20   Temp: 98.3 F (36.8 C) 98.5 F (36.9 C) 98.4 F (36.9 C) 98 F (36.7 C)  TempSrc:  Oral Oral Oral  SpO2: 91% 96% 97% 95%  Weight:      Height:        Intake/Output Summary (Last 24 hours) at 01/23/2022 0747 Last data filed at 01/23/2022 0314 Gross per 24 hour  Intake 1053.17 ml  Output 1200 ml  Net -146.83 ml   Filed Weights   01/21/22 1110  Weight: 64 kg    Examination:  General exam: Appears uncomfortable. Frail appearing  Respiratory system: decreased breath sounds b/l  Cardiovascular system: S1/S2+. No rubs or gallops Gastrointestinal system: Abd is soft, NT, ND & hypoactive bowel sounds  Central nervous system: Alert and oriented. Moves all extremities  Psychiatry: Judgement and insight appears at baseline. Appears frustrated      Data Reviewed: I have personally reviewed following labs and imaging studies  CBC: Recent Labs  Lab 01/21/22 1113 01/22/22 0436 01/23/22 0519  WBC 11.9* 9.0 7.1  HGB 13.1 10.7* 10.5*  HCT 39.1 31.8* 31.9*  MCV 92.4 91.6 93.5  PLT 278 230 540   Basic Metabolic Panel: Recent Labs  Lab 01/21/22 1113 01/22/22 0436 01/23/22 0519  NA 139 139 140  K 3.5 3.1* 4.2  CL 102 107 112*  CO2 23 24 25   GLUCOSE 161* 122* 105*  BUN 26* 24* 19  CREATININE 1.33* 1.14* 1.03*  CALCIUM 10.3 9.7 9.7   GFR: Estimated Creatinine Clearance (by C-G formula based on SCr of 1.03 mg/dL (H)) Female: 35.9 mL/min (A) Female: 45.6 mL/min (A) Liver Function Tests: Recent Labs  Lab 01/21/22 1135  AST 30  ALT 22  ALKPHOS 111  BILITOT 1.3*  PROT 7.9  ALBUMIN 3.3*   No  results for input(s): "LIPASE", "AMYLASE" in the last 168 hours. No results for input(s): "AMMONIA" in the last 168 hours. Coagulation Profile: Recent Labs  Lab 01/21/22 1154  INR 1.2   Cardiac Enzymes: No results for input(s): "CKTOTAL", "CKMB", "CKMBINDEX", "TROPONINI" in the last 168 hours. BNP (last 3 results) No results for input(s): "PROBNP" in the last 8760 hours. HbA1C: No results for input(s): "HGBA1C" in the last 72 hours. CBG: Recent Labs  Lab 01/22/22 1711  GLUCAP 110*   Lipid Profile: No results for input(s): "CHOL", "HDL", "LDLCALC", "TRIG", "CHOLHDL", "LDLDIRECT" in the last 72 hours. Thyroid Function Tests: No results for input(s): "TSH", "T4TOTAL", "FREET4", "T3FREE", "THYROIDAB" in the last 72 hours. Anemia Panel: No results for input(s): "VITAMINB12", "FOLATE", "FERRITIN", "TIBC", "IRON", "RETICCTPCT" in the last 72 hours. Sepsis Labs: Recent Labs  Lab 01/21/22 1113 01/21/22 1151 01/21/22 1415  PROCALCITON 0.14  --   --   LATICACIDVEN  --  1.9 1.1    Recent Results (from the past 240 hour(s))  Resp Panel by RT-PCR (Flu A&B, Covid) Anterior Nasal Swab  Status: None   Collection Time: 01/21/22 11:51 AM   Specimen: Anterior Nasal Swab  Result Value Ref Range Status   SARS Coronavirus 2 by RT PCR NEGATIVE NEGATIVE Final    Comment: (NOTE) SARS-CoV-2 target nucleic acids are NOT DETECTED.  The SARS-CoV-2 RNA is generally detectable in upper respiratory specimens during the acute phase of infection. The lowest concentration of SARS-CoV-2 viral copies this assay can detect is 138 copies/mL. A negative result does not preclude SARS-Cov-2 infection and should not be used as the sole basis for treatment or other patient management decisions. A negative result may occur with  improper specimen collection/handling, submission of specimen other than nasopharyngeal swab, presence of viral mutation(s) within the areas targeted by this assay, and inadequate  number of viral copies(<138 copies/mL). A negative result must be combined with clinical observations, patient history, and epidemiological information. The expected result is Negative.  Fact Sheet for Patients:  EntrepreneurPulse.com.au  Fact Sheet for Healthcare Providers:  IncredibleEmployment.be  This test is no t yet approved or cleared by the [place] FDA and  has been authorized for detection and/or diagnosis of SARS-CoV-2 by FDA under an Emergency Use Authorization (EUA). This EUA will remain  in effect (meaning this test can be used) for the duration of the COVID-19 declaration under Section 564(b)(1) of the Act, 21 U.S.C.section 360bbb-3(b)(1), unless the authorization is terminated  or revoked sooner.       Influenza A by PCR NEGATIVE NEGATIVE Final   Influenza B by PCR NEGATIVE NEGATIVE Final    Comment: (NOTE) The Xpert Xpress SARS-CoV-2/FLU/RSV plus assay is intended as an aid in the diagnosis of influenza from Nasopharyngeal swab specimens and should not be used as a sole basis for treatment. Nasal washings and aspirates are unacceptable for Xpert Xpress SARS-CoV-2/FLU/RSV testing.  Fact Sheet for Patients: EntrepreneurPulse.com.au  Fact Sheet for Healthcare Providers: IncredibleEmployment.be  This test is not yet approved or cleared by the [place] FDA and has been authorized for detection and/or diagnosis of SARS-CoV-2 by FDA under an Emergency Use Authorization (EUA). This EUA will remain in effect (meaning this test can be used) for the duration of the COVID-19 declaration under Section 564(b)(1) of the Act, 21 U.S.C. section 360bbb-3(b)(1), unless the authorization is terminated or revoked.  Performed at [facility], [address] [place], [place] [address]   Blood Culture (routine x 2)     Status: None (Preliminary result)   Collection Time: 01/21/22 11:51  AM   Specimen: BLOOD  Result Value Ref Range Status   Specimen Description BLOOD LEFT ANTECUBITAL  Final   Special Requests   Final    BOTTLES DRAWN AEROBIC AND ANAEROBIC Blood Culture adequate volume   Culture   Final    NO GROWTH 2 DAYS Performed at [facility], [address] [place], [place] [address]    Report Status PENDING  Incomplete  Blood Culture (routine x 2)     Status: None (Preliminary result)   Collection Time: 01/21/22 11:51 AM   Specimen: BLOOD  Result Value Ref Range Status   Specimen Description BLOOD BLOOD LEFT FOREARM  Final   Special Requests   Final    BOTTLES DRAWN AEROBIC AND ANAEROBIC Blood Culture adequate volume   Culture   Final    NO GROWTH 2 DAYS Performed at [facility], [address], [place], [place] [address]    Report Status PENDING  Incomplete  Urine Culture     Status: None  Collection Time: 01/21/22 12:50 PM   Specimen: In/Out Cath Urine  Result Value Ref Range Status   Specimen Description   Final    IN/OUT CATH URINE Performed at [facility], [address] [place], [place] [address]    Special Requests   Final    NONE Performed at [facility], [address], [place], [place] [address]    Culture   Final    NO GROWTH Performed at [facility], [place] [address], [place], [place] [address]    Report Status 01/22/2022 FINAL  Final         Radiology Studies: CT ABDOMEN PELVIS W CONTRAST  Result Date: 01/21/2022 CLINICAL DATA:  88 year old female with LEFT LOWER abdominal and pelvic pain with fever. EXAM: CT ABDOMEN AND PELVIS WITH CONTRAST TECHNIQUE: Multidetector CT imaging of the abdomen and pelvis was performed using the standard protocol following bolus administration of intravenous contrast. RADIATION DOSE REDUCTION: This exam was performed according to the departmental dose-optimization program which includes automated exposure control, adjustment of the mA and/or kV  according to patient size and/or use of iterative reconstruction technique. CONTRAST:  46m OMNIPAQUE IOHEXOL 300 MG/ML  SOLN COMPARISON:  04/07/2020 CT FINDINGS: Lower chest: Airspace opacities and focal consolidation within the LEFT LOWER lobe likely represents pneumonia. A trace LEFT pleural effusion is noted. Hepatobiliary: The liver is unremarkable. The patient is status post cholecystectomy. CBD is slightly decreased in caliber now measuring 12 mm, previously 15 mm. Pancreas: Unremarkable Spleen: Unremarkable Adrenals/Urinary Tract: Mild bilateral renal atrophy, cortical thinning and bilateral renal cysts are again identified and no follow-up imaging recommended. There is no evidence of hydronephrosis. The adrenal glands and bladder are unremarkable. Stomach/Bowel: Stomach is within normal limits. Appendix not identified. No evidence of bowel wall thickening, distention, or inflammatory changes. Colonic diverticulosis identified without evidence of acute diverticulitis. Vascular/Lymphatic: Irregular calcified and noncalcified atherosclerotic plaque along the LEFT descending thoracic aorta and RIGHT abdominal aorta again noted, slightly increased 2021. The descending thoracic aorta is unchanged in caliber measuring 3.7 cm. There is no evidence of abdominal aortic aneurysm. No abnormal lymph nodes are identified. Reproductive: Status post hysterectomy. No adnexal masses. Other: No ascites, focal collection or pneumoperitoneum. A small LEFT inguinal hernia containing small bowel loops noted, without bowel obstruction. Musculoskeletal: Lumbar spine scoliosis and degenerative changes again identified. Fixation hardware within the proximal LEFT femur again noted. Chronic 30% compression fracture of L5 again identified. IMPRESSION: 1. LEFT LOWER lobe pneumonia with trace LEFT pleural effusion. Imaging follow-up to resolution recommended. 2. Small LEFT inguinal hernia containing small bowel loop, without bowel  obstruction. 3. Unchanged descending thoracic aortic aneurysm measuring 3.7 cm. 4. Decreased CBD dilatation from 2021. 5. Aortic Atherosclerosis (ICD10-I70.0). Electronically Signed   By: JMargarette CanadaM.D.   On: 01/21/2022 14:22   DG Chest Port 1 View  Result Date: 01/21/2022 CLINICAL DATA:  Fever and weakness. EXAM: PORTABLE CHEST 1 VIEW COMPARISON:  04/07/2020 prior studies FINDINGS: LEFT LOWER lung opacities noted suspicious for pneumonia. The cardiomediastinal silhouette is unchanged. Mild interstitial prominence is again noted. There is no evidence of pneumothorax or large pleural effusion. No acute bony abnormalities are identified. IMPRESSION: LEFT LOWER lung opacities suspicious for pneumonia. Radiographic follow-up to resolution is recommended. Electronically Signed   By: JMargarette CanadaM.D.   On: 01/21/2022 12:35        Scheduled Meds:  heparin injection (subcutaneous)  5,000 Units Subcutaneous Q8H   simvastatin  20 mg Oral Daily  Continuous Infusions:  sodium chloride 10 mL/hr at 01/23/22 0314   azithromycin 250 mL/hr at 01/23/22 0314   cefTRIAXone (ROCEPHIN)  IV 2 g (01/23/22 0610)     LOS: 2 days    Time spent: 33 mins     Wyvonnia Dusky, MD [facility] Pager [phone]  If 7PM-7AM, please contact night-coverage [email or web address] 01/23/2022, 7:47 AM

## 2022-01-24 DIAGNOSIS — I482 Chronic atrial fibrillation, unspecified: Secondary | ICD-10-CM | POA: Diagnosis not present

## 2022-01-24 DIAGNOSIS — J189 Pneumonia, unspecified organism: Secondary | ICD-10-CM | POA: Diagnosis not present

## 2022-01-24 DIAGNOSIS — M069 Rheumatoid arthritis, unspecified: Secondary | ICD-10-CM | POA: Diagnosis not present

## 2022-01-24 DIAGNOSIS — I5022 Chronic systolic (congestive) heart failure: Secondary | ICD-10-CM | POA: Diagnosis not present

## 2022-01-24 LAB — LEGIONELLA PNEUMOPHILA SEROGP 1 UR AG: L. pneumophila Serogp 1 Ur Ag: NEGATIVE

## 2022-01-24 LAB — CBC
HCT: 33.6 % — ABNORMAL LOW (ref 36.0–46.0)
Hemoglobin: 10.9 g/dL — ABNORMAL LOW (ref 12.0–15.0)
MCH: 30.7 pg (ref 26.0–34.0)
MCHC: 32.4 g/dL (ref 30.0–36.0)
MCV: 94.6 fL (ref 80.0–100.0)
Platelets: 257 10*3/uL (ref 150–400)
RBC: 3.55 MIL/uL — ABNORMAL LOW (ref 3.87–5.11)
RDW: 12.7 % (ref 11.5–15.5)
WBC: 6 10*3/uL (ref 4.0–10.5)
nRBC: 0 % (ref 0.0–0.2)

## 2022-01-24 LAB — BASIC METABOLIC PANEL
Anion gap: 6 (ref 5–15)
BUN: 20 mg/dL (ref 8–23)
CO2: 24 mmol/L (ref 22–32)
Calcium: 10.2 mg/dL (ref 8.9–10.3)
Chloride: 112 mmol/L — ABNORMAL HIGH (ref 98–111)
Creatinine, Ser: 0.99 mg/dL (ref 0.44–1.00)
GFR, Estimated: 56 mL/min — ABNORMAL LOW (ref >60)
Glucose, Bld: 97 mg/dL (ref 70–99)
Potassium: 4.4 mmol/L (ref 3.5–5.1)
Sodium: 142 mmol/L (ref 135–145)

## 2022-01-24 LAB — BRAIN NATRIURETIC PEPTIDE: B Natriuretic Peptide: 450.3 pg/mL — ABNORMAL HIGH (ref 0.0–100.0)

## 2022-01-24 LAB — SEDIMENTATION RATE: Sed Rate: 79 mm/hr — ABNORMAL HIGH (ref 0–30)

## 2022-01-24 MED ORDER — FUROSEMIDE 40 MG PO TABS
40.0000 mg | ORAL_TABLET | Freq: Every day | ORAL | Status: DC
  Administered 2022-01-24: 40 mg via ORAL
  Filled 2022-01-24: qty 1

## 2022-01-24 MED ORDER — TRAMADOL HCL 50 MG PO TABS
50.0000 mg | ORAL_TABLET | Freq: Four times a day (QID) | ORAL | Status: DC | PRN
  Administered 2022-01-24 – 2022-01-25 (×3): 50 mg via ORAL
  Filled 2022-01-24 (×4): qty 1

## 2022-01-24 MED ORDER — DILTIAZEM HCL ER COATED BEADS 120 MG PO CP24
240.0000 mg | ORAL_CAPSULE | Freq: Every day | ORAL | Status: DC
  Administered 2022-01-24 – 2022-01-25 (×2): 240 mg via ORAL
  Filled 2022-01-24 (×2): qty 2

## 2022-01-24 MED ORDER — FUROSEMIDE 10 MG/ML IJ SOLN
20.0000 mg | Freq: Two times a day (BID) | INTRAMUSCULAR | Status: DC
  Administered 2022-01-24: 20 mg via INTRAVENOUS
  Filled 2022-01-24: qty 4

## 2022-01-24 NOTE — Care Management Important Message (Signed)
Important Message  Patient Details  Name: Roberta Ross MRN: [id number] Date of Birth: 17-Nov-1933   Medicare Important Message Given:  Yes     Dannette Barbara 01/24/2022, 11:24 AM

## 2022-01-24 NOTE — TOC Progression Note (Addendum)
Transition of Care [facility]) - Progression Note    Patient Details  Name: Roberta Ross MRN: [id number] Date of Birth: 26-Jun-1933  Transition of Care [facility]) CM/SW Contact  Beverly Sessions, RN Phone Number: 01/24/2022, 2:57 PM  Clinical Narrative:      Josem Kaufmann approved for [facility] through 9/27.  Auth number [id number] MD notified Per Sharyn Lull at [facility] no bed available today but they can accept patient tomorrow.   Daughter updated by voicemail .  I requested a return call as PT note says patient ambulated 40 feet min guard and is safe to transport by private vehicle       Expected Discharge Plan and Services                                                 Social Determinants of Health (SDOH) Interventions Housing Interventions: Intervention Not Indicated  Readmission Risk Interventions     No data to display

## 2022-01-24 NOTE — TOC Progression Note (Addendum)
Transition of Care [facility]) - Progression Note    Patient Details  Name: Roberta Ross MRN: [id number] Date of Birth: 13-Sep-1933  Transition of Care [facility]) CM/SW Contact  Beverly Sessions, RN Phone Number: 01/24/2022, 9:56 AM  Clinical Narrative:     Bed offers reviewed. Patient request that I call her daughter.  Offers given to daughter, she is to discuss with her brother and call me back with a decision   Update:  Received return call from daughter.  They would like to accept bed at [facility].  Accepted bed in [facility] and notified Sharyn Lull at [facility].  Message sent to MD to determine when it is anticipated patient will be ready for discharge    Expected Discharge Plan and Services                                                 Social Determinants of Health (SDOH) Interventions Housing Interventions: Intervention Not Indicated  Readmission Risk Interventions     No data to display

## 2022-01-24 NOTE — TOC Progression Note (Signed)
Transition of Care [facility]) - Progression Note    Patient Details  Name: Roberta Ross MRN: [id number] Date of Birth: 04-03-1934  Transition of Care [facility]) CM/SW Contact  Beverly Sessions, RN Phone Number: 01/24/2022, 11:25 AM  Clinical Narrative:      MD requested auth to be started Started in navi portal.  Pending ID [id number]      Expected Discharge Plan and Services                                                 Social Determinants of Health (SDOH) Interventions Housing Interventions: Intervention Not Indicated  Readmission Risk Interventions     No data to display

## 2022-01-24 NOTE — TOC Progression Note (Signed)
Transition of Care [facility]) - Progression Note    Patient Details  Name: Roberta Ross MRN: [id number] Date of Birth: [birth year]  Transition of Care [facility]) CM/SW Contact  Beverly Sessions, RN Phone Number: 01/24/2022, 1:27 PM  Clinical Narrative:     Josem Kaufmann still pending        Expected Discharge Plan and Services                                                 Social Determinants of Health (SDOH) Interventions Housing Interventions: Intervention Not Indicated  Readmission Risk Interventions     No data to display

## 2022-01-24 NOTE — Plan of Care (Signed)

## 2022-01-24 NOTE — Progress Notes (Signed)
[facility] Progress Note  Patient: Roberta Ross    RJJ:[id number]  DOA: 01/21/2022    Date of Service: the patient was seen and examined on 01/24/2022  Brief hospital course: 88 year old female with past medical history of systolic CHF with ejection fraction of 30%, rheumatoid arthritis with severe finger deformity, stage IIIb chronic kidney disease and atrial fibrillation not on anticoagulation because of history of subarachnoid hemorrhage admitted on 9/24 for sepsis secondary to pneumonia as well as a urinary tract infection.  Patient treated with IV fluids and antibiotics and improved.  Seen by PT who recommended skilled nursing which patient refused.  Assessment and Plan: Assessment and Plan: * Sepsis ([place]) secondary to community-acquired pneumonia and UTI Met criteria for sepsis on admission with tachycardia and fever.  Continue antibiotics.  Have stopped IV fluids.  Sepsis resolved.  Urine and blood cultures no growth to date  CAP (community acquired pneumonia)    Chronic systolic CHF (congestive heart failure) ([place]) 2D echo on 08/03/2019 showed EF of 30%.  Have restarted home dose of Lasix.  Checking BNP.  Atrial fibrillation, chronic (HCC) Initially Cardizem held due to soft blood pressure.  Slightly tachycardic which also may be making her feel anxious.  Restarted Cardizem.  Patient not on any anticoagulation.  Essential hypertension Blood pressure initially soft.  Now stabilized and resuming home medications.  COPD with chronic bronchitis (HCC) - Bronchodilators  HLD (hyperlipidemia) - Zocor  Chronic kidney disease, stage 3b (HCC) Renal function stable. -Follow-up with BMP  Rheumatoid arthritis ([place]) Patient complains of joint pain.  ESR mildly elevated at 79.  We will try Ultram for now.       Body mass index is 23.48 kg/m.        Consultants: None  Procedures: None  Antimicrobials: IV Rocephin and Zithromax 9/24-present  Code Status:  DNR   Subjective: Complains of hurting all over  Objective: Mild tachycardia Vitals:   01/24/22 0506 01/24/22 0818  BP: 124/83 133/87  Pulse: 83 (!) 109  Resp: 14 18  Temp: 98.2 F (36.8 C) (!) 97.5 F (36.4 C)  SpO2: 94% 96%    Intake/Output Summary (Last 24 hours) at 01/24/2022 1429 Last data filed at 01/24/2022 1040 Gross per 24 hour  Intake 970 ml  Output 990 ml  Net -20 ml   Filed Weights   01/21/22 1110  Weight: 64 kg   Body mass index is 23.48 kg/m.  Exam:  General: Alert and oriented x2, mild distress from pain HEENT: Normocephalic, atraumatic, mucous membranes are moist Cardiovascular: Rhythm, mild tachycardia Respiratory: Clear to auscultation bilaterally  Abdomen: soft, nontender, nondistended, positive bowel sounds no  Musculoskeletal: Clubbing or cyanosis, trace pitting edema.  Noted to have significant erosive changes in both hands Skin: No skin breaks, tears or lesions Psychiatry: Appropriate, no evidence of psychoses.  Mildly anxious Neurology: No focal deficits  Data Reviewed: ESR of 79, rest of labs unremarkable  Disposition:  Status is: Inpatient Remains inpatient appropriate because:  -Following up on BNP -Pain control -Insurance authorization for skilled nursing  Anticipated discharge date: 9/28  Family Communication: Will call daughter DVT Prophylaxis: heparin injection 5,000 Units Start: 01/22/22 1400 Place and maintain sequential compression device Start: 01/21/22 1515    Author: Annita Brod ,MD 01/24/2022 2:29 PM  To reach On-call, see care teams to locate the attending and reach out via [email or web address]. Between 7PM-7AM, please contact night-coverage If you still have difficulty reaching the attending provider, please page the [facility] (Director on Call)  for [facility] on amion for assistance.

## 2022-01-24 NOTE — Hospital Course (Signed)
88 year old female with past medical history of systolic CHF with ejection fraction of 30%, rheumatoid arthritis with severe finger deformity, stage IIIb chronic kidney disease and atrial fibrillation not on anticoagulation because of history of subarachnoid hemorrhage admitted on 9/24 for sepsis secondary to pneumonia as well as a urinary tract infection.  Patient treated with IV fluids and antibiotics and improved.  Seen by PT who recommended skilled nursing which patient refused.

## 2022-01-24 NOTE — Assessment & Plan Note (Signed)
Patient complains of joint pain.  ESR mildly elevated at 79.  We will try Ultram for now.

## 2022-01-24 NOTE — Progress Notes (Signed)
Physical Therapy Treatment Patient Details Name: Roberta Ross MRN: [id number] DOB: Aug 18, 1933 Today's Date: 01/24/2022   History of Present Illness Pt is an 88 yo female that presented to ED for SOB, L sided chest pain, mild cough. PMH of CHF with EF 30%, HTN, anxiety,  HLD, RA with severe finger deformety, CKD stage IIIb, atrial fibrillation not on anticoagulants and history of subarachnoid hemorrhage.    PT Comments    Patient alert, agreeable to PT with some education, initially insistent that something had changed and she could not get up due to her strength.With encouragement pt performed bed mobility minA (pt pulls on bed rail at home) and several sit <> stands during session, minA. Trialed RW with some improvement in activity tolerance and balance noted, pt agreeable to use (reaches for BUE support with use of SPC). Pt also performed pericare in standing, donning/doffing with minA. She ambulated ~67f, CGA. The patient would benefit from further skilled PT intervention to continue to progress towards goals. Recommendation remains appropriate.       Recommendations for follow up therapy are one component of a multi-disciplinary discharge planning process, led by the attending physician.  Recommendations may be updated based on patient status, additional functional criteria and insurance authorization.  Follow Up Recommendations  Skilled nursing-short term rehab (<3 hours/day) Can patient physically be transported by private vehicle: Yes   Assistance Recommended at Discharge Frequent or constant Supervision/Assistance  Patient can return home with the following Assistance with cooking/housework;Assist for transportation;Help with stairs or ramp for entrance;A lot of help with walking and/or transfers;A lot of help with bathing/dressing/bathroom   Equipment Recommendations  Other (comment) (TBD)    Recommendations for Other Services       Precautions / Restrictions  Precautions Precautions: Fall Restrictions Weight Bearing Restrictions: No     Mobility  Bed Mobility Overal bed mobility: Needs Assistance Bed Mobility: Supine to Sit     Supine to sit: Min assist     General bed mobility comments: provided with handheld assist since pt pulls on bed rail at home    Transfers Overall transfer level: Needs assistance Equipment used: Straight cane, Rolling walker (2 wheels) Transfers: Sit to/from Stand Sit to Stand: Min assist           General transfer comment: with SPC minA due to posterior lean, some improvement in steadiness noted with use of RW    Ambulation/Gait Ambulation/Gait assistance: Min guard Gait Distance (Feet): 60 Feet Assistive device: Straight cane, Rolling walker (2 wheels) Gait Pattern/deviations: Shuffle, Knee flexed in stance - left, Knee flexed in stance - right, Decreased stride length, Decreased step length - right, Decreased step length - left       General Gait Details: improvement in step length and minimal improvement in gait velocity with use of RW; pt reached for bilateral support with SPC only   Stairs             Wheelchair Mobility    Modified Rankin (Stroke Patients Only)       Balance Overall balance assessment: Needs assistance Sitting-balance support: Feet supported Sitting balance-Leahy Scale: Good     Standing balance support: Single extremity supported, During functional activity, Bilateral upper extremity supported Standing balance-Leahy Scale: Fair Standing balance comment: fair balance with bilateral UE support                            Cognition Arousal/Alertness: Awake/alert Behavior During Therapy: WFL for  tasks assessed/performed, Anxious Overall Cognitive Status: Within Functional Limits for tasks assessed                                 General Comments: followed all commands, somewhat anxious and self limiting        Exercises  Other Exercises Other Exercises: BSC with CGA-minA, steadying assist in standing and with donning/doffing underwear    General Comments        Pertinent Vitals/Pain Pain Assessment Pain Assessment: Faces Faces Pain Scale: No hurt    Home Living                          Prior Function            PT Goals (current goals can now be found in the care plan section) Progress towards PT goals: Progressing toward goals    Frequency    Min 2X/week      PT Plan Current plan remains appropriate    Co-evaluation              AM-PAC PT "6 Clicks" Mobility   Outcome Measure  Help needed turning from your back to your side while in a flat bed without using bedrails?: A Lot Help needed moving from lying on your back to sitting on the side of a flat bed without using bedrails?: A Lot Help needed moving to and from a bed to a chair (including a wheelchair)?: A Little Help needed standing up from a chair using your arms (e.g., wheelchair or bedside chair)?: A Little Help needed to walk in hospital room?: A Little Help needed climbing 3-5 steps with a railing? : A Little 6 Click Score: 16    End of Session Equipment Utilized During Treatment: Gait belt Activity Tolerance: Patient tolerated treatment well Patient left: in chair;with chair alarm set;with call bell/phone within reach Nurse Communication: Mobility status PT Visit Diagnosis: Other abnormalities of gait and mobility (R26.89);Difficulty in walking, not elsewhere classified (R26.2);Muscle weakness (generalized) (M62.81)     Time: 1735-6701 PT Time Calculation (min) (ACUTE ONLY): 33 min  Charges:  $Therapeutic Activity: 23-37 mins                     Lieutenant Diego PT, DPT 3:43 PM,01/24/22

## 2022-01-25 DIAGNOSIS — I5023 Acute on chronic systolic (congestive) heart failure: Secondary | ICD-10-CM | POA: Diagnosis not present

## 2022-01-25 DIAGNOSIS — N1832 Chronic kidney disease, stage 3b: Secondary | ICD-10-CM | POA: Diagnosis not present

## 2022-01-25 DIAGNOSIS — N390 Urinary tract infection, site not specified: Secondary | ICD-10-CM | POA: Diagnosis not present

## 2022-01-25 DIAGNOSIS — I482 Chronic atrial fibrillation, unspecified: Secondary | ICD-10-CM | POA: Diagnosis not present

## 2022-01-25 LAB — BASIC METABOLIC PANEL
Anion gap: 7 (ref 5–15)
BUN: 23 mg/dL (ref 8–23)
CO2: 26 mmol/L (ref 22–32)
Calcium: 10.2 mg/dL (ref 8.9–10.3)
Chloride: 106 mmol/L (ref 98–111)
Creatinine, Ser: 1.22 mg/dL — ABNORMAL HIGH (ref 0.44–1.00)
GFR, Estimated: 43 mL/min — ABNORMAL LOW (ref >60)
Glucose, Bld: 107 mg/dL — ABNORMAL HIGH (ref 70–99)
Potassium: 3.9 mmol/L (ref 3.5–5.1)
Sodium: 139 mmol/L (ref 135–145)

## 2022-01-25 MED ORDER — TRAMADOL HCL 50 MG PO TABS: 50.0000 mg | ORAL_TABLET | Freq: Four times a day (QID) | ORAL | 0 refills | Status: DC | PRN

## 2022-01-25 MED ORDER — SIMVASTATIN 10 MG PO TABS: 10.0000 mg | ORAL_TABLET | Freq: Every evening | ORAL | 11 refills | Status: AC

## 2022-01-25 NOTE — Progress Notes (Signed)
Report called to [facility]. Pt taken out by staff in wheelchair. Packet sent with daughter to facility.

## 2022-01-25 NOTE — TOC Transition Note (Signed)
Transition of Care [facility]) - CM/SW Discharge Note   Patient Details  Name: Roberta Ross MRN: [id number] Date of Birth: 10-Jun-1933  [facility] of Care [facility]) CM/SW Contact:  Beverly Sessions, RN Phone Number: 01/25/2022, 2:30 PM   Clinical Narrative:      Patient will DC [id number] Anticipated DC date: 01/25/22  Family notified:daughter Transport [id number] at [employer]  Per MD patient ready for DC to . RN, patient, patient's family, and facility notified of DC. Discharge Summary sent to facility. RN given number for report. TOC signing off.  Isaias Cowman [facility] [phone]        Patient Goals and CMS Choice        Discharge Placement                       Discharge Plan and Services                                     Social Determinants of Health (SDOH) Interventions Housing Interventions: Intervention Not Indicated   Readmission Risk Interventions     No data to display

## 2022-01-25 NOTE — TOC Progression Note (Signed)
Transition of Care [facility]) - Progression Note    Patient Details  Name: Roberta Ross MRN: [id number] Date of Birth: 26-Oct-1933  [facility] of Care [facility]) CM/SW Contact  Beverly Sessions, RN Phone Number: 01/25/2022, 11:29 AM  Clinical Narrative:     [facility]ull [facility] patient after 3pm.  Daughter would like to know if patient is going to discharge today. Message sent to MD to follow up       Expected Discharge Plan and Services                                                 Social Determinants of Health (SDOH) Interventions Housing Interventions: Intervention Not Indicated  Readmission Risk Interventions     No data to display

## 2022-01-25 NOTE — Discharge Summary (Signed)
Physician Discharge Summary   Patient: Roberta Ross MRN: [id number] DOB: 1933-05-08  Admit date:     01/21/2022  Discharge date: 01/25/22  Discharge Physician: Annita Brod   PCP: Kirk Ruths, MD   Recommendations at discharge:   Medication change: Zocor decreased to 10 mg to minimize chance for toxicity given interaction with Cardizem. Patient being discharged to skilled nursing New to medication: Ultram 50 mg every 6 hours as needed for pain  Discharge Diagnoses: Principal Problem:   Sepsis ([place]) secondary to community-acquired pneumonia and UTI Active Problems:   UTI (urinary tract infection)   CAP (community acquired pneumonia)   Acute on chronic systolic CHF (congestive heart failure) (HCC)   Atrial fibrillation, chronic ([place])   Essential hypertension   COPD with chronic bronchitis (HCC)   HLD (hyperlipidemia)   Chronic kidney disease, stage 3b ([place])   Rheumatoid arthritis ([place])  Resolved Problems:   * No resolved hospital problems. *  Hospital Course: 88 year old female with past medical history of systolic CHF with ejection fraction of 30%, rheumatoid arthritis with severe finger deformity, stage IIIb chronic kidney disease and atrial fibrillation not on anticoagulation because of history of subarachnoid hemorrhage admitted on 9/24 for sepsis secondary to pneumonia as well as a urinary tract infection.  Patient treated with IV fluids and antibiotics and improved.  Seen by PT who recommended skilled nursing which patient initially refused, but then consented.  Assessment and Plan: * Sepsis ([place]) secondary to community-acquired pneumonia and UTI Met criteria for sepsis on admission with tachycardia and fever.  Continue antibiotics.  Have stopped IV fluids.  Sepsis resolved.  Urine and blood cultures no growth to date.  By day of discharge, had completed 5 days of antibiotics and no additional antibiotics needed.  CAP (community acquired  pneumonia)    Acute on chronic systolic CHF (congestive heart failure) ([place]) 2D echo on 08/03/2019 showed EF of 30%.  Have restarted home dose of Lasix.  BNP mildly elevated on 9/27 at 450.  Patient given 2 doses of IV Lasix and had diuresed several liters by the following day.  Atrial fibrillation, chronic (HCC) Initially Cardizem held due to soft blood pressure.  Slightly tachycardic which also may be making her feel anxious.  Restarted Cardizem.  Patient not on any anticoagulation.  Essential hypertension Blood pressure initially soft.  Now stabilized and resuming home medications.  COPD with chronic bronchitis (HCC) - Bronchodilators  HLD (hyperlipidemia) - Zocor  Chronic kidney disease, stage 3b (HCC) Renal function stable. -Follow-up with BMP  Rheumatoid arthritis ([place]) Patient complains of joint pain.  ESR mildly elevated at 79.  We will try Ultram for now.        Pain control - [employer] Controlled Substance Reporting System database was reviewed. and patient was instructed, not to drive, operate heavy machinery, perform activities at heights, swimming or participation in water activities or provide baby-sitting services while on Pain, Sleep and Anxiety Medications; until their outpatient Physician has advised to do so again. Also recommended to not to take more than prescribed Pain, Sleep and Anxiety Medications.  Consultants: None Procedures performed: None Disposition: Nursing Diet recommendation:  Discharge Diet Orders (From admission, onward)     Start     Ordered   01/25/22 0000  Diet - low sodium heart healthy        01/25/22 1256           Heart healthy DISCHARGE MEDICATION: Allergies as of 01/25/2022  Reactions   Metoprolol Shortness Of Breath   Penicillins Other (See Comments)   Internal swelling including throat TOLERATES CEFTRIAXONE   Methotrexate Derivatives Nausea Only   Doxycycline Hyclate Rash        Medication List      TAKE these medications    acetaminophen 650 MG CR tablet Commonly known as: TYLENOL Take 650 mg by mouth 2 (two) times daily.   diltiazem 240 MG 24 hr capsule Commonly known as: CARDIZEM CD Take 240 mg by mouth daily.   furosemide 40 MG tablet Commonly known as: LASIX Take 40 mg by mouth daily.   sennosides-docusate sodium 8.6-50 MG tablet Commonly known as: SENOKOT-S Take 2 tablets by mouth 2 (two) times daily.   simvastatin 10 MG tablet Commonly known as: Zocor Take 1 tablet (10 mg total) by mouth every evening. What changed:  medication strength how much to take when to take this   traMADol 50 MG tablet Commonly known as: ULTRAM Take 1 tablet (50 mg total) by mouth every 6 (six) hours as needed for moderate pain.        Contact information for after-discharge care     Destination     [facility] .   Service: Skilled Nursing Contact information: [address] [place] [phone]                    Discharge Exam: Danley Danker Weights   01/21/22 1110  Weight: 64 kg   General: Alert and oriented x2, slightly anxious Cardiovascular: Irregular rhythm, rate controlled Lungs: Clear to auscultation bilaterally  Condition at discharge: good  The results of significant diagnostics from this hospitalization (including imaging, microbiology, ancillary and laboratory) are listed below for reference.   Imaging Studies: CT ABDOMEN PELVIS W CONTRAST  Result Date: 01/21/2022 CLINICAL DATA:  88 year old female with LEFT LOWER abdominal and pelvic pain with fever. EXAM: CT ABDOMEN AND PELVIS WITH CONTRAST TECHNIQUE: Multidetector CT imaging of the abdomen and pelvis was performed using the standard protocol following bolus administration of intravenous contrast. RADIATION DOSE REDUCTION: This exam was performed according to the departmental dose-optimization program which includes automated exposure control,  adjustment of the mA and/or kV according to patient size and/or use of iterative reconstruction technique. CONTRAST:  77m OMNIPAQUE IOHEXOL 300 MG/ML  SOLN COMPARISON:  04/07/2020 CT FINDINGS: Lower chest: Airspace opacities and focal consolidation within the LEFT LOWER lobe likely represents pneumonia. A trace LEFT pleural effusion is noted. Hepatobiliary: The liver is unremarkable. The patient is status post cholecystectomy. CBD is slightly decreased in caliber now measuring 12 mm, previously 15 mm. Pancreas: Unremarkable Spleen: Unremarkable Adrenals/Urinary Tract: Mild bilateral renal atrophy, cortical thinning and bilateral renal cysts are again identified and no follow-up imaging recommended. There is no evidence of hydronephrosis. The adrenal glands and bladder are unremarkable. Stomach/Bowel: Stomach is within normal limits. Appendix not identified. No evidence of bowel wall thickening, distention, or inflammatory changes. Colonic diverticulosis identified without evidence of acute diverticulitis. Vascular/Lymphatic: Irregular calcified and noncalcified atherosclerotic plaque along the LEFT descending thoracic aorta and RIGHT abdominal aorta again noted, slightly increased 2021. The descending thoracic aorta is unchanged in caliber measuring 3.7 cm. There is no evidence of abdominal aortic aneurysm. No abnormal lymph nodes are identified. Reproductive: Status post hysterectomy. No adnexal masses. Other: No ascites, focal collection or pneumoperitoneum. A small LEFT inguinal hernia containing small bowel loops noted, without bowel obstruction. Musculoskeletal: Lumbar spine scoliosis and degenerative changes again identified. Fixation  hardware within the proximal LEFT femur again noted. Chronic 30% compression fracture of L5 again identified. IMPRESSION: 1. LEFT LOWER lobe pneumonia with trace LEFT pleural effusion. Imaging follow-up to resolution recommended. 2. Small LEFT inguinal hernia containing small  bowel loop, without bowel obstruction. 3. Unchanged descending thoracic aortic aneurysm measuring 3.7 cm. 4. Decreased CBD dilatation from 2021. 5. Aortic Atherosclerosis (ICD10-I70.0). Electronically Signed   By: Margarette Canada M.D.   On: 01/21/2022 14:22   DG Chest Port 1 View  Result Date: 01/21/2022 CLINICAL DATA:  Fever and weakness. EXAM: PORTABLE CHEST 1 VIEW COMPARISON:  04/07/2020 prior studies FINDINGS: LEFT LOWER lung opacities noted suspicious for pneumonia. The cardiomediastinal silhouette is unchanged. Mild interstitial prominence is again noted. There is no evidence of pneumothorax or large pleural effusion. No acute bony abnormalities are identified. IMPRESSION: LEFT LOWER lung opacities suspicious for pneumonia. Radiographic follow-up to resolution is recommended. Electronically Signed   By: Margarette Canada M.D.   On: 01/21/2022 12:35    Microbiology: Results for orders placed or performed during the hospital encounter of 01/21/22  Resp Panel by RT-PCR (Flu A&B, Covid) Anterior Nasal Swab     Status: None   Collection Time: 01/21/22 11:51 AM   Specimen: Anterior Nasal Swab  Result Value Ref Range Status   SARS Coronavirus 2 by RT PCR NEGATIVE NEGATIVE Final    Comment: (NOTE) SARS-CoV-2 target nucleic acids are NOT DETECTED.  The SARS-CoV-2 RNA is generally detectable in upper respiratory specimens during the acute phase of infection. The lowest concentration of SARS-CoV-2 viral copies this assay can detect is 138 copies/mL. A negative result does not preclude SARS-Cov-2 infection and should not be used as the sole basis for treatment or other patient management decisions. A negative result may occur with  improper specimen collection/handling, submission of specimen other than nasopharyngeal swab, presence of viral mutation(s) within the areas targeted by this assay, and inadequate number of viral copies(<138 copies/mL). A negative result must be combined with clinical  observations, patient history, and epidemiological information. The expected result is Negative.  Fact Sheet for Patients:  EntrepreneurPulse.com.au  Fact Sheet for Healthcare Providers:  IncredibleEmployment.be  This test is no t yet approved or cleared by the [place] FDA and  has been authorized for detection and/or diagnosis of SARS-CoV-2 by FDA under an Emergency Use Authorization (EUA). This EUA will remain  in effect (meaning this test can be used) for the duration of the COVID-19 declaration under Section 564(b)(1) of the Act, 21 U.S.C.section 360bbb-3(b)(1), unless the authorization is terminated  or revoked sooner.       Influenza A by PCR NEGATIVE NEGATIVE Final   Influenza B by PCR NEGATIVE NEGATIVE Final    Comment: (NOTE) The Xpert Xpress SARS-CoV-2/FLU/RSV plus assay is intended as an aid in the diagnosis of influenza from Nasopharyngeal swab specimens and should not be used as a sole basis for treatment. Nasal washings and aspirates are unacceptable for Xpert Xpress SARS-CoV-2/FLU/RSV testing.  Fact Sheet for Patients: EntrepreneurPulse.com.au  Fact Sheet for Healthcare Providers: IncredibleEmployment.be  This test is not yet approved or cleared by the [place] FDA and has been authorized for detection and/or diagnosis of SARS-CoV-2 by FDA under an Emergency Use Authorization (EUA). This EUA will remain in effect (meaning this test can be used) for the duration of the COVID-19 declaration under Section 564(b)(1) of the Act, 21 U.S.C. section 360bbb-3(b)(1), unless the authorization is terminated or revoked.  Performed at [facility], [address] [place], [place] [address]   Blood Culture (routine x 2)     Status: None (Preliminary result)   Collection Time: 01/21/22 11:51 AM   Specimen: BLOOD  Result Value Ref Range Status   Specimen Description BLOOD LEFT  ANTECUBITAL  Final   Special Requests   Final    BOTTLES DRAWN AEROBIC AND ANAEROBIC Blood Culture adequate volume   Culture   Final    NO GROWTH 4 DAYS Performed at [facility], [address], [place], [place] [address]    Report Status PENDING  Incomplete  Blood Culture (routine x 2)     Status: None (Preliminary result)   Collection Time: 01/21/22 11:51 AM   Specimen: BLOOD  Result Value Ref Range Status   Specimen Description BLOOD BLOOD LEFT FOREARM  Final   Special Requests   Final    BOTTLES DRAWN AEROBIC AND ANAEROBIC Blood Culture adequate volume   Culture   Final    NO GROWTH 4 DAYS Performed at [facility], [address], [place], [place] [address]    Report Status PENDING  Incomplete  Urine Culture     Status: None   Collection Time: 01/21/22 12:50 PM   Specimen: In/Out Cath Urine  Result Value Ref Range Status   Specimen Description   Final    IN/OUT CATH URINE Performed at [facility], [address] [place], [place] [address]    Special Requests   Final    NONE Performed at [facility], [address], [place], [place] [address]    Culture   Final    NO GROWTH Performed at [facility], [place] [address] [place], [place] [address]    Report Status 01/22/2022 FINAL  Final    Labs: CBC: Recent Labs  Lab 01/21/22 1113 01/22/22 0436 01/23/22 0519 01/24/22 0623  WBC 11.9* 9.0 7.1 6.0  HGB 13.1 10.7* 10.5* 10.9*  HCT 39.1 31.8* 31.9* 33.6*  MCV 92.4 91.6 93.5 94.6  PLT 278 230 234 277   Basic Metabolic Panel: Recent Labs  Lab 01/21/22 1113 01/22/22 0436 01/23/22 0519 01/24/22 0623 01/25/22 0557  NA 139 139 140 142 139  K 3.5 3.1* 4.2 4.4 3.9  CL 102 107 112* 112* 106  CO2 _0 GLUCOSE 161* 122* 105* 97 107*  BUN 26* 24* _1 CREATININE 1.33* 1.14* 1.03* 0.99 1.22*  CALCIUM 10.3 9.7 9.7 10.2 10.2   Liver Function Tests: Recent Labs  Lab 01/21/22 1135  AST 30  ALT 22   ALKPHOS 111  BILITOT 1.3*  PROT 7.9  ALBUMIN 3.3*   CBG: Recent Labs  Lab 01/22/22 1711  GLUCAP 110*    Discharge time spent: less than 30 minutes.  Signed: Annita Brod, MD [facility] 01/25/2022

## 2022-01-25 NOTE — Progress Notes (Signed)
Physical Therapy Treatment Patient Details Name: Roberta Ross MRN: [id number] DOB: 10/04/1933 Today's Date: 01/25/2022   History of Present Illness Pt is an 88 yo female that presented to ED for SOB, L sided chest pain, mild cough. PMH of CHF with EF 30%, HTN, anxiety,  HLD, RA with severe finger deformety, CKD stage IIIb, atrial fibrillation not on anticoagulants and history of subarachnoid hemorrhage.    PT Comments    Patient agreeable to OOB activities, eager for gait progression and requesting to wash face/clean up while OOB.  Completes all functional activities with cga/min assist this date; but does continue to demonstrate dynamic balance deficits.  Optimal safety/indep with use of RW; patient in agreement. Mild STM deficits appreciated, requiring consistent cuing for redirection to and sequencing of simple tasks.     Recommendations for follow up therapy are one component of a multi-disciplinary discharge planning process, led by the attending physician.  Recommendations may be updated based on patient status, additional functional criteria and insurance authorization.  Follow Up Recommendations  Skilled nursing-short term rehab (<3 hours/day) Can patient physically be transported by private vehicle: Yes   Assistance Recommended at Discharge Frequent or constant Supervision/Assistance  Patient can return home with the following Assistance with cooking/housework;Assist for transportation;Help with stairs or ramp for entrance;A lot of help with walking and/or transfers;A lot of help with bathing/dressing/bathroom   Equipment Recommendations       Recommendations for Other Services       Precautions / Restrictions Precautions Precautions: Fall Precaution Comments: severe RA bilat hands Restrictions Weight Bearing Restrictions: No     Mobility  Bed Mobility Overal bed mobility: Needs Assistance Bed Mobility: Supine to Sit     Supine to sit: Supervision     General  bed mobility comments: increased time/effort, but completes without physical assist this date    Transfers Overall transfer level: Needs assistance Equipment used: Rolling walker (2 wheels) Transfers: Sit to/from Stand Sit to Stand: Min guard           General transfer comment: prefers to place bilat hands on RW prior to lift off (due to difficulty with RA); increased time/effort for anterior weight translation with movement transition    Ambulation/Gait Ambulation/Gait assistance: Min guard Gait Distance (Feet): 30 Feet Assistive device: Rolling walker (2 wheels)         General Gait Details: fair step height/length; mild forward trunk flexion.  Decreased cadence. Limited balance reactions evident, continues to benefit from use of RW   Stairs             Wheelchair Mobility    Modified Rankin (Stroke Patients Only)       Balance Overall balance assessment: Needs assistance Sitting-balance support: No upper extremity supported, Feet supported Sitting balance-Leahy Scale: Good     Standing balance support: Bilateral upper extremity supported Standing balance-Leahy Scale: Fair                              Cognition Arousal/Alertness: Awake/alert Behavior During Therapy: WFL for tasks assessed/performed Overall Cognitive Status: Within Functional Limits for tasks assessed                                 General Comments: mild STM deficits, requiring frequent reminders and reorientation throughout session        Exercises Other Exercises Other Exercises: Toilet transfer, SPT with  RW, cga/min assist; consistent cuing for direction and task sequencing.  Sit/stand from [facility] with RW, cga/min assist; standing balance for peri-care and clothing management, cga/min assist.  limited ability to move outside immediate BOS for functional activities. Other Exercises: Oral care and light grooming tasks in supported sititng position (in  recliner), set up/supervision.  Difficulty with fine motor control and grasp/release of objects due to severe bilat hand RA    General Comments        Pertinent Vitals/Pain Pain Assessment Pain Assessment: Faces Faces Pain Scale: Hurts little more Pain Location: L chest/rib pain Pain Descriptors / Indicators: Grimacing, Moaning, Guarding, Sore Pain Intervention(s): Limited activity within patient's tolerance, Monitored during session, Repositioned    Home Living                          Prior Function            PT Goals (current goals can now be found in the care plan section) Acute Rehab PT Goals Patient Stated Goal: to go home PT Goal Formulation: With patient Time For Goal Achievement: 02/05/22 Potential to Achieve Goals: Good Progress towards PT goals: Progressing toward goals    Frequency    Min 2X/week      PT Plan Current plan remains appropriate    Co-evaluation              AM-PAC PT "6 Clicks" Mobility   Outcome Measure  Help needed turning from your back to your side while in a flat bed without using bedrails?: None Help needed moving from lying on your back to sitting on the side of a flat bed without using bedrails?: None Help needed moving to and from a bed to a chair (including a wheelchair)?: A Little Help needed standing up from a chair using your arms (e.g., wheelchair or bedside chair)?: A Little   Help needed climbing 3-5 steps with a railing? : A Little 6 Click Score: 17    End of Session Equipment Utilized During Treatment: Gait belt Activity Tolerance: Patient tolerated treatment well Patient left: in chair;with chair alarm set;with call bell/phone within reach Nurse Communication: Mobility status PT Visit Diagnosis: Other abnormalities of gait and mobility (R26.89);Difficulty in walking, not elsewhere classified (R26.2);Muscle weakness (generalized) (M62.81)     Time: 5462-7035 PT Time Calculation (min) (ACUTE  ONLY): 35 min  Charges:  $Therapeutic Activity: 8-22 mins                    Senetra Dillin H. Owens Shark, PT, DPT, NCS 01/25/22, 11:16 AM [phone]

## 2022-01-25 NOTE — Progress Notes (Addendum)
Mobility Specialist - Progress Note   01/25/22 1400  Mobility  Activity Refused mobility     Pain limiting ability to participate. Pt reporting pain in L side that increases with movement. Will attempt session another date/time as appropriate. Pt expected to d/c later this date.    Kathee Delton Mobility Specialist 01/25/22, 2:49 PM

## 2022-01-26 LAB — CULTURE, BLOOD (ROUTINE X 2)
Culture: NO GROWTH
Culture: NO GROWTH
Special Requests: ADEQUATE
Special Requests: ADEQUATE

## 2022-05-03 ENCOUNTER — Encounter: Payer: Self-pay | Admitting: Emergency Medicine

## 2022-05-03 ENCOUNTER — Other Ambulatory Visit: Payer: Self-pay

## 2022-05-03 ENCOUNTER — Emergency Department: Payer: Medicare Other

## 2022-05-03 ENCOUNTER — Emergency Department
Admission: EM | Admit: 2022-05-03 | Discharge: 2022-05-03 | Disposition: A | Discharge status: Home / Self Care | Payer: Medicare Other | Attending: Emergency Medicine | Admitting: Emergency Medicine

## 2022-05-03 DIAGNOSIS — J029 Acute pharyngitis, unspecified: Secondary | ICD-10-CM | POA: Diagnosis present

## 2022-05-03 DIAGNOSIS — N1832 Chronic kidney disease, stage 3b: Secondary | ICD-10-CM | POA: Diagnosis not present

## 2022-05-03 DIAGNOSIS — B349 Viral infection, unspecified: Secondary | ICD-10-CM | POA: Diagnosis not present

## 2022-05-03 DIAGNOSIS — J449 Chronic obstructive pulmonary disease, unspecified: Secondary | ICD-10-CM | POA: Diagnosis not present

## 2022-05-03 DIAGNOSIS — R051 Acute cough: Secondary | ICD-10-CM | POA: Diagnosis not present

## 2022-05-03 DIAGNOSIS — Z1152 Encounter for screening for COVID-19: Secondary | ICD-10-CM | POA: Diagnosis not present

## 2022-05-03 DIAGNOSIS — I129 Hypertensive chronic kidney disease with stage 1 through stage 4 chronic kidney disease, or unspecified chronic kidney disease: Secondary | ICD-10-CM | POA: Diagnosis not present

## 2022-05-03 LAB — COMPREHENSIVE METABOLIC PANEL
ALT: 10 U/L (ref 0–44)
AST: 16 U/L (ref 15–41)
Albumin: 3.6 g/dL (ref 3.5–5.0)
Alkaline Phosphatase: 85 U/L (ref 38–126)
Anion gap: 10 (ref 5–15)
BUN: 23 mg/dL (ref 8–23)
CO2: 23 mmol/L (ref 22–32)
Calcium: 9.9 mg/dL (ref 8.9–10.3)
Chloride: 105 mmol/L (ref 98–111)
Creatinine, Ser: 1.28 mg/dL — ABNORMAL HIGH (ref 0.44–1.00)
GFR, Estimated: 41 mL/min — ABNORMAL LOW (ref >60)
Glucose, Bld: 100 mg/dL — ABNORMAL HIGH (ref 70–99)
Potassium: 3.8 mmol/L (ref 3.5–5.1)
Sodium: 138 mmol/L (ref 135–145)
Total Bilirubin: 0.9 mg/dL (ref 0.3–1.2)
Total Protein: 7.1 g/dL (ref 6.5–8.1)

## 2022-05-03 LAB — CBC WITH DIFFERENTIAL/PLATELET
Abs Immature Granulocytes: 0.03 10*3/uL (ref 0.00–0.07)
Basophils Absolute: 0 10*3/uL (ref 0.0–0.1)
Basophils Relative: 0 %
Eosinophils Absolute: 0.2 10*3/uL (ref 0.0–0.5)
Eosinophils Relative: 2 %
HCT: 40.5 % (ref 36.0–46.0)
Hemoglobin: 13 g/dL (ref 12.0–15.0)
Immature Granulocytes: 0 %
Lymphocytes Relative: 12 %
Lymphs Abs: 1.1 10*3/uL (ref 0.7–4.0)
MCH: 30.2 pg (ref 26.0–34.0)
MCHC: 32.1 g/dL (ref 30.0–36.0)
MCV: 94 fL (ref 80.0–100.0)
Monocytes Absolute: 0.8 10*3/uL (ref 0.1–1.0)
Monocytes Relative: 9 %
Neutro Abs: 7 10*3/uL (ref 1.7–7.7)
Neutrophils Relative %: 77 %
Platelets: 175 10*3/uL (ref 150–400)
RBC: 4.31 MIL/uL (ref 3.87–5.11)
RDW: 13.3 % (ref 11.5–15.5)
WBC: 9.1 10*3/uL (ref 4.0–10.5)
nRBC: 0 % (ref 0.0–0.2)

## 2022-05-03 LAB — RESP PANEL BY RT-PCR (RSV, FLU A&B, COVID)  RVPGX2
Influenza A by PCR: NEGATIVE
Influenza B by PCR: NEGATIVE
Resp Syncytial Virus by PCR: NEGATIVE
SARS Coronavirus 2 by RT PCR: NEGATIVE

## 2022-05-03 LAB — GROUP A STREP BY PCR: Group A Strep by PCR: NOT DETECTED

## 2022-05-03 LAB — TROPONIN I (HIGH SENSITIVITY): Troponin I (High Sensitivity): 11 ng/L (ref <18)

## 2022-05-03 MED ORDER — AZITHROMYCIN 500 MG PO TABS
500.0000 mg | ORAL_TABLET | Freq: Once | ORAL | Status: AC
  Administered 2022-05-03: 500 mg via ORAL
  Filled 2022-05-03: qty 1

## 2022-05-03 MED ORDER — ALBUTEROL SULFATE HFA 108 (90 BASE) MCG/ACT IN AERS: 2.0000 | INHALATION_SPRAY | RESPIRATORY_TRACT | 0 refills | Status: AC | PRN

## 2022-05-03 MED ORDER — HYDROCOD POLI-CHLORPHE POLI ER 10-8 MG/5ML PO SUER: 5.0000 mL | Freq: Two times a day (BID) | ORAL | 0 refills | Status: DC | PRN

## 2022-05-03 MED ORDER — AZITHROMYCIN 250 MG PO TABS: 250.0000 mg | ORAL_TABLET | Freq: Every day | ORAL | 0 refills | Status: DC

## 2022-05-03 MED ORDER — HYDROCOD POLI-CHLORPHE POLI ER 10-8 MG/5ML PO SUER
5.0000 mL | Freq: Once | ORAL | Status: AC
  Administered 2022-05-03: 5 mL via ORAL
  Filled 2022-05-03: qty 5

## 2022-05-03 MED ORDER — MAGIC MOUTHWASH
10.0000 mL | Freq: Once | ORAL | Status: AC
  Administered 2022-05-03: 10 mL via ORAL
  Filled 2022-05-03: qty 10

## 2022-05-03 NOTE — ED Notes (Signed)
Patient discharged to home per MD order. Patient in stable condition, and deemed medically cleared by ED provider for discharge. Discharge instructions reviewed with patient/family using "Teach Back"; verbalized understanding of medication education and administration, and information about follow-up care. Denies further concerns. ° °

## 2022-05-03 NOTE — Discharge Instructions (Addendum)
1.  Finish antibiotic as prescribed.  Take your next dose Friday morning. 2.  You may take cough medicine as needed. 3.  You may use albuterol inhaler 2 puffs every 4 hours as needed for cough/difficulty breathing. 4.  Wear TED hose while awake. 5.  Return to the ER for worsening symptoms, persistent vomiting, difficulty breathing or other concerns.

## 2022-05-03 NOTE — ED Provider Notes (Signed)
[facility] Provider Note    Event Date/Time   First MD Initiated Contact with Patient 05/03/22 [phone]     (approximate)   History   Cough, Chills, Generalized Body Aches, and Sore Throat   HPI  Roberta Ross is a 89 y.o. female who presents to the ED from [facility] assisted living with a 5-day history of productive cough of yellow sputum, body aches, sore throat, chills and diarrhea.  Denies headache, neck pain, chest pain, shortness of breath, abdominal pain, nausea, vomiting or dizziness.     Past Medical History   Past Medical History:  Diagnosis Date   Anemia    past hx   Arthritis    hands and feet   Chronic kidney disease    COPD (chronic obstructive pulmonary disease) (HCC)    Dyspnea    Dysrhythmia    Pt reports slow HR and papatations   Heart murmur    past hx   Hypercholesteremia    Hypertension    Rheumatic fever    (x2) in past   Wears dentures    full upper     Active Problem List   Patient Active Problem List   Diagnosis Date Noted   Rheumatoid arthritis ([place]) 01/24/2022   CAP (community acquired pneumonia) 01/21/2022   Sepsis ([place]) secondary to community-acquired pneumonia and UTI 01/21/2022   Acute on chronic systolic CHF (congestive heart failure) ([place]) 01/21/2022   UTI (urinary tract infection) 01/21/2022   HLD (hyperlipidemia) 01/21/2022   Chronic kidney disease, stage 3b ([place]) 01/21/2022   Sepsis due to pneumonia ([place])    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   Closed fracture of femur, intertrochanteric, left, initial encounter ([place]) 03/27/2020   Accidental fall 03/27/2020   Preoperative clearance 03/27/2020   Closed left hip fracture, initial encounter ([place]) 03/27/2020   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   Atrial fibrillation, chronic ([place]) 09/16/2017   History of subarachnoid hemorrhage 07/21/2017   Prediabetes 03/21/2017   GAD (generalized anxiety disorder) 02/01/2016   Hypercalcemia  02/01/2016   Essential hypertension 01/04/2015   Stage 3b chronic kidney disease ([place]) 08/24/2014   Pure hypercholesterolemia 02/09/2014   H/O rheumatoid arthritis 10/20/2013     Past Surgical History   Past Surgical History:  Procedure Laterality Date   ABDOMINAL HYSTERECTOMY     APPENDECTOMY     as a child   CATARACT EXTRACTION W/PHACO Left 01/10/2015   Procedure: CATARACT EXTRACTION PHACO AND INTRAOCULAR LENS PLACEMENT ([place]);  Surgeon: Ronnell Freshwater, MD;  Location: [facility];  Service: Ophthalmology;  Laterality: Left;   COMPLEX WOUND CLOSURE Left 11/15/2021   Procedure: COMPLEX WOUND CLOSURE;  Surgeon: Margaretha Sheffield, MD;  Location: [facility];  Service: ENT;  Laterality: Left;   EXCISION MASS HEAD Left 11/15/2021   Procedure: EXCISION MALIGNANT MASS EAR;  Surgeon: Margaretha Sheffield, MD;  Location: [facility];  Service: ENT;  Laterality: Left;   INTRAMEDULLARY (IM) NAIL INTERTROCHANTERIC Left 03/28/2020   Procedure: INTRAMEDULLARY (IM) NAIL INTERTROCHANTRIC-Hip Fracture;  Surgeon: Leim Fabry, MD;  Location: [facility];  Service: Orthopedics;  Laterality: Left;   TONSILLECTOMY       Home Medications   Prior to Admission medications   Medication Sig Start Date End Date Taking? Authorizing Provider  albuterol (VENTOLIN HFA) 108 (90 Base) MCG/ACT inhaler Inhale 2 puffs into the lungs every 4 (four) hours as needed for wheezing or shortness of breath. 05/03/22  Yes Paulette Blanch, MD  azithromycin (ZITHROMAX) 250 MG tablet Take  1 tablet (250 mg total) by mouth daily. 05/03/22  Yes Paulette Blanch, MD  chlorpheniramine-HYDROcodone (TUSSIONEX) 10-8 MG/5ML Take 5 mLs by mouth every 12 (twelve) hours as needed for cough. 05/03/22  Yes Paulette Blanch, MD  acetaminophen (TYLENOL) 650 MG CR tablet Take 650 mg by mouth 2 (two) times daily.    [provider]  diltiazem (CARDIZEM CD) 240 MG 24 hr capsule Take 240 mg by mouth daily. 09/13/21   [provider]  furosemide  (LASIX) 40 MG tablet Take 40 mg by mouth daily. 09/12/21   [provider]  sennosides-docusate sodium (SENOKOT-S) 8.6-50 MG tablet Take 2 tablets by mouth 2 (two) times daily.    [provider]  simvastatin (ZOCOR) 10 MG tablet Take 1 tablet (10 mg total) by mouth every evening. 01/25/22 01/25/23  Annita Brod, MD  traMADol (ULTRAM) 50 MG tablet Take 1 tablet (50 mg total) by mouth every 6 (six) hours as needed for moderate pain. 01/25/22   Annita Brod, MD     Allergies  Metoprolol, Penicillins, Methotrexate derivatives, and Doxycycline hyclate   Family History   Family History  Problem Relation Age of Onset   Cervical cancer Mother    Heart attack Father    Prostate cancer Father    Asthma Brother      Physical Exam  Triage Vital Signs: ED Triage Vitals  Enc Vitals Group     BP 05/03/22 0303 120/81     Pulse Rate 05/03/22 0303 (!) 110     Resp 05/03/22 0303 18     Temp 05/03/22 0303 98.2 F (36.8 C)     Temp Source 05/03/22 0303 Oral     SpO2 05/03/22 0303 97 %     Weight 05/03/22 0304 140 lb (63.5 kg)     Height 05/03/22 0304 '5\' 5"'$  (1.651 m)     Head Circumference --      Peak Flow --      Pain Score 05/03/22 0303 8     Pain Loc --      Pain Edu? --      Excl. in [place]? --     Updated Vital Signs: BP 120/81 (BP Location: Right Arm)   Pulse (!) 110   Temp 98.2 F (36.8 C) (Oral)   Resp 18   Ht '5\' 5"'$  (1.651 m)   Wt 63.5 kg   SpO2 97%   BMI 23.30 kg/m    General: Awake, no distress.  CV:  Irregular rhythm, regular rate.  Good peripheral perfusion.  Resp:  Normal effort.  Slightly diminished, otherwise CTAB. Abd:  Nontender.  No distention.  Other:  1+ BLE pedal edema.  Mildly erythematous oropharynx without tonsillar swelling, exudates or peritonsillar abscess.  Mild shotty anterior cervical lymphadenopathy.   ED Results / Procedures / Treatments  Labs (all labs ordered are listed, but only abnormal results are displayed) Labs  Reviewed  COMPREHENSIVE METABOLIC PANEL - Abnormal; Notable for the following components:      Result Value   Glucose, Bld 100 (*)    Creatinine, Ser 1.28 (*)    GFR, Estimated 41 (*)    All other components within normal limits  GROUP A STREP BY PCR  RESP PANEL BY RT-PCR (RSV, FLU A&B, COVID)  [id number]  CBC WITH DIFFERENTIAL/PLATELET  TROPONIN I (HIGH SENSITIVITY)     EKG  ED ECG REPORT I, Lerline Valdivia J, the attending physician, personally viewed and interpreted this ECG.   Date:  05/03/2022  EKG Time: 0314  Rate: 118  Rhythm: atrial fibrillation, rate 118  Axis: Normal  Intervals:none  ST&T Change: Nonspecific    RADIOLOGY I have independently visualized and interpreted patient's chest x-ray as well as noted the radiology interpretation:  X-ray: No acute cardiopulmonary process  Official radiology report(s): DG Chest 1 View  Result Date: 05/03/2022 CLINICAL DATA:  Productive cough EXAM: PORTABLE CHEST 1 VIEW COMPARISON:  01/21/2022 FINDINGS: Cardiac shadow is within normal limits. Aortic calcifications are seen. The lungs are well aerated bilaterally. No focal infiltrate or effusion is seen. No bony abnormality is noted. IMPRESSION: No acute abnormality noted. Electronically Signed   By: Inez Catalina M.D.   On: 05/03/2022 03:44     PROCEDURES:  Critical Care performed: No  Procedures   MEDICATIONS ORDERED IN ED: Medications  magic mouthwash (has no administration in time range)  chlorpheniramine-HYDROcodone (TUSSIONEX) 10-8 MG/5ML suspension 5 mL (has no administration in time range)  azithromycin (ZITHROMAX) tablet 500 mg (has no administration in time range)     IMPRESSION / MDM / ASSESSMENT AND PLAN / ED COURSE  I reviewed the triage vital signs and the nursing notes.                             89 year old female presenting with cold-like symptoms. Differential includes, but is not limited to, viral syndrome, bronchitis including COPD exacerbation,  pneumonia, reactive airway disease including asthma, CHF including exacerbation with or without pulmonary/interstitial edema, pneumothorax, ACS, thoracic trauma, and pulmonary embolism.  I have personally reviewed patient's records and note a podiatry office visit on 04/13/2022 for foot ulcer.  Patient's presentation is most consistent with acute presentation with potential threat to life or bodily function.  Laboratory results demonstrate normal WBC 9.1, stable creatinine compared to prior, negative troponin.  Respiratory panel, group A strep and chest x-ray all unremarkable.  Patient is afebrile without clinical signs for sepsis.  She is not tachypneic nor hypoxic.  Heart rate improved, 95.  Will treat symptomatically with Tussionex and Magic mouthwash.  Start Z-Pak empirically to prevent pneumonia.  Patient requesting TED hose.  Strict return precautions given.  Patient and daughter verbalized understanding and agree with plan of care.   FINAL CLINICAL IMPRESSION(S) / ED DIAGNOSES   Final diagnoses:  Viral illness  Sore throat  Acute cough  Pharyngitis, unspecified etiology     Rx / DC Orders   ED Discharge Orders          Ordered    albuterol (VENTOLIN HFA) 108 (90 Base) MCG/ACT inhaler  Every 4 hours PRN        05/03/22 0420    azithromycin (ZITHROMAX) 250 MG tablet  Daily        05/03/22 0420    chlorpheniramine-HYDROcodone (TUSSIONEX) 10-8 MG/5ML  Every 12 hours PRN        05/03/22 0420             Note:  This document was prepared using Dragon voice recognition software and may include unintentional dictation errors.   Paulette Blanch, MD 05/03/22 [phone]

## 2022-05-03 NOTE — ED Triage Notes (Signed)
Pt to ED from [facility] assisted living BIB daughter c/o productive yellow cough, chills, body aches, diarrhea, sore throat since Saturday.  Pt A&Ox4, chest rise even and unlabored, skin WNL, and in NAD at this time.

## 2023-02-15 ENCOUNTER — Observation Stay
Admission: EM | Admit: 2023-02-15 | Discharge: 2023-03-01 | Disposition: A | Discharge status: Skilled Nursing Facility | Payer: Medicare Other | Attending: Internal Medicine | Admitting: Internal Medicine

## 2023-02-15 ENCOUNTER — Emergency Department: Payer: Medicare Other

## 2023-02-15 ENCOUNTER — Other Ambulatory Visit: Payer: Self-pay

## 2023-02-15 DIAGNOSIS — Z79899 Other long term (current) drug therapy: Secondary | ICD-10-CM | POA: Diagnosis not present

## 2023-02-15 DIAGNOSIS — N1832 Chronic kidney disease, stage 3b: Secondary | ICD-10-CM | POA: Diagnosis not present

## 2023-02-15 DIAGNOSIS — R5381 Other malaise: Secondary | ICD-10-CM | POA: Insufficient documentation

## 2023-02-15 DIAGNOSIS — J189 Pneumonia, unspecified organism: Secondary | ICD-10-CM | POA: Insufficient documentation

## 2023-02-15 DIAGNOSIS — R0602 Shortness of breath: Secondary | ICD-10-CM | POA: Diagnosis present

## 2023-02-15 DIAGNOSIS — R339 Retention of urine, unspecified: Secondary | ICD-10-CM | POA: Diagnosis not present

## 2023-02-15 DIAGNOSIS — I130 Hypertensive heart and chronic kidney disease with heart failure and stage 1 through stage 4 chronic kidney disease, or unspecified chronic kidney disease: Secondary | ICD-10-CM | POA: Insufficient documentation

## 2023-02-15 DIAGNOSIS — D649 Anemia, unspecified: Secondary | ICD-10-CM | POA: Insufficient documentation

## 2023-02-15 DIAGNOSIS — Z1152 Encounter for screening for COVID-19: Secondary | ICD-10-CM | POA: Insufficient documentation

## 2023-02-15 DIAGNOSIS — I482 Chronic atrial fibrillation, unspecified: Secondary | ICD-10-CM | POA: Diagnosis present

## 2023-02-15 DIAGNOSIS — E041 Nontoxic single thyroid nodule: Secondary | ICD-10-CM | POA: Diagnosis not present

## 2023-02-15 DIAGNOSIS — J449 Chronic obstructive pulmonary disease, unspecified: Secondary | ICD-10-CM | POA: Insufficient documentation

## 2023-02-15 DIAGNOSIS — Z87891 Personal history of nicotine dependence: Secondary | ICD-10-CM | POA: Insufficient documentation

## 2023-02-15 DIAGNOSIS — U071 COVID-19: Principal | ICD-10-CM | POA: Diagnosis present

## 2023-02-15 DIAGNOSIS — R109 Unspecified abdominal pain: Secondary | ICD-10-CM | POA: Diagnosis not present

## 2023-02-15 DIAGNOSIS — I5023 Acute on chronic systolic (congestive) heart failure: Secondary | ICD-10-CM | POA: Diagnosis present

## 2023-02-15 DIAGNOSIS — K5900 Constipation, unspecified: Secondary | ICD-10-CM | POA: Diagnosis not present

## 2023-02-15 DIAGNOSIS — J157 Pneumonia due to Mycoplasma pneumoniae: Secondary | ICD-10-CM | POA: Diagnosis present

## 2023-02-15 DIAGNOSIS — R531 Weakness: Secondary | ICD-10-CM

## 2023-02-15 LAB — CBC WITH DIFFERENTIAL/PLATELET
Abs Immature Granulocytes: 0.08 10*3/uL — ABNORMAL HIGH (ref 0.00–0.07)
Basophils Absolute: 0 10*3/uL (ref 0.0–0.1)
Basophils Relative: 0 %
Eosinophils Absolute: 0.3 10*3/uL (ref 0.0–0.5)
Eosinophils Relative: 3 %
HCT: 37.4 % (ref 36.0–46.0)
Hemoglobin: 11.8 g/dL — ABNORMAL LOW (ref 12.0–15.0)
Immature Granulocytes: 1 %
Lymphocytes Relative: 14 %
Lymphs Abs: 1.1 10*3/uL (ref 0.7–4.0)
MCH: 29.7 pg (ref 26.0–34.0)
MCHC: 31.6 g/dL (ref 30.0–36.0)
MCV: 94.2 fL (ref 80.0–100.0)
Monocytes Absolute: 0.6 10*3/uL (ref 0.1–1.0)
Monocytes Relative: 7 %
Neutro Abs: 6 10*3/uL (ref 1.7–7.7)
Neutrophils Relative %: 75 %
Platelets: 230 10*3/uL (ref 150–400)
RBC: 3.97 MIL/uL (ref 3.87–5.11)
RDW: 13 % (ref 11.5–15.5)
WBC: 8.1 10*3/uL (ref 4.0–10.5)
nRBC: 0 % (ref 0.0–0.2)

## 2023-02-15 LAB — COMPREHENSIVE METABOLIC PANEL
ALT: 10 U/L (ref 0–44)
AST: 16 U/L (ref 15–41)
Albumin: 3.2 g/dL — ABNORMAL LOW (ref 3.5–5.0)
Alkaline Phosphatase: 81 U/L (ref 38–126)
Anion gap: 11 (ref 5–15)
BUN: 17 mg/dL (ref 8–23)
CO2: 25 mmol/L (ref 22–32)
Calcium: 9.6 mg/dL (ref 8.9–10.3)
Chloride: 100 mmol/L (ref 98–111)
Creatinine, Ser: 1.15 mg/dL — ABNORMAL HIGH (ref 0.44–1.00)
GFR, Estimated: 46 mL/min — ABNORMAL LOW (ref >60)
Glucose, Bld: 102 mg/dL — ABNORMAL HIGH (ref 70–99)
Potassium: 4.2 mmol/L (ref 3.5–5.1)
Sodium: 136 mmol/L (ref 135–145)
Total Bilirubin: 0.6 mg/dL (ref 0.3–1.2)
Total Protein: 7.2 g/dL (ref 6.5–8.1)

## 2023-02-15 LAB — PROCALCITONIN: Procalcitonin: <0.1 ng/mL

## 2023-02-15 LAB — D-DIMER, QUANTITATIVE: D-Dimer, Quant: 12.63 ug{FEU}/mL — ABNORMAL HIGH (ref 0.00–0.50)

## 2023-02-15 LAB — TSH: TSH: 0.446 u[IU]/mL (ref 0.350–4.500)

## 2023-02-15 LAB — SARS CORONAVIRUS 2 BY RT PCR: SARS Coronavirus 2 by RT PCR: POSITIVE — AB

## 2023-02-15 MED ORDER — AZITHROMYCIN 500 MG PO TABS
500.0000 mg | ORAL_TABLET | Freq: Every day | ORAL | Status: DC
  Administered 2023-02-15 – 2023-02-16 (×2): 500 mg via ORAL
  Filled 2023-02-15 (×2): qty 1

## 2023-02-15 MED ORDER — ASPIRIN 81 MG PO TBEC
81.0000 mg | DELAYED_RELEASE_TABLET | Freq: Every day | ORAL | Status: DC
  Administered 2023-02-15 – 2023-02-25 (×10): 81 mg via ORAL
  Filled 2023-02-15 (×11): qty 1

## 2023-02-15 MED ORDER — TRAMADOL HCL 50 MG PO TABS
50.0000 mg | ORAL_TABLET | Freq: Four times a day (QID) | ORAL | Status: DC | PRN
  Administered 2023-02-15 – 2023-02-27 (×14): 50 mg via ORAL
  Filled 2023-02-15 (×14): qty 1

## 2023-02-15 MED ORDER — HYDROCOD POLI-CHLORPHE POLI ER 10-8 MG/5ML PO SUER
5.0000 mL | Freq: Two times a day (BID) | ORAL | Status: DC | PRN
  Administered 2023-02-15 – 2023-02-20 (×7): 5 mL via ORAL
  Filled 2023-02-15 (×8): qty 5

## 2023-02-15 MED ORDER — FUROSEMIDE 40 MG PO TABS
40.0000 mg | ORAL_TABLET | Freq: Every day | ORAL | Status: DC
  Administered 2023-02-16 – 2023-02-17 (×2): 40 mg via ORAL
  Filled 2023-02-15 (×2): qty 1

## 2023-02-15 MED ORDER — DIGOXIN 0.25 MG/ML IJ SOLN
125.0000 ug | Freq: Four times a day (QID) | INTRAMUSCULAR | Status: AC
  Administered 2023-02-15 (×2): 125 ug via INTRAVENOUS
  Filled 2023-02-15 (×4): qty 2

## 2023-02-15 MED ORDER — IOHEXOL 350 MG/ML SOLN
75.0000 mL | Freq: Once | INTRAVENOUS | Status: AC | PRN
  Administered 2023-02-15: 75 mL via INTRAVENOUS

## 2023-02-15 MED ORDER — SIMVASTATIN 20 MG PO TABS
10.0000 mg | ORAL_TABLET | Freq: Every evening | ORAL | Status: DC
  Administered 2023-02-15 – 2023-02-28 (×14): 10 mg via ORAL
  Filled 2023-02-15 (×14): qty 1

## 2023-02-15 MED ORDER — ACETAMINOPHEN ER 650 MG PO TBCR: 650.0000 mg | EXTENDED_RELEASE_TABLET | Freq: Two times a day (BID) | ORAL | Status: DC

## 2023-02-15 MED ORDER — ACETAMINOPHEN 325 MG PO TABS
650.0000 mg | ORAL_TABLET | Freq: Four times a day (QID) | ORAL | Status: DC | PRN
  Administered 2023-02-17: 650 mg via ORAL
  Filled 2023-02-15 (×2): qty 2

## 2023-02-15 MED ORDER — GUAIFENESIN ER 600 MG PO TB12
600.0000 mg | ORAL_TABLET | Freq: Two times a day (BID) | ORAL | Status: DC
  Administered 2023-02-15 – 2023-03-01 (×29): 600 mg via ORAL
  Filled 2023-02-15 (×29): qty 1

## 2023-02-15 MED ORDER — HEPARIN SODIUM (PORCINE) 5000 UNIT/ML IJ SOLN
5000.0000 [IU] | Freq: Two times a day (BID) | INTRAMUSCULAR | Status: DC
  Administered 2023-02-15 – 2023-03-01 (×29): 5000 [IU] via SUBCUTANEOUS
  Filled 2023-02-15 (×29): qty 1

## 2023-02-15 MED ORDER — LABETALOL HCL 5 MG/ML IV SOLN: 5.0000 mg | INTRAVENOUS | Status: DC | PRN

## 2023-02-15 MED ORDER — ONDANSETRON HCL 4 MG/2ML IJ SOLN
4.0000 mg | Freq: Four times a day (QID) | INTRAMUSCULAR | Status: DC | PRN
  Administered 2023-02-23: 4 mg via INTRAVENOUS
  Filled 2023-02-15: qty 2

## 2023-02-15 MED ORDER — DILTIAZEM HCL ER COATED BEADS 240 MG PO CP24
240.0000 mg | ORAL_CAPSULE | Freq: Every day | ORAL | Status: DC
  Administered 2023-02-16 – 2023-02-20 (×5): 240 mg via ORAL
  Filled 2023-02-15 (×6): qty 1

## 2023-02-15 MED ORDER — ONDANSETRON HCL 4 MG PO TABS: 4.0000 mg | ORAL_TABLET | Freq: Four times a day (QID) | ORAL | Status: DC | PRN

## 2023-02-15 MED ORDER — SENNOSIDES-DOCUSATE SODIUM 8.6-50 MG PO TABS
2.0000 | ORAL_TABLET | Freq: Two times a day (BID) | ORAL | Status: DC
  Administered 2023-02-15 – 2023-02-25 (×22): 2 via ORAL
  Filled 2023-02-15 (×22): qty 2

## 2023-02-15 MED ORDER — IPRATROPIUM BROMIDE 0.02 % IN SOLN: 0.5000 mg | Freq: Four times a day (QID) | RESPIRATORY_TRACT | Status: DC | PRN

## 2023-02-15 MED ORDER — ALBUTEROL SULFATE HFA 108 (90 BASE) MCG/ACT IN AERS: 2.0000 | INHALATION_SPRAY | RESPIRATORY_TRACT | Status: DC | PRN

## 2023-02-15 MED ORDER — NIRMATRELVIR/RITONAVIR (PAXLOVID) TABLET (RENAL DOSING)
2.0000 | ORAL_TABLET | Freq: Two times a day (BID) | ORAL | Status: DC
  Filled 2023-02-15: qty 20

## 2023-02-15 MED ORDER — ACETAMINOPHEN 650 MG RE SUPP
650.0000 mg | Freq: Four times a day (QID) | RECTAL | Status: DC | PRN
  Filled 2023-02-15: qty 1

## 2023-02-15 MED ORDER — SODIUM CHLORIDE 0.9 % IV BOLUS
1000.0000 mL | Freq: Once | INTRAVENOUS | Status: AC
  Administered 2023-02-15: 1000 mL via INTRAVENOUS

## 2023-02-15 MED ORDER — FUROSEMIDE 40 MG PO TABS: 40.0000 mg | ORAL_TABLET | Freq: Every day | ORAL | Status: DC

## 2023-02-15 NOTE — ED Notes (Signed)
Patient transported to X-ray 

## 2023-02-15 NOTE — ED Notes (Signed)
Informed RN bed assigned 

## 2023-02-15 NOTE — Plan of Care (Signed)
[facility] Tonsillectomy/Adenoidectomy, Postoperative PEDS care plan entered in error.

## 2023-02-15 NOTE — ED Triage Notes (Signed)
Pt from [facility] for SOB, cough, CoVID +  Symptomatic for more than 1 week.

## 2023-02-15 NOTE — ED Notes (Addendum)
Pt assisted to bedside commode and changed into hospital gown and socks at this time.

## 2023-02-15 NOTE — H&P (Signed)
History and Physical    Roberta Ross WUJ:[id number] DOB: 1933-06-16 DOA: 02/15/2023  PCP: Lauro Regulus, MD (Confirm with patient/family/NH records and if not entered, this has to be entered at [facility] point of entry) Patient coming from: SNF  I have personally briefly reviewed patient's old medical records in [facility]  Chief Complaint: Cough, SOB  HPI: Roberta Ross is a 89 y.o. female with medical history significant of chronic HFrEF with LVEF 30-35%, HTN, RA with severe finger deformity, HLD, CKD stage IIIb, PAF not on anticoagulation due to history of SAH sent from nursing home for evaluation of worsening of cough shortness of breath.  Symptoms started last week initially was a dry cough and then turned productive with whitish phlegm, and according to the patient she also had low-grade fever at nursing home.  On last Thursday, patient was tested positive for COVID, and patient was treated a total of 5 days of Paxlovid.  Despite that, patient continued to experience productive cough and increasing exertional dyspnea and low-grade fever throughout most of this week.  She denies any chest pain or palpitations.  ED Course: In rapid A-fib heart rate 110-120s, blood pressure borderline low respiratory rate 30, O2 saturation 100% on room air.  CTA negative for PE but pulmonary artery congestion with reflux of the IV contrast, blood work showed creatinine 1.1, bicarb 25, procalcitonin less than 0.1, WBC 8.1.  Patient was given 1 L IV bolus in the ED  Review of Systems: As per HPI otherwise 14 point review of systems negative.    Past Medical History:  Diagnosis Date   Anemia    past hx   Arthritis    hands and feet   Chronic kidney disease    COPD (chronic obstructive pulmonary disease) (HCC)    Dyspnea    Dysrhythmia    Pt reports slow HR and papatations   Heart murmur    past hx   Hypercholesteremia    Hypertension    Rheumatic fever    (x2) in past   Wears  dentures    full upper    Past Surgical History:  Procedure Laterality Date   ABDOMINAL HYSTERECTOMY     APPENDECTOMY     as a child   CATARACT EXTRACTION W/PHACO Left 01/10/2015   Procedure: CATARACT EXTRACTION PHACO AND INTRAOCULAR LENS PLACEMENT (IOC);  Surgeon: Sherald Hess, MD;  Location: [facility];  Service: Ophthalmology;  Laterality: Left;   COMPLEX WOUND CLOSURE Left 11/15/2021   Procedure: COMPLEX WOUND CLOSURE;  Surgeon: Vernie Murders, MD;  Location: [facility];  Service: ENT;  Laterality: Left;   EXCISION MASS HEAD Left 11/15/2021   Procedure: EXCISION MALIGNANT MASS EAR;  Surgeon: Vernie Murders, MD;  Location: [facility];  Service: ENT;  Laterality: Left;   INTRAMEDULLARY (IM) NAIL INTERTROCHANTERIC Left 03/28/2020   Procedure: INTRAMEDULLARY (IM) NAIL INTERTROCHANTRIC-Hip Fracture;  Surgeon: Signa Kell, MD;  Location: [facility];  Service: Orthopedics;  Laterality: Left;   TONSILLECTOMY       reports that she quit smoking about 4 years ago. Her smoking use included cigarettes. She started smoking about 64 years ago. She has a 15 pack-year smoking history. She has never used smokeless tobacco. She reports that she does not currently use drugs. She reports that she does not drink alcohol.  Allergies  Allergen Reactions   Metoprolol Shortness Of Breath   Penicillins Other (See Comments)    Internal swelling including throat TOLERATES CEFTRIAXONE   Methotrexate Derivatives Nausea  Only   Doxycycline Hyclate Rash    Family History  Problem Relation Age of Onset   Cervical cancer Mother    Heart attack Father    Prostate cancer Father    Asthma Brother      Prior to Admission medications   Medication Sig Start Date End Date Taking? Authorizing Provider  acetaminophen (TYLENOL) 650 MG CR tablet Take 650 mg by mouth 2 (two) times daily.    [provider]  albuterol (VENTOLIN HFA) 108 (90 Base) MCG/ACT inhaler Inhale 2 puffs into the lungs  every 4 (four) hours as needed for wheezing or shortness of breath. 05/03/22   Irean Hong, MD  azithromycin (ZITHROMAX) 250 MG tablet Take 1 tablet (250 mg total) by mouth daily. 05/03/22   Irean Hong, MD  chlorpheniramine-HYDROcodone (TUSSIONEX) 10-8 MG/5ML Take 5 mLs by mouth every 12 (twelve) hours as needed for cough. 05/03/22   Irean Hong, MD  diltiazem (CARDIZEM CD) 240 MG 24 hr capsule Take 240 mg by mouth daily. 09/13/21   [provider]  furosemide (LASIX) 40 MG tablet Take 40 mg by mouth daily. 09/12/21   [provider]  sennosides-docusate sodium (SENOKOT-S) 8.6-50 MG tablet Take 2 tablets by mouth 2 (two) times daily.    [provider]  simvastatin (ZOCOR) 10 MG tablet Take 1 tablet (10 mg total) by mouth every evening. 01/25/22 01/25/23  Hollice Espy, MD  traMADol (ULTRAM) 50 MG tablet Take 1 tablet (50 mg total) by mouth every 6 (six) hours as needed for moderate pain. 01/25/22   Hollice Espy, MD    Physical Exam: Vitals:   02/15/23 1100 02/15/23 1115 02/15/23 1200 02/15/23 1300  BP: 97/77  109/76 (!) 104/92  Pulse: 100  (!) 117 (!) 118  Resp: (!) 25  (!) 26 (!) 26  Temp:  98 F (36.7 C)    TempSrc:  Oral    SpO2: 98%  98% 99%  Weight:      Height:        Constitutional: NAD, calm, comfortable Vitals:   02/15/23 1100 02/15/23 1115 02/15/23 1200 02/15/23 1300  BP: 97/77  109/76 (!) 104/92  Pulse: 100  (!) 117 (!) 118  Resp: (!) 25  (!) 26 (!) 26  Temp:  98 F (36.7 C)    TempSrc:  Oral    SpO2: 98%  98% 99%  Weight:      Height:       Eyes: PERRL, lids and conjunctivae normal ENMT: Mucous membranes are moist. Posterior pharynx clear of any exudate or lesions.Normal dentition.  Neck: normal, supple, no masses, no thyromegaly Respiratory: clear to auscultation bilaterally, no wheezing, no crackles. Normal respiratory effort. No accessory muscle use.  Cardiovascular: Irregular heart rate and tachycardia, soft systolic murmur on  apex. No extremity edema. 2+ pedal pulses. No carotid bruits.  Abdomen: no tenderness, no masses palpated. No hepatosplenomegaly. Bowel sounds positive.  Musculoskeletal: no clubbing / cyanosis. No joint deformity upper and lower extremities. Good ROM, no contractures. Normal muscle tone.  Skin: no rashes, lesions, ulcers. No induration Neurologic: CN 2-12 grossly intact. Sensation intact, DTR normal. Strength 5/5 in all 4.  Psychiatric: Normal judgment and insight. Alert and oriented x 3. Normal mood.     Labs on Admission: I have personally reviewed following labs and imaging studies  CBC: Recent Labs  Lab 02/15/23 0813  WBC 8.1  NEUTROABS 6.0  HGB 11.8*  HCT 37.4  MCV 94.2  PLT 230   Basic Metabolic Panel: Recent Labs  Lab 02/15/23 0813  NA 136  K 4.2  CL 100  CO2 25  GLUCOSE 102*  BUN 17  CREATININE 1.15*  CALCIUM 9.6   GFR: Estimated Creatinine Clearance: 30.2 mL/min (A) (by C-G formula based on SCr of 1.15 mg/dL (H)). Liver Function Tests: Recent Labs  Lab 02/15/23 0813  AST 16  ALT 10  ALKPHOS 81  BILITOT 0.6  PROT 7.2  ALBUMIN 3.2*   No results for input(s): "LIPASE", "AMYLASE" in the last 168 hours. No results for input(s): "AMMONIA" in the last 168 hours. Coagulation Profile: No results for input(s): "INR", "PROTIME" in the last 168 hours. Cardiac Enzymes: No results for input(s): "CKTOTAL", "CKMB", "CKMBINDEX", "TROPONINI" in the last 168 hours. BNP (last 3 results) No results for input(s): "PROBNP" in the last 8760 hours. HbA1C: No results for input(s): "HGBA1C" in the last 72 hours. CBG: No results for input(s): "GLUCAP" in the last 168 hours. Lipid Profile: No results for input(s): "CHOL", "HDL", "LDLCALC", "TRIG", "CHOLHDL", "LDLDIRECT" in the last 72 hours. Thyroid Function Tests: No results for input(s): "TSH", "T4TOTAL", "FREET4", "T3FREE", "THYROIDAB" in the last 72 hours. Anemia Panel: No results for input(s): "VITAMINB12",  "FOLATE", "FERRITIN", "TIBC", "IRON", "RETICCTPCT" in the last 72 hours. Urine analysis:    Component Value Date/Time   COLORURINE YELLOW 01/21/2022 1250   APPEARANCEUR CLEAR 01/21/2022 1250   LABSPEC 1.015 01/21/2022 1250   PHURINE 5.5 01/21/2022 1250   GLUCOSEU NEGATIVE 01/21/2022 1250   HGBUR SMALL (A) 01/21/2022 1250   BILIRUBINUR NEGATIVE 01/21/2022 1250   KETONESUR TRACE (A) 01/21/2022 1250   PROTEINUR 100 (A) 01/21/2022 1250   NITRITE POSITIVE (A) 01/21/2022 1250   LEUKOCYTESUR TRACE (A) 01/21/2022 1250    Radiological Exams on Admission: CT Angio Chest PE W and/or Wo Contrast  Result Date: 02/15/2023 CLINICAL DATA:  Pulmonary embolism (PE) suspected, high prob. Shortness of breath. Cough. EXAM: CT ANGIOGRAPHY CHEST WITH CONTRAST TECHNIQUE: Multidetector CT imaging of the chest was performed using the standard protocol during bolus administration of intravenous contrast. Multiplanar CT image reconstructions and MIPs were obtained to evaluate the vascular anatomy. RADIATION DOSE REDUCTION: This exam was performed according to the departmental dose-optimization program which includes automated exposure control, adjustment of the mA and/or kV according to patient size and/or use of iterative reconstruction technique. CONTRAST:  75mL OMNIPAQUE IOHEXOL 350 MG/ML SOLN COMPARISON:  None Available. FINDINGS: Cardiovascular: No evidence of embolism to the proximal subsegmental pulmonary artery level. Mild cardiomegaly. No pericardial effusion. 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 No significant interval change. There are coronary artery calcifications, in keeping with coronary artery disease. There are also moderate peripheral atherosclerotic vascular calcifications of thoracic aorta and its major branches. There is reflux of contrast into the intrahepatic IVC and central hepatic veins, suggesting right heart strain/failure. There  is slightly lobulated area surrounding the left atrial appendage, which has internal CT attenuation of 56-66 Hounsfield units. This is of indeterminate etiology. This area is not seen on the prior examination so comparison can not be made. Further evaluation with echocardiography is recommended. Mediastinum/Nodes: There is a 1.1 x 1.7 cm hypoattenuating nodule in the right thyroid lobe which exhibits few calcifications along the inferior aspect. This is incompletely characterized on the current examination. The nodule meets the size criteria for follow-up thyroid ultrasound evaluation. There is an additional subcentimeter hypoattenuating nodule in the inferior left thyroid lobe. The  esophagus is nondistended precluding optimal assessment. There are few mildly prominent mediastinal and hilar lymph nodes, which do not meet the size criteria for lymphadenopathy and though indeterminate most likely benign in etiology. No axillary lymphadenopathy by size criteria. Lungs/Pleura: The central tracheo-bronchial tree is patent. There is mild, smooth, circumferential thickening of the segmental and subsegmental bronchial walls, throughout bilateral lungs, which is nonspecific. Findings are most commonly seen with bronchitis or reactive airway disease, such as asthma. There are patchy areas of linear, plate-like atelectasis and/or scarring throughout bilateral lungs. There are also patchy subcentimeter sized ground-glass opacities in the right lung apex, nonspecific but likely sequela of infection or inflammation. Mild upper lobe predominant centrilobular emphysema noted. No mass or consolidation. No pleural effusion or pneumothorax. No suspicious lung nodules. Upper Abdomen: Visualized upper abdominal viscera within normal limits. Surgically absent gallbladder. Musculoskeletal: The visualized soft tissues of the chest wall are grossly unremarkable. No suspicious osseous lesions. There are mild to moderate multilevel  degenerative changes in the visualized spine. Review of the MIP images confirms the above findings. IMPRESSION: 1. No evidence of pulmonary embolism to the proximal subsegmental pulmonary artery level. 2. Reflux of contrast into the intrahepatic IVC and central hepatic veins, suggesting right heart strain/failure. 3. Mild fusiform aneurysmal dilation of the descending thoracic aorta measuring up to 3.8 cm in diameter. No significant interval change. 4. Lobulated area surrounding the left atrial appendage, which has internal CT attenuation of 56-66 Hounsfield units. This is of indeterminate etiology. This area is not seen on the prior examination so comparison can not be made. Further evaluation with echocardiography patchy 5. Patchy subcentimeter sized ground-glass opacities in the right lung apex, nonspecific but likely sequela of infection or inflammation. 6. There is a 1.1 x 1.7 cm hypoattenuating nodule in the right thyroid lobe which exhibits few calcifications along the inferior aspect. The nodule meets the size criteria for follow-up nonemergent thyroid ultrasound evaluation. Aortic Atherosclerosis (ICD10-I70.0) and Emphysema (ICD10-J43.9). Electronically Signed   By: Jules Schick M.D.   On: 02/15/2023 11:18   DG Chest 2 View  Result Date: 02/15/2023 CLINICAL DATA:  Shortness of breath, COVID positive, symptoms x1 week. EXAM: CHEST - 2 VIEW COMPARISON:  Portable chest 05/03/2022 FINDINGS: The lungs are mildly emphysematous. There is hazy interstitial consolidation peripherally in the right upper lobe mid chest level. Findings concerning for pneumonitis. The remaining lungs are clear. There is no substantial pleural effusion. Stable mediastinum with aortic atherosclerosis and uncoiling. Stable cardiomegaly. No vascular congestion is seen. There is osteopenia, thoracic kyphosis and degenerative change. IMPRESSION: Hazy interstitial consolidation peripherally in the right upper lobe concerning for  pneumonitis. Clinical correlation and radiographic follow-up recommended. Electronically Signed   By: Almira Bar M.D.   On: 02/15/2023 08:01    EKG: Independently reviewed.  A-fib with RVR  Assessment/Plan Principal Problem:   COVID Active Problems:   Acute on chronic systolic CHF (congestive heart failure) (HCC)   Atrial fibrillation, chronic (HCC)   COVID-19 virus infection  (please populate well all problems here in Problem List. (For example, if patient is on BP meds at home and you resume or decide to hold them, it is a problem that needs to be her. Same for CAD, COPD, HLD and so on)  SIRS -Patient came with tachycardia and tachypneic with reported low-grade fever for 1 week, source of infection is less clear as patient already treated with Paxlovid for the COVID infection.  And procalcitonin level is rather low at this point to  indicate any overt infection.  Atypical pneumonia still possible, will send atypical pneumonia study and cover patient with azithromycin for now. -Sputum culture -Breathing treatment and supportive care including incentive spirometry and flutter valve -Change albuterol to Atrovent  Acute on chronic HFrEF decompensation -Likely secondary to uncontrolled A-fib -Probably mild fluid overload however blood pressure borderline low for IV diuresis.  Plan to recheck x-ray and resume p.o. Lasix tomorrow -Case was discussed with on-call [facility] cardiology Dr. Melton Alar for rate control strategy, cardiology recommend as needed labetalol -Continue Cardizem, and also ordered digoxin 0.125 mg every 6 hours x 2 -Check TSH  A-fib with RVR -Rate control as above -Not on anticoagulation due to history of SAH  Incidental finding of left atrium mass -Unclear significance, case was discussed with cardiology, echo ordered  CKD stage IIIa -Mild fluid overload, blood pressure borderline low, hold off diuresis today  Thyroid nodules -Incidental finding, outpatient  follow-up with endocrinology -TSH sent  Deconditioning -PT evaluation  DVT prophylaxis: Heparin subcu Code Status: DNR Family Communication: Daughter at bedside Disposition Plan: Expect less than 2 midnight hospital stay Consults called: Curbside consult with cardiology, reconsult if needed Admission status: Telemetry observation   Emeline General MD [facility] Pager [phone]  02/15/2023, 1:35 PM

## 2023-02-15 NOTE — ED Provider Notes (Signed)
[facility] Provider Note    Event Date/Time   First MD Initiated Contact with Patient 02/15/23 [phone]     (approximate)   History   Shortness of Breath   HPI  Roberta Ross is a 89 y.o. female here with generalized weakness, shortness of breath, fatigue.  The patient was recently diagnosed with COVID last Thursday.  She reportedly has been on Paxlovid.  She has had progressively worsening shortness of breath, poor appetite, and weakness since then.  She has been essentially unable to eat or drink or walk.  She subsequent presents for evaluation.  This is her first episode of COVID.  She received 1 [employer] vaccine originally.  No abdominal pain.  Denies any chest pain.     Physical Exam   Triage Vital Signs: ED Triage Vitals  Encounter Vitals Group     BP 02/15/23 0659 100/70     Systolic BP Percentile --      Diastolic BP Percentile --      Pulse Rate 02/15/23 0659 (!) 110     Resp 02/15/23 0659 (!) 26     Temp 02/15/23 0659 97.9 F (36.6 C)     Temp Source 02/15/23 0659 Oral     SpO2 02/15/23 0659 100 %     Weight 02/15/23 0701 120 lb (54.4 kg)     Height 02/15/23 0701 5\' 5"  (1.651 m)     Head Circumference --      Peak Flow --      Pain Score 02/15/23 0701 10     Pain Loc --      Pain Education --      Exclude from Growth Chart --     Most recent vital signs: Vitals:   02/15/23 1546 02/15/23 2329  BP: 94/76 114/81  Pulse: 85 (!) 146  Resp: 20   Temp: 97.6 F (36.4 C)   SpO2: 95% 95%     General: Awake, no distress.  CV:  Good peripheral perfusion.  Resp:  Tachypneic, with bilateral rales.  Mildly diminished aeration bilaterally. Abd:  No distention.  No tenderness.  No guarding or rebound. Other:  Dry mucous membranes.   ED Results / Procedures / Treatments   Labs (all labs ordered are listed, but only abnormal results are displayed) Labs Reviewed  SARS CORONAVIRUS 2 BY RT PCR - Abnormal; Notable for the  following components:      Result Value   SARS Coronavirus 2 by RT PCR POSITIVE (*)    All other components within normal limits  CBC WITH DIFFERENTIAL/PLATELET - Abnormal; Notable for the following components:   Hemoglobin 11.8 (*)    Abs Immature Granulocytes 0.08 (*)    All other components within normal limits  COMPREHENSIVE METABOLIC PANEL - Abnormal; Notable for the following components:   Glucose, Bld 102 (*)    Creatinine, Ser 1.15 (*)    Albumin 3.2 (*)    GFR, Estimated 46 (*)    All other components within normal limits  D-DIMER, QUANTITATIVE - Abnormal; Notable for the following components:   D-Dimer, Quant 12.63 (*)    All other components within normal limits  CULTURE, BLOOD (ROUTINE X 2)  CULTURE, BLOOD (ROUTINE X 2)  EXPECTORATED SPUTUM ASSESSMENT W GRAM STAIN, RFLX TO RESP C  PROCALCITONIN  TSH  LEGIONELLA PNEUMOPHILA SEROGP 1 UR AG  MYCOPLASMA PNEUMONIAE ANTIBODY, IGM  BASIC METABOLIC PANEL  T4     EKG Atrial fibrillation, ventricular 105.  QRS 70, QTc 495.  No acute ST elevations repress.  No acute ischemic infarct.   RADIOLOGY CXR: Hazy consolidation in RUL concerning for pneumonitis   I also independently reviewed and agree with radiologist interpretations.   PROCEDURES:  Critical Care performed: No   MEDICATIONS ORDERED IN ED: Medications  traMADol (ULTRAM) tablet 50 mg (has no administration in time range)  diltiazem (CARDIZEM CD) 24 hr capsule 240 mg (has no administration in time range)  simvastatin (ZOCOR) tablet 10 mg (10 mg Oral Given 02/15/23 2200)  senna-docusate (Senokot-S) tablet 2 tablet (2 tablets Oral Given 02/15/23 2200)  chlorpheniramine-HYDROcodone (TUSSIONEX) 10-8 MG/5ML suspension 5 mL (5 mLs Oral Given 02/15/23 2335)  heparin injection 5,000 Units (5,000 Units Subcutaneous Given 02/15/23 2200)  acetaminophen (TYLENOL) tablet 650 mg (has no administration in time range)    Or  acetaminophen (TYLENOL) suppository 650 mg  (has no administration in time range)  ondansetron (ZOFRAN) tablet 4 mg (has no administration in time range)    Or  ondansetron (ZOFRAN) injection 4 mg (has no administration in time range)  furosemide (LASIX) tablet 40 mg (has no administration in time range)  azithromycin (ZITHROMAX) tablet 500 mg (500 mg Oral Given 02/15/23 1335)  labetalol (NORMODYNE) injection 5 mg (has no administration in time range)  ipratropium (ATROVENT) nebulizer solution 0.5 mg (has no administration in time range)  aspirin EC tablet 81 mg (81 mg Oral Given 02/15/23 1423)  guaiFENesin (MUCINEX) 12 hr tablet 600 mg (600 mg Oral Given 02/15/23 2200)  sodium chloride 0.9 % bolus 1,000 mL (0 mLs Intravenous Stopped 02/15/23 1015)  iohexol (OMNIPAQUE) 350 MG/ML injection 75 mL (75 mLs Intravenous Contrast Given 02/15/23 0921)  digoxin (LANOXIN) 0.25 MG/ML injection 125 mcg (125 mcg Intravenous Given 02/15/23 2333)     IMPRESSION / MDM / ASSESSMENT AND PLAN / ED COURSE  I reviewed the triage vital signs and the nursing notes.                              Differential diagnosis includes, but is not limited to, COVID-19, COVID-19 related PNA, PE, CHF, ACS  Patient's presentation is most consistent with acute presentation with potential threat to life or bodily function.  The patient is on the cardiac monitor to evaluate for evidence of arrhythmia and/or significant heart rate changes   89 yo F here with generalized weakness in setting of recent COVID-19 diagnosis. Pt appears farigued, dyspneic but is satting well on RA. CXR shows hazy consolidation in RUL concerning for pneumonitis. CBC is without leukocytosis or anemia. CMP is unremarkable. COVID+. Procal negative. D-Dimer 12 so will check CT Angio. Will admit to medicine.   FINAL CLINICAL IMPRESSION(S) / ED DIAGNOSES   Final diagnoses:  COVID-19  Generalized weakness     Rx / DC Orders   ED Discharge Orders     None        Note:  This document  was prepared using Dragon voice recognition software and may include unintentional dictation errors.   Shaune Pollack, MD 02/15/23 [phone]

## 2023-02-16 ENCOUNTER — Observation Stay
Admit: 2023-02-16 | Discharge: 2023-02-16 | Disposition: A | Discharge status: Home / Self Care | Payer: Medicare Other | Attending: Internal Medicine

## 2023-02-16 ENCOUNTER — Observation Stay: Payer: Medicare Other

## 2023-02-16 DIAGNOSIS — U071 COVID-19: Secondary | ICD-10-CM | POA: Diagnosis not present

## 2023-02-16 DIAGNOSIS — I5023 Acute on chronic systolic (congestive) heart failure: Secondary | ICD-10-CM

## 2023-02-16 DIAGNOSIS — I4891 Unspecified atrial fibrillation: Secondary | ICD-10-CM

## 2023-02-16 LAB — EXPECTORATED SPUTUM ASSESSMENT W GRAM STAIN, RFLX TO RESP C

## 2023-02-16 LAB — BASIC METABOLIC PANEL
Anion gap: 9 (ref 5–15)
BUN: 14 mg/dL (ref 8–23)
CO2: 22 mmol/L (ref 22–32)
Calcium: 9.3 mg/dL (ref 8.9–10.3)
Chloride: 106 mmol/L (ref 98–111)
Creatinine, Ser: 1.13 mg/dL — ABNORMAL HIGH (ref 0.44–1.00)
GFR, Estimated: 47 mL/min — ABNORMAL LOW (ref >60)
Glucose, Bld: 98 mg/dL (ref 70–99)
Potassium: 3.9 mmol/L (ref 3.5–5.1)
Sodium: 137 mmol/L (ref 135–145)

## 2023-02-16 LAB — ECHOCARDIOGRAM COMPLETE
Height: 65 in
S' Lateral: 2.8 cm
Weight: 1920 [oz_av]

## 2023-02-16 NOTE — Progress Notes (Addendum)
[address] [place], [place] [address]    Culture PENDING  Incomplete   Report Status PENDING  Incomplete     Procedures and diagnostic studies:  ECHOCARDIOGRAM COMPLETE  Result Date: 02/16/2023    ECHOCARDIOGRAM REPORT   Patient Name:   Roberta Ross Date of Exam: 02/16/2023 Medical Rec #:  [id number]      Height:       65.0 in Accession #:    [id number]     Weight:       120.0 lb Date of Birth:  07-01-1933     BSA:          1.592 m Patient Age:    89 years       BP:           107/96 mmHg Patient Gender: F              HR:           97 bpm. Exam Location:  [facility] Procedure: 2D Echo Indications:     Atrial Fibrillation I48.91  History:         Patient has no prior history of Echocardiogram examinations.  Sonographer:     Overton Mam RDCS, FASE Referring Phys:  [id number] Emeline General Diagnosing Phys: Clotilde Dieter  Sonographer Comments: Technically challenging study due to limited acoustic windows, suboptimal subcostal window and suboptimal apical window. Image acquisition challenging due to respiratory motion and Image acquisition challenging due to uncooperative patient. Unable to complete study due to patient coughing and in pain. Both the patient and daughter requested the test be stopped due to being uncomfortable. IMPRESSIONS  1. Left ventricular ejection fraction, by estimation, is 25 to 30%. The left ventricle has severely decreased function. The left ventricle demonstrates global hypokinesis. Left ventricular diastolic parameters are indeterminate.  2. Right ventricular systolic function is moderately reduced. The right ventricular size is moderately enlarged.  3. Left atrial size was severely dilated.  4. Right atrial size was severely dilated.  5. The mitral valve is grossly normal. Mild mitral valve regurgitation.  6. The aortic valve is grossly normal. Aortic valve regurgitation is trivial. No aortic stenosis is present. FINDINGS  Left Ventricle: Left ventricular ejection fraction, by estimation, is 25 to 30%. The left ventricle has severely decreased function. The left ventricle demonstrates  global hypokinesis. The left ventricular internal cavity size was normal in size. There is no left ventricular hypertrophy. Left ventricular diastolic parameters are indeterminate. Right Ventricle: The right ventricular size is moderately enlarged. No increase in right ventricular wall thickness. Right ventricular systolic function is moderately reduced. Left Atrium: Left atrial size was severely dilated. Right Atrium: Right atrial size was severely dilated. Pericardium: There is no evidence of pericardial effusion. Mitral Valve: The mitral valve is grossly normal. Mild mitral valve regurgitation. Tricuspid Valve: The tricuspid valve is grossly normal. Tricuspid valve regurgitation is mild. Aortic Valve: The aortic valve is grossly normal. Aortic valve regurgitation is trivial. No aortic stenosis is present. Pulmonic Valve: The pulmonic valve was grossly normal. Pulmonic valve regurgitation is trivial. Aorta: The aortic root was not well visualized. IAS/Shunts: No atrial level shunt detected by color flow Doppler.  LEFT VENTRICLE PLAX 2D LVIDd:         4.00 cm LVIDs:         2.80 cm LV PW:         1.20 cm LV IVS:        1.10 cm LVOT diam:  [address] [place], [place] [address]    Culture PENDING  Incomplete   Report Status PENDING  Incomplete     Procedures and diagnostic studies:  ECHOCARDIOGRAM COMPLETE  Result Date: 02/16/2023    ECHOCARDIOGRAM REPORT   Patient Name:   Roberta Ross Date of Exam: 02/16/2023 Medical Rec #:  [id number]      Height:       65.0 in Accession #:    [id number]     Weight:       120.0 lb Date of Birth:  07-01-1933     BSA:          1.592 m Patient Age:    89 years       BP:           107/96 mmHg Patient Gender: F              HR:           97 bpm. Exam Location:  [facility] Procedure: 2D Echo Indications:     Atrial Fibrillation I48.91  History:         Patient has no prior history of Echocardiogram examinations.  Sonographer:     Overton Mam RDCS, FASE Referring Phys:  [id number] Emeline General Diagnosing Phys: Clotilde Dieter  Sonographer Comments: Technically challenging study due to limited acoustic windows, suboptimal subcostal window and suboptimal apical window. Image acquisition challenging due to respiratory motion and Image acquisition challenging due to uncooperative patient. Unable to complete study due to patient coughing and in pain. Both the patient and daughter requested the test be stopped due to being uncomfortable. IMPRESSIONS  1. Left ventricular ejection fraction, by estimation, is 25 to 30%. The left ventricle has severely decreased function. The left ventricle demonstrates global hypokinesis. Left ventricular diastolic parameters are indeterminate.  2. Right ventricular systolic function is moderately reduced. The right ventricular size is moderately enlarged.  3. Left atrial size was severely dilated.  4. Right atrial size was severely dilated.  5. The mitral valve is grossly normal. Mild mitral valve regurgitation.  6. The aortic valve is grossly normal. Aortic valve regurgitation is trivial. No aortic stenosis is present. FINDINGS  Left Ventricle: Left ventricular ejection fraction, by estimation, is 25 to 30%. The left ventricle has severely decreased function. The left ventricle demonstrates  global hypokinesis. The left ventricular internal cavity size was normal in size. There is no left ventricular hypertrophy. Left ventricular diastolic parameters are indeterminate. Right Ventricle: The right ventricular size is moderately enlarged. No increase in right ventricular wall thickness. Right ventricular systolic function is moderately reduced. Left Atrium: Left atrial size was severely dilated. Right Atrium: Right atrial size was severely dilated. Pericardium: There is no evidence of pericardial effusion. Mitral Valve: The mitral valve is grossly normal. Mild mitral valve regurgitation. Tricuspid Valve: The tricuspid valve is grossly normal. Tricuspid valve regurgitation is mild. Aortic Valve: The aortic valve is grossly normal. Aortic valve regurgitation is trivial. No aortic stenosis is present. Pulmonic Valve: The pulmonic valve was grossly normal. Pulmonic valve regurgitation is trivial. Aorta: The aortic root was not well visualized. IAS/Shunts: No atrial level shunt detected by color flow Doppler.  LEFT VENTRICLE PLAX 2D LVIDd:         4.00 cm LVIDs:         2.80 cm LV PW:         1.20 cm LV IVS:        1.10 cm LVOT diam:  Progress Note    Laurali Kurlander  ZOX:[id number] DOB: 16-Oct-1933  DOA: 02/15/2023 PCP: Lauro Regulus, MD      Brief Narrative:    Medical records reviewed and are as summarized below:  Gilberta Sobiech is a 89 y.o. female  with medical history significant of chronic HFrEF with LVEF 30-35%, HTN, RA with severe finger deformity, HLD, CKD stage IIIb, PAF not on anticoagulation due to history of SAH, who was brought from the nursing home for evaluation of productive cough and worsening shortness of breath.  Reportedly, she also had low-grade fever at the nursing home.  She was started on diagnosed with COVID-19 infection at the nursing home on 02/07/2023 and had completed 5 days of Paxlovid.  Despite finishing Paxlovid, she still continued to have productive cough, exertional shortness of breath and low-grade fever.   She was admitted to the hospital for acute on chronic systolic CHF.   Assessment/Plan:   Principal Problem:   COVID Active Problems:   Acute on chronic systolic CHF (congestive heart failure) (HCC)   Atrial fibrillation, chronic (HCC)   COVID-19 virus infection   Body mass index is 19.97 kg/m.   Acute on chronic systolic CHF: She was taking torsemide at the nursing home.  Change oral Lasix to torsemide. 2D echo in April 2021 showed EF estimated at 30%.   Aortic atherosclerosis atrial fibrillation with RVR: Continue Cardizem.  She is not on anticoagulation because of her history of fall and intracranial bleed. Continue aspirin   Questionable left atrial mass: 2D echo is pending.  She could not have echo today because of persistent cough and pleuritic chest pain. Try to obtain 2D echo tomorrow.   COVID-19 infection: Completed 5 days of Paxlovid prior to admission.  Antitussives as needed.  Analgesics as needed for pleuritic chest pain.  No acute pulmonary embolism or pneumonia on CTA chest. Discontinue azithromycin   Thyroid nodule (1.1 x 1.7 cm):  Outpatient follow-up with PCP   COPD: Continue bronchodilators as needed   CKD stage IIIa: Creatinine is stable   General Weakness: PT evaluation.  Patient will likely go back to SNF.  Diet Order             Diet Heart Room service appropriate? Yes; Fluid consistency: Thin  Diet effective now                            Consultants: None  Procedures: None    Medications:    aspirin EC  81 mg Oral Daily   diltiazem  240 mg Oral Daily   furosemide  40 mg Oral Daily   guaiFENesin  600 mg Oral BID   heparin  5,000 Units Subcutaneous Q12H   senna-docusate  2 tablet Oral BID   simvastatin  10 mg Oral QPM   Continuous Infusions:   Anti-infectives (From admission, onward)    Start     Dose/Rate Route Frequency Ordered Stop   02/15/23 1330  azithromycin (ZITHROMAX) tablet 500 mg  Status:  Discontinued        500 mg Oral Daily 02/15/23 1317 02/16/23 1631   02/15/23 1245  nirmatrelvir/ritonavir (renal dosing) (PAXLOVID) 2 tablet  Status:  Discontinued        2 tablet Oral 2 times daily 02/15/23 1235 02/15/23 1303              Family Communication/Anticipated D/C date and plan/Code Status   DVT prophylaxis: heparin  Progress Note    Laurali Kurlander  ZOX:[id number] DOB: 16-Oct-1933  DOA: 02/15/2023 PCP: Lauro Regulus, MD      Brief Narrative:    Medical records reviewed and are as summarized below:  Gilberta Sobiech is a 89 y.o. female  with medical history significant of chronic HFrEF with LVEF 30-35%, HTN, RA with severe finger deformity, HLD, CKD stage IIIb, PAF not on anticoagulation due to history of SAH, who was brought from the nursing home for evaluation of productive cough and worsening shortness of breath.  Reportedly, she also had low-grade fever at the nursing home.  She was started on diagnosed with COVID-19 infection at the nursing home on 02/07/2023 and had completed 5 days of Paxlovid.  Despite finishing Paxlovid, she still continued to have productive cough, exertional shortness of breath and low-grade fever.   She was admitted to the hospital for acute on chronic systolic CHF.   Assessment/Plan:   Principal Problem:   COVID Active Problems:   Acute on chronic systolic CHF (congestive heart failure) (HCC)   Atrial fibrillation, chronic (HCC)   COVID-19 virus infection   Body mass index is 19.97 kg/m.   Acute on chronic systolic CHF: She was taking torsemide at the nursing home.  Change oral Lasix to torsemide. 2D echo in April 2021 showed EF estimated at 30%.   Aortic atherosclerosis atrial fibrillation with RVR: Continue Cardizem.  She is not on anticoagulation because of her history of fall and intracranial bleed. Continue aspirin   Questionable left atrial mass: 2D echo is pending.  She could not have echo today because of persistent cough and pleuritic chest pain. Try to obtain 2D echo tomorrow.   COVID-19 infection: Completed 5 days of Paxlovid prior to admission.  Antitussives as needed.  Analgesics as needed for pleuritic chest pain.  No acute pulmonary embolism or pneumonia on CTA chest. Discontinue azithromycin   Thyroid nodule (1.1 x 1.7 cm):  Outpatient follow-up with PCP   COPD: Continue bronchodilators as needed   CKD stage IIIa: Creatinine is stable   General Weakness: PT evaluation.  Patient will likely go back to SNF.  Diet Order             Diet Heart Room service appropriate? Yes; Fluid consistency: Thin  Diet effective now                            Consultants: None  Procedures: None    Medications:    aspirin EC  81 mg Oral Daily   diltiazem  240 mg Oral Daily   furosemide  40 mg Oral Daily   guaiFENesin  600 mg Oral BID   heparin  5,000 Units Subcutaneous Q12H   senna-docusate  2 tablet Oral BID   simvastatin  10 mg Oral QPM   Continuous Infusions:   Anti-infectives (From admission, onward)    Start     Dose/Rate Route Frequency Ordered Stop   02/15/23 1330  azithromycin (ZITHROMAX) tablet 500 mg  Status:  Discontinued        500 mg Oral Daily 02/15/23 1317 02/16/23 1631   02/15/23 1245  nirmatrelvir/ritonavir (renal dosing) (PAXLOVID) 2 tablet  Status:  Discontinued        2 tablet Oral 2 times daily 02/15/23 1235 02/15/23 1303              Family Communication/Anticipated D/C date and plan/Code Status   DVT prophylaxis: heparin  [address] [place], [place] [address]    Culture PENDING  Incomplete   Report Status PENDING  Incomplete     Procedures and diagnostic studies:  ECHOCARDIOGRAM COMPLETE  Result Date: 02/16/2023    ECHOCARDIOGRAM REPORT   Patient Name:   Roberta Ross Date of Exam: 02/16/2023 Medical Rec #:  [id number]      Height:       65.0 in Accession #:    [id number]     Weight:       120.0 lb Date of Birth:  07-01-1933     BSA:          1.592 m Patient Age:    89 years       BP:           107/96 mmHg Patient Gender: F              HR:           97 bpm. Exam Location:  [facility] Procedure: 2D Echo Indications:     Atrial Fibrillation I48.91  History:         Patient has no prior history of Echocardiogram examinations.  Sonographer:     Overton Mam RDCS, FASE Referring Phys:  [id number] Emeline General Diagnosing Phys: Clotilde Dieter  Sonographer Comments: Technically challenging study due to limited acoustic windows, suboptimal subcostal window and suboptimal apical window. Image acquisition challenging due to respiratory motion and Image acquisition challenging due to uncooperative patient. Unable to complete study due to patient coughing and in pain. Both the patient and daughter requested the test be stopped due to being uncomfortable. IMPRESSIONS  1. Left ventricular ejection fraction, by estimation, is 25 to 30%. The left ventricle has severely decreased function. The left ventricle demonstrates global hypokinesis. Left ventricular diastolic parameters are indeterminate.  2. Right ventricular systolic function is moderately reduced. The right ventricular size is moderately enlarged.  3. Left atrial size was severely dilated.  4. Right atrial size was severely dilated.  5. The mitral valve is grossly normal. Mild mitral valve regurgitation.  6. The aortic valve is grossly normal. Aortic valve regurgitation is trivial. No aortic stenosis is present. FINDINGS  Left Ventricle: Left ventricular ejection fraction, by estimation, is 25 to 30%. The left ventricle has severely decreased function. The left ventricle demonstrates  global hypokinesis. The left ventricular internal cavity size was normal in size. There is no left ventricular hypertrophy. Left ventricular diastolic parameters are indeterminate. Right Ventricle: The right ventricular size is moderately enlarged. No increase in right ventricular wall thickness. Right ventricular systolic function is moderately reduced. Left Atrium: Left atrial size was severely dilated. Right Atrium: Right atrial size was severely dilated. Pericardium: There is no evidence of pericardial effusion. Mitral Valve: The mitral valve is grossly normal. Mild mitral valve regurgitation. Tricuspid Valve: The tricuspid valve is grossly normal. Tricuspid valve regurgitation is mild. Aortic Valve: The aortic valve is grossly normal. Aortic valve regurgitation is trivial. No aortic stenosis is present. Pulmonic Valve: The pulmonic valve was grossly normal. Pulmonic valve regurgitation is trivial. Aorta: The aortic root was not well visualized. IAS/Shunts: No atrial level shunt detected by color flow Doppler.  LEFT VENTRICLE PLAX 2D LVIDd:         4.00 cm LVIDs:         2.80 cm LV PW:         1.20 cm LV IVS:        1.10 cm LVOT diam:  [address] [place], [place] [address]    Culture PENDING  Incomplete   Report Status PENDING  Incomplete     Procedures and diagnostic studies:  ECHOCARDIOGRAM COMPLETE  Result Date: 02/16/2023    ECHOCARDIOGRAM REPORT   Patient Name:   Roberta Ross Date of Exam: 02/16/2023 Medical Rec #:  [id number]      Height:       65.0 in Accession #:    [id number]     Weight:       120.0 lb Date of Birth:  07-01-1933     BSA:          1.592 m Patient Age:    89 years       BP:           107/96 mmHg Patient Gender: F              HR:           97 bpm. Exam Location:  [facility] Procedure: 2D Echo Indications:     Atrial Fibrillation I48.91  History:         Patient has no prior history of Echocardiogram examinations.  Sonographer:     Overton Mam RDCS, FASE Referring Phys:  [id number] Emeline General Diagnosing Phys: Clotilde Dieter  Sonographer Comments: Technically challenging study due to limited acoustic windows, suboptimal subcostal window and suboptimal apical window. Image acquisition challenging due to respiratory motion and Image acquisition challenging due to uncooperative patient. Unable to complete study due to patient coughing and in pain. Both the patient and daughter requested the test be stopped due to being uncomfortable. IMPRESSIONS  1. Left ventricular ejection fraction, by estimation, is 25 to 30%. The left ventricle has severely decreased function. The left ventricle demonstrates global hypokinesis. Left ventricular diastolic parameters are indeterminate.  2. Right ventricular systolic function is moderately reduced. The right ventricular size is moderately enlarged.  3. Left atrial size was severely dilated.  4. Right atrial size was severely dilated.  5. The mitral valve is grossly normal. Mild mitral valve regurgitation.  6. The aortic valve is grossly normal. Aortic valve regurgitation is trivial. No aortic stenosis is present. FINDINGS  Left Ventricle: Left ventricular ejection fraction, by estimation, is 25 to 30%. The left ventricle has severely decreased function. The left ventricle demonstrates  global hypokinesis. The left ventricular internal cavity size was normal in size. There is no left ventricular hypertrophy. Left ventricular diastolic parameters are indeterminate. Right Ventricle: The right ventricular size is moderately enlarged. No increase in right ventricular wall thickness. Right ventricular systolic function is moderately reduced. Left Atrium: Left atrial size was severely dilated. Right Atrium: Right atrial size was severely dilated. Pericardium: There is no evidence of pericardial effusion. Mitral Valve: The mitral valve is grossly normal. Mild mitral valve regurgitation. Tricuspid Valve: The tricuspid valve is grossly normal. Tricuspid valve regurgitation is mild. Aortic Valve: The aortic valve is grossly normal. Aortic valve regurgitation is trivial. No aortic stenosis is present. Pulmonic Valve: The pulmonic valve was grossly normal. Pulmonic valve regurgitation is trivial. Aorta: The aortic root was not well visualized. IAS/Shunts: No atrial level shunt detected by color flow Doppler.  LEFT VENTRICLE PLAX 2D LVIDd:         4.00 cm LVIDs:         2.80 cm LV PW:         1.20 cm LV IVS:        1.10 cm LVOT diam:

## 2023-02-16 NOTE — Evaluation (Signed)
Physical Therapy Evaluation Patient Details Name: Roberta Ross MRN: [id number] DOB: [birth year] Today's Date: 02/16/2023  History of Present Illness  89 y.o. female with medical history significant of chronic HFrEF with LVEF 30-35%, HTN, RA with severe finger deformity, HLD, CKD stage IIIb, PAF not on anticoagulation due to history of SAH sent from nursing home for evaluation of worsening of cough shortness of breath.     Found to be Covid +  Clinical Impression  Limited eval, pt feeling very poorly and repeatedly refuses to try any mobility despite much encouragement and explanation.  She did, hesitantly, agree to a few light LE exercises in bed but was weak, feeling ill, hypersensitive to essentially any palpation and overall did not tolerate much well.  Her SpO2 remain in the mid 90s t/o the effort and she did not have any SOB, but again just completely fatigued and feeling poorly.  Pt will benefit from continued PT to address functional limitation, will need out of bed assessment as her condition improves and she is better able to participate.      If plan is discharge home, recommend the following: Two people to help with walking and/or transfers;Two people to help with bathing/dressing/bathroom;Assistance with cooking/housework;Assist for transportation   Can travel by private vehicle   No    Equipment Recommendations None recommended by PT  Recommendations for Other Services       Functional Status Assessment Patient has had a recent decline in their functional status and demonstrates the ability to make significant improvements in function in a reasonable and predictable amount of time.     Precautions / Restrictions Precautions Precautions: Fall Precaution Comments: airborne iso Restrictions Weight Bearing Restrictions: No      Mobility  Bed Mobility               General bed mobility comments: pt refuses any attempts at mobility despite much encouragement -  cites too much pain and just feeling too poorly to even try    Transfers                        Ambulation/Gait                  Stairs            Wheelchair Mobility     Tilt Bed    Modified Rankin (Stroke Patients Only)       Balance                                             Pertinent Vitals/Pain Pain Assessment Pain Assessment: 0-10 Pain Score: 9  Pain Location: b/l LEs, b/l hands, hypersensitive to even gentle palpation    Home Living Family/patient expects to be discharged to:: Skilled nursing facility                        Prior Function Prior Level of Function : Needs assist             Mobility Comments: pt is a SNF resident, but able to ambulate with walker ad lib around the facility until this last week ADLs Comments: facility does meals, etc, but apparently until last week using the bathroom independently, etc     Extremity/Trunk Assessment   Upper Extremity Assessment Upper Extremity Assessment: Generalized weakness (servere  RA finger deformities)    Lower Extremity Assessment Lower Extremity Assessment: Generalized weakness (limited tolerance, AROM available but limited t/o LEs)       Communication   Communication Communication: No apparent difficulties  Cognition Arousal: Alert Behavior During Therapy: Anxious Overall Cognitive Status: Within Functional Limits for tasks assessed                                          General Comments General comments (skin integrity, edema, etc.): Pt not interested in doing much activity at all, she reports hurting too much and being too weak to try    Exercises General Exercises - Lower Extremity Ankle Circles/Pumps: AAROM, PROM, 5 reps Heel Slides: AAROM, 5 reps (lightly resisted leg ext) Hip ABduction/ADduction: AAROM, PROM, 5 reps Straight Leg Raises: AAROM, PROM, 5 reps   Assessment/Plan    PT Assessment Patient  needs continued PT services  PT Problem List Decreased strength;Decreased range of motion;Decreased activity tolerance;Decreased balance;Decreased mobility;Decreased knowledge of use of DME;Decreased safety awareness;Pain       PT Treatment Interventions DME instruction;Gait training;Functional mobility training;Therapeutic activities;Therapeutic exercise;Patient/family education    PT Goals (Current goals can be found in the Care Plan section)  Acute Rehab PT Goals Patient Stated Goal: get feeling better PT Goal Formulation: With patient Time For Goal Achievement: 03/01/23 Potential to Achieve Goals: Fair    Frequency Min 1X/week     Co-evaluation               AM-PAC PT "6 Clicks" Mobility  Outcome Measure Help needed turning from your back to your side while in a flat bed without using bedrails?: Total Help needed moving from lying on your back to sitting on the side of a flat bed without using bedrails?: Total Help needed moving to and from a bed to a chair (including a wheelchair)?: Total Help needed standing up from a chair using your arms (e.g., wheelchair or bedside chair)?: Total Help needed to walk in hospital room?: Total Help needed climbing 3-5 steps with a railing? : Total 6 Click Score: 6    End of Session   Activity Tolerance: Patient limited by fatigue;Patient limited by pain Patient left: with bed alarm set;with call bell/phone within reach Nurse Communication: Mobility status PT Visit Diagnosis: Muscle weakness (generalized) (M62.81);Difficulty in walking, not elsewhere classified (R26.2);Pain Pain - Right/Left: Right (& L) Pain - part of body: Leg    Time: 3295-1884 PT Time Calculation (min) (ACUTE ONLY): 17 min   Charges:   PT Evaluation $PT Eval Low Complexity: 1 Low   PT General Charges $$ ACUTE PT VISIT: 1 Visit         Malachi Pro, DPT 02/16/2023, 4:44 PM

## 2023-02-16 NOTE — TOC CM/SW Note (Signed)
Patient on airborne isolation. Called into room to discuss MOON letter and SNF recommendation, NT answered room phone. Patient states she does not feel like talking. Offered to call daughter, patient refuses CSW calling her daughter today. Will reattempt tomorrow.   Alfonso Ramus, LCSW Transitions of Care Department [phone]

## 2023-02-16 NOTE — Plan of Care (Signed)

## 2023-02-16 NOTE — Progress Notes (Signed)
  Echocardiogram 2D Echocardiogram has been performed. Very difficult study to complete due to coughing and patient in pain. The patient and daughter requested test to be stopped.  Lenor Coffin 02/16/2023, 11:04 AM

## 2023-02-16 NOTE — Care Management Obs Status (Signed)
MEDICARE OBSERVATION STATUS NOTIFICATION   Patient Details  Name: Roberta Ross MRN: [id number] Date of Birth: [birth year]   Medicare Observation Status Notification Given:  Yes  Patient on isolation. Patient refuses to talk or have CSW call daughter. MOON provided for NT/RN to deliver to bedside.   Jaquel Glassburn E Gara Kincade, LCSW 02/16/2023, 5:12 PM

## 2023-02-17 DIAGNOSIS — U071 COVID-19: Secondary | ICD-10-CM | POA: Diagnosis not present

## 2023-02-17 MED ORDER — TORSEMIDE 20 MG PO TABS
60.0000 mg | ORAL_TABLET | Freq: Every day | ORAL | Status: DC
  Administered 2023-02-18: 60 mg via ORAL
  Filled 2023-02-17: qty 3

## 2023-02-17 NOTE — TOC Progression Note (Signed)
Transition of Care [facility]) - Progression Note    Patient Details  Name: Roberta Ross MRN: [id number] Date of Birth: 1933-07-09  Transition of [facility]) CM/SW Contact  Liliana Cline, LCSW Phone Number: 02/17/2023, 9:16 AM  Clinical Narrative:    Attempted call into patient's room to discuss SNF. No answer. Requested RN and NT request patient call CSW back, provided direct #.        Expected Discharge Plan and Services                                               Social Determinants of Health (SDOH) Interventions SDOH Screenings   Food Insecurity: No Food Insecurity (02/15/2023)  Housing: Low Risk  (02/15/2023)  Transportation Needs: No Transportation Needs (02/15/2023)  Utilities: Not At Risk (02/15/2023)  Tobacco Use: Medium Risk (11/21/2022)   Received from [facility]    Readmission Risk Interventions     No data to display

## 2023-02-17 NOTE — Progress Notes (Signed)
[address], [place], [place] [address]    Report Status PENDING  Incomplete    Procedures  and diagnostic studies:  ECHOCARDIOGRAM COMPLETE  Result Date: 02/16/2023    ECHOCARDIOGRAM REPORT   Patient Name:   Roberta Ross Date of Exam: 02/16/2023 Medical Rec #:  [id number]      Height:       65.0 in Accession #:    [id number]     Weight:       120.0 lb Date of Birth:  [birth year]     BSA:          1.592 m Patient Age:    89 years       BP:           107/96 mmHg Patient Gender: F              HR:           97 bpm. Exam Location:  [facility] Procedure: 2D Echo Indications:     Atrial Fibrillation I48.91  History:         Patient has no prior history of Echocardiogram examinations.  Sonographer:     Overton Mam RDCS, FASE Referring Phys:  [id number] Emeline General Diagnosing Phys: Clotilde Dieter  Sonographer Comments: Technically challenging study due to limited acoustic windows, suboptimal subcostal window and suboptimal apical window. Image acquisition challenging due to respiratory motion and Image acquisition challenging due to uncooperative patient. Unable to complete study due to patient coughing and in pain. Both the patient and daughter requested the test be stopped due to being uncomfortable. IMPRESSIONS  1. Left ventricular ejection fraction, by estimation, is 25 to 30%. The left ventricle has severely decreased function. The left ventricle demonstrates global hypokinesis. Left ventricular diastolic parameters are indeterminate.  2. Right ventricular systolic function is moderately reduced. The right ventricular size is moderately enlarged.  3. Left atrial size was severely dilated.  4. Right atrial size was severely dilated.  5. The mitral valve is grossly normal. Mild mitral valve regurgitation.  6. The aortic valve is grossly normal. Aortic valve regurgitation is trivial. No aortic stenosis is present. FINDINGS  Left Ventricle: Left ventricular ejection fraction, by estimation, is 25 to 30%. The left ventricle has severely decreased function. The left ventricle demonstrates global hypokinesis.  The left ventricular internal cavity size was normal in size. There is no left ventricular hypertrophy. Left ventricular diastolic parameters are indeterminate. Right Ventricle: The right ventricular size is moderately enlarged. No increase in right ventricular wall thickness. Right ventricular systolic function is moderately reduced. Left Atrium: Left atrial size was severely dilated. Right Atrium: Right atrial size was severely dilated. Pericardium: There is no evidence of pericardial effusion. Mitral Valve: The mitral valve is grossly normal. Mild mitral valve regurgitation. Tricuspid Valve: The tricuspid valve is grossly normal. Tricuspid valve regurgitation is mild. Aortic Valve: The aortic valve is grossly normal. Aortic valve regurgitation is trivial. No aortic stenosis is present. Pulmonic Valve: The pulmonic valve was grossly normal. Pulmonic valve regurgitation is trivial. Aorta: The aortic root was not well visualized. IAS/Shunts: No atrial level shunt detected by color flow Doppler.  LEFT VENTRICLE PLAX 2D LVIDd:         4.00 cm LVIDs:         2.80 cm LV PW:         1.20 cm LV IVS:        1.10 cm LVOT diam:     2.00 cm LVOT Area:  Progress Note    Lyndley Juvera  QIO:[id number] DOB: 08-16-1933  DOA: 02/15/2023 PCP: Lauro Regulus, MD      Brief Narrative:    Medical records reviewed and are as summarized below:  Roberta Ross is a 89 y.o. female  with medical history significant of chronic HFrEF with LVEF 30-35%, HTN, RA with severe finger deformity, HLD, CKD stage IIIb, PAF not on anticoagulation due to history of SAH, who was brought from the nursing home for evaluation of productive cough and worsening shortness of breath.  Reportedly, she also had low-grade fever at the nursing home.  She was started on diagnosed with COVID-19 infection at the nursing home on 02/07/2023 and had completed 5 days of Paxlovid.  Despite finishing Paxlovid, she still continued to have productive cough, exertional shortness of breath and low-grade fever.   She was admitted to the hospital for acute on chronic systolic CHF.   Assessment/Plan:   Principal Problem:   COVID Active Problems:   Acute on chronic systolic CHF (congestive heart failure) (HCC)   Atrial fibrillation, chronic (HCC)   COVID-19 virus infection   Body mass index is 19.97 kg/m.   Acute on chronic systolic CHF: She was taking torsemide at the nursing home.  Change Lasix 40 mg p.o. daily to torsemide 60 mg daily (home dose). 2D echo on 10/19 showed EF estimated 25 to 30%, severely dilated left and right atria, moderately reduced RV function. No left atrial mass on 2D echo. 2D echo in April 2021 showed EF estimated at 30%.   Aortic atherosclerosis atrial fibrillation with RVR: Continue Cardizem.  She is not on anticoagulation because of history of fall and intracranial bleed. Continue aspirin   COVID-19 infection: Completed 5 days of Paxlovid prior to admission.  Antitussives as needed.  Analgesics as needed for pleuritic chest pain.  No acute pulmonary embolism or pneumonia on CTA chest. Azithromycin was discontinued on  02/16/2023   Thyroid nodule (1.1 x 1.7 cm): Outpatient follow-up with PCP   COPD: Continue bronchodilators as needed   CKD stage IIIa: Creatinine is stable   General Weakness: PT recommended discharge to SNF.    Diet Order             Diet Heart Room service appropriate? Yes; Fluid consistency: Thin  Diet effective now                            Consultants: None  Procedures: None    Medications:    aspirin EC  81 mg Oral Daily   diltiazem  240 mg Oral Daily   furosemide  40 mg Oral Daily   guaiFENesin  600 mg Oral BID   heparin  5,000 Units Subcutaneous Q12H   senna-docusate  2 tablet Oral BID   simvastatin  10 mg Oral QPM   Continuous Infusions:   Anti-infectives (From admission, onward)    Start     Dose/Rate Route Frequency Ordered Stop   02/15/23 1330  azithromycin (ZITHROMAX) tablet 500 mg  Status:  Discontinued        500 mg Oral Daily 02/15/23 1317 02/16/23 1631   02/15/23 1245  nirmatrelvir/ritonavir (renal dosing) (PAXLOVID) 2 tablet  Status:  Discontinued        2 tablet Oral 2 times daily 02/15/23 1235 02/15/23 1303              Family Communication/Anticipated D/C date and plan/Code Status  [address], [place], [place] [address]    Report Status PENDING  Incomplete    Procedures  and diagnostic studies:  ECHOCARDIOGRAM COMPLETE  Result Date: 02/16/2023    ECHOCARDIOGRAM REPORT   Patient Name:   Roberta Ross Date of Exam: 02/16/2023 Medical Rec #:  [id number]      Height:       65.0 in Accession #:    [id number]     Weight:       120.0 lb Date of Birth:  [birth year]     BSA:          1.592 m Patient Age:    89 years       BP:           107/96 mmHg Patient Gender: F              HR:           97 bpm. Exam Location:  [facility] Procedure: 2D Echo Indications:     Atrial Fibrillation I48.91  History:         Patient has no prior history of Echocardiogram examinations.  Sonographer:     Overton Mam RDCS, FASE Referring Phys:  [id number] Emeline General Diagnosing Phys: Clotilde Dieter  Sonographer Comments: Technically challenging study due to limited acoustic windows, suboptimal subcostal window and suboptimal apical window. Image acquisition challenging due to respiratory motion and Image acquisition challenging due to uncooperative patient. Unable to complete study due to patient coughing and in pain. Both the patient and daughter requested the test be stopped due to being uncomfortable. IMPRESSIONS  1. Left ventricular ejection fraction, by estimation, is 25 to 30%. The left ventricle has severely decreased function. The left ventricle demonstrates global hypokinesis. Left ventricular diastolic parameters are indeterminate.  2. Right ventricular systolic function is moderately reduced. The right ventricular size is moderately enlarged.  3. Left atrial size was severely dilated.  4. Right atrial size was severely dilated.  5. The mitral valve is grossly normal. Mild mitral valve regurgitation.  6. The aortic valve is grossly normal. Aortic valve regurgitation is trivial. No aortic stenosis is present. FINDINGS  Left Ventricle: Left ventricular ejection fraction, by estimation, is 25 to 30%. The left ventricle has severely decreased function. The left ventricle demonstrates global hypokinesis.  The left ventricular internal cavity size was normal in size. There is no left ventricular hypertrophy. Left ventricular diastolic parameters are indeterminate. Right Ventricle: The right ventricular size is moderately enlarged. No increase in right ventricular wall thickness. Right ventricular systolic function is moderately reduced. Left Atrium: Left atrial size was severely dilated. Right Atrium: Right atrial size was severely dilated. Pericardium: There is no evidence of pericardial effusion. Mitral Valve: The mitral valve is grossly normal. Mild mitral valve regurgitation. Tricuspid Valve: The tricuspid valve is grossly normal. Tricuspid valve regurgitation is mild. Aortic Valve: The aortic valve is grossly normal. Aortic valve regurgitation is trivial. No aortic stenosis is present. Pulmonic Valve: The pulmonic valve was grossly normal. Pulmonic valve regurgitation is trivial. Aorta: The aortic root was not well visualized. IAS/Shunts: No atrial level shunt detected by color flow Doppler.  LEFT VENTRICLE PLAX 2D LVIDd:         4.00 cm LVIDs:         2.80 cm LV PW:         1.20 cm LV IVS:        1.10 cm LVOT diam:     2.00 cm LVOT Area:  Progress Note    Lyndley Juvera  QIO:[id number] DOB: 08-16-1933  DOA: 02/15/2023 PCP: Lauro Regulus, MD      Brief Narrative:    Medical records reviewed and are as summarized below:  Roberta Ross is a 89 y.o. female  with medical history significant of chronic HFrEF with LVEF 30-35%, HTN, RA with severe finger deformity, HLD, CKD stage IIIb, PAF not on anticoagulation due to history of SAH, who was brought from the nursing home for evaluation of productive cough and worsening shortness of breath.  Reportedly, she also had low-grade fever at the nursing home.  She was started on diagnosed with COVID-19 infection at the nursing home on 02/07/2023 and had completed 5 days of Paxlovid.  Despite finishing Paxlovid, she still continued to have productive cough, exertional shortness of breath and low-grade fever.   She was admitted to the hospital for acute on chronic systolic CHF.   Assessment/Plan:   Principal Problem:   COVID Active Problems:   Acute on chronic systolic CHF (congestive heart failure) (HCC)   Atrial fibrillation, chronic (HCC)   COVID-19 virus infection   Body mass index is 19.97 kg/m.   Acute on chronic systolic CHF: She was taking torsemide at the nursing home.  Change Lasix 40 mg p.o. daily to torsemide 60 mg daily (home dose). 2D echo on 10/19 showed EF estimated 25 to 30%, severely dilated left and right atria, moderately reduced RV function. No left atrial mass on 2D echo. 2D echo in April 2021 showed EF estimated at 30%.   Aortic atherosclerosis atrial fibrillation with RVR: Continue Cardizem.  She is not on anticoagulation because of history of fall and intracranial bleed. Continue aspirin   COVID-19 infection: Completed 5 days of Paxlovid prior to admission.  Antitussives as needed.  Analgesics as needed for pleuritic chest pain.  No acute pulmonary embolism or pneumonia on CTA chest. Azithromycin was discontinued on  02/16/2023   Thyroid nodule (1.1 x 1.7 cm): Outpatient follow-up with PCP   COPD: Continue bronchodilators as needed   CKD stage IIIa: Creatinine is stable   General Weakness: PT recommended discharge to SNF.    Diet Order             Diet Heart Room service appropriate? Yes; Fluid consistency: Thin  Diet effective now                            Consultants: None  Procedures: None    Medications:    aspirin EC  81 mg Oral Daily   diltiazem  240 mg Oral Daily   furosemide  40 mg Oral Daily   guaiFENesin  600 mg Oral BID   heparin  5,000 Units Subcutaneous Q12H   senna-docusate  2 tablet Oral BID   simvastatin  10 mg Oral QPM   Continuous Infusions:   Anti-infectives (From admission, onward)    Start     Dose/Rate Route Frequency Ordered Stop   02/15/23 1330  azithromycin (ZITHROMAX) tablet 500 mg  Status:  Discontinued        500 mg Oral Daily 02/15/23 1317 02/16/23 1631   02/15/23 1245  nirmatrelvir/ritonavir (renal dosing) (PAXLOVID) 2 tablet  Status:  Discontinued        2 tablet Oral 2 times daily 02/15/23 1235 02/15/23 1303              Family Communication/Anticipated D/C date and plan/Code Status  Progress Note    Lyndley Juvera  QIO:[id number] DOB: 08-16-1933  DOA: 02/15/2023 PCP: Lauro Regulus, MD      Brief Narrative:    Medical records reviewed and are as summarized below:  Roberta Ross is a 89 y.o. female  with medical history significant of chronic HFrEF with LVEF 30-35%, HTN, RA with severe finger deformity, HLD, CKD stage IIIb, PAF not on anticoagulation due to history of SAH, who was brought from the nursing home for evaluation of productive cough and worsening shortness of breath.  Reportedly, she also had low-grade fever at the nursing home.  She was started on diagnosed with COVID-19 infection at the nursing home on 02/07/2023 and had completed 5 days of Paxlovid.  Despite finishing Paxlovid, she still continued to have productive cough, exertional shortness of breath and low-grade fever.   She was admitted to the hospital for acute on chronic systolic CHF.   Assessment/Plan:   Principal Problem:   COVID Active Problems:   Acute on chronic systolic CHF (congestive heart failure) (HCC)   Atrial fibrillation, chronic (HCC)   COVID-19 virus infection   Body mass index is 19.97 kg/m.   Acute on chronic systolic CHF: She was taking torsemide at the nursing home.  Change Lasix 40 mg p.o. daily to torsemide 60 mg daily (home dose). 2D echo on 10/19 showed EF estimated 25 to 30%, severely dilated left and right atria, moderately reduced RV function. No left atrial mass on 2D echo. 2D echo in April 2021 showed EF estimated at 30%.   Aortic atherosclerosis atrial fibrillation with RVR: Continue Cardizem.  She is not on anticoagulation because of history of fall and intracranial bleed. Continue aspirin   COVID-19 infection: Completed 5 days of Paxlovid prior to admission.  Antitussives as needed.  Analgesics as needed for pleuritic chest pain.  No acute pulmonary embolism or pneumonia on CTA chest. Azithromycin was discontinued on  02/16/2023   Thyroid nodule (1.1 x 1.7 cm): Outpatient follow-up with PCP   COPD: Continue bronchodilators as needed   CKD stage IIIa: Creatinine is stable   General Weakness: PT recommended discharge to SNF.    Diet Order             Diet Heart Room service appropriate? Yes; Fluid consistency: Thin  Diet effective now                            Consultants: None  Procedures: None    Medications:    aspirin EC  81 mg Oral Daily   diltiazem  240 mg Oral Daily   furosemide  40 mg Oral Daily   guaiFENesin  600 mg Oral BID   heparin  5,000 Units Subcutaneous Q12H   senna-docusate  2 tablet Oral BID   simvastatin  10 mg Oral QPM   Continuous Infusions:   Anti-infectives (From admission, onward)    Start     Dose/Rate Route Frequency Ordered Stop   02/15/23 1330  azithromycin (ZITHROMAX) tablet 500 mg  Status:  Discontinued        500 mg Oral Daily 02/15/23 1317 02/16/23 1631   02/15/23 1245  nirmatrelvir/ritonavir (renal dosing) (PAXLOVID) 2 tablet  Status:  Discontinued        2 tablet Oral 2 times daily 02/15/23 1235 02/15/23 1303              Family Communication/Anticipated D/C date and plan/Code Status

## 2023-02-18 DIAGNOSIS — U071 COVID-19: Secondary | ICD-10-CM | POA: Diagnosis not present

## 2023-02-18 LAB — MYCOPLASMA PNEUMONIAE ANTIBODY, IGM: Mycoplasma pneumo IgM: 970 U/mL — ABNORMAL HIGH (ref 0–769)

## 2023-02-18 LAB — CULTURE, RESPIRATORY W GRAM STAIN

## 2023-02-18 LAB — T4: T4, Total: 7.2 ug/dL (ref 4.5–12.0)

## 2023-02-18 MED ORDER — TORSEMIDE 20 MG PO TABS: 40.0000 mg | ORAL_TABLET | Freq: Every day | ORAL | Status: DC

## 2023-02-18 MED ORDER — TORSEMIDE 20 MG PO TABS
60.0000 mg | ORAL_TABLET | Freq: Every day | ORAL | Status: DC
  Administered 2023-02-19 – 2023-02-20 (×2): 60 mg via ORAL
  Filled 2023-02-18 (×2): qty 3

## 2023-02-18 MED ORDER — AZITHROMYCIN 500 MG PO TABS
500.0000 mg | ORAL_TABLET | Freq: Once | ORAL | Status: AC
  Administered 2023-02-18: 500 mg via ORAL
  Filled 2023-02-18: qty 1

## 2023-02-18 MED ORDER — CEFDINIR 300 MG PO CAPS
300.0000 mg | ORAL_CAPSULE | Freq: Two times a day (BID) | ORAL | Status: AC
  Administered 2023-02-18 – 2023-02-22 (×10): 300 mg via ORAL
  Filled 2023-02-18 (×10): qty 1

## 2023-02-18 NOTE — [place] FL2 (Cosign Needed Addendum)
[place] MEDICAID [id number] LEVEL OF CARE FORM     IDENTIFICATION  Patient Name: Roberta Ross Birthdate: 1933-08-22 Sex: female Admission Date (Current Location): 02/15/2023  [facility] and [place] Number:  Chiropodist and Address:  [facility], [address] [place], [place] [address]      Provider Number: [id number]  Attending Physician Name and Address:  Enedina Finner, MD  Relative Name and Phone Number:  [name]  [phone]    Current Level of Care: Hospital Recommended Level of Care: [facility] [place] Prior Approval Number:    Date Approved/Denied:   PASRR Number: [id number]  Discharge Plan: ALF    Current Diagnoses: Patient Active Problem List   Diagnosis Date Noted   COVID-19 virus infection 02/15/2023   COVID 02/15/2023   Rheumatoid arthritis (HCC) 01/24/2022   CAP (community acquired pneumonia) 01/21/2022   Sepsis (HCC) secondary to community-acquired pneumonia and UTI 01/21/2022   Acute on chronic systolic CHF (congestive heart failure) (HCC) 01/21/2022   UTI (urinary tract infection) 01/21/2022   HLD (hyperlipidemia) 01/21/2022   Chronic kidney disease, stage 3b (HCC) 01/21/2022   Sepsis due to pneumonia [facility])    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   Closed fracture of femur, intertrochanteric, left, initial encounter (HCC) 03/27/2020   Accidental fall 03/27/2020   Preoperative clearance 03/27/2020   Closed left hip fracture, initial encounter (HCC) 03/27/2020   Simple chronic bronchitis (HCC) 09/01/2018   Atrial fibrillation, chronic (HCC) 09/16/2017   History of subarachnoid hemorrhage 07/21/2017   Prediabetes 03/21/2017   GAD (generalized anxiety disorder) 02/01/2016   Hypercalcemia 02/01/2016   Essential hypertension 01/04/2015   Stage 3b chronic kidney disease (HCC) 08/24/2014   Pure hypercholesterolemia 02/09/2014   H/O rheumatoid arthritis 10/20/2013    Orientation RESPIRATION BLADDER Height & Weight      Self, Time, Place  Normal Incontinent, External catheter Weight: 120 lb (54.4 kg) Height:  5\' 5"  (165.1 cm)  BEHAVIORAL SYMPTOMS/MOOD NEUROLOGICAL BOWEL NUTRITION STATUS      Continent Diet (see discharge summary)  AMBULATORY STATUS COMMUNICATION OF NEEDS Skin   Extensive Assist Verbally Normal                       Personal Care Assistance Level of Assistance  Bathing, Feeding, Dressing, Total care Bathing Assistance: Maximum assistance Feeding assistance: Limited assistance Dressing Assistance: Maximum assistance Total Care Assistance: Maximum assistance   Functional Limitations Info  Sight, Hearing, Speech Sight Info: Adequate Hearing Info: Adequate Speech Info: Adequate    SPECIAL CARE FACTORS FREQUENCY  PT (By licensed PT), OT (By licensed OT)     PT Frequency: min 4x weekly OT Frequency: min 4x weekly            Contractures Contractures Info: Not present    Additional Factors Info  Code Status, Allergies, Isolation Precautions Code Status Info: DNR limited Allergies Info: Metoprolol  Penicillins  Methotrexate Derivatives  Doxycycline Hyclate     Isolation Precautions Info: covid positive 10/18     Current Medications (02/18/2023):  This is the current hospital active medication list Current Facility-Administered Medications  Medication Dose Route Frequency Provider Last Rate Last Admin   acetaminophen (TYLENOL) tablet 650 mg  650 mg Oral Q6H PRN Mikey College T, MD   650 mg at 02/17/23 1026   Or   acetaminophen (TYLENOL) suppository 650 mg  650 mg Rectal Q6H PRN Emeline General, MD       aspirin EC tablet 81 mg  81 mg Oral Daily Mikey College T, MD   81 mg at 02/18/23 1020   cefdinir (OMNICEF) capsule 300 mg  300 mg Oral Q12H Enedina Finner, MD   300 mg at 02/18/23 1021   chlorpheniramine-HYDROcodone (TUSSIONEX) 10-8 MG/5ML suspension 5 mL  5 mL Oral Q12H PRN Mikey College T, MD   5 mL at 02/17/23 0059   diltiazem (CARDIZEM CD) 24 hr capsule 240 mg  240 mg  Oral Daily Mikey College T, MD   240 mg at 02/18/23 1021   guaiFENesin (MUCINEX) 12 hr tablet 600 mg  600 mg Oral BID Mikey College T, MD   600 mg at 02/18/23 1020   heparin injection 5,000 Units  5,000 Units Subcutaneous Q12H Mikey College T, MD   5,000 Units at 02/18/23 1021   ipratropium (ATROVENT) nebulizer solution 0.5 mg  0.5 mg Nebulization Q6H PRN Mikey College T, MD       labetalol (NORMODYNE) injection 5 mg  5 mg Intravenous Q4H PRN Mikey College T, MD       ondansetron [facility]) tablet 4 mg  4 mg Oral Q6H PRN Mikey College T, MD       Or   ondansetron [facility]) injection 4 mg  4 mg Intravenous Q6H PRN Mikey College T, MD       senna-docusate (Senokot-S) tablet 2 tablet  2 tablet Oral BID Mikey College T, MD   2 tablet at 02/18/23 1021   simvastatin (ZOCOR) tablet 10 mg  10 mg Oral QPM Mikey College T, MD   10 mg at 02/17/23 2140   [START ON 02/19/2023] torsemide (DEMADEX) tablet 60 mg  60 mg Oral Daily Enedina Finner, MD       traMADol Janean Sark) tablet 50 mg  50 mg Oral Q6H PRN Emeline General, MD   50 mg at 02/17/23 1511     Discharge Medications: Please see discharge summary for a list of discharge medications.  Relevant Imaging Results:  Relevant Lab Results:   Additional Information SSN: [id number]  Darolyn Rua, LCSW

## 2023-02-18 NOTE — TOC Progression Note (Signed)
Transition of Care [facility]) - Progression Note    Patient Details  Name: Roberta Ross MRN: [id number] Date of Birth: Oct 15, 1933  Transition of Care [facility]) CM/SW Contact  Garret Reddish, RN Phone Number: 02/18/2023, 9:25 PM  Clinical Narrative:    Chart reviewed.  I have spoken with patient's daughter [name].  She informs me that prior to admission he mother was at [facility] in [place].  She reports that her mother was able to get around the facility with a rolling walker.    [name] reports that she would like for patient to return to [facility] on discharge. [name] reports that patient has received PT at the facility before.    I have spoken with Lavera Guise, Facilities manager, at [facility].  Lewanda Rife informs me [name] Nurse will need to come and complete and assessment on tomorrow prior to accepting back.   Lewanda Rife reports that [facility] staff nurse will come and see patient in the am between 8:30 and 9 am.    Lewanda Rife reports that they do not transport patient back to the facility.  She informs me that patient will need to transport back via EMS.  TOC will continue to follow for discharge planning.        Expected Discharge Plan and Services                                               Social Determinants of Health (SDOH) Interventions SDOH Screenings   Food Insecurity: No Food Insecurity (02/15/2023)  Housing: Low Risk  (02/15/2023)  Transportation Needs: No Transportation Needs (02/15/2023)  Utilities: Not At Risk (02/15/2023)  Tobacco Use: Medium Risk (11/21/2022)   Received from [facility]    Readmission Risk Interventions     No data to display

## 2023-02-18 NOTE — Progress Notes (Signed)
[facility]  - [place] at [facility]   PATIENT NAME: Roberta Ross    MR#:  [id number]  DATE OF BIRTH:  1933-09-24  SUBJECTIVE:  Not the best historian no family at bedside. Patient gets a little irritable when asked questions. She told me she will eat if she feels like it. Some cough. No fever.    VITALS:  Blood pressure 108/71, pulse 84, temperature 98.3 F (36.8 C), temperature source Oral, resp. rate 20, height 5\' 5"  (1.651 m), weight 54.4 kg, SpO2 95%.  PHYSICAL EXAMINATION:   GENERAL:  89 y.o.-year-old patient with no acute distress.  LUNGS: Normal breath sounds bilaterally CARDIOVASCULAR: S1, S2 normal. No murmur   ABDOMEN: Soft, nontender, nondistended.  EXTREMITIES: No  edema b/l.    NEUROLOGIC: nonfocal  patient is alert and awake  LABORATORY PANEL:  CBC Recent Labs  Lab 02/15/23 0813  WBC 8.1  HGB 11.8*  HCT 37.4  PLT 230    Chemistries  Recent Labs  Lab 02/15/23 0813 02/16/23 0543  NA 136 137  K 4.2 3.9  CL 100 106  CO2 25 22  GLUCOSE 102* 98  BUN 17 14  CREATININE 1.15* 1.13*  CALCIUM 9.6 9.3  AST 16  --   ALT 10  --   ALKPHOS 81  --   BILITOT 0.6  --     Assessment and Plan  Roberta Ross is a 89 y.o. female  with medical history significant of chronic HFrEF with LVEF 30-35%, HTN, RA with severe finger deformity, HLD, CKD stage IIIb, PAF not on anticoagulation due to history of SAH, who was brought from the nursing home for evaluation of productive cough and worsening shortness of breath.  Reportedly, she also had low-grade fever at the nursing home.  She was started on diagnosed with COVID-19 infection at the nursing home on 02/07/2023 and had completed 5 days of Paxlovid.  Despite finishing Paxlovid, she still continued to have productive cough, exertional shortness of breath and low-grade fever.   Acute on chronic systolic CHF: She was taking torsemide at the nursing home.  Change oral Lasix to torsemide. 2D echo in  April 2021 showed EF estimated at 30%.    Aortic atherosclerosis atrial fibrillation with RVR: -- Continue Cardizem.   --She is not on anticoagulation because of her history of fall and intracranial bleed. --Continue aspirin    Questionable left atrial mass: 2D echo is pending.  She could not have echo today because of persistent cough and pleuritic chest pain. --2 d echo did not reveal any mass--will defer to PCP If needed repeat at later date    COVID-19 infection (dx 02/07/23) Acute on chronic Bronchitis --sputum cx GNR, GPC--will give 5 days to cefdinir. Finished 3 days of zithromax -- Completed 5 days of Paxlovid prior to admission.  Antitussives as needed.  Analgesics as needed for pleuritic chest pain.  No acute pulmonary embolism or pneumonia on CTA chest.   Thyroid nodule (1.1 x 1.7 cm): Outpatient follow-up with PCP    COPD: Continue bronchodilators as needed --sats >92% on RA    CKD stage IIIa: Creatinine is stable   General Weakness: PT evaluation--rec rehab. Spoke with dter  and would like her to go to [facility]    Family communication :dter [name] CODE STATUS: DNR DVT Prophylaxis : Level of care: Telemetry Medical Status is: Observation The patient remains OBS appropriate and will d/c before 2 midnights. Pt is best at baseline. Awaiting TOC for d/c  planning. Medically at baseline    TOTAL TIME TAKING CARE OF THIS PATIENT: 35 minutes.  >50% time spent on counselling and coordination of care  Note: This dictation was prepared with Dragon dictation along with smaller phrase technology. Any transcriptional errors that result from this process are unintentional.  Roberta Ross M.D    [facility]   CC: Primary care physician; Lauro Regulus, MD

## 2023-02-18 NOTE — TOC Progression Note (Signed)
Transition of Care [facility]) - Progression Note    Patient Details  Name: Roberta Ross MRN: [id number] Date of Birth: 10/07/1933  [facility] CM/SW Contact  Harriet Masson, RN Phone Number: 02/18/2023, 4:08 PM  Clinical Narrative:    Left VM with [name] [phone],, patient's daughter, regarding discharge plan.  Requested return call.         Expected Discharge Plan and Services                                               Social Determinants of Health (SDOH) Interventions SDOH Screenings   Food Insecurity: No Food Insecurity (02/15/2023)  Housing: Low Risk  (02/15/2023)  Transportation Needs: No Transportation Needs (02/15/2023)  Utilities: Not At Risk (02/15/2023)  Tobacco Use: Medium Risk (11/21/2022)   Received from [facility]    Readmission Risk Interventions     No data to display

## 2023-02-19 DIAGNOSIS — U071 COVID-19: Secondary | ICD-10-CM | POA: Diagnosis not present

## 2023-02-19 NOTE — NC FL2 (Signed)
[place] MEDICAID FL2 LEVEL OF CARE FORM     IDENTIFICATION  Patient Name: Roberta Ross Birthdate: 02-Apr-1934 Sex: female Admission Date (Current Location): 02/15/2023  [facility] and [place] Number:  Chiropodist and Address:  [facility], [address] [place], [place] [address]      Provider Number: [id number]  Attending Physician Name and Address:  Enedina Finner, MD  Relative Name and Phone Number:  [name]  [phone]    Current Level of Care: Hospital Recommended Level of Care: Skilled Nursing Facility Prior Approval Number:    Date Approved/Denied:   PASRR Number: [id number]  Discharge Plan: SNF    Current Diagnoses: Patient Active Problem List   Diagnosis Date Noted   COVID-19 virus infection 02/15/2023   COVID 02/15/2023   Rheumatoid arthritis (HCC) 01/24/2022   CAP (community acquired pneumonia) 01/21/2022   Sepsis (HCC) secondary to community-acquired pneumonia and UTI 01/21/2022   Acute on chronic systolic CHF (congestive heart failure) (HCC) 01/21/2022   UTI (urinary tract infection) 01/21/2022   HLD (hyperlipidemia) 01/21/2022   Chronic kidney disease, stage 3b (HCC) 01/21/2022   Sepsis due to pneumonia [facility])    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   Closed fracture of femur, intertrochanteric, left, initial encounter (HCC) 03/27/2020   Accidental fall 03/27/2020   Preoperative clearance 03/27/2020   Closed left hip fracture, initial encounter (HCC) 03/27/2020   Simple chronic bronchitis (HCC) 09/01/2018   Atrial fibrillation, chronic (HCC) 09/16/2017   History of subarachnoid hemorrhage 07/21/2017   Prediabetes 03/21/2017   GAD (generalized anxiety disorder) 02/01/2016   Hypercalcemia 02/01/2016   Essential hypertension 01/04/2015   Stage 3b chronic kidney disease (HCC) 08/24/2014   Pure hypercholesterolemia 02/09/2014   H/O rheumatoid arthritis 10/20/2013    Orientation RESPIRATION BLADDER Height  & Weight     Self, Time, Place  Normal Incontinent, External catheter Weight: 54.4 kg Height:  5\' 5"  (165.1 cm)  BEHAVIORAL SYMPTOMS/MOOD NEUROLOGICAL BOWEL NUTRITION STATUS      Continent Diet (see discharge summary)  AMBULATORY STATUS COMMUNICATION OF NEEDS Skin   Extensive Assist Verbally Normal                       Personal Care Assistance Level of Assistance  Bathing, Feeding, Dressing, Total care Bathing Assistance: Maximum assistance Feeding assistance: Limited assistance Dressing Assistance: Maximum assistance Total Care Assistance: Maximum assistance   Functional Limitations Info  Sight, Hearing, Speech Sight Info: Adequate Hearing Info: Adequate Speech Info: Adequate    SPECIAL CARE FACTORS FREQUENCY  PT (By licensed PT), OT (By licensed OT)     PT Frequency: 5x weekly OT Frequency: 5x weekly            Contractures Contractures Info: Not present    Additional Factors Info  Code Status, Allergies, Isolation Precautions Code Status Info: DNR limited Allergies Info: Metoprolol  Penicillins  Methotrexate Derivatives  Doxycycline Hyclate     Isolation Precautions Info: covid positive 10/18     Current Medications (02/19/2023):  This is the current hospital active medication list Current Facility-Administered Medications  Medication Dose Route Frequency Provider Last Rate Last Admin   acetaminophen (TYLENOL) tablet 650 mg  650 mg Oral Q6H PRN Mikey College T, MD   650 mg at 02/17/23 1026   Or   acetaminophen (TYLENOL) suppository 650 mg  650 mg Rectal Q6H PRN Emeline General, MD       aspirin EC tablet 81 mg  81 mg  Oral Daily Mikey College T, MD   81 mg at 02/19/23 0946   cefdinir (OMNICEF) capsule 300 mg  300 mg Oral Q12H Enedina Finner, MD   300 mg at 02/19/23 0946   chlorpheniramine-HYDROcodone (TUSSIONEX) 10-8 MG/5ML suspension 5 mL  5 mL Oral Q12H PRN Mikey College T, MD   5 mL at 02/19/23 0945   diltiazem (CARDIZEM CD) 24 hr capsule 240 mg  240 mg Oral  Daily Mikey College T, MD   240 mg at 02/19/23 0946   guaiFENesin (MUCINEX) 12 hr tablet 600 mg  600 mg Oral BID Mikey College T, MD   600 mg at 02/19/23 0946   heparin injection 5,000 Units  5,000 Units Subcutaneous Q12H Mikey College T, MD   5,000 Units at 02/19/23 0947   ipratropium (ATROVENT) nebulizer solution 0.5 mg  0.5 mg Nebulization Q6H PRN Mikey College T, MD       labetalol (NORMODYNE) injection 5 mg  5 mg Intravenous Q4H PRN Mikey College T, MD       ondansetron [facility]) tablet 4 mg  4 mg Oral Q6H PRN Mikey College T, MD       Or   ondansetron [facility]) injection 4 mg  4 mg Intravenous Q6H PRN Mikey College T, MD       senna-docusate (Senokot-S) tablet 2 tablet  2 tablet Oral BID Mikey College T, MD   2 tablet at 02/19/23 0946   simvastatin (ZOCOR) tablet 10 mg  10 mg Oral QPM Mikey College T, MD   10 mg at 02/18/23 2200   torsemide (DEMADEX) tablet 60 mg  60 mg Oral Daily Enedina Finner, MD   60 mg at 02/19/23 0947   traMADol (ULTRAM) tablet 50 mg  50 mg Oral Q6H PRN Emeline General, MD   50 mg at 02/19/23 1914     Discharge Medications: Please see discharge summary for a list of discharge medications.  Relevant Imaging Results:  Relevant Lab Results:   Additional Information SSN: [id number]  Garret Reddish, RN

## 2023-02-19 NOTE — Progress Notes (Signed)
[facility]  - [place] at [facility]   PATIENT NAME: Roberta Ross    MR#:  [id number]  DATE OF BIRTH:  07/02/1933  SUBJECTIVE:  Not the best historian no family at bedside. Remains weak. Did walk about 6 ft with PT today  VITALS:  Blood pressure 99/70, pulse 74, temperature 98.2 F (36.8 C), resp. rate 17, height 5\' 5"  (1.651 m), weight 54.4 kg, SpO2 94%.  PHYSICAL EXAMINATION:   GENERAL:  89 y.o.-year-old patient with no acute distress.  LUNGS: Normal breath sounds bilaterally CARDIOVASCULAR: S1, S2 normal. No murmur   ABDOMEN: Soft, nontender, nondistended.  EXTREMITIES: No  edema b/l.    NEUROLOGIC: nonfocal  patient is alert and awake  LABORATORY PANEL:  CBC Recent Labs  Lab 02/15/23 0813  WBC 8.1  HGB 11.8*  HCT 37.4  PLT 230    Chemistries  Recent Labs  Lab 02/15/23 0813 02/16/23 0543  NA 136 137  K 4.2 3.9  CL 100 106  CO2 25 22  GLUCOSE 102* 98  BUN 17 14  CREATININE 1.15* 1.13*  CALCIUM 9.6 9.3  AST 16  --   ALT 10  --   ALKPHOS 81  --   BILITOT 0.6  --     Assessment and Plan  Roberta Ross is a 89 y.o. female  with medical history significant of chronic HFrEF with LVEF 30-35%, HTN, RA with severe finger deformity, HLD, CKD stage IIIb, PAF not on anticoagulation due to history of SAH, who was brought from the nursing home for evaluation of productive cough and worsening shortness of breath.  Reportedly, she also had low-grade fever at the nursing home.  She was started on diagnosed with COVID-19 infection at the nursing home on 02/07/2023 and had completed 5 days of Paxlovid.  Despite finishing Paxlovid, she still continued to have productive cough, exertional shortness of breath and low-grade fever.   Acute on chronic systolic CHF: She was taking torsemide at the nursing home.  Change oral Lasix to torsemide. 2D echo in April 2021 showed EF estimated at 30%.    Aortic atherosclerosis atrial fibrillation with RVR: --  Continue Cardizem.   --She is not on anticoagulation because of her history of fall and intracranial bleed. --Continue aspirin    Questionable left atrial mass: 2D echo is pending.  She could not have echo today because of persistent cough and pleuritic chest pain. --2 d echo did not reveal any mass--will defer to PCP If needed repeat at later date    COVID-19 infection (dx 02/07/23) Acute on chronic Bronchitis --sputum cx GNR, GPC--will give 5 days to cefdinir. Finished 3 days of zithromax -- Completed 5 days of Paxlovid prior to admission.  Antitussives as needed.  Analgesics as needed for pleuritic chest pain.  No acute pulmonary embolism or pneumonia on CTA chest.   Thyroid nodule (1.1 x 1.7 cm): Outpatient follow-up with PCP    COPD: Continue bronchodilators as needed --sats >92% on RA    CKD stage IIIa: Creatinine is stable   General Weakness: PT evaluation--rec rehab. Spoke with dter  and would like her to go to [facility]  [facility] evaluated her and feels she is far from her baseline. Dter is agreeable for STR. TOC for STR    Family communication :dter [name] CODE STATUS: DNR DVT Prophylaxis : Level of care: Telemetry Medical Status is: Observation The patient remains OBS appropriate and will d/c before 2 midnights. Awaiting TOC for d/c planning. Medically at  baseline    TOTAL TIME TAKING CARE OF THIS PATIENT: 35 minutes.  >50% time spent on counselling and coordination of care  Note: This dictation was prepared with Dragon dictation along with smaller phrase technology. Any transcriptional errors that result from this process are unintentional.  Enedina Finner M.D    [facility]   CC: Primary care physician; Lauro Regulus, MD

## 2023-02-19 NOTE — Progress Notes (Signed)
Physical Therapy Treatment Patient Details Name: Roberta Ross MRN: [id number] DOB: 18-Apr-1934 Today's Date: 02/19/2023   History of Present Illness 89 y.o. female with medical history significant of chronic HFrEF with LVEF 30-35%, HTN, RA with severe finger deformity, HLD, CKD stage IIIb, PAF not on anticoagulation due to history of SAH sent from nursing home for evaluation of worsening of cough shortness of breath.     Found to be Covid +    PT Comments  Pt continues to be functionally very limited as compared to her baseline, but was able to participate with and do much more activity than on the PT eval this past weekend.  Pt was able to initiate movement in the bed and showed AROM with exercises and mobility that simply wasn't there last session.  She was able to stand and ambulate ~80ft with walker, SpO2 remains in the 90s on room air with activity.  Pt making nice gains, will benefit from continued PT per POC.      If plan is discharge home, recommend the following: Assistance with cooking/housework;Assist for transportation;A lot of help with walking and/or transfers;A lot of help with bathing/dressing/bathroom   Can travel by private vehicle     No  Equipment Recommendations  None recommended by PT    Recommendations for Other Services       Precautions / Restrictions Precautions Precautions: Fall Precaution Comments: airborne iso Restrictions Weight Bearing Restrictions: No     Mobility  Bed Mobility Overal bed mobility: Needs Assistance Bed Mobility: Supine to Sit     Supine to sit: Min assist     General bed mobility comments: Pt was able to get her LEs off EOB and elevate trunk most of the way before needing light assist to get fully upright and scoot toward EOB    Transfers Overall transfer level: Needs assistance Equipment used: Rolling walker (2 wheels) Transfers: Sit to/from Stand Sit to Stand: Min assist, Mod assist           General transfer  comment: Pt could not initiate upward motion from standard height bed w/o some direct assist from PT.    Ambulation/Gait Ambulation/Gait assistance: Min assist Gait Distance (Feet): 6 Feet Assistive device: Rolling walker (2 wheels)         General Gait Details: Pt was able to take ~10 small,  slow steps with walker toward foot of bed, requested to sit w/o signs of excess fatigue, pain, unsteadiness, etc   Stairs             Wheelchair Mobility     Tilt Bed    Modified Rankin (Stroke Patients Only)       Balance Overall balance assessment: Needs assistance Sitting-balance support: Bilateral upper extremity supported Sitting balance-Leahy Scale: Good Sitting balance - Comments: once assisted to sitting square with EOB pt was able to maintain  with supervision only     Standing balance-Leahy Scale: Fair Standing balance comment: reliant on walker, poor standing tolerance but no overt LOBs                            Cognition Arousal: Alert Behavior During Therapy: Flat affect Overall Cognitive Status: Within Functional Limits for tasks assessed  Exercises General Exercises - Lower Extremity Ankle Circles/Pumps: AAROM, PROM, 5 reps Quad Sets: Strengthening, 10 reps Short Arc Quad: AROM, 10 reps Heel Slides: AAROM, 5 reps (resisted leg ext) Hip ABduction/ADduction: AAROM, PROM, 5 reps    General Comments General comments (skin integrity, edema, etc.): Pt with much more willingness and ability to participate today than on eval      Pertinent Vitals/Pain Pain Assessment Pain Assessment: No/denies pain Pain Score:  (reports no pain, none of the hypersensitivity that was present on eval)    Home Living                          Prior Function            PT Goals (current goals can now be found in the care plan section) Progress towards PT goals: Progressing toward goals     Frequency    Min 1X/week      PT Plan      Co-evaluation              AM-PAC PT "6 Clicks" Mobility   Outcome Measure  Help needed turning from your back to your side while in a flat bed without using bedrails?: A Little Help needed moving from lying on your back to sitting on the side of a flat bed without using bedrails?: A Little Help needed moving to and from a bed to a chair (including a wheelchair)?: A Lot Help needed standing up from a chair using your arms (e.g., wheelchair or bedside chair)?: A Lot Help needed to walk in hospital room?: A Lot Help needed climbing 3-5 steps with a railing? : Total 6 Click Score: 13    End of Session Equipment Utilized During Treatment: Gait belt Activity Tolerance: Patient limited by fatigue;Patient limited by pain Patient left: with call bell/phone within reach;with chair alarm set Nurse Communication: Mobility status PT Visit Diagnosis: Muscle weakness (generalized) (M62.81);Difficulty in walking, not elsewhere classified (R26.2);Pain Pain - Right/Left: Right (& L) Pain - part of body: Leg     Time: 1610-9604 PT Time Calculation (min) (ACUTE ONLY): 27 min  Charges:    $Gait Training: 8-22 mins $Therapeutic Exercise: 8-22 mins PT General Charges $$ ACUTE PT VISIT: 1 Visit                     Malachi Pro, DPT 02/19/2023, 11:41 AM

## 2023-02-19 NOTE — TOC Progression Note (Signed)
Transition of Care [facility]) - Progression Note    Patient Details  Name: Roberta Ross MRN: [id number] Date of Birth: 1933/11/09  [facility]) CM/SW Contact  Garret Reddish, RN Phone Number: 02/19/2023, 1:50 PM  Clinical Narrative:     Chart reviewed.  I spoke with [name], Admission Coordinator at [facility].  She informs me that she is not able to accept patient back to [place].  She reports that patient would need to be back to baseline or close to returning back to baseline prior to returning to the facility.   I have spoken to patient's daughter [name].  I have informed her that [name] is not able to accept patient back at this time due to patient not being back at her baseline.  [name] is agreeable to short-term rehab to assist with strengthen prior to returning to [place].  [name] reports that patient has been to [facility] before.  She is agreeable to a bed search in the [place] area.    Bed search completed in the [place] area. I have informed Dr. Allena Katz of the above information.        Expected Discharge Plan and Services                                               Social Determinants of Health (SDOH) Interventions SDOH Screenings   Food Insecurity: No Food Insecurity (02/15/2023)  Housing: Low Risk  (02/15/2023)  Transportation Needs: No Transportation Needs (02/15/2023)  Utilities: Not At Risk (02/15/2023)  Tobacco Use: Medium Risk (11/21/2022)   Received from [facility]    Readmission Risk Interventions     No data to display

## 2023-02-20 DIAGNOSIS — U071 COVID-19: Secondary | ICD-10-CM | POA: Diagnosis not present

## 2023-02-20 LAB — CBC
HCT: 35.3 % — ABNORMAL LOW (ref 36.0–46.0)
Hemoglobin: 11.6 g/dL — ABNORMAL LOW (ref 12.0–15.0)
MCH: 29.8 pg (ref 26.0–34.0)
MCHC: 32.9 g/dL (ref 30.0–36.0)
MCV: 90.7 fL (ref 80.0–100.0)
Platelets: 261 10*3/uL (ref 150–400)
RBC: 3.89 MIL/uL (ref 3.87–5.11)
RDW: 12.8 % (ref 11.5–15.5)
WBC: 9.1 10*3/uL (ref 4.0–10.5)
nRBC: 0 % (ref 0.0–0.2)

## 2023-02-20 LAB — CULTURE, BLOOD (ROUTINE X 2)
Culture: NO GROWTH
Culture: NO GROWTH
Special Requests: ADEQUATE
Special Requests: ADEQUATE

## 2023-02-20 NOTE — Progress Notes (Signed)
Physical Therapy Treatment Patient Details Name: Roberta Ross MRN: [id number] DOB: Jan 12, 1934 Today's Date: 02/20/2023   History of Present Illness 89 y.o. female with medical history significant of chronic HFrEF with LVEF 30-35%, HTN, RA with severe finger deformity, HLD, CKD stage IIIb, PAF not on anticoagulation due to history of SAH sent from nursing home for evaluation of worsening of cough shortness of breath.     Found to be Covid +    PT Comments  Pt initially refused working with PT stating that she feels poorly, but with light encouragement she was willing to do a little.  She needed more assist to day with bed mobility and with getting to standing than she did for yesterday's session.  She was fatigued with the effort and requested to sit after short time in standing/side stepping along EOB, but refusing ambulation.  Pt showed limited but good effort, continue with POC.     If plan is discharge home, recommend the following: Assistance with cooking/housework;Assist for transportation;A lot of help with walking and/or transfers;A lot of help with bathing/dressing/bathroom   Can travel by private vehicle     No  Equipment Recommendations  None recommended by PT    Recommendations for Other Services       Precautions / Restrictions Precautions Precautions: Fall Precaution Comments: airborne iso Restrictions Weight Bearing Restrictions: No     Mobility  Bed Mobility Overal bed mobility: Needs Assistance Bed Mobility: Supine to Sit     Supine to sit: Mod assist, Min assist     General bed mobility comments: Pt is able to tolerate rolling toward EOB but was slow and labored and needed assist with LEs today that she did not yesterday    Transfers Overall transfer level: Needs assistance Equipment used: Rolling walker (2 wheels) Transfers: Sit to/from Stand Sit to Stand: Mod assist           General transfer comment: Again needing direct assist to get to  standing - struggled to initiate upward movement, ultimately needing bed elevated ~3" and modA to rise    Ambulation/Gait               General Gait Details: Pt feeling very weak today and despite much cuing she refused any true ambulation, ultimately she managed a few labored side steps along EOB before needing to sit and requesting to be done with PT this date.   Stairs             Wheelchair Mobility     Tilt Bed    Modified Rankin (Stroke Patients Only)       Balance Overall balance assessment: Needs assistance Sitting-balance support: Bilateral upper extremity supported Sitting balance-Leahy Scale: Fair Sitting balance - Comments: Pt tending to lean forward on knees and generally show poor tolerance in sitting but did manage to maintain sitting balance for 30+ seconds at a time before needing light assist to return to upright posturing     Standing balance-Leahy Scale: Fair Standing balance comment: reliant on walker, poor standing tolerance but no overt LOBs with limited standing                            Cognition Arousal: Alert Behavior During Therapy: Anxious Overall Cognitive Status: History of cognitive impairments - at baseline  General Comments: Pt initially did not want to do anything with PT but did agree with gentle reinforcement/cuing        Exercises      General Comments        Pertinent Vitals/Pain Pain Assessment Pain Assessment: Faces Faces Pain Scale: Hurts little more Pain Location: reports general pain from arthritis t/o U&LEs    Home Living                          Prior Function            PT Goals (current goals can now be found in the care plan section) Progress towards PT goals:  (slow progress)    Frequency    Min 1X/week      PT Plan      Co-evaluation              AM-PAC PT "6 Clicks" Mobility   Outcome Measure  Help  needed turning from your back to your side while in a flat bed without using bedrails?: A Little Help needed moving from lying on your back to sitting on the side of a flat bed without using bedrails?: A Lot Help needed moving to and from a bed to a chair (including a wheelchair)?: A Lot Help needed standing up from a chair using your arms (e.g., wheelchair or bedside chair)?: A Lot Help needed to walk in hospital room?: Total Help needed climbing 3-5 steps with a railing? : Total 6 Click Score: 11    End of Session Equipment Utilized During Treatment: Gait belt Activity Tolerance: Patient limited by fatigue;Patient limited by pain Patient left: with call bell/phone within reach;with chair alarm set Nurse Communication: Mobility status PT Visit Diagnosis: Muscle weakness (generalized) (M62.81);Difficulty in walking, not elsewhere classified (R26.2);Pain Pain - Right/Left: Right (&L) Pain - part of body: Leg     Time: 9629-5284 PT Time Calculation (min) (ACUTE ONLY): 27 min  Charges:    $Therapeutic Activity: 23-37 mins PT General Charges $$ ACUTE PT VISIT: 1 Visit                     Malachi Pro, DPT 02/20/2023, 3:22 PM

## 2023-02-20 NOTE — Progress Notes (Signed)
[facility]  - [place] at [facility]   PATIENT NAME: Roberta Ross    MR#:  [id number]  DATE OF BIRTH:  1933-08-16  SUBJECTIVE:   no family at bedside. Remains weak.no fever. No issues per RN  VITALS:  Blood pressure 97/65, pulse 86, temperature 97.7 F (36.5 C), resp. rate 19, height 5\' 5"  (1.651 m), weight 54.4 kg, SpO2 95%.  PHYSICAL EXAMINATION:   GENERAL:  89 y.o.-year-old patient with no acute distress.  LUNGS: Normal breath sounds bilaterally CARDIOVASCULAR: S1, S2 normal. No murmur   ABDOMEN: Soft, nontender, nondistended.  EXTREMITIES: No  edema b/l.    NEUROLOGIC: nonfocal  patient is alert and awake  LABORATORY PANEL:  CBC Recent Labs  Lab 02/20/23 0403  WBC 9.1  HGB 11.6*  HCT 35.3*  PLT 261    Chemistries  Recent Labs  Lab 02/15/23 0813 02/16/23 0543  NA 136 137  K 4.2 3.9  CL 100 106  CO2 25 22  GLUCOSE 102* 98  BUN 17 14  CREATININE 1.15* 1.13*  CALCIUM 9.6 9.3  AST 16  --   ALT 10  --   ALKPHOS 81  --   BILITOT 0.6  --     Assessment and Plan  Roberta Ross is a 89 y.o. female  with medical history significant of chronic HFrEF with LVEF 30-35%, HTN, RA with severe finger deformity, HLD, CKD stage IIIb, PAF not on anticoagulation due to history of SAH, who was brought from the nursing home for evaluation of productive cough and worsening shortness of breath.  Reportedly, she also had low-grade fever at the nursing home.  She was started on diagnosed with COVID-19 infection at the nursing home on 02/07/2023 and had completed 5 days of Paxlovid.  Despite finishing Paxlovid, she still continued to have productive cough, exertional shortness of breath and low-grade fever.   Acute on chronic systolic CHF: She was taking torsemide at the nursing home.  Change oral Lasix to torsemide. 2D echo in April 2021 showed EF estimated at 30%.    Aortic atherosclerosis atrial fibrillation with RVR: -- Continue Cardizem.   --She is not  on anticoagulation because of her history of fall and intracranial bleed. --Continue aspirin    Questionable left atrial mass:  --2 d echo did not reveal any mass--will defer to PCP If needed repeat at later date    COVID-19 infection (dx 02/07/23) Acute on chronic Bronchitis --sputum cx GNR, GPC--will give 5 days to cefdinir. Finished 3 days of zithromax -- Completed 5 days of Paxlovid prior to admission.  Antitussives as needed.  Analgesics as needed for pleuritic chest pain.  No acute pulmonary embolism or pneumonia on CTA chest.   Thyroid nodule (1.1 x 1.7 cm): Outpatient follow-up with PCP    COPD: Continue bronchodilators as needed --sats >92% on RA    CKD stage IIIa: Creatinine is stable   General Weakness: PT evaluation--rec rehab. Spoke with dter  and would like her to go to [facility]  [facility] evaluated her and feels she is far from her baseline. Dter is agreeable for STR. TOC for STR    Family communication :dter [name] CODE STATUS: DNR DVT Prophylaxis : Level of care: Telemetry Medical Status is: Observation The patient remains OBS appropriate and will d/c before 2 midnights. Awaiting TOC for d/c planning. Medically at baseline    TOTAL TIME TAKING CARE OF THIS PATIENT: 35 minutes.  >50% time spent on counselling and coordination of care  Note: This dictation was prepared with Dragon dictation along with smaller phrase technology. Any transcriptional errors that result from this process are unintentional.  Enedina Finner M.D    [facility]   CC: Primary care physician; Lauro Regulus, MD

## 2023-02-21 DIAGNOSIS — U071 COVID-19: Secondary | ICD-10-CM | POA: Diagnosis not present

## 2023-02-21 MED ORDER — TORSEMIDE 20 MG PO TABS
20.0000 mg | ORAL_TABLET | ORAL | Status: DC
  Administered 2023-02-23 – 2023-03-01 (×4): 20 mg via ORAL
  Filled 2023-02-21 (×4): qty 1

## 2023-02-21 MED ORDER — DILTIAZEM HCL ER COATED BEADS 120 MG PO CP24
120.0000 mg | ORAL_CAPSULE | Freq: Every day | ORAL | Status: DC
  Administered 2023-02-22 – 2023-03-01 (×8): 120 mg via ORAL
  Filled 2023-02-21 (×8): qty 1

## 2023-02-21 NOTE — TOC Progression Note (Signed)
Transition of Care [facility]) - Progression Note    Patient Details  Name: Roberta Ross MRN: [id number] Date of Birth: [birth year]  Transition of Care [facility]) CM/SW Contact  Garret Reddish, RN Phone Number: 02/21/2023, 12:12 PM  Clinical Narrative:     Chart reviewed.  I have reviewed bed offers with [name] patient's daughter.  [name] has accepted bed offer at [facility].  I have informed [name] that I will submit to the insurance company to approve SNF.    SNF authorization submitted.  SNF pending authorization number- [id number].  I have informed Tiffany, Admission Coordinator for [facility] that patient's daughter has accepted bed offer.    TOC will continue to follow for discharge planning.         Expected Discharge Plan and Services                                               Social Determinants of Health (SDOH) Interventions SDOH Screenings   Food Insecurity: No Food Insecurity (02/15/2023)  Housing: Low Risk  (02/15/2023)  Transportation Needs: No Transportation Needs (02/15/2023)  Utilities: Not At Risk (02/15/2023)  Tobacco Use: Medium Risk (11/21/2022)   Received from [facility]    Readmission Risk Interventions     No data to display

## 2023-02-21 NOTE — Plan of Care (Signed)

## 2023-02-21 NOTE — Progress Notes (Signed)
[facility]  - [place] at [facility]   PATIENT NAME: Roberta Ross    MR#:  [id number]  DATE OF BIRTH:  1933-11-15  SUBJECTIVE:   no family at bedside. Spoke with dter [name] and updated Remains weak.no fever. No issues per RN. Pt has to be motivated to work with PT  VITALS:  Blood pressure 95/67, pulse 78, temperature 98 F (36.7 C), resp. rate 18, height 5\' 5"  (1.651 m), weight 54.4 kg, SpO2 95%.  PHYSICAL EXAMINATION:   GENERAL:  89 y.o.-year-old patient with no acute distress. Weak, decondtioned LUNGS: decreased breath sounds bilaterally CARDIOVASCULAR: S1, S2 normal. No murmur   ABDOMEN: Soft, nontender, nondistended.  EXTREMITIES: No  edema b/l. 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 female  with medical history significant of chronic HFrEF with LVEF 30-35%, HTN, RA with severe finger deformity, HLD, CKD stage IIIb, PAF not on anticoagulation due to history of SAH, who was brought from the nursing home for evaluation of productive cough and worsening shortness of breath.  Reportedly, she also had low-grade fever at the nursing home.  She was started on diagnosed with COVID-19 infection at the nursing home on 02/07/2023 and had completed 5 days of Paxlovid.  Despite finishing Paxlovid, she still continued to have productive cough, exertional shortness of breath and low-grade fever.   Acute on chronic systolic CHF: She was taking torsemide at the nursing home.  Change oral Lasix to torsemide--quiet dry so  decreased to 20 mg qod 2D echo in April 2021 showed EF estimated at 30%.    Aortic atherosclerosis atrial fibrillation with RVR: -- Continue Cardizem--dose decreased to 120 mg due to soft BP --She is not on anticoagulation because of her history of fall and intracranial bleed. --Continue aspirin    Questionable left atrial mass:  --2 d echo did not reveal any mass--will defer to PCP If needed repeat at later date    COVID-19 infection (dx 02/07/23) Acute on chronic Bronchitis --sputum cx GNR, GPC--will give 5 days to cefdinir. Finished 3 days of zithromax -- Completed 5 days of Paxlovid prior to admission.  Antitussives as needed.  Analgesics as needed for pleuritic chest pain.  No acute pulmonary embolism or pneumonia on CTA chest.   Thyroid nodule (1.1 x 1.7 cm): Outpatient follow-up with PCP    COPD: Continue bronchodilators as needed --sats >92% on RA    CKD stage IIIa: Creatinine is stable   General Weakness H/o RA with severe DJD in both UE and LE -- PT evaluation--rec rehab. Spoke with dter  and would like her to go to [facility]  [facility] evaluated her and feels she is far from her baseline. Dter is agreeable for STR. TOC for STR--awaiting bed and insurance auth    Family communication :dter [name] CODE STATUS: DNR DVT Prophylaxis :  Level of care: Telemetry Medical Status is: Observation The patient remains OBS appropriate and will d/c before 2 midnights. Awaiting TOC for d/c planning. Medically at baseline    TOTAL TIME TAKING CARE OF THIS PATIENT: 35 minutes.  >50% time spent on counselling and coordination of care  Note: This dictation was prepared with Dragon dictation along with smaller phrase technology. Any transcriptional errors that result from this process are unintentional.  Enedina Finner M.D    [facility]   CC: Primary care physician; Lauro Regulus, MD

## 2023-02-22 ENCOUNTER — Encounter: Payer: Self-pay | Admitting: Internal Medicine

## 2023-02-22 DIAGNOSIS — U071 COVID-19: Secondary | ICD-10-CM | POA: Diagnosis not present

## 2023-02-22 DIAGNOSIS — J157 Pneumonia due to Mycoplasma pneumoniae: Secondary | ICD-10-CM | POA: Diagnosis present

## 2023-02-22 DIAGNOSIS — I5023 Acute on chronic systolic (congestive) heart failure: Secondary | ICD-10-CM | POA: Diagnosis not present

## 2023-02-22 MED ORDER — POLYETHYLENE GLYCOL 3350 17 G PO PACK
17.0000 g | PACK | Freq: Every day | ORAL | Status: DC
  Administered 2023-02-22 – 2023-02-25 (×4): 17 g via ORAL
  Filled 2023-02-22 (×4): qty 1

## 2023-02-22 NOTE — TOC Progression Note (Signed)
Transition of Care [facility]) - Progression Note    Patient Details  Name: Roberta Ross MRN: [id number] Date of Birth: [birth year]  [facility]) CM/SW Contact  Garret Reddish, RN Phone Number: 02/22/2023, 1:00 PM  Clinical Narrative:     Authorization approved for SNF.  I have spoken with [facility], Admissions Coordinator and she informs me that she will not be able to accept patient till Monday.  She reports that because the patient had Covid the earlier she can accept patient is Monday. October 28th 2024.  TOC will continue to follow for discharge planning.         Expected Discharge Plan and Services                                               Social Determinants of Health (SDOH) Interventions SDOH Screenings   Food Insecurity: No Food Insecurity (02/15/2023)  Housing: Low Risk  (02/15/2023)  Transportation Needs: No Transportation Needs (02/15/2023)  Utilities: Not At Risk (02/15/2023)  Tobacco Use: Medium Risk (02/22/2023)    Readmission Risk Interventions     No data to display

## 2023-02-22 NOTE — Progress Notes (Signed)
Physical Therapy Treatment Patient Details Name: Roberta Ross MRN: [id number] DOB: 14-May-1933 Today's Date: 02/22/2023   History of Present Illness 89 y.o. female with medical history significant of chronic HFrEF with LVEF 30-35%, HTN, RA with severe finger deformity, HLD, CKD stage IIIb, PAF not on anticoagulation due to history of SAH sent from nursing home for evaluation of worsening of cough shortness of breath.     Found to be Covid +    PT Comments  Patient received in bed, she is agreeable to PT session states "I'll try". Patient is able to perform bed mobility with supervision only. She requires cues to scoot out to edge of bed. She stood with +1 min A, but required +2 min A to step pivot to recliner. Patient is limited by fear and weakness. She will continue to benefit from skilled PT to improve functional independence and safety with mobility.       If plan is discharge home, recommend the following: Assistance with cooking/housework;Assist for transportation;A lot of help with walking and/or transfers;A lot of help with bathing/dressing/bathroom   Can travel by private vehicle     No  Equipment Recommendations  None recommended by PT    Recommendations for Other Services       Precautions / Restrictions Precautions Precautions: Fall Precaution Comments: airborne iso Restrictions Weight Bearing Restrictions: No     Mobility  Bed Mobility Overal bed mobility: Modified Independent Bed Mobility: Supine to Sit     Supine to sit: Modified independent (Device/Increase time)     General bed mobility comments: Patient able to get to edge of bed with cues. No physical assist needed. Increased time    Transfers Overall transfer level: Needs assistance Equipment used: None Transfers: Sit to/from Stand, Bed to chair/wheelchair/BSC Sit to Stand: Min assist, +2 safety/equipment   Step pivot transfers: Min assist, +2 physical assistance       General transfer  comment: Min +1 needed to stand. Once standing patient anxious and needed +2 min A to step pivot to recliner.    Ambulation/Gait                   Stairs             Wheelchair Mobility     Tilt Bed    Modified Rankin (Stroke Patients Only)       Balance Overall balance assessment: Needs assistance Sitting-balance support: Feet supported Sitting balance-Leahy Scale: Good       Standing balance-Leahy Scale: Fair                              Cognition Arousal: Alert Behavior During Therapy: Anxious Overall Cognitive Status: History of cognitive impairments - at baseline                                          Exercises      General Comments        Pertinent Vitals/Pain Pain Assessment Pain Assessment: No/denies pain    Home Living                          Prior Function            PT Goals (current goals can now be found in the care plan section) Acute Rehab PT  Goals Patient Stated Goal: get feeling better PT Goal Formulation: With patient Time For Goal Achievement: 03/01/23 Potential to Achieve Goals: Fair Progress towards PT goals: Progressing toward goals    Frequency    Min 1X/week      PT Plan      Co-evaluation              AM-PAC PT "6 Clicks" Mobility   Outcome Measure  Help needed turning from your back to your side while in a flat bed without using bedrails?: A Little Help needed moving from lying on your back to sitting on the side of a flat bed without using bedrails?: A Little Help needed moving to and from a bed to a chair (including a wheelchair)?: A Lot Help needed standing up from a chair using your arms (e.g., wheelchair or bedside chair)?: A Little Help needed to walk in hospital room?: Total Help needed climbing 3-5 steps with a railing? : Total 6 Click Score: 13    End of Session Equipment Utilized During Treatment: Gait belt Activity Tolerance: Patient  limited by fatigue;Patient limited by pain Patient left: with call bell/phone within reach;with chair alarm set Nurse Communication: Mobility status PT Visit Diagnosis: Muscle weakness (generalized) (M62.81);Difficulty in walking, not elsewhere classified (R26.2);Other abnormalities of gait and mobility (R26.89);Unsteadiness on feet (R26.81)     Time: 4782-9562 PT Time Calculation (min) (ACUTE ONLY): 18 min  Charges:    $Therapeutic Activity: 8-22 mins PT General Charges $$ ACUTE PT VISIT: 1 Visit                     Tahliyah Anagnos, PT, GCS 02/22/23,3:18 PM

## 2023-02-22 NOTE — Progress Notes (Addendum)
Progress Note    Roberta Ross  ZOX:[id number] DOB: 19-Nov-1933  DOA: 02/15/2023 PCP: Lauro Regulus, MD      Brief Narrative:    Medical records reviewed and are as summarized below:  Roberta Ross is a 89 y.o. female  with medical history significant of chronic HFrEF with LVEF 30-35%, HTN, RA with severe finger deformity, HLD, CKD stage IIIb, PAF not on anticoagulation due to history of SAH, who was brought from the nursing home for evaluation of productive cough and worsening shortness of breath.  Reportedly, she also had low-grade fever at the nursing home.  She was started on diagnosed with COVID-19 infection at the nursing home on 02/07/2023 and had completed 5 days of Paxlovid.  Despite finishing Paxlovid, she still continued to have productive cough, exertional shortness of breath and low-grade fever.   She was admitted to the hospital for acute on chronic systolic CHF.  She was also found to have mycoplasma pneumoniae pneumonia.   Assessment/Plan:   Principal Problem:   COVID Active Problems:   Acute on chronic systolic CHF (congestive heart failure) (HCC)   Atrial fibrillation, chronic (HCC)   COVID-19 virus infection   Mycoplasma pneumoniae pneumonia   Body mass index is 19.97 kg/m.   Acute on chronic systolic CHF: Continue torsemide. 2D echo on 10/19 showed EF estimated 25 to 30%, severely dilated left and right atria, moderately reduced RV function. No left atrial mass on 2D echo. 2D echo in April 2021 showed EF estimated at 30%.   Aortic atherosclerosis atrial fibrillation with RVR: Heart rate has improved.  Continue aspirin and Cardizem.  She is not on anticoagulation because of history of fall and intracranial bleed.   COVID-19 infection: Completed 5 days of Paxlovid prior to admission. Mycoplasma pneumoniae pneumonia: Mycoplasma pneumonia IgM was positive (970).  Patient had already received 3 days of azithromycin (completed on  02/18/2023). Plan to complete 5 days of Omnicef on 02/22/2023 No acute pulmonary embolism or pneumonia on CTA chest. Azithromycin was discontinued on 02/16/2023   Thyroid nodule (1.1 x 1.7 cm): Outpatient follow-up with PCP Descending thoracic aortic aneurysm 3.8 cm: Outpatient follow-up with PCP   COPD: Continue bronchodilators as needed   CKD stage IIIa: Creatinine is stable   General Weakness: PT recommended discharge to SNF.  Awaiting placement to SNF.  Patient can go to SNF until Monday, 02/25/2023 because of recent positive COVID-19 test.    Diet Order             Diet Heart Room service appropriate? Yes; Fluid consistency: Thin  Diet effective now                            Consultants: None  Procedures: None    Medications:    aspirin EC  81 mg Oral Daily   cefdinir  300 mg Oral Q12H   diltiazem  120 mg Oral Daily   guaiFENesin  600 mg Oral BID   heparin  5,000 Units Subcutaneous Q12H   polyethylene glycol  17 g Oral Daily   senna-docusate  2 tablet Oral BID   simvastatin  10 mg Oral QPM   [START ON 02/23/2023] torsemide  20 mg Oral QODAY   Continuous Infusions:   Anti-infectives (From admission, onward)    Start     Dose/Rate Route Frequency Ordered Stop   02/18/23 1000  cefdinir (OMNICEF) capsule 300 mg  300 mg Oral Every 12 hours 02/18/23 0855 02/23/23 0959   02/18/23 0945  azithromycin (ZITHROMAX) tablet 500 mg        500 mg Oral  Once 02/18/23 0855 02/18/23 1020   02/15/23 1330  azithromycin (ZITHROMAX) tablet 500 mg  Status:  Discontinued        500 mg Oral Daily 02/15/23 1317 02/16/23 1631   02/15/23 1245  nirmatrelvir/ritonavir (renal dosing) (PAXLOVID) 2 tablet  Status:  Discontinued        2 tablet Oral 2 times daily 02/15/23 1235 02/15/23 1303              Family Communication/Anticipated D/C date and plan/Code Status   DVT prophylaxis: heparin injection 5,000 Units Start: 02/15/23 1245     Code  Status: Limited: Do not attempt resuscitation (DNR) -DNR-LIMITED -Do Not Intubate/DNI   Family Communication: None Disposition Plan: Plan to discharge to SNF   Status is: Observation The patient will require care spanning > 2 midnights and should be moved to inpatient because: General Weakness       Subjective:   Interval is noted.  She says she is feeling better today.  Cough is better.  Objective:    Vitals:   02/21/23 1942 02/22/23 0422 02/22/23 0734 02/22/23 0958  BP: 90/71 107/72 100/70 99/68  Pulse: 85 72 90 83  Resp: 20 16 18    Temp: 98.4 F (36.9 C) 98.3 F (36.8 C) 98.8 F (37.1 C)   TempSrc:      SpO2: 97% 96% 97% 98%  Weight:      Height:       No data found.  No intake or output data in the 24 hours ending 02/22/23 1411 Filed Weights   02/15/23 0701  Weight: 54.4 kg    Exam:  GEN: NAD SKIN: Warm and dry EYES: No pallor or icterus ENT: MMM CV: RRR PULM: No wheezing or rales. ABD: soft, ND, NT, +BS CNS: AAO x 2 (person and place), non focal EXT: Rheumatoid hands      Data Reviewed:   I have personally reviewed following labs and imaging studies:  Labs: Labs show the following:   Basic Metabolic Panel: Recent Labs  Lab 02/16/23 0543  NA 137  K 3.9  CL 106  CO2 22  GLUCOSE 98  BUN 14  CREATININE 1.13*  CALCIUM 9.3   GFR Estimated Creatinine Clearance: 30.7 mL/min (A) (by C-G formula based on SCr of 1.13 mg/dL (H)). Liver Function Tests: No results for input(s): "AST", "ALT", "ALKPHOS", "BILITOT", "PROT", "ALBUMIN" in the last 168 hours.  No results for input(s): "LIPASE", "AMYLASE" in the last 168 hours. No results for input(s): "AMMONIA" in the last 168 hours. Coagulation profile No results for input(s): "INR", "PROTIME" in the last 168 hours.  CBC: Recent Labs  Lab 02/20/23 0403  WBC 9.1  HGB 11.6*  HCT 35.3*  MCV 90.7  PLT 261   Cardiac Enzymes: No results for input(s): "CKTOTAL", "CKMB", "CKMBINDEX",  "TROPONINI" in the last 168 hours. BNP (last 3 results) No results for input(s): "PROBNP" in the last 8760 hours. CBG: No results for input(s): "GLUCAP" in the last 168 hours. D-Dimer: No results for input(s): "DDIMER" in the last 72 hours.  Hgb A1c: No results for input(s): "HGBA1C" in the last 72 hours. Lipid Profile: No results for input(s): "CHOL", "HDL", "LDLCALC", "TRIG", "CHOLHDL", "LDLDIRECT" in the last 72 hours. Thyroid function studies: No results for input(s): "TSH", "T4TOTAL", "T3FREE", "THYROIDAB" in the last 72  hours.  Invalid input(s): "FREET3"  Anemia work up: No results for input(s): "VITAMINB12", "FOLATE", "FERRITIN", "TIBC", "IRON", "RETICCTPCT" in the last 72 hours. Sepsis Labs: Recent Labs  Lab 02/20/23 0403  WBC 9.1    Microbiology Recent Results (from the past 240 hour(s))  Blood culture (routine x 2)     Status: None   Collection Time: 02/15/23  8:15 AM   Specimen: BLOOD  Result Value Ref Range Status   Specimen Description BLOOD BLOOD LEFT ARM  Final   Special Requests   Final    BOTTLES DRAWN AEROBIC AND ANAEROBIC Blood Culture adequate volume   Culture   Final    NO GROWTH 5 DAYS Performed at [facility], [address] [place], [place] [address]    Report Status 02/20/2023 FINAL  Final  Blood culture (routine x 2)     Status: None   Collection Time: 02/15/23  8:15 AM   Specimen: BLOOD  Result Value Ref Range Status   Specimen Description BLOOD BLOOD RIGHT ARM  Final   Special Requests   Final    BOTTLES DRAWN AEROBIC AND ANAEROBIC Blood Culture adequate volume   Culture   Final    NO GROWTH 5 DAYS Performed at [facility], [address] [place], [place] [address]    Report Status 02/20/2023 FINAL  Final  SARS Coronavirus 2 by RT PCR (hospital order, performed in [facility] hospital lab) *cepheid single result test* Anterior Nasal Swab     Status: Abnormal   Collection Time: 02/15/23  8:15 AM   Specimen:  Anterior Nasal Swab  Result Value Ref Range Status   SARS Coronavirus 2 by RT PCR POSITIVE (A) NEGATIVE Final    Comment: (NOTE) SARS-CoV-2 target nucleic acids are DETECTED  SARS-CoV-2 RNA is generally detectable in upper respiratory specimens  during the acute phase of infection.  Positive results are indicative  of the presence of the identified virus, but do not rule out bacterial infection or co-infection with other pathogens not detected by the test.  Clinical correlation with patient history and  other diagnostic information is necessary to determine patient infection status.  The expected result is negative.  Fact Sheet for Patients:   RoadLapTop.co.za   Fact Sheet for Healthcare Providers:   http://kim-miller.com/    This test is not yet approved or cleared by the [place] FDA and  has been authorized for detection and/or diagnosis of SARS-CoV-2 by FDA under an Emergency Use Authorization (EUA).  This EUA will remain in effect (meaning this test can be used) for the duration of  the COVID-19 declaration under Section 564(b)(1)  of the Act, 21 U.S.C. section 360-bbb-3(b)(1), unless the authorization is terminated or revoked sooner.   Performed at [facility], [address] [place], [place] [address]   Expectorated Sputum Assessment w Gram Stain, Rflx to Resp Cult     Status: None   Collection Time: 02/15/23 11:52 PM   Specimen: Expectorated Sputum  Result Value Ref Range Status   Specimen Description EXPECTORATED SPUTUM  Final   Special Requests NONE  Final   Sputum evaluation   Final    THIS SPECIMEN IS ACCEPTABLE FOR SPUTUM CULTURE Performed at [facility], [address], [place], [place] [address]    Report Status 02/16/2023 FINAL  Final  Culture, Respiratory w Gram Stain     Status: None   Collection Time: 02/15/23 11:52 PM  Result Value Ref Range Status   Specimen Description  Final     EXPECTORATED SPUTUM Performed at [facility], [address] [place], [place] [address]    Special Requests   Final    NONE Reflexed from [phone] Performed at [facility], [address] [place], [place] [address]    Gram Stain   Final    FEW WBC PRESENT, PREDOMINANTLY PMN FEW GRAM POSITIVE COCCI RARE GRAM NEGATIVE RODS    Culture   Final    MODERATE Consistent with normal respiratory flora. No Pseudomonas species isolated Performed at [facility], [address] [place], [place] [address]    Report Status 02/18/2023 FINAL  Final    Procedures and diagnostic studies:  No results found.             LOS: 0 days   Dellis Voght  [facility]   Pager on [email or web address]. If 7PM-7AM, please contact night-coverage at [email or web address]     02/22/2023, 2:11 PM

## 2023-02-23 DIAGNOSIS — U071 COVID-19: Secondary | ICD-10-CM | POA: Diagnosis not present

## 2023-02-23 LAB — CBC
HCT: 47.4 % — ABNORMAL HIGH (ref 36.0–46.0)
Hemoglobin: 14.9 g/dL (ref 12.0–15.0)
MCH: 31.3 pg (ref 26.0–34.0)
MCHC: 31.4 g/dL (ref 30.0–36.0)
MCV: 99.6 fL (ref 80.0–100.0)
Platelets: 164 10*3/uL (ref 150–400)
RBC: 4.76 MIL/uL (ref 3.87–5.11)
RDW: 14.3 % (ref 11.5–15.5)
WBC: 12 10*3/uL — ABNORMAL HIGH (ref 4.0–10.5)
nRBC: 0 % (ref 0.0–0.2)

## 2023-02-23 MED ORDER — BISACODYL 10 MG RE SUPP
10.0000 mg | Freq: Once | RECTAL | Status: AC
  Administered 2023-02-23: 10 mg via RECTAL
  Filled 2023-02-23: qty 1

## 2023-02-23 MED ORDER — MAGNESIUM HYDROXIDE 400 MG/5ML PO SUSP
15.0000 mL | Freq: Every day | ORAL | Status: DC | PRN
  Filled 2023-02-23: qty 30

## 2023-02-23 NOTE — Progress Notes (Signed)
Progress Note    Roberta Ross  YQM:[id number] DOB: 03/12/1934  DOA: 02/15/2023 PCP: Lauro Regulus, MD      Brief Narrative:    Medical records reviewed and are as summarized below:  Roberta Ross is a 89 y.o. female  with medical history significant of chronic HFrEF with LVEF 30-35%, HTN, RA with severe finger deformity, HLD, CKD stage IIIb, PAF not on anticoagulation due to history of SAH, who was brought from the nursing home for evaluation of productive cough and worsening shortness of breath.  Reportedly, she also had low-grade fever at the nursing home.  She was started on diagnosed with COVID-19 infection at the nursing home on 02/07/2023 and had completed 5 days of Paxlovid.  Despite finishing Paxlovid, she still continued to have productive cough, exertional shortness of breath and low-grade fever.   She was admitted to the hospital for acute on chronic systolic CHF.  She was also found to have mycoplasma pneumoniae pneumonia.   Assessment/Plan:   Principal Problem:   COVID Active Problems:   Acute on chronic systolic CHF (congestive heart failure) (HCC)   Atrial fibrillation, chronic (HCC)   COVID-19 virus infection   Mycoplasma pneumoniae pneumonia   Body mass index is 19.97 kg/m.   Acute on chronic systolic CHF: Stable.  Continue torsemide 2D echo on 10/19 showed EF estimated 25 to 30%, severely dilated left and right atria, moderately reduced RV function. No left atrial mass on 2D echo. 2D echo in April 2021 showed EF estimated at 30%.   Aortic atherosclerosis atrial fibrillation with RVR: Heart rate has improved.  Continue aspirin and Cardizem.  She is not on anticoagulation because of history of fall and intracranial bleed.   COVID-19 infection: Completed 5 days of Paxlovid prior to admission. Mycoplasma pneumoniae pneumonia: Mycoplasma pneumonia IgM was positive (970).  Patient had already received 3 days of azithromycin (completed on  02/18/2023). She completed 5 days of Omnicef on 02/22/2023 No acute pulmonary embolism or pneumonia on CTA chest.   Thyroid nodule (1.1 x 1.7 cm): Outpatient follow-up with PCP Descending thoracic aortic aneurysm 3.8 cm: Outpatient follow-up with PCP   COPD: Continue bronchodilators as needed   CKD stage IIIa: Creatinine is stable   General Weakness: PT recommended discharge to SNF.  Awaiting placement to SNF.  Patient can go to SNF until Monday, 02/25/2023 because of recent positive COVID-19 test.    Diet Order             Diet Heart Room service appropriate? Yes; Fluid consistency: Thin  Diet effective now                            Consultants: None  Procedures: None    Medications:    aspirin EC  81 mg Oral Daily   diltiazem  120 mg Oral Daily   guaiFENesin  600 mg Oral BID   heparin  5,000 Units Subcutaneous Q12H   polyethylene glycol  17 g Oral Daily   senna-docusate  2 tablet Oral BID   simvastatin  10 mg Oral QPM   torsemide  20 mg Oral QODAY   Continuous Infusions:   Anti-infectives (From admission, onward)    Start     Dose/Rate Route Frequency Ordered Stop   02/18/23 1000  cefdinir (OMNICEF) capsule 300 mg        300 mg Oral Every 12 hours 02/18/23 0855 02/22/23 2206   02/18/23 0945  azithromycin [facility]) tablet 500 mg        500 mg Oral  Once 02/18/23 0855 02/18/23 1020   02/15/23 1330  azithromycin (ZITHROMAX) tablet 500 mg  Status:  Discontinued        500 mg Oral Daily 02/15/23 1317 02/16/23 1631   02/15/23 1245  nirmatrelvir/ritonavir (renal dosing) (PAXLOVID) 2 tablet  Status:  Discontinued        2 tablet Oral 2 times daily 02/15/23 1235 02/15/23 1303              Family Communication/Anticipated D/C date and plan/Code Status   DVT prophylaxis: heparin injection 5,000 Units Start: 02/15/23 1245     Code Status: Limited: Do not attempt resuscitation (DNR) -DNR-LIMITED -Do Not Intubate/DNI   Family  Communication: None Disposition Plan: Plan to discharge to SNF   Status is: Observation The patient will require care spanning > 2 midnights and should be moved to inpatient because: General Weakness       Subjective:   Interval events noted.  She has no complaints.  She feels much better today.  She is feeling stronger.  Cough has improved.  Objective:    Vitals:   02/22/23 2003 02/23/23 0723 02/23/23 0800 02/23/23 1146  BP: 95/71 113/79  (!) 103/56  Pulse: 76  92 64  Resp: 16 16  19   Temp: 97.6 F (36.4 C) 98.8 F (37.1 C)  (!) 97.5 F (36.4 C)  TempSrc: Oral   Axillary  SpO2: 97%  97% 97%  Weight:      Height:       No data found.   Intake/Output Summary (Last 24 hours) at 02/23/2023 1152 Last data filed at 02/22/2023 2206 Gross per 24 hour  Intake 120 ml  Output --  Net 120 ml   Filed Weights   02/15/23 0701  Weight: 54.4 kg    Exam:  GEN: NAD SKIN: Warm and dry EYES: No pallor or icterus ENT: MMM CV: RRR PULM: CTA B ABD: soft, ND, NT, +BS CNS: AAO x 3, non focal EXT: No edema or tenderness.  Rheumatoid hands, hammertoes      Data Reviewed:   I have personally reviewed following labs and imaging studies:  Labs: Labs show the following:   Basic Metabolic Panel: No results for input(s): "NA", "K", "CL", "CO2", "GLUCOSE", "BUN", "CREATININE", "CALCIUM", "MG", "PHOS" in the last 168 hours.  GFR Estimated Creatinine Clearance: 30.7 mL/min (A) (by C-G formula based on SCr of 1.13 mg/dL (H)). Liver Function Tests: No results for input(s): "AST", "ALT", "ALKPHOS", "BILITOT", "PROT", "ALBUMIN" in the last 168 hours.  No results for input(s): "LIPASE", "AMYLASE" in the last 168 hours. No results for input(s): "AMMONIA" in the last 168 hours. Coagulation profile No results for input(s): "INR", "PROTIME" in the last 168 hours.  CBC: Recent Labs  Lab 02/20/23 0403 02/23/23 0433  WBC 9.1 12.0*  HGB 11.6* 14.9  HCT 35.3* 47.4*  MCV 90.7  99.6  PLT 261 164   Cardiac Enzymes: No results for input(s): "CKTOTAL", "CKMB", "CKMBINDEX", "TROPONINI" in the last 168 hours. BNP (last 3 results) No results for input(s): "PROBNP" in the last 8760 hours. CBG: No results for input(s): "GLUCAP" in the last 168 hours. D-Dimer: No results for input(s): "DDIMER" in the last 72 hours.  Hgb A1c: No results for input(s): "HGBA1C" in the last 72 hours. Lipid Profile: No results for input(s): "CHOL", "HDL", "LDLCALC", "TRIG", "CHOLHDL", "LDLDIRECT" in the last 72 hours. Thyroid function studies: No  results for input(s): "TSH", "T4TOTAL", "T3FREE", "THYROIDAB" in the last 72 hours.  Invalid input(s): "FREET3"  Anemia work up: No results for input(s): "VITAMINB12", "FOLATE", "FERRITIN", "TIBC", "IRON", "RETICCTPCT" in the last 72 hours. Sepsis Labs: Recent Labs  Lab 02/20/23 0403 02/23/23 0433  WBC 9.1 12.0*    Microbiology Recent Results (from the past 240 hour(s))  Blood culture (routine x 2)     Status: None   Collection Time: 02/15/23  8:15 AM   Specimen: BLOOD  Result Value Ref Range Status   Specimen Description BLOOD BLOOD LEFT ARM  Final   Special Requests   Final    BOTTLES DRAWN AEROBIC AND ANAEROBIC Blood Culture adequate volume   Culture   Final    NO GROWTH 5 DAYS Performed at [facility], [address] [place], [place] [address]    Report Status 02/20/2023 FINAL  Final  Blood culture (routine x 2)     Status: None   Collection Time: 02/15/23  8:15 AM   Specimen: BLOOD  Result Value Ref Range Status   Specimen Description BLOOD BLOOD RIGHT ARM  Final   Special Requests   Final    BOTTLES DRAWN AEROBIC AND ANAEROBIC Blood Culture adequate volume   Culture   Final    NO GROWTH 5 DAYS Performed at [facility], [address] [place], [place] [address]    Report Status 02/20/2023 FINAL  Final  SARS Coronavirus 2 by RT PCR (hospital order, performed in [facility] hospital lab)  *cepheid single result test* Anterior Nasal Swab     Status: Abnormal   Collection Time: 02/15/23  8:15 AM   Specimen: Anterior Nasal Swab  Result Value Ref Range Status   SARS Coronavirus 2 by RT PCR POSITIVE (A) NEGATIVE Final    Comment: (NOTE) SARS-CoV-2 target nucleic acids are DETECTED  SARS-CoV-2 RNA is generally detectable in upper respiratory specimens  during the acute phase of infection.  Positive results are indicative  of the presence of the identified virus, but do not rule out bacterial infection or co-infection with other pathogens not detected by the test.  Clinical correlation with patient history and  other diagnostic information is necessary to determine patient infection status.  The expected result is negative.  Fact Sheet for Patients:   RoadLapTop.co.za   Fact Sheet for Healthcare Providers:   http://kim-miller.com/    This test is not yet approved or cleared by the [place] FDA and  has been authorized for detection and/or diagnosis of SARS-CoV-2 by FDA under an Emergency Use Authorization (EUA).  This EUA will remain in effect (meaning this test can be used) for the duration of  the COVID-19 declaration under Section 564(b)(1)  of the Act, 21 U.S.C. section 360-bbb-3(b)(1), unless the authorization is terminated or revoked sooner.   Performed at [facility], [address] [place], [place] [address]   Expectorated Sputum Assessment w Gram Stain, Rflx to Resp Cult     Status: None   Collection Time: 02/15/23 11:52 PM   Specimen: Expectorated Sputum  Result Value Ref Range Status   Specimen Description EXPECTORATED SPUTUM  Final   Special Requests NONE  Final   Sputum evaluation   Final    THIS SPECIMEN IS ACCEPTABLE FOR SPUTUM CULTURE Performed at [facility], [address], [place], [place] [address]    Report Status 02/16/2023 FINAL  Final  Culture, Respiratory w Gram Stain      Status: None  Collection Time: 02/15/23 11:52 PM  Result Value Ref Range Status   Specimen Description   Final    EXPECTORATED SPUTUM Performed at [facility], [address] [place], [place] [address]    Special Requests   Final    NONE Reflexed from [id number] Performed at [facility], [address] [place], [place] [address]    Gram Stain   Final    FEW WBC PRESENT, PREDOMINANTLY PMN FEW GRAM POSITIVE COCCI RARE GRAM NEGATIVE RODS    Culture   Final    MODERATE Consistent with normal respiratory flora. No Pseudomonas species isolated Performed at [facility], [address], [place], [place] [address]    Report Status 02/18/2023 FINAL  Final    Procedures and diagnostic studies:  No results found.             LOS: 0 days   Adil Tugwell  [facility]   Pager on [email or web address]. If 7PM-7AM, please contact night-coverage at [email or web address]     02/23/2023, 11:52 AM

## 2023-02-23 NOTE — Plan of Care (Signed)

## 2023-02-24 DIAGNOSIS — U071 COVID-19: Secondary | ICD-10-CM | POA: Diagnosis not present

## 2023-02-24 LAB — MRSA NEXT GEN BY PCR, NASAL: MRSA by PCR Next Gen: DETECTED — AB

## 2023-02-24 MED ORDER — MUPIROCIN 2 % EX OINT
TOPICAL_OINTMENT | Freq: Two times a day (BID) | CUTANEOUS | Status: DC
  Filled 2023-02-24: qty 22

## 2023-02-24 NOTE — Plan of Care (Signed)
  Problem: Education: Goal: Knowledge of risk factors and measures for prevention of condition will improve Outcome: Progressing   Problem: Coping: Goal: Psychosocial and spiritual needs will be supported Outcome: Progressing   Problem: Respiratory: Goal: Will maintain a patent airway Outcome: Progressing Goal: Complications related to the disease process, condition or treatment will be avoided or minimized Outcome: Progressing   Problem: Education: Goal: Knowledge of General Education information will improve Description: Including pain rating scale, medication(s)/side effects and non-pharmacologic comfort measures Outcome: Progressing   Problem: Health Behavior/Discharge Planning: Goal: Ability to manage health-related needs will improve Outcome: Progressing   Problem: Clinical Measurements: Goal: Ability to maintain clinical measurements within normal limits will improve Outcome: Progressing Goal: Will remain free from infection Outcome: Progressing Goal: Diagnostic test results will improve Outcome: Progressing Goal: Respiratory complications will improve Outcome: Progressing Goal: Cardiovascular complication will be avoided Outcome: Progressing   Problem: Activity: Goal: Risk for activity intolerance will decrease Outcome: Progressing   

## 2023-02-24 NOTE — Progress Notes (Signed)
Progress Note    Roberta Ross  WUJ:[id number] DOB: Aug 05, 1933  DOA: 02/15/2023 PCP: Lauro Regulus, MD      Brief Narrative:    Medical records reviewed and are as summarized below:  Roberta Ross is a 89 y.o. female  with medical history significant of chronic HFrEF with LVEF 30-35%, HTN, RA with severe finger deformity, HLD, CKD stage IIIb, PAF not on anticoagulation due to history of SAH, who was brought from the nursing home for evaluation of productive cough and worsening shortness of breath.  Reportedly, she also had low-grade fever at the nursing home.  She was started on diagnosed with COVID-19 infection at the nursing home on 02/07/2023 and had completed 5 days of Paxlovid.  Despite finishing Paxlovid, she still continued to have productive cough, exertional shortness of breath and low-grade fever.   She was admitted to the hospital for acute on chronic systolic CHF.  She was also found to have mycoplasma pneumoniae pneumonia.   Assessment/Plan:   Principal Problem:   COVID Active Problems:   Acute on chronic systolic CHF (congestive heart failure) (HCC)   Atrial fibrillation, chronic (HCC)   COVID-19 virus infection   Mycoplasma pneumoniae pneumonia   Body mass index is 19.97 kg/m.   Acute on chronic systolic CHF: Stable.  She is tolerating torsemide. 2D echo on 10/19 showed EF estimated 25 to 30%, severely dilated left and right atria, moderately reduced RV function. No left atrial mass on 2D echo. 2D echo in April 2021 showed EF estimated at 30%.   Aortic atherosclerosis atrial fibrillation with RVR: Heart rate has improved.  Continue aspirin and Cardizem.  She is not on anticoagulation because of history of fall and intracranial bleed.   COVID-19 infection: Completed 5 days of Paxlovid prior to admission. Mycoplasma pneumoniae pneumonia: Mycoplasma pneumonia IgM was positive (970).  Patient had already received 3 days of azithromycin  (completed on 02/18/2023). She completed 5 days of Omnicef on 02/22/2023 No acute pulmonary embolism or pneumonia on CTA chest.   Thyroid nodule (1.1 x 1.7 cm): Outpatient follow-up with PCP Descending thoracic aortic aneurysm 3.8 cm: Outpatient follow-up with PCP   COPD: Continue bronchodilators as needed   CKD stage IIIa: Creatinine is stable   General Weakness: PT recommended discharge to SNF.  Awaiting placement to SNF.  Patient cannot go to SNF until Monday, 02/25/2023 because of recent positive COVID-19 test.    Diet Order             Diet Heart Room service appropriate? Yes; Fluid consistency: Thin  Diet effective now                            Consultants: None  Procedures: None    Medications:    aspirin EC  81 mg Oral Daily   diltiazem  120 mg Oral Daily   guaiFENesin  600 mg Oral BID   heparin  5,000 Units Subcutaneous Q12H   polyethylene glycol  17 g Oral Daily   senna-docusate  2 tablet Oral BID   simvastatin  10 mg Oral QPM   torsemide  20 mg Oral QODAY   Continuous Infusions:   Anti-infectives (From admission, onward)    Start     Dose/Rate Route Frequency Ordered Stop   02/18/23 1000  cefdinir (OMNICEF) capsule 300 mg        300 mg Oral Every 12 hours 02/18/23 0855 02/22/23 2206  02/18/23 0945  azithromycin (ZITHROMAX) tablet 500 mg        500 mg Oral  Once 02/18/23 0855 02/18/23 1020   02/15/23 1330  azithromycin (ZITHROMAX) tablet 500 mg  Status:  Discontinued        500 mg Oral Daily 02/15/23 1317 02/16/23 1631   02/15/23 1245  nirmatrelvir/ritonavir (renal dosing) (PAXLOVID) 2 tablet  Status:  Discontinued        2 tablet Oral 2 times daily 02/15/23 1235 02/15/23 1303              Family Communication/Anticipated D/C date and plan/Code Status   DVT prophylaxis: heparin injection 5,000 Units Start: 02/15/23 1245     Code Status: Limited: Do not attempt resuscitation (DNR) -DNR-LIMITED -Do Not Intubate/DNI    Family Communication: None Disposition Plan: Plan to discharge to SNF   Status is: Observation The patient will require care spanning > 2 midnights and should be moved to inpatient because: General Weakness       Subjective:   No acute events overnight.  No complaints.  She feels better.  Objective:    Vitals:   02/23/23 1146 02/23/23 2046 02/24/23 0530 02/24/23 0808  BP: (!) 103/56 102/78 107/82 (!) 101/56  Pulse: 64 81 77 80  Resp: 19 18 18 18   Temp: (!) 97.5 F (36.4 C) 98.1 F (36.7 C) 97.8 F (36.6 C) 98 F (36.7 C)  TempSrc: Axillary Oral Oral   SpO2: 97% 94% (!) 89% 96%  Weight:      Height:       No data found.   Intake/Output Summary (Last 24 hours) at 02/24/2023 1115 Last data filed at 02/24/2023 1029 Gross per 24 hour  Intake 120 ml  Output 850 ml  Net -730 ml   Filed Weights   02/15/23 0701  Weight: 54.4 kg    Exam:  GEN: NAD SKIN: Warm and dry EYES: No pallor or icterus ENT: MMM CV: RRR PULM: CTA B ABD: soft, ND, NT, +BS CNS: AAO x 3, non focal EXT: No edema or tenderness.  Rheumatoid hands     Data Reviewed:   I have personally reviewed following labs and imaging studies:  Labs: Labs show the following:   Basic Metabolic Panel: No results for input(s): "NA", "K", "CL", "CO2", "GLUCOSE", "BUN", "CREATININE", "CALCIUM", "MG", "PHOS" in the last 168 hours.  GFR Estimated Creatinine Clearance: 30.7 mL/min (A) (by C-G formula based on SCr of 1.13 mg/dL (H)). Liver Function Tests: No results for input(s): "AST", "ALT", "ALKPHOS", "BILITOT", "PROT", "ALBUMIN" in the last 168 hours.  No results for input(s): "LIPASE", "AMYLASE" in the last 168 hours. No results for input(s): "AMMONIA" in the last 168 hours. Coagulation profile No results for input(s): "INR", "PROTIME" in the last 168 hours.  CBC: Recent Labs  Lab 02/20/23 0403 02/23/23 0433  WBC 9.1 12.0*  HGB 11.6* 14.9  HCT 35.3* 47.4*  MCV 90.7 99.6  PLT 261 164    Cardiac Enzymes: No results for input(s): "CKTOTAL", "CKMB", "CKMBINDEX", "TROPONINI" in the last 168 hours. BNP (last 3 results) No results for input(s): "PROBNP" in the last 8760 hours. CBG: No results for input(s): "GLUCAP" in the last 168 hours. D-Dimer: No results for input(s): "DDIMER" in the last 72 hours.  Hgb A1c: No results for input(s): "HGBA1C" in the last 72 hours. Lipid Profile: No results for input(s): "CHOL", "HDL", "LDLCALC", "TRIG", "CHOLHDL", "LDLDIRECT" in the last 72 hours. Thyroid function studies: No results for input(s): "TSH", "T4TOTAL", "  T3FREE", "THYROIDAB" in the last 72 hours.  Invalid input(s): "FREET3"  Anemia work up: No results for input(s): "VITAMINB12", "FOLATE", "FERRITIN", "TIBC", "IRON", "RETICCTPCT" in the last 72 hours. Sepsis Labs: Recent Labs  Lab 02/20/23 0403 02/23/23 0433  WBC 9.1 12.0*    Microbiology Recent Results (from the past 240 hour(s))  Blood culture (routine x 2)     Status: None   Collection Time: 02/15/23  8:15 AM   Specimen: BLOOD  Result Value Ref Range Status   Specimen Description BLOOD BLOOD LEFT ARM  Final   Special Requests   Final    BOTTLES DRAWN AEROBIC AND ANAEROBIC Blood Culture adequate volume   Culture   Final    NO GROWTH 5 DAYS Performed at [facility], [address] [place], [place] [address]    Report Status 02/20/2023 FINAL  Final  Blood culture (routine x 2)     Status: None   Collection Time: 02/15/23  8:15 AM   Specimen: BLOOD  Result Value Ref Range Status   Specimen Description BLOOD BLOOD RIGHT ARM  Final   Special Requests   Final    BOTTLES DRAWN AEROBIC AND ANAEROBIC Blood Culture adequate volume   Culture   Final    NO GROWTH 5 DAYS Performed at [facility], [address] [place], [place] [address]    Report Status 02/20/2023 FINAL  Final  SARS Coronavirus 2 by RT PCR (hospital order, performed in [facility] hospital lab) *cepheid single result  test* Anterior Nasal Swab     Status: Abnormal   Collection Time: 02/15/23  8:15 AM   Specimen: Anterior Nasal Swab  Result Value Ref Range Status   SARS Coronavirus 2 by RT PCR POSITIVE (A) NEGATIVE Final    Comment: (NOTE) SARS-CoV-2 target nucleic acids are DETECTED  SARS-CoV-2 RNA is generally detectable in upper respiratory specimens  during the acute phase of infection.  Positive results are indicative  of the presence of the identified virus, but do not rule out bacterial infection or co-infection with other pathogens not detected by the test.  Clinical correlation with patient history and  other diagnostic information is necessary to determine patient infection status.  The expected result is negative.  Fact Sheet for Patients:   RoadLapTop.co.za   Fact Sheet for Healthcare Providers:   http://kim-miller.com/    This test is not yet approved or cleared by the [place] FDA and  has been authorized for detection and/or diagnosis of SARS-CoV-2 by FDA under an Emergency Use Authorization (EUA).  This EUA will remain in effect (meaning this test can be used) for the duration of  the COVID-19 declaration under Section 564(b)(1)  of the Act, 21 U.S.C. section 360-bbb-3(b)(1), unless the authorization is terminated or revoked sooner.   Performed at [facility], [address] [place], [place] [address]   Expectorated Sputum Assessment w Gram Stain, Rflx to Resp Cult     Status: None   Collection Time: 02/15/23 11:52 PM   Specimen: Expectorated Sputum  Result Value Ref Range Status   Specimen Description EXPECTORATED SPUTUM  Final   Special Requests NONE  Final   Sputum evaluation   Final    THIS SPECIMEN IS ACCEPTABLE FOR SPUTUM CULTURE Performed at [facility], [address] [place], [place] [address]    Report Status 02/16/2023 FINAL  Final  Culture, Respiratory w Gram Stain     Status: None    Collection Time: 02/15/23 11:52 PM  Result Value Ref Range Status   Specimen Description   Final    EXPECTORATED SPUTUM Performed at [facility] [address] [place], [place] [address]    Special Requests   Final    NONE Reflexed from [id number] Performed at [facility], [address] [place], [place] [address]    Gram Stain   Final    FEW WBC PRESENT, PREDOMINANTLY PMN FEW GRAM POSITIVE COCCI RARE GRAM NEGATIVE RODS    Culture   Final    MODERATE Consistent with normal respiratory flora. No Pseudomonas species isolated Performed at [facility], [address], [place], [place] [address]    Report Status 02/18/2023 FINAL  Final    Procedures and diagnostic studies:  No results found.             LOS: 0 days   Honorio Devol  [facility]   Pager on [email or web address]. If 7PM-7AM, please contact night-coverage at [email or web address]     02/24/2023, 11:15 AM

## 2023-02-24 NOTE — Progress Notes (Signed)
Cassandra with Lab called and stated that patients MRSA (nare swab) was positive for MRSA. Made MD aware. Bactroban order placed.

## 2023-02-25 ENCOUNTER — Observation Stay: Payer: Medicare Other

## 2023-02-25 DIAGNOSIS — U071 COVID-19: Secondary | ICD-10-CM | POA: Diagnosis not present

## 2023-02-25 LAB — BASIC METABOLIC PANEL
Anion gap: 9 (ref 5–15)
BUN: 30 mg/dL — ABNORMAL HIGH (ref 8–23)
CO2: 25 mmol/L (ref 22–32)
Calcium: 9.6 mg/dL (ref 8.9–10.3)
Chloride: 96 mmol/L — ABNORMAL LOW (ref 98–111)
Creatinine, Ser: 1.23 mg/dL — ABNORMAL HIGH (ref 0.44–1.00)
GFR, Estimated: 43 mL/min — ABNORMAL LOW (ref >60)
Glucose, Bld: 121 mg/dL — ABNORMAL HIGH (ref 70–99)
Potassium: 3.6 mmol/L (ref 3.5–5.1)
Sodium: 130 mmol/L — ABNORMAL LOW (ref 135–145)

## 2023-02-25 MED ORDER — BISACODYL 10 MG RE SUPP
10.0000 mg | Freq: Once | RECTAL | Status: AC
  Administered 2023-02-25: 10 mg via RECTAL
  Filled 2023-02-25: qty 1

## 2023-02-25 MED ORDER — MUPIROCIN 2 % EX OINT: TOPICAL_OINTMENT | Freq: Two times a day (BID) | CUTANEOUS | Status: DC

## 2023-02-25 NOTE — Progress Notes (Addendum)
Physical Therapy Treatment Patient Details Name: Roberta Ross MRN: [id number] DOB: March 07, 1934 Today's Date: 02/25/2023   History of Present Illness 89 y.o. female with medical history significant of chronic HFrEF with LVEF 30-35%, HTN, RA with severe finger deformity, HLD, CKD stage IIIb, PAF not on anticoagulation due to history of SAH sent from nursing home for evaluation of worsening of cough shortness of breath.     Found to be Covid +    PT Comments  Patient needs encouragement to participate. Moderate assistance required for rolling to the right in bed. Encourage routine repositioning for skin integrity. Patient declined sitting upright today but was agreeable to BLE therapeutic exercises. Activity tolerance is limited by fatigue. Continue to recommend rehabilitation <3 hours/day after this hospital stay.    If plan is discharge home, recommend the following: Assistance with cooking/housework;Assist for transportation;A lot of help with walking and/or transfers;A lot of help with bathing/dressing/bathroom   Can travel by private vehicle     No  Equipment Recommendations  None recommended by PT    Recommendations for Other Services       Precautions / Restrictions Precautions Precautions: Fall Restrictions Weight Bearing Restrictions: No     Mobility  Bed Mobility Overal bed mobility: Needs Assistance Bed Mobility: Rolling Rolling: Mod assist         General bed mobility comments: cues for task initiation and participation. patient refused to sit upright today, reporting she feels sick (does not describe further). encourage routine repositioning for skin integrity    Transfers                        Ambulation/Gait                   Stairs             Wheelchair Mobility     Tilt Bed    Modified Rankin (Stroke Patients Only)       Balance                                            Cognition Arousal:  Alert Behavior During Therapy: Flat affect Overall Cognitive Status: History of cognitive impairments - at baseline                                          Exercises General Exercises - Lower Extremity Heel Slides: AAROM, PROM, Strengthening, Both, 10 reps, Supine Hip ABduction/ADduction: PROM, AAROM, Strengthening, Both, 10 reps, Supine Straight Leg Raises: PROM, AAROM, Strengthening, Both, 10 reps, Supine Other Exercises Other Exercises: cues for exercise technique and for participation    General Comments        Pertinent Vitals/Pain Pain Assessment Pain Assessment: Faces Faces Pain Scale: Hurts a little bit Pain Location: arthritis pain in hands Pain Descriptors / Indicators: Grimacing Pain Intervention(s): Limited activity within patient's tolerance, Monitored during session    Home Living                          Prior Function            PT Goals (current goals can now be found in the care plan section) Acute Rehab PT Goals Patient Stated Goal: get  feeling better PT Goal Formulation: With patient Time For Goal Achievement: 03/01/23 Potential to Achieve Goals: Fair Progress towards PT goals: Progressing toward goals    Frequency    Min 1X/week      PT Plan      Co-evaluation              AM-PAC PT "6 Clicks" Mobility   Outcome Measure  Help needed turning from your back to your side while in a flat bed without using bedrails?: A Lot Help needed moving from lying on your back to sitting on the side of a flat bed without using bedrails?: A Lot Help needed moving to and from a bed to a chair (including a wheelchair)?: A Lot Help needed standing up from a chair using your arms (e.g., wheelchair or bedside chair)?: A Little Help needed to walk in hospital room?: Total Help needed climbing 3-5 steps with a railing? : Total 6 Click Score: 11    End of Session   Activity Tolerance: Patient limited by fatigue Patient  left: in bed;with call bell/phone within reach;with bed alarm set   PT Visit Diagnosis: Muscle weakness (generalized) (M62.81);Difficulty in walking, not elsewhere classified (R26.2);Other abnormalities of gait and mobility (R26.89);Unsteadiness on feet (R26.81)     Time: 3810-1751 PT Time Calculation (min) (ACUTE ONLY): 12 min  Charges:    $Therapeutic Exercise: 8-22 mins PT General Charges $$ ACUTE PT VISIT: 1 Visit                     Roberta Ross, PT, MPT    Ina Homes 02/25/2023, 12:18 PM

## 2023-02-25 NOTE — TOC Progression Note (Signed)
Transition of Care [facility]) - Progression Note    Patient Details  Name: Roberta Ross MRN: [id number] Date of Birth: 1933/11/22  Transition of Care [facility]) CM/SW Contact  Allena Katz, LCSW Phone Number: 02/25/2023, 12:09 PM  Clinical Narrative:   Tiffanie from [facility] reports that pt does not have a bed today but can come tomorrow.          Expected Discharge Plan and Services                                               Social Determinants of Health (SDOH) Interventions SDOH Screenings   Food Insecurity: No Food Insecurity (02/15/2023)  Housing: Low Risk  (02/15/2023)  Transportation Needs: No Transportation Needs (02/15/2023)  Utilities: Not At Risk (02/15/2023)  Tobacco Use: Medium Risk (02/22/2023)    Readmission Risk Interventions     No data to display

## 2023-02-25 NOTE — Progress Notes (Addendum)
Progress Note    Roberta Ross  QMV:[id number] DOB: [birth year]  DOA: 02/15/2023 PCP: Lauro Regulus, MD      Brief Narrative:    Medical records reviewed and are as summarized below:  Roberta Ross is a 89 y.o. female  with medical history significant of chronic HFrEF with LVEF 30-35%, HTN, RA with severe finger deformity, HLD, CKD stage IIIb, PAF not on anticoagulation due to history of SAH, who was brought from the nursing home for evaluation of productive cough and worsening shortness of breath.  Reportedly, she also had low-grade fever at the nursing home.  She was started on diagnosed with COVID-19 infection at the nursing home on 02/07/2023 and had completed 5 days of Paxlovid.  Despite finishing Paxlovid, she still continued to have productive cough, exertional shortness of breath and low-grade fever.   She was admitted to the hospital for acute on chronic systolic CHF.  She was also found to have mycoplasma pneumoniae pneumonia.   Assessment/Plan:   Principal Problem:   COVID Active Problems:   Acute on chronic systolic CHF (congestive heart failure) (HCC)   Atrial fibrillation, chronic (HCC)   COVID-19 virus infection   Mycoplasma pneumoniae pneumonia   Body mass index is 19.97 kg/m.   Acute on chronic systolic CHF: Stable.  She is tolerating torsemide. 2D echo on 10/19 showed EF estimated 25 to 30%, severely dilated left and right atria, moderately reduced RV function. No left atrial mass on 2D echo. 2D echo in April 2021 showed EF estimated at 30%.   Aortic atherosclerosis atrial fibrillation with RVR: Heart rate has improved.  Continue Cardizem.  She said patient was not taking any long-term baby aspirin prior to admission.  She wants this to be discontinued.  She is not on anticoagulation because of history of fall and intracranial bleed.   COVID-19 infection: Completed 5 days of Paxlovid prior to admission. Mycoplasma pneumoniae pneumonia:  Mycoplasma pneumonia IgM was positive (970).  Patient had already received 3 days of azithromycin (completed on 02/18/2023). She completed 5 days of Omnicef on 02/22/2023 No acute pulmonary embolism or pneumonia on CTA chest.   Constipation, abdominal discomfort: Abdominal x-ray ordered today showed small to moderate stool burden.  Continue laxatives   Thyroid nodule (1.1 x 1.7 cm): Outpatient follow-up with PCP Descending thoracic aortic aneurysm 3.8 cm: Outpatient follow-up with PCP. These findings were discussed with [name], daughter, over the phone.  She said they had mentioned enlarged aorta when she was admitted to [facility] in 2019.  On reviewing the CAT scan of chest, abdomen and pelvis at [facility] in February 2019, the report noted right thyroid lobe nodule measuring 1.2 cm but it stated that the aorta was normal in caliber.  There was also mention of prominent eccentric mural thrombus about the suprarenal abdominal aorta.  However, there was no evidence of mural thrombus on CT chest done on this admission.   COPD: Continue bronchodilators as needed   CKD stage IIIa: Creatinine is stable   General Weakness: PT recommended discharge to SNF.  Awaiting placement to SNF.  I was informed by Wayne Both, Child psychotherapist, that facility can take her tomorrow.  Plan of care was discussed with [name], daughter, over the phone.  She has been informed that [facility] nursing home does not have a bed and so cannot accept the patient today.  She understands that patient may be discharged to SNF tomorrow.    Diet Order  Diet Heart Room service appropriate? Yes; Fluid consistency: Thin  Diet effective now                            Consultants: None  Procedures: None    Medications:    diltiazem  120 mg Oral Daily   guaiFENesin  600 mg Oral BID   heparin  5,000 Units Subcutaneous Q12H   mupirocin ointment   Nasal BID   polyethylene glycol  17 g  Oral Daily   senna-docusate  2 tablet Oral BID   simvastatin  10 mg Oral QPM   torsemide  20 mg Oral QODAY   Continuous Infusions:   Anti-infectives (From admission, onward)    Start     Dose/Rate Route Frequency Ordered Stop   02/18/23 1000  cefdinir (OMNICEF) capsule 300 mg        300 mg Oral Every 12 hours 02/18/23 0855 02/22/23 2206   02/18/23 0945  azithromycin (ZITHROMAX) tablet 500 mg        500 mg Oral  Once 02/18/23 0855 02/18/23 1020   02/15/23 1330  azithromycin (ZITHROMAX) tablet 500 mg  Status:  Discontinued        500 mg Oral Daily 02/15/23 1317 02/16/23 1631   02/15/23 1245  nirmatrelvir/ritonavir (renal dosing) (PAXLOVID) 2 tablet  Status:  Discontinued        2 tablet Oral 2 times daily 02/15/23 1235 02/15/23 1303              Family Communication/Anticipated D/C date and plan/Code Status   DVT prophylaxis: heparin injection 5,000 Units Start: 02/15/23 1245     Code Status: Limited: Do not attempt resuscitation (DNR) -DNR-LIMITED -Do Not Intubate/DNI   Family Communication: None Disposition Plan: Plan to discharge to SNF   Status is: Observation The patient will require care spanning > 2 midnights and should be moved to inpatient because: General Weakness       Subjective:   Interval events noted.  She complains of constipation and abdominal discomfort.  She does not think she has moved her bowels in days.  However, chart review showed that she had 2 bowel movements yesterday.  Amber, RN, was at the bedside  Objective:    Vitals:   02/24/23 1519 02/24/23 2047 02/25/23 0427 02/25/23 0834  BP: 105/78 107/83 94/65 102/67  Pulse: 88 (!) 101 99 98  Resp: 18 18 18 16   Temp: 98 F (36.7 C) 97.9 F (36.6 C) 97.7 F (36.5 C) 98.7 F (37.1 C)  TempSrc:  Oral Oral   SpO2:  93% 94% 95%  Weight:      Height:       No data found.   Intake/Output Summary (Last 24 hours) at 02/25/2023 1647 Last data filed at 02/24/2023 1813 Gross per 24  hour  Intake 200 ml  Output 1600 ml  Net -1400 ml   Filed Weights   02/15/23 0701  Weight: 54.4 kg    Exam:  GEN: NAD SKIN: Warm and dry EYES: EOMI ENT: MMM CV: RRR PULM: CTA B ABD: soft, ND, NT, +BS CNS: AAO x 3, non focal EXT: No edema or tenderness    Data Reviewed:   I have personally reviewed following labs and imaging studies:  Labs: Labs show the following:   Basic Metabolic Panel: No results for input(s): "NA", "K", "CL", "CO2", "GLUCOSE", "BUN", "CREATININE", "CALCIUM", "MG", "PHOS" in the last 168 hours.  GFR Estimated Creatinine Clearance:  30.7 mL/min (A) (by C-G formula based on SCr of 1.13 mg/dL (H)). Liver Function Tests: No results for input(s): "AST", "ALT", "ALKPHOS", "BILITOT", "PROT", "ALBUMIN" in the last 168 hours.  No results for input(s): "LIPASE", "AMYLASE" in the last 168 hours. No results for input(s): "AMMONIA" in the last 168 hours. Coagulation profile No results for input(s): "INR", "PROTIME" in the last 168 hours.  CBC: Recent Labs  Lab 02/20/23 0403 02/23/23 0433  WBC 9.1 12.0*  HGB 11.6* 14.9  HCT 35.3* 47.4*  MCV 90.7 99.6  PLT 261 164   Cardiac Enzymes: No results for input(s): "CKTOTAL", "CKMB", "CKMBINDEX", "TROPONINI" in the last 168 hours. BNP (last 3 results) No results for input(s): "PROBNP" in the last 8760 hours. CBG: No results for input(s): "GLUCAP" in the last 168 hours. D-Dimer: No results for input(s): "DDIMER" in the last 72 hours.  Hgb A1c: No results for input(s): "HGBA1C" in the last 72 hours. Lipid Profile: No results for input(s): "CHOL", "HDL", "LDLCALC", "TRIG", "CHOLHDL", "LDLDIRECT" in the last 72 hours. Thyroid function studies: No results for input(s): "TSH", "T4TOTAL", "T3FREE", "THYROIDAB" in the last 72 hours.  Invalid input(s): "FREET3"  Anemia work up: No results for input(s): "VITAMINB12", "FOLATE", "FERRITIN", "TIBC", "IRON", "RETICCTPCT" in the last 72 hours. Sepsis  Labs: Recent Labs  Lab 02/20/23 0403 02/23/23 0433  WBC 9.1 12.0*    Microbiology Recent Results (from the past 240 hour(s))  Expectorated Sputum Assessment w Gram Stain, Rflx to Resp Cult     Status: None   Collection Time: 02/15/23 11:52 PM   Specimen: Expectorated Sputum  Result Value Ref Range Status   Specimen Description EXPECTORATED SPUTUM  Final   Special Requests NONE  Final   Sputum evaluation   Final    THIS SPECIMEN IS ACCEPTABLE FOR SPUTUM CULTURE Performed at [facility], [address] [place], [place] [address]    Report Status 02/16/2023 FINAL  Final  Culture, Respiratory w Gram Stain     Status: None   Collection Time: 02/15/23 11:52 PM  Result Value Ref Range Status   Specimen Description   Final    EXPECTORATED SPUTUM Performed at [facility], [address], [place], [place] [address]    Special Requests   Final    NONE Reflexed from [id number] Performed at [facility], [address] [place], [place] [address]    Gram Stain   Final    FEW WBC PRESENT, PREDOMINANTLY PMN FEW GRAM POSITIVE COCCI RARE GRAM NEGATIVE RODS    Culture   Final    MODERATE Consistent with normal respiratory flora. No Pseudomonas species isolated Performed at [facility], [address], [place], [place] [address]    Report Status 02/18/2023 FINAL  Final  MRSA Next Gen by PCR, Nasal     Status: Abnormal   Collection Time: 02/24/23  2:50 PM   Specimen: Nasal Mucosa; Nasal Swab  Result Value Ref Range Status   MRSA by PCR Next Gen DETECTED (A) NOT DETECTED Final    Comment: RESULT CALLED TO, READ BACK BY AND VERIFIED WITH: Alejandro Mulling 1618 02/24/2023 CP (NOTE) The GeneXpert MRSA Assay (FDA approved for NASAL specimens only), is one component of a comprehensive MRSA colonization surveillance program. It is not intended to diagnose MRSA infection nor to guide or monitor treatment for MRSA infections. Test performance is not FDA  approved in patients less than 77 years old. Performed at [facility], [address] [place],  [place] [address]     Procedures and diagnostic studies:  DG Abd 1 View  Result Date: 02/25/2023 CLINICAL DATA:  Abdominal pain.  Constipation. EXAM: ABDOMEN - 1 VIEW COMPARISON:  04/07/2020. FINDINGS: The bowel gas pattern is non-obstructive. Small-to-moderate stool burden. No evidence of pneumoperitoneum, within the limitations of a supine film. No acute osseous abnormalities. Moderate atherosclerotic vascular calcifications noted. The soft tissues are otherwise within normal limits. Surgical changes, devices, tubes and lines: Partially seen left proximal femur intramedullary nail. There are surgical clips in the right upper quadrant, typical of a previous cholecystectomy. IMPRESSION: *Nonobstructive bowel gas pattern.  Small-to-moderate stool burden. Electronically Signed   By: Jules Schick M.D.   On: 02/25/2023 10:50               LOS: 0 days   Dakiya Puopolo  [facility]   Pager on [email or web address]. If 7PM-7AM, please contact night-coverage at [email or web address]     02/25/2023, 4:47 PM

## 2023-02-26 ENCOUNTER — Observation Stay: Payer: Medicare Other

## 2023-02-26 DIAGNOSIS — R109 Unspecified abdominal pain: Secondary | ICD-10-CM | POA: Insufficient documentation

## 2023-02-26 DIAGNOSIS — R1084 Generalized abdominal pain: Secondary | ICD-10-CM | POA: Diagnosis not present

## 2023-02-26 DIAGNOSIS — U071 COVID-19: Secondary | ICD-10-CM | POA: Diagnosis not present

## 2023-02-26 LAB — CBC WITH DIFFERENTIAL/PLATELET
Abs Immature Granulocytes: 0.08 10*3/uL — ABNORMAL HIGH (ref 0.00–0.07)
Basophils Absolute: 0 10*3/uL (ref 0.0–0.1)
Basophils Relative: 0 %
Eosinophils Absolute: 0.2 10*3/uL (ref 0.0–0.5)
Eosinophils Relative: 2 %
HCT: 35.7 % — ABNORMAL LOW (ref 36.0–46.0)
Hemoglobin: 11.9 g/dL — ABNORMAL LOW (ref 12.0–15.0)
Immature Granulocytes: 1 %
Lymphocytes Relative: 9 %
Lymphs Abs: 0.9 10*3/uL (ref 0.7–4.0)
MCH: 29.8 pg (ref 26.0–34.0)
MCHC: 33.3 g/dL (ref 30.0–36.0)
MCV: 89.3 fL (ref 80.0–100.0)
Monocytes Absolute: 0.8 10*3/uL (ref 0.1–1.0)
Monocytes Relative: 8 %
Neutro Abs: 8 10*3/uL — ABNORMAL HIGH (ref 1.7–7.7)
Neutrophils Relative %: 80 %
Platelets: 278 10*3/uL (ref 150–400)
RBC: 4 MIL/uL (ref 3.87–5.11)
RDW: 12.9 % (ref 11.5–15.5)
WBC: 9.9 10*3/uL (ref 4.0–10.5)
nRBC: 0 % (ref 0.0–0.2)

## 2023-02-26 LAB — COMPREHENSIVE METABOLIC PANEL
ALT: 17 U/L (ref 0–44)
AST: 22 U/L (ref 15–41)
Albumin: 2.4 g/dL — ABNORMAL LOW (ref 3.5–5.0)
Alkaline Phosphatase: 124 U/L (ref 38–126)
Anion gap: 9 (ref 5–15)
BUN: 29 mg/dL — ABNORMAL HIGH (ref 8–23)
CO2: 24 mmol/L (ref 22–32)
Calcium: 9.6 mg/dL (ref 8.9–10.3)
Chloride: 97 mmol/L — ABNORMAL LOW (ref 98–111)
Creatinine, Ser: 1.27 mg/dL — ABNORMAL HIGH (ref 0.44–1.00)
GFR, Estimated: 41 mL/min — ABNORMAL LOW (ref >60)
Glucose, Bld: 146 mg/dL — ABNORMAL HIGH (ref 70–99)
Potassium: 3.5 mmol/L (ref 3.5–5.1)
Sodium: 130 mmol/L — ABNORMAL LOW (ref 135–145)
Total Bilirubin: 0.8 mg/dL (ref 0.3–1.2)
Total Protein: 6.3 g/dL — ABNORMAL LOW (ref 6.5–8.1)

## 2023-02-26 LAB — LACTIC ACID, PLASMA: Lactic Acid, Venous: 1.4 mmol/L (ref 0.5–1.9)

## 2023-02-26 MED ORDER — IOHEXOL 300 MG/ML  SOLN
80.0000 mL | Freq: Once | INTRAMUSCULAR | Status: AC | PRN
  Administered 2023-02-26: 80 mL via INTRAVENOUS

## 2023-02-26 MED ORDER — IOHEXOL 9 MG/ML PO SOLN
500.0000 mL | ORAL | Status: AC
  Administered 2023-02-26 (×2): 500 mL via ORAL

## 2023-02-26 MED ORDER — SODIUM CHLORIDE 0.9 % IV SOLN: INTRAVENOUS | Status: AC

## 2023-02-26 NOTE — Discharge Summary (Incomplete)
Physician Discharge Summary   Patient: Roberta Ross MRN: [id number] DOB: [birth year]  Admit date:     02/15/2023  Discharge date: 02/26/23  Discharge Physician: Lurene Shadow   PCP: Lauro Regulus, MD   Recommendations at discharge:  {Tip this will not be part of the note when signed- Example include specific recommendations for outpatient follow-up, pending tests to follow-up on. (Optional):26781}  Follow up with physician at the nursing home within 3 days of discharge  Discharge Diagnoses: Principal Problem:   COVID Active Problems:   Acute on chronic systolic CHF (congestive heart failure) (HCC)   Atrial fibrillation, chronic (HCC)   COVID-19 virus infection   Mycoplasma pneumoniae pneumonia  Resolved Problems:   * No resolved hospital problems. *  Hospital Course:  Roberta Ross is a 89 y.o. female  with medical history significant of chronic HFrEF with LVEF 30-35%, HTN, RA with severe finger deformity, HLD, CKD stage IIIb, PAF not on anticoagulation due to history of SAH, who was brought from the nursing home for evaluation of productive cough and worsening shortness of breath.  Reportedly, she also had low-grade fever at the nursing home.  She was started on diagnosed with COVID-19 infection at the nursing home on 02/07/2023 and had completed 5 days of Paxlovid.  Despite finishing Paxlovid, she still continued to have productive cough, exertional shortness of breath and low-grade fever.     She was admitted to the hospital for acute on chronic systolic CHF.  She was also found to have mycoplasma pneumoniae pneumonia.  Assessment and Plan:   Acute on chronic systolic CHF: Stable.  She is tolerating torsemide. 2D echo on 10/19 showed EF estimated 25 to 30%, severely dilated left and right atria, moderately reduced RV function. No left atrial mass on 2D echo. 2D echo in April 2021 showed EF estimated at 30%.     Aortic atherosclerosis atrial fibrillation with  RVR: Heart rate has improved.  Continue Cardizem.  She said patient was not taking any long-term baby aspirin prior to admission.  She wants this to be discontinued.  She is not on anticoagulation because of history of fall and intracranial bleed.     COVID-19 infection: Completed 5 days of Paxlovid prior to admission. Mycoplasma pneumoniae pneumonia: Mycoplasma pneumonia IgM was positive (970).  Patient had already received 3 days of azithromycin (completed on 02/18/2023). She completed 5 days of Omnicef on 02/22/2023 No acute pulmonary embolism or pneumonia on CTA chest.     Constipation, abdominal discomfort: Abdominal x-ray ordered today showed small to moderate stool burden.  Continue laxatives     Thyroid nodule (1.1 x 1.7 cm): Outpatient follow-up with PCP Descending thoracic aortic aneurysm 3.8 cm: Outpatient follow-up with PCP. These findings were discussed with Roberta Ross, daughter, over the phone.       COPD: Continue bronchodilators as needed     CKD stage IIIa: Creatinine is stable     General Weakness: PT recommended discharge to SNF.     {Tip this will not be part of the note when signed Body mass index is 19.97 kg/m. , ,  (Optional):26781}   Consultants: None  Procedures performed: None  Disposition: Home Diet recommendation:  Cardiac diet DISCHARGE MEDICATION: Allergies as of 02/26/2023       Reactions   Metoprolol Shortness Of Breath   Penicillins Other (See Comments)   Internal swelling including throat TOLERATES CEFTRIAXONE   Methotrexate Derivatives Nausea Only   Doxycycline Hyclate Rash     Med Rec must  be completed prior to using this [facility]***       Discharge Exam: Filed Weights   02/15/23 0701  Weight: 54.4 kg   GEN: NAD SKIN: Warm and dry EYES: No pallor or icterus ENT: MMM CV: RRR PULM: CTA B ABD: soft, ND, NT, +BS CNS: AAO x 3, non focal EXT: No edema or tenderness   Condition at discharge: good  The results of  significant diagnostics from this hospitalization (including imaging, microbiology, ancillary and laboratory) are listed below for reference.   Imaging Studies: DG Abd 1 View  Result Date: 02/25/2023 CLINICAL DATA:  Abdominal pain.  Constipation. EXAM: ABDOMEN - 1 VIEW COMPARISON:  04/07/2020. FINDINGS: The bowel gas pattern is non-obstructive. Small-to-moderate stool burden. No evidence of pneumoperitoneum, within the limitations of a supine film. No acute osseous abnormalities. Moderate atherosclerotic vascular calcifications noted. The soft tissues are otherwise within normal limits. Surgical changes, devices, tubes and lines: Partially seen left proximal femur intramedullary nail. There are surgical clips in the right upper quadrant, typical of a previous cholecystectomy. IMPRESSION: *Nonobstructive bowel gas pattern.  Small-to-moderate stool burden. Electronically Signed   By: Jules Schick M.D.   On: 02/25/2023 10:50   ECHOCARDIOGRAM COMPLETE  Result Date: 02/16/2023    ECHOCARDIOGRAM REPORT   Patient Name:   Roberta Ross Date of Exam: 02/16/2023 Medical Rec #:  [id number]      Height:       65.0 in Accession #:    [id number]     Weight:       120.0 lb Date of Birth:  Feb 11, 1934     BSA:          1.592 m Patient Age:    88 years       BP:           107/96 mmHg Patient Gender: F              HR:           97 bpm. Exam Location:  [facility] Procedure: 2D Echo Indications:     Atrial Fibrillation I48.91  History:         Patient has no prior history of Echocardiogram examinations.  Sonographer:     Overton Mam RDCS, FASE Referring Phys:  [id number] Emeline General Diagnosing Phys: Clotilde Dieter  Sonographer Comments: Technically challenging study due to limited acoustic windows, suboptimal subcostal window and suboptimal apical window. Image acquisition challenging due to respiratory motion and Image acquisition challenging due to uncooperative patient. Unable to complete study due to patient coughing  and in pain. Both the patient and daughter requested the test be stopped due to being uncomfortable. IMPRESSIONS  1. Left ventricular ejection fraction, by estimation, is 25 to 30%. The left ventricle has severely decreased function. The left ventricle demonstrates global hypokinesis. Left ventricular diastolic parameters are indeterminate.  2. Right ventricular systolic function is moderately reduced. The right ventricular size is moderately enlarged.  3. Left atrial size was severely dilated.  4. Right atrial size was severely dilated.  5. The mitral valve is grossly normal. Mild mitral valve regurgitation.  6. The aortic valve is grossly normal. Aortic valve regurgitation is trivial. No aortic stenosis is present. FINDINGS  Left Ventricle: Left ventricular ejection fraction, by estimation, is 25 to 30%. The left ventricle has severely decreased function. The left ventricle demonstrates global hypokinesis. The left ventricular internal cavity size was normal in size. There is no left ventricular hypertrophy. Left ventricular diastolic parameters are indeterminate. Right Ventricle:  Physician Discharge Summary   Patient: Roberta Ross MRN: [id number] DOB: [birth year]  Admit date:     02/15/2023  Discharge date: 02/26/23  Discharge Physician: Lurene Shadow   PCP: Lauro Regulus, MD   Recommendations at discharge:  {Tip this will not be part of the note when signed- Example include specific recommendations for outpatient follow-up, pending tests to follow-up on. (Optional):26781}  Follow up with physician at the nursing home within 3 days of discharge  Discharge Diagnoses: Principal Problem:   COVID Active Problems:   Acute on chronic systolic CHF (congestive heart failure) (HCC)   Atrial fibrillation, chronic (HCC)   COVID-19 virus infection   Mycoplasma pneumoniae pneumonia  Resolved Problems:   * No resolved hospital problems. *  Hospital Course:  Roberta Ross is a 89 y.o. female  with medical history significant of chronic HFrEF with LVEF 30-35%, HTN, RA with severe finger deformity, HLD, CKD stage IIIb, PAF not on anticoagulation due to history of SAH, who was brought from the nursing home for evaluation of productive cough and worsening shortness of breath.  Reportedly, she also had low-grade fever at the nursing home.  She was started on diagnosed with COVID-19 infection at the nursing home on 02/07/2023 and had completed 5 days of Paxlovid.  Despite finishing Paxlovid, she still continued to have productive cough, exertional shortness of breath and low-grade fever.     She was admitted to the hospital for acute on chronic systolic CHF.  She was also found to have mycoplasma pneumoniae pneumonia.  Assessment and Plan:   Acute on chronic systolic CHF: Stable.  She is tolerating torsemide. 2D echo on 10/19 showed EF estimated 25 to 30%, severely dilated left and right atria, moderately reduced RV function. No left atrial mass on 2D echo. 2D echo in April 2021 showed EF estimated at 30%.     Aortic atherosclerosis atrial fibrillation with  RVR: Heart rate has improved.  Continue Cardizem.  She said patient was not taking any long-term baby aspirin prior to admission.  She wants this to be discontinued.  She is not on anticoagulation because of history of fall and intracranial bleed.     COVID-19 infection: Completed 5 days of Paxlovid prior to admission. Mycoplasma pneumoniae pneumonia: Mycoplasma pneumonia IgM was positive (970).  Patient had already received 3 days of azithromycin (completed on 02/18/2023). She completed 5 days of Omnicef on 02/22/2023 No acute pulmonary embolism or pneumonia on CTA chest.     Constipation, abdominal discomfort: Abdominal x-ray ordered today showed small to moderate stool burden.  Continue laxatives     Thyroid nodule (1.1 x 1.7 cm): Outpatient follow-up with PCP Descending thoracic aortic aneurysm 3.8 cm: Outpatient follow-up with PCP. These findings were discussed with Roberta Ross, daughter, over the phone.       COPD: Continue bronchodilators as needed     CKD stage IIIa: Creatinine is stable     General Weakness: PT recommended discharge to SNF.     {Tip this will not be part of the note when signed Body mass index is 19.97 kg/m. , ,  (Optional):26781}   Consultants: None  Procedures performed: None  Disposition: Home Diet recommendation:  Cardiac diet DISCHARGE MEDICATION: Allergies as of 02/26/2023       Reactions   Metoprolol Shortness Of Breath   Penicillins Other (See Comments)   Internal swelling including throat TOLERATES CEFTRIAXONE   Methotrexate Derivatives Nausea Only   Doxycycline Hyclate Rash     Med Rec must  Physician Discharge Summary   Patient: Roberta Ross MRN: [id number] DOB: [birth year]  Admit date:     02/15/2023  Discharge date: 02/26/23  Discharge Physician: Lurene Shadow   PCP: Lauro Regulus, MD   Recommendations at discharge:  {Tip this will not be part of the note when signed- Example include specific recommendations for outpatient follow-up, pending tests to follow-up on. (Optional):26781}  Follow up with physician at the nursing home within 3 days of discharge  Discharge Diagnoses: Principal Problem:   COVID Active Problems:   Acute on chronic systolic CHF (congestive heart failure) (HCC)   Atrial fibrillation, chronic (HCC)   COVID-19 virus infection   Mycoplasma pneumoniae pneumonia  Resolved Problems:   * No resolved hospital problems. *  Hospital Course:  Roberta Ross is a 89 y.o. female  with medical history significant of chronic HFrEF with LVEF 30-35%, HTN, RA with severe finger deformity, HLD, CKD stage IIIb, PAF not on anticoagulation due to history of SAH, who was brought from the nursing home for evaluation of productive cough and worsening shortness of breath.  Reportedly, she also had low-grade fever at the nursing home.  She was started on diagnosed with COVID-19 infection at the nursing home on 02/07/2023 and had completed 5 days of Paxlovid.  Despite finishing Paxlovid, she still continued to have productive cough, exertional shortness of breath and low-grade fever.     She was admitted to the hospital for acute on chronic systolic CHF.  She was also found to have mycoplasma pneumoniae pneumonia.  Assessment and Plan:   Acute on chronic systolic CHF: Stable.  She is tolerating torsemide. 2D echo on 10/19 showed EF estimated 25 to 30%, severely dilated left and right atria, moderately reduced RV function. No left atrial mass on 2D echo. 2D echo in April 2021 showed EF estimated at 30%.     Aortic atherosclerosis atrial fibrillation with  RVR: Heart rate has improved.  Continue Cardizem.  She said patient was not taking any long-term baby aspirin prior to admission.  She wants this to be discontinued.  She is not on anticoagulation because of history of fall and intracranial bleed.     COVID-19 infection: Completed 5 days of Paxlovid prior to admission. Mycoplasma pneumoniae pneumonia: Mycoplasma pneumonia IgM was positive (970).  Patient had already received 3 days of azithromycin (completed on 02/18/2023). She completed 5 days of Omnicef on 02/22/2023 No acute pulmonary embolism or pneumonia on CTA chest.     Constipation, abdominal discomfort: Abdominal x-ray ordered today showed small to moderate stool burden.  Continue laxatives     Thyroid nodule (1.1 x 1.7 cm): Outpatient follow-up with PCP Descending thoracic aortic aneurysm 3.8 cm: Outpatient follow-up with PCP. These findings were discussed with Roberta Ross, daughter, over the phone.       COPD: Continue bronchodilators as needed     CKD stage IIIa: Creatinine is stable     General Weakness: PT recommended discharge to SNF.     {Tip this will not be part of the note when signed Body mass index is 19.97 kg/m. , ,  (Optional):26781}   Consultants: None  Procedures performed: None  Disposition: Home Diet recommendation:  Cardiac diet DISCHARGE MEDICATION: Allergies as of 02/26/2023       Reactions   Metoprolol Shortness Of Breath   Penicillins Other (See Comments)   Internal swelling including throat TOLERATES CEFTRIAXONE   Methotrexate Derivatives Nausea Only   Doxycycline Hyclate Rash     Med Rec must  Physician Discharge Summary   Patient: Roberta Ross MRN: [id number] DOB: [birth year]  Admit date:     02/15/2023  Discharge date: 02/26/23  Discharge Physician: Lurene Shadow   PCP: Lauro Regulus, MD   Recommendations at discharge:  {Tip this will not be part of the note when signed- Example include specific recommendations for outpatient follow-up, pending tests to follow-up on. (Optional):26781}  Follow up with physician at the nursing home within 3 days of discharge  Discharge Diagnoses: Principal Problem:   COVID Active Problems:   Acute on chronic systolic CHF (congestive heart failure) (HCC)   Atrial fibrillation, chronic (HCC)   COVID-19 virus infection   Mycoplasma pneumoniae pneumonia  Resolved Problems:   * No resolved hospital problems. *  Hospital Course:  Roberta Ross is a 89 y.o. female  with medical history significant of chronic HFrEF with LVEF 30-35%, HTN, RA with severe finger deformity, HLD, CKD stage IIIb, PAF not on anticoagulation due to history of SAH, who was brought from the nursing home for evaluation of productive cough and worsening shortness of breath.  Reportedly, she also had low-grade fever at the nursing home.  She was started on diagnosed with COVID-19 infection at the nursing home on 02/07/2023 and had completed 5 days of Paxlovid.  Despite finishing Paxlovid, she still continued to have productive cough, exertional shortness of breath and low-grade fever.     She was admitted to the hospital for acute on chronic systolic CHF.  She was also found to have mycoplasma pneumoniae pneumonia.  Assessment and Plan:   Acute on chronic systolic CHF: Stable.  She is tolerating torsemide. 2D echo on 10/19 showed EF estimated 25 to 30%, severely dilated left and right atria, moderately reduced RV function. No left atrial mass on 2D echo. 2D echo in April 2021 showed EF estimated at 30%.     Aortic atherosclerosis atrial fibrillation with  RVR: Heart rate has improved.  Continue Cardizem.  She said patient was not taking any long-term baby aspirin prior to admission.  She wants this to be discontinued.  She is not on anticoagulation because of history of fall and intracranial bleed.     COVID-19 infection: Completed 5 days of Paxlovid prior to admission. Mycoplasma pneumoniae pneumonia: Mycoplasma pneumonia IgM was positive (970).  Patient had already received 3 days of azithromycin (completed on 02/18/2023). She completed 5 days of Omnicef on 02/22/2023 No acute pulmonary embolism or pneumonia on CTA chest.     Constipation, abdominal discomfort: Abdominal x-ray ordered today showed small to moderate stool burden.  Continue laxatives     Thyroid nodule (1.1 x 1.7 cm): Outpatient follow-up with PCP Descending thoracic aortic aneurysm 3.8 cm: Outpatient follow-up with PCP. These findings were discussed with Roberta Ross, daughter, over the phone.       COPD: Continue bronchodilators as needed     CKD stage IIIa: Creatinine is stable     General Weakness: PT recommended discharge to SNF.     {Tip this will not be part of the note when signed Body mass index is 19.97 kg/m. , ,  (Optional):26781}   Consultants: None  Procedures performed: None  Disposition: Home Diet recommendation:  Cardiac diet DISCHARGE MEDICATION: Allergies as of 02/26/2023       Reactions   Metoprolol Shortness Of Breath   Penicillins Other (See Comments)   Internal swelling including throat TOLERATES CEFTRIAXONE   Methotrexate Derivatives Nausea Only   Doxycycline Hyclate Rash     Med Rec must  Physician Discharge Summary   Patient: Roberta Ross MRN: [id number] DOB: [birth year]  Admit date:     02/15/2023  Discharge date: 02/26/23  Discharge Physician: Lurene Shadow   PCP: Lauro Regulus, MD   Recommendations at discharge:  {Tip this will not be part of the note when signed- Example include specific recommendations for outpatient follow-up, pending tests to follow-up on. (Optional):26781}  Follow up with physician at the nursing home within 3 days of discharge  Discharge Diagnoses: Principal Problem:   COVID Active Problems:   Acute on chronic systolic CHF (congestive heart failure) (HCC)   Atrial fibrillation, chronic (HCC)   COVID-19 virus infection   Mycoplasma pneumoniae pneumonia  Resolved Problems:   * No resolved hospital problems. *  Hospital Course:  Roberta Ross is a 89 y.o. female  with medical history significant of chronic HFrEF with LVEF 30-35%, HTN, RA with severe finger deformity, HLD, CKD stage IIIb, PAF not on anticoagulation due to history of SAH, who was brought from the nursing home for evaluation of productive cough and worsening shortness of breath.  Reportedly, she also had low-grade fever at the nursing home.  She was started on diagnosed with COVID-19 infection at the nursing home on 02/07/2023 and had completed 5 days of Paxlovid.  Despite finishing Paxlovid, she still continued to have productive cough, exertional shortness of breath and low-grade fever.     She was admitted to the hospital for acute on chronic systolic CHF.  She was also found to have mycoplasma pneumoniae pneumonia.  Assessment and Plan:   Acute on chronic systolic CHF: Stable.  She is tolerating torsemide. 2D echo on 10/19 showed EF estimated 25 to 30%, severely dilated left and right atria, moderately reduced RV function. No left atrial mass on 2D echo. 2D echo in April 2021 showed EF estimated at 30%.     Aortic atherosclerosis atrial fibrillation with  RVR: Heart rate has improved.  Continue Cardizem.  She said patient was not taking any long-term baby aspirin prior to admission.  She wants this to be discontinued.  She is not on anticoagulation because of history of fall and intracranial bleed.     COVID-19 infection: Completed 5 days of Paxlovid prior to admission. Mycoplasma pneumoniae pneumonia: Mycoplasma pneumonia IgM was positive (970).  Patient had already received 3 days of azithromycin (completed on 02/18/2023). She completed 5 days of Omnicef on 02/22/2023 No acute pulmonary embolism or pneumonia on CTA chest.     Constipation, abdominal discomfort: Abdominal x-ray ordered today showed small to moderate stool burden.  Continue laxatives     Thyroid nodule (1.1 x 1.7 cm): Outpatient follow-up with PCP Descending thoracic aortic aneurysm 3.8 cm: Outpatient follow-up with PCP. These findings were discussed with Roberta Ross, daughter, over the phone.       COPD: Continue bronchodilators as needed     CKD stage IIIa: Creatinine is stable     General Weakness: PT recommended discharge to SNF.     {Tip this will not be part of the note when signed Body mass index is 19.97 kg/m. , ,  (Optional):26781}   Consultants: None  Procedures performed: None  Disposition: Home Diet recommendation:  Cardiac diet DISCHARGE MEDICATION: Allergies as of 02/26/2023       Reactions   Metoprolol Shortness Of Breath   Penicillins Other (See Comments)   Internal swelling including throat TOLERATES CEFTRIAXONE   Methotrexate Derivatives Nausea Only   Doxycycline Hyclate Rash     Med Rec must

## 2023-02-26 NOTE — Progress Notes (Signed)
PT Cancellation Note  Patient Details Name: Roberta Ross MRN: [id number] DOB: 1934/02/24   Cancelled Treatment:    Reason Eval/Treat Not Completed: Other (comment) (Patient refused PT despite encouragement. She reports finally feeling better and prefers to stay in bed at this time. PT will continue with attempts.)  Donna Bernard, PT, MPT   Ina Homes 02/26/2023, 1:05 PM

## 2023-02-26 NOTE — Progress Notes (Addendum)
Progress Note    Roberta Ross  UEA:[id number] DOB: [birth year]  DOA: 02/15/2023 PCP: Lauro Regulus, MD      Brief Narrative:    Medical records reviewed and are as summarized below:  Roberta Ross is a 89 y.o. female  with medical history significant of chronic HFrEF with LVEF 30-35%, HTN, RA with severe finger deformity, HLD, CKD stage IIIb, PAF not on anticoagulation due to history of SAH, who was brought from the nursing home for evaluation of productive cough and worsening shortness of breath.  Reportedly, she also had low-grade fever at the nursing home.  She was started on diagnosed with COVID-19 infection at the nursing home on 02/07/2023 and had completed 5 days of Paxlovid.  Despite finishing Paxlovid, she still continued to have productive cough, exertional shortness of breath and low-grade fever.   She was admitted to the hospital for acute on chronic systolic CHF.  She was also found to have mycoplasma pneumoniae pneumonia.   Assessment/Plan:   Principal Problem:   COVID Active Problems:   Acute on chronic systolic CHF (congestive heart failure) (HCC)   Atrial fibrillation, chronic (HCC)   COVID-19 virus infection   Mycoplasma pneumoniae pneumonia   Abdominal pain   Body mass index is 19.97 kg/m.   Acute on chronic systolic CHF: Stable.  She is tolerating torsemide. 2D echo on 10/19 showed EF estimated 25 to 30%, severely dilated left and right atria, moderately reduced RV function. No left atrial mass on 2D echo. 2D echo in April 2021 showed EF estimated at 30%.   Aortic atherosclerosis atrial fibrillation with RVR: Heart rate has improved.  Continue Cardizem.  Roberta Ross (daughter) said patient was not taking any long-term baby aspirin prior to admission.  She wants this to be discontinued.  She is not on anticoagulation because of history of fall and intracranial bleed.   COVID-19 infection: Completed 5 days of Paxlovid prior to  admission. Mycoplasma pneumoniae pneumonia: Mycoplasma pneumonia IgM was positive (970).  Patient had already received 3 days of azithromycin (completed on 02/18/2023). She completed 5 days of Omnicef on 02/22/2023 No acute pulmonary embolism or pneumonia on CTA chest.   Severe abdominal pain with significant tenderness: It is not clear whether abdominal pain and tenderness are from recent diarrhea from laxatives.  CT abdomen and pelvis with contrast will be obtained for further evaluation to make sure there is nothing acute going on before she can be discharged back to the nursing facility.  Plan was discussed in detail with patient and she wants to find out what is going on.  Plan was also discussed with Roberta Ross, daughter, over the phone.  She is agreeable to CT abdomen pelvis with contrast.  They understand that patient has impaired kidney function and IV contrast can sometimes be harmful to the kidneys. IV normal saline ordered for hydration for IV contrast.   Constipation: Improved after use of laxatives.  No fluids abdominal x-ray on 02/25/2023 showed small to moderate stool burden.   Thyroid nodule (1.1 x 1.7 cm): Outpatient follow-up with PCP Descending thoracic aortic aneurysm 3.8 cm: Outpatient follow-up with PCP. These findings were discussed with Roberta Ross, daughter, over the phone.  She said they had mentioned enlarged aorta when she was admitted to [facility] in 2019.  On reviewing the CAT scan of chest, abdomen and pelvis at [facility] in February 2019, the report noted right thyroid lobe nodule measuring 1.2 cm but it stated that the aorta was normal  in caliber.  There was also mention of prominent eccentric mural thrombus about the suprarenal abdominal aorta.  However, there was no evidence of mural thrombus on CT chest done on this admission.   COPD: Continue bronchodilators as needed   CKD stage IIIa: Creatinine is stable   General Weakness: PT recommended discharge to  SNF.    Plan to discharge patient to SNF tomorrow if CT abdomen pelvis does not show any acute abnormality.  Patient and daughter agreeable with the plan.     ADDENDUM:  CT abd/pelvis showed marked distension of the urinary bladder otherwise no acute abnormality noted.  Bladder scan done showed 999+ mls of urine.  In and out urethral catheterization was ordered for bladder drainage.  I called Roberta Ross, daughter, to update her but there was no response.     Diet Order             Diet Heart Room service appropriate? Yes; Fluid consistency: Thin  Diet effective now                            Consultants: None  Procedures: None    Medications:    diltiazem  120 mg Oral Daily   guaiFENesin  600 mg Oral BID   heparin  5,000 Units Subcutaneous Q12H   mupirocin ointment   Nasal BID   polyethylene glycol  17 g Oral Daily   senna-docusate  2 tablet Oral BID   simvastatin  10 mg Oral QPM   torsemide  20 mg Oral QODAY   Continuous Infusions:   Anti-infectives (From admission, onward)    Start     Dose/Rate Route Frequency Ordered Stop   02/18/23 1000  cefdinir (OMNICEF) capsule 300 mg        300 mg Oral Every 12 hours 02/18/23 0855 02/22/23 2206   02/18/23 0945  azithromycin (ZITHROMAX) tablet 500 mg        500 mg Oral  Once 02/18/23 0855 02/18/23 1020   02/15/23 1330  azithromycin (ZITHROMAX) tablet 500 mg  Status:  Discontinued        500 mg Oral Daily 02/15/23 1317 02/16/23 1631   02/15/23 1245  nirmatrelvir/ritonavir (renal dosing) (PAXLOVID) 2 tablet  Status:  Discontinued        2 tablet Oral 2 times daily 02/15/23 1235 02/15/23 1303              Family Communication/Anticipated D/C date and plan/Code Status   DVT prophylaxis: heparin injection 5,000 Units Start: 02/15/23 1245     Code Status: Limited: Do not attempt resuscitation (DNR) -DNR-LIMITED -Do Not Intubate/DNI   Family Communication: None Disposition Plan: Plan to discharge  to SNF   Status is: Observation The patient will require care spanning > 2 midnights and should be moved to inpatient because: General Weakness       Subjective:   Interval events noted.  Patient complains "I don't feel good".  When asked for specifics she replied "I just don't feel good".  In review of systems, patient admitted to having severe abdominal pain.  She said she was not sure whether she had moved her bowels or not.  No vomiting.  Chart review showed that patient had multiple bowel movements yesterday.  Objective:    Vitals:   02/25/23 1959 02/26/23 0531 02/26/23 0743 02/26/23 0927  BP: 98/70 110/70 101/66 96/72  Pulse: 62 94 90 85  Resp: 18 17 16    Temp:  98 F (36.7 C) 98.2 F (36.8 C) 97.6 F (36.4 C)   TempSrc: Oral Oral    SpO2: 97% 99% 100%   Weight:      Height:       No data found.   Intake/Output Summary (Last 24 hours) at 02/26/2023 1714 Last data filed at 02/26/2023 1522 Gross per 24 hour  Intake 508.04 ml  Output --  Net 508.04 ml   Filed Weights   02/15/23 0701  Weight: 54.4 kg    Exam:  GEN: NAD SKIN: Warm and dry EYES: No pallor or icterus ENT: MMM CV: RRR PULM: CTA B ABD: soft, no obvious abdominal distention, significant to mid abdominal tenderness but no rebound tenderness or guarding, +BS CNS: AAO x 3, non focal EXT: No edema or tenderness     Data Reviewed:   I have personally reviewed following labs and imaging studies:  Labs: Labs show the following:   Basic Metabolic Panel: Recent Labs  Lab 02/25/23 1715 02/26/23 1012  NA 130* 130*  K 3.6 3.5  CL 96* 97*  CO2 25 24  GLUCOSE 121* 146*  BUN 30* 29*  CREATININE 1.23* 1.27*  CALCIUM 9.6 9.6    GFR Estimated Creatinine Clearance: 27.3 mL/min (A) (by C-G formula based on SCr of 1.27 mg/dL (H)). Liver Function Tests: Recent Labs  Lab 02/26/23 1012  AST 22  ALT 17  ALKPHOS 124  BILITOT 0.8  PROT 6.3*  ALBUMIN 2.4*    No results for input(s):  "LIPASE", "AMYLASE" in the last 168 hours. No results for input(s): "AMMONIA" in the last 168 hours. Coagulation profile No results for input(s): "INR", "PROTIME" in the last 168 hours.  CBC: Recent Labs  Lab 02/20/23 0403 02/23/23 0433 02/26/23 1012  WBC 9.1 12.0* 9.9  NEUTROABS  --   --  8.0*  HGB 11.6* 14.9 11.9*  HCT 35.3* 47.4* 35.7*  MCV 90.7 99.6 89.3  PLT 261 164 278   Cardiac Enzymes: No results for input(s): "CKTOTAL", "CKMB", "CKMBINDEX", "TROPONINI" in the last 168 hours. BNP (last 3 results) No results for input(s): "PROBNP" in the last 8760 hours. CBG: No results for input(s): "GLUCAP" in the last 168 hours. D-Dimer: No results for input(s): "DDIMER" in the last 72 hours.  Hgb A1c: No results for input(s): "HGBA1C" in the last 72 hours. Lipid Profile: No results for input(s): "CHOL", "HDL", "LDLCALC", "TRIG", "CHOLHDL", "LDLDIRECT" in the last 72 hours. Thyroid function studies: No results for input(s): "TSH", "T4TOTAL", "T3FREE", "THYROIDAB" in the last 72 hours.  Invalid input(s): "FREET3"  Anemia work up: No results for input(s): "VITAMINB12", "FOLATE", "FERRITIN", "TIBC", "IRON", "RETICCTPCT" in the last 72 hours. Sepsis Labs: Recent Labs  Lab 02/20/23 0403 02/23/23 0433 02/26/23 1012  WBC 9.1 12.0* 9.9  LATICACIDVEN  --   --  1.4    Microbiology Recent Results (from the past 240 hour(s))  MRSA Next Gen by PCR, Nasal     Status: Abnormal   Collection Time: 02/24/23  2:50 PM   Specimen: Nasal Mucosa; Nasal Swab  Result Value Ref Range Status   MRSA by PCR Next Gen DETECTED (A) NOT DETECTED Final    Comment: RESULT CALLED TO, READ BACK BY AND VERIFIED WITH: Roberta Ross 1618 02/24/2023 CP (NOTE) The GeneXpert MRSA Assay (FDA approved for NASAL specimens only), is one component of a comprehensive MRSA colonization surveillance program. It is not intended to diagnose MRSA infection nor to guide or monitor treatment for MRSA  infections. Test performance  is not FDA approved in patients less than 10 years old. Performed at [facility], [address] [place], [place] [address]     Procedures and diagnostic studies:  DG Abd 1 View  Result Date: 02/25/2023 CLINICAL DATA:  Abdominal pain.  Constipation. EXAM: ABDOMEN - 1 VIEW COMPARISON:  04/07/2020. FINDINGS: The bowel gas pattern is non-obstructive. Small-to-moderate stool burden. No evidence of pneumoperitoneum, within the limitations of a supine film. No acute osseous abnormalities. Moderate atherosclerotic vascular calcifications noted. The soft tissues are otherwise within normal limits. Surgical changes, devices, tubes and lines: Partially seen left proximal femur intramedullary nail. There are surgical clips in the right upper quadrant, typical of a previous cholecystectomy. IMPRESSION: *Nonobstructive bowel gas pattern.  Small-to-moderate stool burden. Electronically Signed   By: Jules Schick M.D.   On: 02/25/2023 10:50               LOS: 0 days   Roberta Ross  [facility]   Pager on [email or web address]. If 7PM-7AM, please contact night-coverage at [email or web address]     02/26/2023, 5:14 PM

## 2023-02-27 DIAGNOSIS — U071 COVID-19: Secondary | ICD-10-CM | POA: Diagnosis not present

## 2023-02-27 LAB — BASIC METABOLIC PANEL
Anion gap: 10 (ref 5–15)
BUN: 23 mg/dL (ref 8–23)
CO2: 23 mmol/L (ref 22–32)
Calcium: 9.8 mg/dL (ref 8.9–10.3)
Chloride: 98 mmol/L (ref 98–111)
Creatinine, Ser: 0.99 mg/dL (ref 0.44–1.00)
GFR, Estimated: 56 mL/min — ABNORMAL LOW (ref >60)
Glucose, Bld: 96 mg/dL (ref 70–99)
Potassium: 3.7 mmol/L (ref 3.5–5.1)
Sodium: 131 mmol/L — ABNORMAL LOW (ref 135–145)

## 2023-02-27 NOTE — TOC Progression Note (Signed)
Transition of Care [facility]) - Progression Note    Patient Details  Name: Quanisha Piasecki MRN: [id number] Date of Birth: 04/12/1934  Transition of Care [facility]) CM/SW Contact  Allena Katz, LCSW Phone Number: 02/27/2023, 2:23 PM  Clinical Narrative:    Insurance requesting a PT/OT note showing pt able to fully at least attempt transfers ambulation and clarification of patients PLOF with transfers and ambulation. PT notified.         Expected Discharge Plan and Services                                               Social Determinants of Health (SDOH) Interventions SDOH Screenings   Food Insecurity: No Food Insecurity (02/15/2023)  Housing: Low Risk  (02/15/2023)  Transportation Needs: No Transportation Needs (02/15/2023)  Utilities: Not At Risk (02/15/2023)  Tobacco Use: Medium Risk (02/22/2023)    Readmission Risk Interventions     No data to display

## 2023-02-27 NOTE — Progress Notes (Signed)
Physical Therapy Treatment Patient Details Name: Roberta Ross MRN: [id number] DOB: 11/01/1933 Today's Date: 02/27/2023   History of Present Illness 89 y.o. female with medical history significant of chronic HFrEF with LVEF 30-35%, HTN, RA with severe finger deformity, HLD, CKD stage IIIb, PAF not on anticoagulation due to history of SAH sent from nursing home for evaluation of worsening of cough shortness of breath.     Found to be Covid +    PT Comments  Patient received in bed, sleeping. She wakes to my voice. She is agreeable to PT session, pleasant throughout. Requires min/mod A for supine to sit, increased time needed. Pain in B hands and legs due to arthritis. Patient demonstrates good sitting balance once on edge of bed. She requires mod A to stand with RW at edge of bed. Patient is fearful and weak and was only able to stand a few seconds before returning to sitting down. Patient then requested bed pan. Attempted to encourage her to transfer to [facility] but she declined. Patient will continue to benefit from skilled PT to improve strength, endurance and functional mobility.       If plan is discharge home, recommend the following: A lot of help with walking and/or transfers;A lot of help with bathing/dressing/bathroom;Assistance with cooking/housework;Assist for transportation   Can travel by private vehicle     No  Equipment Recommendations  Rolling walker (2 wheels)    Recommendations for Other Services       Precautions / Restrictions Precautions Precautions: Fall Restrictions Weight Bearing Restrictions: No     Mobility  Bed Mobility Overal bed mobility: Needs Assistance Bed Mobility: Supine to Sit, Sit to Supine Rolling: Mod assist   Supine to sit: Min assist, HOB elevated, Used rails Sit to supine: Mod assist   General bed mobility comments: patient requires min A for getting toward edge of bed, but to get feet out far enough to reach floor she needed mod/max A using  bed pad    Transfers Overall transfer level: Needs assistance Equipment used: Rolling walker (2 wheels) Transfers: Sit to/from Stand Sit to Stand: Mod assist           General transfer comment: mod A for sit to stand transfer. Patient is fearful with standing and weak. Unable to attempt ambulation or pivot this session. She did not want to get into recliner states it is uncomfortable    Ambulation/Gait               General Gait Details: feeling weak and unable to attempt   Stairs             Wheelchair Mobility     Tilt Bed    Modified Rankin (Stroke Patients Only)       Balance Overall balance assessment: Needs assistance Sitting-balance support: Feet supported Sitting balance-Leahy Scale: Good     Standing balance support: Bilateral upper extremity supported, During functional activity, Reliant on assistive device for balance Standing balance-Leahy Scale: Fair Standing balance comment: reliant on walker, poor standing tolerance but no overt LOBs with limited standing                            Cognition Arousal: Alert Behavior During Therapy: Flat affect Overall Cognitive Status: History of cognitive impairments - at baseline  General Comments: Patient is agreeable to PT session        Exercises      General Comments        Pertinent Vitals/Pain Pain Assessment Pain Assessment: Faces Faces Pain Scale: Hurts a little bit Pain Location: arthritis pain in hands, tender in B LEs Pain Descriptors / Indicators: Grimacing, Discomfort Pain Intervention(s): Monitored during session, Repositioned    Home Living                          Prior Function            PT Goals (current goals can now be found in the care plan section) Acute Rehab PT Goals Patient Stated Goal: get feeling better PT Goal Formulation: With patient Time For Goal Achievement: 03/01/23 Potential  to Achieve Goals: Fair Progress towards PT goals: Progressing toward goals    Frequency    Min 1X/week      PT Plan      Co-evaluation              AM-PAC PT "6 Clicks" Mobility   Outcome Measure  Help needed turning from your back to your side while in a flat bed without using bedrails?: A Lot Help needed moving from lying on your back to sitting on the side of a flat bed without using bedrails?: A Lot Help needed moving to and from a bed to a chair (including a wheelchair)?: A Lot Help needed standing up from a chair using your arms (e.g., wheelchair or bedside chair)?: A Lot Help needed to walk in hospital room?: Total Help needed climbing 3-5 steps with a railing? : Total 6 Click Score: 10    End of Session   Activity Tolerance: Patient limited by fatigue Patient left: in bed;with call bell/phone within reach;with bed alarm set Nurse Communication: Mobility status;Other (comment) (patient requesting bed pan) PT Visit Diagnosis: Muscle weakness (generalized) (M62.81);Other abnormalities of gait and mobility (R26.89);Pain Pain - Right/Left:  (Bilateral) Pain - part of body: Leg;Hand     Time: 1440-1455 PT Time Calculation (min) (ACUTE ONLY): 15 min  Charges:    $Therapeutic Activity: 8-22 mins PT General Charges $$ ACUTE PT VISIT: 1 Visit                     Fintan Grater, PT, GCS 02/27/23,3:05 PM

## 2023-02-27 NOTE — TOC Progression Note (Signed)
Transition of Care [facility]) - Progression Note    Patient Details  Name: Kaileen Buntain MRN: [id number] Date of Birth: May 30, 1933  Transition [facility] Care [facility]) CM/SW Contact  Allena Katz, LCSW Phone Number: 02/27/2023, 10:42 AM  Clinical Narrative:   Berkley Harvey restarted for [employer] for today.         Expected Discharge Plan and Services                                               Social Determinants of Health (SDOH) Interventions SDOH Screenings   Food Insecurity: No Food Insecurity (02/15/2023)  Housing: Low Risk  (02/15/2023)  Transportation Needs: No Transportation Needs (02/15/2023)  Utilities: Not At Risk (02/15/2023)  Tobacco Use: Medium Risk (02/22/2023)    Readmission Risk Interventions     No data to display

## 2023-02-27 NOTE — Progress Notes (Signed)
[facility]  -  at [facility]   PATIENT NAME: Roberta Ross    MR#:  [id number]  DATE OF BIRTH:  07/29/1933  SUBJECTIVE:  patient seen earlier laying in bed comfortably denies any abdominal pain. CT scan showed bladder distention had in and out done yesterday with 2 L removed. Spoke with daughter Roberta Ross at bedside. Patient did have retention noted more than 800 mL or bladder scan today. Discussed with daughter to place Foley catheter about 1300 mL output was noted. Patient is retaining urine.    VITALS:  Blood pressure 104/73, pulse 89, temperature (!) 97.4 F (36.3 C), resp. rate 18, height 5\' 5"  (1.651 m), weight 54.4 kg, SpO2 97%.  PHYSICAL EXAMINATION:   GENERAL:  89 y.o.-year-old patient with no acute distress.  LUNGS: Normal breath sounds bilaterally, no wheezing CARDIOVASCULAR: S1, S2 normal. No murmur   ABDOMEN: Soft, nontender, nondistended. Bowel sounds present. Foley+ EXTREMITIES: No  edema b/l.    NEUROLOGIC: nonfocal  patient is alert and awake   LABORATORY PANEL:  CBC Recent Labs  Lab 02/26/23 1012  WBC 9.9  HGB 11.9*  HCT 35.7*  PLT 278    Chemistries  Recent Labs  Lab 02/26/23 1012 02/27/23 0410  NA 130* 131*  K 3.5 3.7  CL 97* 98  CO2 24 23  GLUCOSE 146* 96  BUN 29* 23  CREATININE 1.27* 0.99  CALCIUM 9.6 9.8  AST 22  --   ALT 17  --   ALKPHOS 124  --   BILITOT 0.8  --    Cardiac Enzymes No results for input(s): "TROPONINI" in the last 168 hours. RADIOLOGY:  CT ABDOMEN PELVIS W CONTRAST  Result Date: 02/26/2023 CLINICAL DATA:  Abdominal pain, fever, pneumonia EXAM: CT ABDOMEN AND PELVIS WITH CONTRAST TECHNIQUE: Multidetector CT imaging of the abdomen and pelvis was performed using the standard protocol following bolus administration of intravenous contrast. RADIATION DOSE REDUCTION: This exam was performed according to the departmental dose-optimization program which includes automated exposure control, adjustment  of the mA and/or kV according to patient size and/or use of iterative reconstruction technique. CONTRAST:  80mL OMNIPAQUE IOHEXOL 300 MG/ML  SOLN COMPARISON:  02/15/2023, 02/16/2023, 01/21/2022 FINDINGS: Lower chest: Trace left pleural effusion. Marked dilatation of the left atrium and calcification of the mitral annulus again noted. Hepatobiliary: Cholecystectomy. Pneumobilia is likely due to prior instrumentation or incompetent sphincter. Liver is unremarkable. Pancreas: Unremarkable. No pancreatic ductal dilatation or surrounding inflammatory changes. Spleen: Normal in size without focal abnormality. Adrenals/Urinary Tract: Stable appearance of the kidneys. No urinary tract calculi or obstructive uropathy. There is marked distension of the urinary bladder, without filling defect. Stable nonspecific bilateral adrenal thickening. Stomach/Bowel: No bowel obstruction or ileus. Diverticulosis throughout the distal colon without evidence of acute diverticulitis. No bowel wall thickening or inflammatory change. Vascular/Lymphatic: Stable ectasia and atherosclerosis of the thoracic aorta, with 3.6 cm descending thoracic aortic aneurysm unchanged. Diffuse aortic atherosclerosis. No pathologic adenopathy. Reproductive: Status post hysterectomy. No adnexal masses. Other: No free fluid or free intraperitoneal gas. No abdominal wall hernia. Musculoskeletal: No acute or destructive bony abnormalities. Prior left hip ORIF. Reconstructed images demonstrate no additional findings. IMPRESSION: 1. Marked distension of the urinary bladder. 2. Distal colonic diverticulosis without diverticulitis. 3. Stable 3.6 cm descending thoracic aortic aneurysm. Stable atherosclerosis throughout the thoracoabdominal aorta. 4. Trace left pleural effusion. Electronically Signed   By: Sharlet Salina M.D.   On: 02/26/2023 17:26    Assessment and Plan  Roberta Ross is a  89 y.o. female  with medical history significant of chronic HFrEF with LVEF  30-35%, HTN, RA with severe finger deformity, HLD, CKD stage IIIb, PAF not on anticoagulation due to history of SAH, who was brought from the nursing home for evaluation of productive cough and worsening shortness of breath.  Reportedly, she also had low-grade fever at the nursing home.  She was started on diagnosed with COVID-19 infection at the nursing home on 02/07/2023 and had completed 5 days of Paxlovid.   Acute on chronic systolic CHF: -- Stable.  She is tolerating torsemide. 2--D echo on 10/19 showed EF estimated 25 to 30%, severely dilated left and right atria, moderately reduced RV function. --No left atrial mass on 2D echo. --2D echo in April 2021 showed EF estimated at 30%.    Aortic atherosclerosis atrial fibrillation with RVR: -- Heart rate has improved.  Continue Cardizem.  --Roberta Ross (daughter) said patient was not taking any long-term baby aspirin prior to admission.  She wants this to be discontinued.  She is not on anticoagulation because of history of fall and intracranial bleed.    COVID-19 infection: Completed 5 days of Paxlovid prior to admission. Mycoplasma pneumoniae pneumonia: Mycoplasma pneumonia IgM was positive (970).  Patient had already received 3 days of azithromycin (completed on 02/18/2023). --She completed 5 days of Omnicef on 02/22/2023 --No acute pulmonary embolism or pneumonia on CTA chest.   Acute urinary retention as noted on CT abdomen -- patient had in and out catheter yesterday with 2000 mL output -- bladder scan today showed more than 800 mL. Foley catheter placed after discussion with daughter Roberta Ross. Patient had about 1300 mL output. -- Spoke with urology. Patient has appointment for voiding trial as outpatient.  Constipation: Improved after use of laxatives.  No fluids abdominal x-ray on 02/25/2023 showed small to moderate stool burden.    Thyroid nodule (1.1 x 1.7 cm): Outpatient follow-up with PCP Descending thoracic aortic aneurysm 3.8 cm:  Outpatient follow-up with PCP. These findings were discussed with Roberta Ross, daughter, over the phone.     COPD: Continue bronchodilators as needed    CKD stage IIIa: Creatinine is stable   General Weakness: PT recommended discharge to SNF.     Procedures: Family communication :donna on the phone Consults :none CODE STATUS: DNR DVT Prophylaxis : heparin  Waiting for insurance authorization    TOTAL TIME TAKING CARE OF THIS PATIENT: 35 minutes.  >50% time spent on counselling and coordination of care  Note: This dictation was prepared with Dragon dictation along with smaller phrase technology. Any transcriptional errors that result from this process are unintentional.  Enedina Finner M.D    [facility]   CC: Primary care physician; Lauro Regulus, MD

## 2023-02-27 NOTE — Plan of Care (Signed)
  Problem: Education: Goal: Knowledge of risk factors and measures for prevention of condition will improve Outcome: Progressing   Problem: Respiratory: Goal: Will maintain a patent airway Outcome: Progressing   Problem: Health Behavior/Discharge Planning: Goal: Ability to manage health-related needs will improve Outcome: Progressing   Problem: Activity: Goal: Risk for activity intolerance will decrease Outcome: Progressing

## 2023-02-28 DIAGNOSIS — U071 COVID-19: Secondary | ICD-10-CM | POA: Diagnosis not present

## 2023-02-28 MED ORDER — CHLORHEXIDINE GLUCONATE CLOTH 2 % EX PADS
6.0000 | MEDICATED_PAD | Freq: Every day | CUTANEOUS | Status: DC
Start: 2023-02-28 — End: 2023-03-01
  Administered 2023-02-28 – 2023-03-01 (×2): 6 via TOPICAL

## 2023-02-28 NOTE — Progress Notes (Signed)
[facility]  - [place] at [facility]   PATIENT NAME: Roberta Ross    MR#:  [id number]  DATE OF BIRTH:  Nov 18, 1933  SUBJECTIVE:  patient seen earlier laying in bed comfortably denies any abdominal pain.  Wanted only icecream for BF Spoke with dter [name] on the phone and updated   VITALS:  Blood pressure 97/62, pulse 79, temperature 98.6 F (37 C), temperature source Oral, resp. rate 18, height 5\' 5"  (1.651 m), weight 54.4 kg, SpO2 95%.  PHYSICAL EXAMINATION:   GENERAL:  89 y.o.-year-old patient with no acute distress.  LUNGS: Normal breath sounds bilaterally, no wheezing CARDIOVASCULAR: S1, S2 normal. No murmur   ABDOMEN: Soft, nontender, nondistended.  Foley+ EXTREMITIES: severe DJD in extremities NEUROLOGIC: nonfocal  patient is alert and awake, weak  LABORATORY PANEL:  CBC Recent Labs  Lab 02/26/23 1012  WBC 9.9  HGB 11.9*  HCT 35.7*  PLT 278    Chemistries  Recent Labs  Lab 02/26/23 1012 02/27/23 0410  NA 130* 131*  K 3.5 3.7  CL 97* 98  CO2 24 23  GLUCOSE 146* 96  BUN 29* 23  CREATININE 1.27* 0.99  CALCIUM 9.6 9.8  AST 22  --   ALT 17  --   ALKPHOS 124  --   BILITOT 0.8  --    Cardiac Enzymes No results for input(s): "TROPONINI" in the last 168 hours. RADIOLOGY:  CT ABDOMEN PELVIS W CONTRAST  Result Date: 02/26/2023 CLINICAL DATA:  Abdominal pain, fever, pneumonia EXAM: CT ABDOMEN AND PELVIS WITH CONTRAST TECHNIQUE: Multidetector CT imaging of the abdomen and pelvis was performed using the standard protocol following bolus administration of intravenous contrast. RADIATION DOSE REDUCTION: This exam was performed according to the departmental dose-optimization program which includes automated exposure control, adjustment of the mA and/or kV according to patient size and/or use of iterative reconstruction technique. CONTRAST:  80mL OMNIPAQUE IOHEXOL 300 MG/ML  SOLN COMPARISON:  02/15/2023, 02/16/2023, 01/21/2022 FINDINGS: Lower chest:  Trace left pleural effusion. Marked dilatation of the left atrium and calcification of the mitral annulus again noted. Hepatobiliary: Cholecystectomy. Pneumobilia is likely due to prior instrumentation or incompetent sphincter. Liver is unremarkable. Pancreas: Unremarkable. No pancreatic ductal dilatation or surrounding inflammatory changes. Spleen: Normal in size without focal abnormality. Adrenals/Urinary Tract: Stable appearance of the kidneys. No urinary tract calculi or obstructive uropathy. There is marked distension of the urinary bladder, without filling defect. Stable nonspecific bilateral adrenal thickening. Stomach/Bowel: No bowel obstruction or ileus. Diverticulosis throughout the distal colon without evidence of acute diverticulitis. No bowel wall thickening or inflammatory change. Vascular/Lymphatic: Stable ectasia and atherosclerosis of the thoracic aorta, with 3.6 cm descending thoracic aortic aneurysm unchanged. Diffuse aortic atherosclerosis. No pathologic adenopathy. Reproductive: Status post hysterectomy. No adnexal masses. Other: No free fluid or free intraperitoneal gas. No abdominal wall hernia. Musculoskeletal: No acute or destructive bony abnormalities. Prior left hip ORIF. Reconstructed images demonstrate no additional findings. IMPRESSION: 1. Marked distension of the urinary bladder. 2. Distal colonic diverticulosis without diverticulitis. 3. Stable 3.6 cm descending thoracic aortic aneurysm. Stable atherosclerosis throughout the thoracoabdominal aorta. 4. Trace left pleural effusion. Electronically Signed   By: Sharlet Salina M.D.   On: 02/26/2023 17:26    Assessment and Plan  Roberta Ross is a 88 y.o. female  with medical history significant of chronic HFrEF with LVEF 30-35%, HTN, RA with severe finger deformity, HLD, CKD stage IIIb, PAF not on anticoagulation due to history of SAH, who was brought from the nursing home  for evaluation of productive cough and worsening shortness of  breath.  Reportedly, she also had low-grade fever at the nursing home.  She was started on diagnosed with COVID-19 infection at the nursing home on 02/07/2023 and had completed 5 days of Paxlovid.   Acute on chronic systolic CHF: -- Stable.  She is tolerating torsemide. 2--D echo on 10/19 showed EF estimated 25 to 30%, severely dilated left and right atria, moderately reduced RV function. --No left atrial mass on 2D echo. --2D echo in April 2021 showed EF estimated at 30%.    Aortic atherosclerosis atrial fibrillation with RVR: -- Heart rate has improved.  Continue Cardizem.  --[name] (daughter) said patient was not taking any long-term baby aspirin prior to admission.  She wants this to be discontinued.  She is not on anticoagulation because of history of fall and intracranial bleed.    COVID-19 infection: Completed 5 days of Paxlovid prior to admission. Mycoplasma pneumoniae pneumonia: Mycoplasma pneumonia IgM was positive (970).  Patient had already received 3 days of azithromycin (completed on 02/18/2023). --She completed 5 days of Omnicef on 02/22/2023 --No acute pulmonary embolism or pneumonia on CTA chest.   Acute urinary retention as noted on CT abdomen -- patient had in and out catheter yesterday with 2000 mL output -- bladder scan today showed more than 800 mL. Foley catheter placed after discussion with daughter [name]. Patient had about 1300 mL output. -- Spoke with urology. Patient has appointment for voiding trial as outpatient.  Constipation: Improved after use of laxatives.  No fluids abdominal x-ray on 02/25/2023 showed small to moderate stool burden.    Thyroid nodule (1.1 x 1.7 cm): Outpatient follow-up with PCP Descending thoracic aortic aneurysm 3.8 cm: Outpatient follow-up with PCP. These findings were discussed with [name], daughter, over the phone.     COPD: Continue bronchodilators as needed    CKD stage IIIa: Creatinine is stable   General Weakness: PT  recommended discharge to SNF.   Family communication :donna on the phone Consults :none CODE STATUS: DNR DVT Prophylaxis : heparin  Waiting for insurance authorization!    TOTAL TIME TAKING CARE OF THIS PATIENT: 35 minutes.  >50% time spent on counselling and coordination of care  Note: This dictation was prepared with Dragon dictation along with smaller phrase technology. Any transcriptional errors that result from this process are unintentional.  Enedina Finner M.D    [facility]   CC: Primary care physician; Lauro Regulus, MD

## 2023-02-28 NOTE — TOC Progression Note (Signed)
Transition of Care [facility]) - Progression Note    Patient Details  Name: Roberta Ross MRN: [id number] Date of Birth: 02/26/1934  Transition of Care [facility] CM/SW Contact  Allena Katz, LCSW Phone Number: 02/28/2023, 8:45 AM  Clinical Narrative:     [facility] still pending. Updated PT note uploaded.        Expected Discharge Plan and Services                                               Social Determinants of Health (SDOH) Interventions SDOH Screenings   Food Insecurity: No Food Insecurity (02/15/2023)  Housing: Low Risk  (02/15/2023)  Transportation Needs: No Transportation Needs (02/15/2023)  Utilities: Not At Risk (02/15/2023)  Tobacco Use: Medium Risk (02/22/2023)    Readmission Risk Interventions     No data to display

## 2023-03-01 DIAGNOSIS — U071 COVID-19: Secondary | ICD-10-CM | POA: Diagnosis not present

## 2023-03-01 MED ORDER — TORSEMIDE 20 MG PO TABS: 20.0000 mg | ORAL_TABLET | ORAL | 0 refills | Status: AC

## 2023-03-01 NOTE — Progress Notes (Signed)
Report called to [employer] given to [employer], LPN. Room number 511, belongs sent with patient.

## 2023-03-01 NOTE — Discharge Summary (Addendum)
Physician Discharge Summary   Patient: Izzah Pasqua MRN: [id number] DOB: 16-Dec-1933  Admit date:     02/15/2023  Discharge date: 03/01/23  Discharge Physician: Enedina Finner   PCP: Lauro Regulus, MD   Recommendations at discharge:    F/U PCP in 1-2 weeks F/u Dr Lonna Cobb on 03/2023 for Voiding trial  Discharge Diagnoses: Principal Problem:   COVID Active Problems:   Acute on chronic systolic CHF (congestive heart failure) (HCC)   Atrial fibrillation, chronic (HCC)   COVID-19 virus infection   Mycoplasma pneumoniae pneumonia   Abdominal pain  Zenith Kercheval is a 89 y.o. female  with medical history significant of chronic HFrEF with LVEF 30-35%, HTN, RA with severe finger deformity, HLD, CKD stage IIIb, PAF not on anticoagulation due to history of SAH, who was brought from the nursing home for evaluation of productive cough and worsening shortness of breath.  Reportedly, she also had low-grade fever at the nursing home.  She was started on diagnosed with COVID-19 infection at the nursing home on 02/07/2023 and had completed 5 days of Paxlovid.    Acute on chronic systolic CHF: -- Stable.  --PCP to assess and resume torsemide as needed as outpt --2D echo on 10/19 showed EF estimated 25 to 30%, severely dilated left and right atria, moderately reduced RV function. --No left atrial mass on 2D echo. --torsemide QOD    Aortic atherosclerosis atrial fibrillation with RVR: -- Heart rate has improved.  Continue Cardizem.  --[name] (daughter) said patient was not taking any long-term baby aspirin prior to admission.  She wants this to be discontinued.  She is not on anticoagulation because of history of fall and intracranial bleed.    COVID-19 infection: Completed 5 days of Paxlovid prior to admission. Mycoplasma pneumoniae pneumonia: Mycoplasma pneumonia IgM was positive (970).  Patient had already received 3 days of azithromycin (completed on 02/18/2023). --She completed 5 days of  Omnicef on 02/22/2023 --No acute pulmonary embolism or pneumonia on CTA chest.   Acute urinary retention as noted on CT abdomen -- patient had in and out catheter yesterday with 2000 mL output -- bladder scan today showed more than 800 mL. Foley catheter placed after discussion with daughter [name]. Patient had about 1300 mL output. -- Spoke with urology. Patient has appointment for voiding trial as outpatient.   Constipation: Improved after use of laxatives.  No fluids abdominal x-ray on 02/25/2023 showed small to moderate stool burden.    Thyroid nodule (1.1 x 1.7 cm): Outpatient follow-up with PCP Descending thoracic aortic aneurysm 3.8 cm: Outpatient follow-up with PCP. These findings were discussed with [name], daughter, over the phone.     COPD: Continue bronchodilators as needed    CKD stage IIIa: Creatinine is stable --torsemide ow every other day  General Weakness: PT recommended discharge to SNF.   Family communication :[name] Consults :none CODE STATUS: DNR DVT Prophylaxis : heparin       Pain control - [place] Controlled Substance Reporting System database was reviewed. and patient was instructed, not to drive, operate heavy machinery, perform activities at heights, swimming or participation in water activities or provide baby-sitting services while on Pain, Sleep and Anxiety Medications; until their outpatient Physician has advised to do so again. Also recommended to not to take more than prescribed Pain, Sleep and Anxiety Medications.  Disposition: Rehabilitation facility Diet recommendation:  Discharge Diet Orders (From admission, onward)     Start     Ordered   03/01/23 0000  Diet -  low sodium heart healthy        03/01/23 0912           Cardiac diet DISCHARGE MEDICATION: Allergies as of 03/01/2023       Reactions   Metoprolol Shortness Of Breath   Penicillins Other (See Comments)   Internal swelling including throat TOLERATES CEFTRIAXONE    Methotrexate Derivatives Nausea Only   Doxycycline Hyclate Rash        Medication List     STOP taking these medications    azithromycin 250 MG tablet Commonly known as: Zithromax   chlorpheniramine-HYDROcodone 10-8 MG/5ML Commonly known as: TUSSIONEX   furosemide 40 MG tablet Commonly known as: LASIX       TAKE these medications    acetaminophen 650 MG CR tablet Commonly known as: TYLENOL Take 650 mg by mouth 2 (two) times daily.   albuterol 108 (90 Base) MCG/ACT inhaler Commonly known as: VENTOLIN HFA Inhale 2 puffs into the lungs every 4 (four) hours as needed for wheezing or shortness of breath.   diltiazem 240 MG 24 hr tablet Commonly known as: CARDIZEM LA Take 240 mg by mouth daily.   potassium chloride 10 MEQ tablet Commonly known as: KLOR-CON Take 10 mEq by mouth daily.   sennosides-docusate sodium 8.6-50 MG tablet Commonly known as: SENOKOT-S Take 2 tablets by mouth 2 (two) times daily.   simvastatin 10 MG tablet Commonly known as: Zocor Take 1 tablet (10 mg total) by mouth every evening.   Spiriva HandiHaler 18 MCG inhalation capsule Generic drug: tiotropium Place 18 mcg into inhaler and inhale daily.   torsemide 20 MG tablet Commonly known as: DEMADEX Take 1 tablet (20 mg total) by mouth every other day. Start taking on: March 03, 2023 What changed:  how much to take when to take this   traMADol 50 MG tablet Commonly known as: ULTRAM Take 1 tablet (50 mg total) by mouth every 6 (six) hours as needed for moderate pain.        Follow-up Information     Lauro Regulus, MD. Schedule an appointment as soon as possible for a visit in 1 week(s).   Specialty: Internal Medicine Contact information: [address] [facility] [place] [address] [phone]         Riki Altes, MD. Go on 03/20/2023.   Specialty: Urology Why: at 8:30 am and [facility] at 4 pm for voiding trial Contact  information: [address] [place] [address] [phone]                Discharge Exam: Ceasar Mons Weights   02/15/23 0701  Weight: 54.4 kg   GENERAL:  89 y.o.-year-old patient with no acute distress.  LUNGS: Normal breath sounds bilaterally, no wheezing CARDIOVASCULAR: S1, S2 normal. No murmur   ABDOMEN: Soft, nontender, nondistended.  Foley+ EXTREMITIES: severe DJD in extremities  Condition at discharge: fair  The results of significant diagnostics from this hospitalization (including imaging, microbiology, ancillary and laboratory) are listed below for reference.   Imaging Studies: CT ABDOMEN PELVIS W CONTRAST  Result Date: 02/26/2023 CLINICAL DATA:  Abdominal pain, fever, pneumonia EXAM: CT ABDOMEN AND PELVIS WITH CONTRAST TECHNIQUE: Multidetector CT imaging of the abdomen and pelvis was performed using the standard protocol following bolus administration of intravenous contrast. RADIATION DOSE REDUCTION: This exam was performed according to the departmental dose-optimization program which includes automated exposure control, adjustment of the mA and/or kV according to patient size and/or  use of iterative reconstruction technique. CONTRAST:  80mL OMNIPAQUE IOHEXOL 300 MG/ML  SOLN COMPARISON:  02/15/2023, 02/16/2023, 01/21/2022 FINDINGS: Lower chest: Trace left pleural effusion. Marked dilatation of the left atrium and calcification of the mitral annulus again noted. Hepatobiliary: Cholecystectomy. Pneumobilia is likely due to prior instrumentation or incompetent sphincter. Liver is unremarkable. Pancreas: Unremarkable. No pancreatic ductal dilatation or surrounding inflammatory changes. Spleen: Normal in size without focal abnormality. Adrenals/Urinary Tract: Stable appearance of the kidneys. No urinary tract calculi or obstructive uropathy. There is marked distension of the urinary bladder, without filling defect. Stable nonspecific bilateral adrenal thickening.  Stomach/Bowel: No bowel obstruction or ileus. Diverticulosis throughout the distal colon without evidence of acute diverticulitis. No bowel wall thickening or inflammatory change. Vascular/Lymphatic: Stable ectasia and atherosclerosis of the thoracic aorta, with 3.6 cm descending thoracic aortic aneurysm unchanged. Diffuse aortic atherosclerosis. No pathologic adenopathy. Reproductive: Status post hysterectomy. No adnexal masses. Other: No free fluid or free intraperitoneal gas. No abdominal wall hernia. Musculoskeletal: No acute or destructive bony abnormalities. Prior left hip ORIF. Reconstructed images demonstrate no additional findings. IMPRESSION: 1. Marked distension of the urinary bladder. 2. Distal colonic diverticulosis without diverticulitis. 3. Stable 3.6 cm descending thoracic aortic aneurysm. Stable atherosclerosis throughout the thoracoabdominal aorta. 4. Trace left pleural effusion. Electronically Signed   By: Sharlet Salina M.D.   On: 02/26/2023 17:26   DG Abd 1 View  Result Date: 02/25/2023 CLINICAL DATA:  Abdominal pain.  Constipation. EXAM: ABDOMEN - 1 VIEW COMPARISON:  04/07/2020. FINDINGS: The bowel gas pattern is non-obstructive. Small-to-moderate stool burden. No evidence of pneumoperitoneum, within the limitations of a supine film. No acute osseous abnormalities. Moderate atherosclerotic vascular calcifications noted. The soft tissues are otherwise within normal limits. Surgical changes, devices, tubes and lines: Partially seen left proximal femur intramedullary nail. There are surgical clips in the right upper quadrant, typical of a previous cholecystectomy. IMPRESSION: *Nonobstructive bowel gas pattern.  Small-to-moderate stool burden. Electronically Signed   By: Jules Schick M.D.   On: 02/25/2023 10:50   ECHOCARDIOGRAM COMPLETE  Result Date: 02/16/2023    ECHOCARDIOGRAM REPORT   Patient Name:   PATRECIA VEIGA Date of Exam: 02/16/2023 Medical Rec #:  [id number]      Height:        65.0 in Accession #:    [id number]     Weight:       120.0 lb Date of Birth:  [birth year]     BSA:          1.592 m Patient Age:    88 years       BP:           107/96 mmHg Patient Gender: F              HR:           97 bpm. Exam Location:  [facility] Procedure: 2D Echo Indications:     Atrial Fibrillation I48.91  History:         Patient has no prior history of Echocardiogram examinations.  Sonographer:     Overton Mam RDCS, FASE Referring Phys:  [id number] Emeline General Diagnosing Phys: Clotilde Dieter  Sonographer Comments: Technically challenging study due to limited acoustic windows, suboptimal subcostal window and suboptimal apical window. Image acquisition challenging due to respiratory motion and Image acquisition challenging due to uncooperative patient. Unable to complete study due to patient coughing and in pain. Both the patient and daughter requested the test be stopped due to being uncomfortable. IMPRESSIONS  1.  Left ventricular ejection fraction, by estimation, is 25 to 30%. The left ventricle has severely decreased function. The left ventricle demonstrates global hypokinesis. Left ventricular diastolic parameters are indeterminate.  2. Right ventricular systolic function is moderately reduced. The right ventricular size is moderately enlarged.  3. Left atrial size was severely dilated.  4. Right atrial size was severely dilated.  5. The mitral valve is grossly normal. Mild mitral valve regurgitation.  6. The aortic valve is grossly normal. Aortic valve regurgitation is trivial. No aortic stenosis is present. FINDINGS  Left Ventricle: Left ventricular ejection fraction, by estimation, is 25 to 30%. The left ventricle has severely decreased function. The left ventricle demonstrates global hypokinesis. The left ventricular internal cavity size was normal in size. There is no left ventricular hypertrophy. Left ventricular diastolic parameters are indeterminate. Right Ventricle: The right ventricular size is  moderately enlarged. No increase in right ventricular wall thickness. Right ventricular systolic function is moderately reduced. Left Atrium: Left atrial size was severely dilated. Right Atrium: Right atrial size was severely dilated. Pericardium: There is no evidence of pericardial effusion. Mitral Valve: The mitral valve is grossly normal. Mild mitral valve regurgitation. Tricuspid Valve: The tricuspid valve is grossly normal. Tricuspid valve regurgitation is mild. Aortic Valve: The aortic valve is grossly normal. Aortic valve regurgitation is trivial. No aortic stenosis is present. Pulmonic Valve: The pulmonic valve was grossly normal. Pulmonic valve regurgitation is trivial. Aorta: The aortic root was not well visualized. IAS/Shunts: No atrial level shunt detected by color flow Doppler.  LEFT VENTRICLE PLAX 2D LVIDd:         4.00 cm LVIDs:         2.80 cm LV PW:         1.20 cm LV IVS:        1.10 cm LVOT diam:     2.00 cm LVOT Area:     3.14 cm  LEFT ATRIUM         Index LA diam:    3.60 cm 2.26 cm/m   AORTA Ao Root diam: 3.50 cm Ao Asc diam:  3.00 cm  SHUNTS Systemic Diam: 2.00 cm Rozell Searing Custovic Electronically signed by Clotilde Dieter Signature Date/Time: 02/16/2023/1:24:41 PM    Final    DG Chest 1 View  Result Date: 02/16/2023 CLINICAL DATA:  89 year old female with history of congestive heart failure. EXAM: CHEST  1 VIEW COMPARISON:  Chest x-ray 02/15/2023. FINDINGS: Lung volumes are normal. Bibasilar opacities which may reflect areas of atelectasis and/or scarring. No definite consolidative airspace disease. Blunting of the left costophrenic sulcus which may suggest trace left pleural effusion. No right pleural effusion. No pneumothorax. No evidence of pulmonary edema. Diffuse interstitial prominence and peribronchial cuffing, similar to prior studies, presumably reflective of chronic bronchitis. Mild cardiomegaly. Upper mediastinal contours are within normal limits. Atherosclerotic calcifications  are noted within the thoracic aorta. IMPRESSION: 1. The appearance of the lungs is similar to prior studies, suggestive of chronic bronchitis. 2. Cardiomegaly. 3. Trace left pleural effusion. 4. Aortic atherosclerosis. Electronically Signed   By: Trudie Reed M.D.   On: 02/16/2023 07:13   CT Angio Chest PE W and/or Wo Contrast  Result Date: 02/15/2023 CLINICAL DATA:  Pulmonary embolism (PE) suspected, high prob. Shortness of breath. Cough. EXAM: CT ANGIOGRAPHY CHEST WITH CONTRAST TECHNIQUE: Multidetector CT imaging of the chest was performed using the standard protocol during bolus administration of intravenous contrast. Multiplanar CT image reconstructions and MIPs were obtained to evaluate the vascular anatomy. RADIATION DOSE  REDUCTION: This exam was performed according to the departmental dose-optimization program which includes automated exposure control, adjustment of the mA and/or kV according to patient size and/or use of iterative reconstruction technique. CONTRAST:  75mL OMNIPAQUE IOHEXOL 350 MG/ML SOLN COMPARISON:  None Available. FINDINGS: Cardiovascular: No evidence of embolism to the proximal subsegmental pulmonary artery level. Mild cardiomegaly. No pericardial effusion. 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 No significant interval change. There are coronary artery calcifications, in keeping with coronary artery disease. There are also moderate peripheral atherosclerotic vascular calcifications of thoracic aorta and its major branches. There is reflux of contrast into the intrahepatic IVC and central hepatic veins, suggesting right heart strain/failure. There is slightly lobulated area surrounding the left atrial appendage, which has internal CT attenuation of 56-66 Hounsfield units. This is of indeterminate etiology. This area is not seen on the prior examination so comparison can not be made. Further evaluation with  echocardiography is recommended. Mediastinum/Nodes: There is a 1.1 x 1.7 cm hypoattenuating nodule in the right thyroid lobe which exhibits few calcifications along the inferior aspect. This is incompletely characterized on the current examination. The nodule meets the size criteria for follow-up thyroid ultrasound evaluation. There is an additional subcentimeter hypoattenuating nodule in the inferior left thyroid lobe. The esophagus is nondistended precluding optimal assessment. There are few mildly prominent mediastinal and hilar lymph nodes, which do not meet the size criteria for lymphadenopathy and though indeterminate most likely benign in etiology. No axillary lymphadenopathy by size criteria. Lungs/Pleura: The central tracheo-bronchial tree is patent. There is mild, smooth, circumferential thickening of the segmental and subsegmental bronchial walls, throughout bilateral lungs, which is nonspecific. Findings are most commonly seen with bronchitis or reactive airway disease, such as asthma. There are patchy areas of linear, plate-like atelectasis and/or scarring throughout bilateral lungs. There are also patchy subcentimeter sized ground-glass opacities in the right lung apex, nonspecific but likely sequela of infection or inflammation. Mild upper lobe predominant centrilobular emphysema noted. No mass or consolidation. No pleural effusion or pneumothorax. No suspicious lung nodules. Upper Abdomen: Visualized upper abdominal viscera within normal limits. Surgically absent gallbladder. Musculoskeletal: The visualized soft tissues of the chest wall are grossly unremarkable. No suspicious osseous lesions. There are mild to moderate multilevel degenerative changes in the visualized spine. Review of the MIP images confirms the above findings. IMPRESSION: 1. No evidence of pulmonary embolism to the proximal subsegmental pulmonary artery level. 2. Reflux of contrast into the intrahepatic IVC and central hepatic  veins, suggesting right heart strain/failure. 3. Mild fusiform aneurysmal dilation of the descending thoracic aorta measuring up to 3.8 cm in diameter. No significant interval change. 4. Lobulated area surrounding the left atrial appendage, which has internal CT attenuation of 56-66 Hounsfield units. This is of indeterminate etiology. This area is not seen on the prior examination so comparison can not be made. Further evaluation with echocardiography patchy 5. Patchy subcentimeter sized ground-glass opacities in the right lung apex, nonspecific but likely sequela of infection or inflammation. 6. There is a 1.1 x 1.7 cm hypoattenuating nodule in the right thyroid lobe which exhibits few calcifications along the inferior aspect. The nodule meets the size criteria for follow-up nonemergent thyroid ultrasound evaluation. Aortic Atherosclerosis (ICD10-I70.0) and Emphysema (ICD10-J43.9). Electronically Signed   By: Jules Schick M.D.   On: 02/15/2023 11:18   DG Chest 2 View  Result Date: 02/15/2023 CLINICAL DATA:  Shortness of breath, COVID positive, symptoms x1 week. EXAM: CHEST -  2 VIEW COMPARISON:  Portable chest 05/03/2022 FINDINGS: The lungs are mildly emphysematous. There is hazy interstitial consolidation peripherally in the right upper lobe mid chest level. Findings concerning for pneumonitis. The remaining lungs are clear. There is no substantial pleural effusion. Stable mediastinum with aortic atherosclerosis and uncoiling. Stable cardiomegaly. No vascular congestion is seen. There is osteopenia, thoracic kyphosis and degenerative change. IMPRESSION: Hazy interstitial consolidation peripherally in the right upper lobe concerning for pneumonitis. Clinical correlation and radiographic follow-up recommended. Electronically Signed   By: Almira Bar M.D.   On: 02/15/2023 08:01    Microbiology: Results for orders placed or performed during the hospital encounter of 02/15/23  Blood culture (routine x 2)      Status: None   Collection Time: 02/15/23  8:15 AM   Specimen: BLOOD  Result Value Ref Range Status   Specimen Description BLOOD BLOOD LEFT ARM  Final   Special Requests   Final    BOTTLES DRAWN AEROBIC AND ANAEROBIC Blood Culture adequate volume   Culture   Final    NO GROWTH 5 DAYS Performed at [facility], [address] [place], [place] [address]    Report Status 02/20/2023 FINAL  Final  Blood culture (routine x 2)     Status: None   Collection Time: 02/15/23  8:15 AM   Specimen: BLOOD  Result Value Ref Range Status   Specimen Description BLOOD BLOOD RIGHT ARM  Final   Special Requests   Final    BOTTLES DRAWN AEROBIC AND ANAEROBIC Blood Culture adequate volume   Culture   Final    NO GROWTH 5 DAYS Performed at [facility], [address] [place], [place] [address]    Report Status 02/20/2023 FINAL  Final  SARS Coronavirus 2 by RT PCR (hospital order, performed in [facility] hospital lab) *cepheid single result test* Anterior Nasal Swab     Status: Abnormal   Collection Time: 02/15/23  8:15 AM   Specimen: Anterior Nasal Swab  Result Value Ref Range Status   SARS Coronavirus 2 by RT PCR POSITIVE (A) NEGATIVE Final    Comment: (NOTE) SARS-CoV-2 target nucleic acids are DETECTED  SARS-CoV-2 RNA is generally detectable in upper respiratory specimens  during the acute phase of infection.  Positive results are indicative  of the presence of the identified virus, but do not rule out bacterial infection or co-infection with other pathogens not detected by the test.  Clinical correlation with patient history and  other diagnostic information is necessary to determine patient infection status.  The expected result is negative.  Fact Sheet for Patients:   RoadLapTop.co.za   Fact Sheet for Healthcare Providers:   http://kim-miller.com/    This test is not yet approved or cleared by the [place] FDA and   has been authorized for detection and/or diagnosis of SARS-CoV-2 by FDA under an Emergency Use Authorization (EUA).  This EUA will remain in effect (meaning this test can be used) for the duration of  the COVID-19 declaration under Section 564(b)(1)  of the Act, 21 U.S.C. section 360-bbb-3(b)(1), unless the authorization is terminated or revoked sooner.   Performed at [facility], [address] [place], [place] [address]   Expectorated Sputum Assessment w Gram Stain, Rflx to Resp Cult     Status: None   Collection Time: 02/15/23 11:52 PM   Specimen: Expectorated Sputum  Result Value Ref Range Status   Specimen Description EXPECTORATED SPUTUM  Final   Special Requests NONE  Final   Sputum evaluation   Final    THIS SPECIMEN IS ACCEPTABLE FOR SPUTUM CULTURE Performed at [facility], [address] [place], [place] [address]    Report Status 02/16/2023 FINAL  Final  Culture, Respiratory w Gram Stain     Status: None   Collection Time: 02/15/23 11:52 PM  Result Value Ref Range Status   Specimen Description   Final    EXPECTORATED SPUTUM Performed at [facility], [address] [place], [place] [address]    Special Requests   Final    NONE Reflexed from [id number] Performed at [facility], [address] [place], [place] [address]    Gram Stain   Final    FEW WBC PRESENT, PREDOMINANTLY PMN FEW GRAM POSITIVE COCCI RARE GRAM NEGATIVE RODS    Culture   Final    MODERATE Consistent with normal respiratory flora. No Pseudomonas species isolated Performed at [facility], [address] [place], [place] [address]    Report Status 02/18/2023 FINAL  Final  MRSA Next Gen by PCR, Nasal     Status: Abnormal   Collection Time: 02/24/23  2:50 PM   Specimen: Nasal Mucosa; Nasal Swab  Result Value Ref Range Status   MRSA by PCR Next Gen DETECTED (A) NOT DETECTED Final    Comment: RESULT CALLED TO, READ BACK BY AND VERIFIED  WITH: Alejandro Mulling [id number] 02/24/2023 CP (NOTE) The GeneXpert MRSA Assay (FDA approved for NASAL specimens only), is one component of a comprehensive MRSA colonization surveillance program. It is not intended to diagnose MRSA infection nor to guide or monitor treatment for MRSA infections. Test performance is not FDA approved in patients less than 35 years old. Performed at [facility], [address] [place], [place] [address]     Labs: CBC: Recent Labs  Lab 02/23/23 0433 02/26/23 1012  WBC 12.0* 9.9  NEUTROABS  --  8.0*  HGB 14.9 11.9*  HCT 47.4* 35.7*  MCV 99.6 89.3  PLT 164 278   Basic Metabolic Panel: Recent Labs  Lab 02/25/23 1715 02/26/23 1012 02/27/23 0410  NA 130* 130* 131*  K 3.6 3.5 3.7  CL 96* 97* 98  CO2 25 24 23   GLUCOSE 121* 146* 96  BUN 30* 29* 23  CREATININE 1.23* 1.27* 0.99  CALCIUM 9.6 9.6 9.8   Liver Function Tests: Recent Labs  Lab 02/26/23 1012  AST 22  ALT 17  ALKPHOS 124  BILITOT 0.8  PROT 6.3*  ALBUMIN 2.4*    Discharge time spent: greater than 30 minutes.  Signed: Enedina Finner, MD [facility] 03/01/2023

## 2023-03-01 NOTE — TOC Transition Note (Addendum)
Transition of Care [facility]) - CM/SW Discharge Note   Patient Details  Name: Roberta Ross MRN: [id number] Date of Birth: May 14, 1933  [facility]) CM/SW Contact:  Allena Katz, LCSW Phone Number: 03/01/2023, 10:06 AM   Clinical Narrative:   [facility]. Rn given number for report. DC summary sent. [facility] called. [name] notified.    Final next level of care: Skilled Nursing Facility Barriers to Discharge: Barriers Resolved   Patient Goals and CMS Choice CMS Medicare.gov Compare Post Acute Care list provided to:: Patient Represenative (must comment) (donna)    Discharge Placement                Patient chooses bed at: [facility] Patient to be transferred to facility by: [facility] Name of family member notified: donna Patient and family notified of of transfer: 03/01/23  Discharge Plan and Services Additional resources added to the After Visit Summary for                                       Social Determinants of Health (SDOH) Interventions SDOH Screenings   Food Insecurity: No Food Insecurity (02/15/2023)  Housing: Low Risk  (02/15/2023)  Transportation Needs: No Transportation Needs (02/15/2023)  Utilities: Not At Risk (02/15/2023)  Tobacco Use: Medium Risk (02/22/2023)     Readmission Risk Interventions     No data to display

## 2023-03-01 NOTE — Discharge Instructions (Signed)
 Foley care per protocol ?

## 2023-03-20 ENCOUNTER — Ambulatory Visit: Payer: Medicare Other | Admitting: Urology

## 2023-03-20 ENCOUNTER — Encounter: Payer: Self-pay | Admitting: Urology

## 2023-03-20 VITALS — BP 99/70 | HR 84 | Ht 65.0 in | Wt 120.0 lb

## 2023-03-20 DIAGNOSIS — R339 Retention of urine, unspecified: Secondary | ICD-10-CM | POA: Diagnosis not present

## 2023-03-20 LAB — BLADDER SCAN AMB NON-IMAGING: Scan Result: 203

## 2023-03-20 NOTE — Progress Notes (Signed)
I, Maysun Anabel Bene, acting as a scribe for Riki Altes, MD., have documented all relevant documentation on the behalf of Riki Altes, MD, as directed by Riki Altes, MD while in the presence of Riki Altes, MD.  03/20/2023 1:05 PM   Roberta Ross 25-Sep-1933 [id number]  Referring provider: Lauro Regulus, MD [address] [facility] [address] [place],  [place] [address]  Chief Complaint  Patient presents with   Urinary Retention    HPI: Roberta Ross is a 89 y.o. female with a history of urinary tension presents for catheter removal/voiding trial.  Hospitalized 02/15/2023 for COVID/pneumonia.  During that hospitalization she was complaining of lower abdominal pain and a CT of the abdomen and pelvis was performed which showed marked bladder distention. In/out catheterization was performed with 2,000 mL of urine obtained. Follow-up bladder scan showed 800 mL and a Foley catheter was placed. She presents today with her daughter who states she has had no previous history of urologic problems or urinary retention.  She did have constipation during that hospitalization, which has resolved.    PMH: Past Medical History:  Diagnosis Date   Anemia    past hx   Arthritis    hands and feet   Chronic kidney disease    COPD (chronic obstructive pulmonary disease) (HCC)    Dyspnea    Dysrhythmia    Pt reports slow HR and papatations   Heart murmur    past hx   Hypercholesteremia    Hypertension    Rheumatic fever    (x2) in past   Wears dentures    full upper    Surgical History: Past Surgical History:  Procedure Laterality Date   ABDOMINAL HYSTERECTOMY     APPENDECTOMY     as a child   CATARACT EXTRACTION W/PHACO Left 01/10/2015   Procedure: CATARACT EXTRACTION PHACO AND INTRAOCULAR LENS PLACEMENT (IOC);  Surgeon: Sherald Hess, MD;  Location: [facility];  Service: Ophthalmology;  Laterality: Left;   COMPLEX WOUND  CLOSURE Left 11/15/2021   Procedure: COMPLEX WOUND CLOSURE;  Surgeon: Vernie Murders, MD;  Location: [facility];  Service: ENT;  Laterality: Left;   EXCISION MASS HEAD Left 11/15/2021   Procedure: EXCISION MALIGNANT MASS EAR;  Surgeon: Vernie Murders, MD;  Location: [facility];  Service: ENT;  Laterality: Left;   INTRAMEDULLARY (IM) NAIL INTERTROCHANTERIC Left 03/28/2020   Procedure: INTRAMEDULLARY (IM) NAIL INTERTROCHANTRIC-Hip Fracture;  Surgeon: Signa Kell, MD;  Location: [facility];  Service: Orthopedics;  Laterality: Left;   TONSILLECTOMY      Home Medications:  Allergies as of 03/20/2023       Reactions   Metoprolol Shortness Of Breath   Penicillins Other (See Comments)   Internal swelling including throat TOLERATES CEFTRIAXONE   Methotrexate Derivatives Nausea Only   Doxycycline Hyclate Rash        Medication List        Accurate as of March 20, 2023  1:05 PM. If you have any questions, ask your nurse or doctor.          acetaminophen 650 MG CR tablet Commonly known as: TYLENOL Take 650 mg by mouth 2 (two) times daily.   albuterol 108 (90 Base) MCG/ACT inhaler Commonly known as: VENTOLIN HFA Inhale 2 puffs into the lungs every 4 (four) hours as needed for wheezing or shortness of breath.   diltiazem 240 MG 24 hr tablet Commonly known as: CARDIZEM LA Take 240 mg by  mouth daily.   potassium chloride 10 MEQ tablet Commonly known as: KLOR-CON Take 10 mEq by mouth daily.   sennosides-docusate sodium 8.6-50 MG tablet Commonly known as: SENOKOT-S Take 2 tablets by mouth 2 (two) times daily.   simvastatin 10 MG tablet Commonly known as: Zocor Take 1 tablet (10 mg total) by mouth every evening.   Spiriva HandiHaler 18 MCG inhalation capsule Generic drug: tiotropium Place 18 mcg into inhaler and inhale daily.   torsemide 20 MG tablet Commonly known as: DEMADEX Take 1 tablet (20 mg total) by mouth every other day.   traMADol 50 MG tablet Commonly known as:  ULTRAM Take 1 tablet (50 mg total) by mouth every 6 (six) hours as needed for moderate pain.        Allergies:  Allergies  Allergen Reactions   Metoprolol Shortness Of Breath   Penicillins Other (See Comments)    Internal swelling including throat TOLERATES CEFTRIAXONE   Methotrexate Derivatives Nausea Only   Doxycycline Hyclate Rash    Family History: Family History  Problem Relation Age of Onset   Cervical cancer Mother    Heart attack Father    Prostate cancer Father    Asthma Brother     Social History:  reports that she quit smoking about 4 years ago. Her smoking use included cigarettes. She started smoking about 64 years ago. She has a 15 pack-year smoking history. She has never used smokeless tobacco. She reports that she does not currently use drugs. She reports that she does not drink alcohol.   Physical Exam: BP 99/70   Pulse 84   Ht 5\' 5"  (1.651 m)   Wt 120 lb (54.4 kg)   BMI 19.97 kg/m   Constitutional:  Alert, No acute distress. HEENT: [place] AT Respiratory: Normal respiratory effort, no increased work of breathing. Psychiatric: Normal mood and affect.   Pertinent Imaging: CT was personally reviewed and interpreted.   CT EXAM: CT ABDOMEN AND PELVIS WITH CONTRAST   TECHNIQUE: Multidetector CT imaging of the abdomen and pelvis was performed using the standard protocol following bolus administration of intravenous contrast.   RADIATION DOSE REDUCTION: This exam was performed according to the departmental dose-optimization program which includes automated exposure control, adjustment of the mA and/or kV according to patient size and/or use of iterative reconstruction technique.   CONTRAST:  80mL OMNIPAQUE IOHEXOL 300 MG/ML  SOLN   COMPARISON:  02/15/2023, 02/16/2023, 01/21/2022   FINDINGS: Lower chest: Trace left pleural effusion. Marked dilatation of the left atrium and calcification of the mitral annulus again noted.   Hepatobiliary:  Cholecystectomy. Pneumobilia is likely due to prior instrumentation or incompetent sphincter. Liver is unremarkable.   Pancreas: Unremarkable. No pancreatic ductal dilatation or surrounding inflammatory changes.   Spleen: Normal in size without focal abnormality.   Adrenals/Urinary Tract: Stable appearance of the kidneys. No urinary tract calculi or obstructive uropathy. There is marked distension of the urinary bladder, without filling defect. Stable nonspecific bilateral adrenal thickening.   Stomach/Bowel: No bowel obstruction or ileus. Diverticulosis throughout the distal colon without evidence of acute diverticulitis. No bowel wall thickening or inflammatory change.   Vascular/Lymphatic: Stable ectasia and atherosclerosis of the thoracic aorta, with 3.6 cm descending thoracic aortic aneurysm unchanged. Diffuse aortic atherosclerosis. No pathologic adenopathy.   Reproductive: Status post hysterectomy. No adnexal masses.   Other: No free fluid or free intraperitoneal gas. No abdominal wall hernia.   Musculoskeletal: No acute or destructive bony abnormalities. Prior left hip ORIF. Reconstructed images demonstrate no  additional findings.   IMPRESSION: 1. Marked distension of the urinary bladder. 2. Distal colonic diverticulosis without diverticulitis. 3. Stable 3.6 cm descending thoracic aortic aneurysm. Stable atherosclerosis throughout the thoracoabdominal aorta. 4. Trace left pleural effusion.     Electronically Signed   By: Sharlet Salina M.D.   On: 02/26/2023 17:26  Assessment & Plan:    1. Urinary retention Catheter was removed today She has a follow-up PA appointment this afternoon for a repeat bladder scan.   I have reviewed the above documentation for accuracy and completeness, and I agree with the above.   Riki Altes, MD  [facility] [address] [place], [place] [address] [phone]

## 2023-03-20 NOTE — Progress Notes (Signed)
Catheter Removal  Patient is present today for a catheter removal.  10ml of water was drained from the balloon. A 16FR foley cath was removed from the bladder, no complications were noted. Patient tolerated well.  Performed by: Dorinda Hill Danzel Marszalek,CMA  Follow up/ Additional notes: follow up at 4  pm for PVR

## 2023-03-25 NOTE — Progress Notes (Signed)
She returned this afternoon with her daughter, [name], stating she had not been able to void all day.  She states that she drank 3 glasses of liquid.  Her bladder scan noted a residual of 203 mL.  Her undergarments were dry.  I explained to her and her daughter that I was concerned that she will end up going into urinary retention during the night and at this time we could either replace the Foley catheter or instruct her on self catheterization.  Both the patient and her daughter stated they would not be able to self cath, so we decided to replace the Foley.  Simple Catheter Placement  Due to urinary retention patient is present today for a foley cath placement.  Patient was cleaned and prepped in a sterile fashion with betadine.  A 16 FR foley catheter was inserted, urine return was noted  300 ml, urine was yellow clear in color.  The balloon was filled with 10cc of sterile water.  A night bag was attached for drainage.  Patient was given instruction on proper catheter care.  Patient tolerated well, no complications were noted   Performed by: Michiel Cowboy, PA-C   Additional notes/ Follow up: Return in about 3 weeks (around 04/10/2023) for Voiding trial .

## 2023-04-01 NOTE — Progress Notes (Unsigned)
04/04/2023 3:51 PM   Roberta Ross Sep 15, 1933 [id number]  Referring provider: Lauro Regulus, MD [address] [facility] [address] [facility] [place],  [place] [address]  Urological history: 1. Urinary retention -? Secondary to constipation  -failed TOV on 03/20/2023   Chief Complaint  Patient presents with   voiding trial   HPI: Roberta Ross is a 89 y.o. female who presents today for second TOV with her daughter, Roberta Ross.   Previous records reviewed.   On March 20, 2023, she had a Foley catheter removed that morning for voiding trial.  When she presented the afternoon, she expressed that she had been drinking fluids and had been unable to urinate at all.  Bladder scan noted a PVR of 203 mL and after discussion with the patient and her daughter and my concerns of her going into urinary retention during the night we decided to replace the Foley catheter and have a second attempt for a voiding trial.    Foley catheter is removed this am.    She has been drinking water and has voided successfully.  Patient denies any modifying or aggravating factors.  Patient denies any recent UTI's, gross hematuria, dysuria or suprapubic/flank pain.  Patient denies any fevers, chills, nausea or vomiting.    PVR 145 mL   PMH: Past Medical History:  Diagnosis Date   Anemia    past hx   Arthritis    hands and feet   Chronic kidney disease    COPD (chronic obstructive pulmonary disease) (HCC)    Dyspnea    Dysrhythmia    Pt reports slow HR and papatations   Heart murmur    past hx   Hypercholesteremia    Hypertension    Rheumatic fever    (x2) in past   Wears dentures    full upper    Surgical History: Past Surgical History:  Procedure Laterality Date   ABDOMINAL HYSTERECTOMY     APPENDECTOMY     as a child   CATARACT EXTRACTION W/PHACO Left 01/10/2015   Procedure: CATARACT EXTRACTION PHACO AND INTRAOCULAR LENS PLACEMENT (IOC);  Surgeon: Sherald Hess,  MD;  Location: [facility];  Service: Ophthalmology;  Laterality: Left;   COMPLEX WOUND CLOSURE Left 11/15/2021   Procedure: COMPLEX WOUND CLOSURE;  Surgeon: Vernie Murders, MD;  Location: [facility];  Service: ENT;  Laterality: Left;   EXCISION MASS HEAD Left 11/15/2021   Procedure: EXCISION MALIGNANT MASS EAR;  Surgeon: Vernie Murders, MD;  Location: [facility];  Service: ENT;  Laterality: Left;   INTRAMEDULLARY (IM) NAIL INTERTROCHANTERIC Left 03/28/2020   Procedure: INTRAMEDULLARY (IM) NAIL INTERTROCHANTRIC-Hip Fracture;  Surgeon: Signa Kell, MD;  Location: [facility];  Service: Orthopedics;  Laterality: Left;   TONSILLECTOMY      Home Medications:  Allergies as of 04/04/2023       Reactions   Metoprolol Shortness Of Breath   Penicillins Other (See Comments)   Internal swelling including throat TOLERATES CEFTRIAXONE   Methotrexate Derivatives Nausea Only   Doxycycline Hyclate Rash        Medication List        Accurate as of April 04, 2023  3:51 PM. If you have any questions, ask your nurse or doctor.          acetaminophen 650 MG CR tablet Commonly known as: TYLENOL Take 650 mg by mouth 2 (two) times daily.   albuterol 108 (90 Base) MCG/ACT inhaler Commonly known as: VENTOLIN HFA Inhale 2 puffs  into the lungs every 4 (four) hours as needed for wheezing or shortness of breath.   diltiazem 240 MG 24 hr tablet Commonly known as: CARDIZEM LA Take 240 mg by mouth daily.   potassium chloride 10 MEQ tablet Commonly known as: KLOR-CON Take 10 mEq by mouth daily.   sennosides-docusate sodium 8.6-50 MG tablet Commonly known as: SENOKOT-S Take 2 tablets by mouth 2 (two) times daily.   simvastatin 10 MG tablet Commonly known as: Zocor Take 1 tablet (10 mg total) by mouth every evening.   Spiriva HandiHaler 18 MCG inhalation capsule Generic drug: tiotropium Place 18 mcg into inhaler and inhale daily.   torsemide 20 MG tablet Commonly known as: DEMADEX Take 1  tablet (20 mg total) by mouth every other day.   traMADol 50 MG tablet Commonly known as: ULTRAM Take 1 tablet (50 mg total) by mouth every 6 (six) hours as needed for moderate pain.        Allergies:  Allergies  Allergen Reactions   Metoprolol Shortness Of Breath   Penicillins Other (See Comments)    Internal swelling including throat TOLERATES CEFTRIAXONE   Methotrexate Derivatives Nausea Only   Doxycycline Hyclate Rash    Family History: Family History  Problem Relation Age of Onset   Cervical cancer Mother    Heart attack Father    Prostate cancer Father    Asthma Brother     Social History:  reports that she quit smoking about 4 years ago. Her smoking use included cigarettes. She started smoking about 64 years ago. She has a 15 pack-year smoking history. She has never used smokeless tobacco. She reports that she does not currently use drugs. She reports that she does not drink alcohol.  ROS: Pertinent ROS in HPI  Physical Exam: Constitutional:  Well nourished. Alert and oriented, No acute distress. HEENT: [place] AT, moist mucus membranes.  Trachea midline Cardiovascular: No clubbing, cyanosis, or edema. Respiratory: Normal respiratory effort, no increased work of breathing. Neurologic: Grossly intact, no focal deficits, moving all 4 extremities. Psychiatric: Normal mood and affect.    Laboratory Data: N/A  Pertinent Imaging:  04/04/23 15:45  Scan Result 145 ml    Assessment & Plan:    1. Urinary retention -Foley removed this am -she has voided and is comfortable  -reviewed return to clinic precautions   Return in about 1 month (around 05/05/2023) for PVR and OAB questionnaire.  These notes generated with voice recognition software. I apologize for typographical errors.  Roberta Ross  [facility] [address]  [address] [place], [place] [address] [phone]

## 2023-04-04 ENCOUNTER — Ambulatory Visit: Payer: Medicare Other | Admitting: Urology

## 2023-04-04 ENCOUNTER — Encounter: Payer: Self-pay | Admitting: Urology

## 2023-04-04 DIAGNOSIS — R339 Retention of urine, unspecified: Secondary | ICD-10-CM | POA: Diagnosis not present

## 2023-04-04 LAB — BLADDER SCAN AMB NON-IMAGING: Scan Result: 145

## 2023-04-04 NOTE — Progress Notes (Signed)
Catheter Removal  Patient is present today for a catheter removal.  11 ml of water was drained from the balloon. A 16 FR foley cath was removed from the bladder, no complications were noted. Patient tolerated well.  Performed by: Alston Berrie H And Shannon L  Follow up/ Additional notes: come back in the pm for PVR

## 2023-05-08 ENCOUNTER — Ambulatory Visit: Payer: Medicare Other | Admitting: Urology

## 2023-05-08 DIAGNOSIS — I5032 Chronic diastolic (congestive) heart failure: Secondary | ICD-10-CM | POA: Insufficient documentation

## 2023-05-11 NOTE — Progress Notes (Signed)
 05/15/2023 3:40 PM   Roberta Ross June 02, 1933 [id number]  Referring provider: Jimmy Moulding, MD [address] [facility] [address] [place],  [place] [address]  Urological history: 1. Urinary retention -? Secondary to constipation  -failed TOV on 03/20/2023   Chief Complaint  Patient presents with   Urinary Retention   HPI: Roberta Ross is a 89 y.o. female who presents today for one month follow up with her daughter, Roberta Ross.   Previous records reviewed.   On March 20, 2023, she had a Foley catheter removed that morning for voiding trial.  When she presented the afternoon, she expressed that she had been drinking fluids and had been unable to urinate at all.  Bladder scan noted a PVR of 203 mL and after discussion with the patient and her daughter and my concerns of her going into urinary retention during the night we decided to replace the Foley catheter and have a second attempt for a voiding trial.    On 04/04/2023, Foley catheter is removed this am.  She has been drinking water and has voided successfully.  Patient denies any modifying or aggravating factors.  Patient denies any recent UTI's, gross hematuria, dysuria or suprapubic/flank pain.  Patient denies any fevers, chills, nausea or vomiting.   PVR 145 mL   She is a 1-7 daytime voids, nocturia x 1-2 with a mild urge to urinate.  She wears 1-2 diapers daily and engages in toilet mapping.  Patient denies any modifying or aggravating factors.  Patient denies any recent UTI's, gross hematuria, dysuria or suprapubic/flank pain.  Patient denies any fevers, chills, nausea or vomiting.    PVR 0 mL   PMH: Past Medical History:  Diagnosis Date   Anemia    past hx   Arthritis    hands and feet   Chronic kidney disease    COPD (chronic obstructive pulmonary disease) (HCC)    Dyspnea    Dysrhythmia    Pt reports slow HR and papatations   Heart murmur    past hx   Hypercholesteremia    Hypertension     Rheumatic fever    (x2) in past   Wears dentures    full upper    Surgical History: Past Surgical History:  Procedure Laterality Date   ABDOMINAL HYSTERECTOMY     APPENDECTOMY     as a child   CATARACT EXTRACTION W/PHACO Left 01/10/2015   Procedure: CATARACT EXTRACTION PHACO AND INTRAOCULAR LENS PLACEMENT (IOC);  Surgeon: Billee Buddle, MD;  Location: [facility];  Service: Ophthalmology;  Laterality: Left;   COMPLEX WOUND CLOSURE Left 11/15/2021   Procedure: COMPLEX WOUND CLOSURE;  Surgeon: Mellody Sprout, MD;  Location: [facility];  Service: ENT;  Laterality: Left;   EXCISION MASS HEAD Left 11/15/2021   Procedure: EXCISION MALIGNANT MASS EAR;  Surgeon: Mellody Sprout, MD;  Location: [facility];  Service: ENT;  Laterality: Left;   INTRAMEDULLARY (IM) NAIL INTERTROCHANTERIC Left 03/28/2020   Procedure: INTRAMEDULLARY (IM) NAIL INTERTROCHANTRIC-Hip Fracture;  Surgeon: Lorri Rota, MD;  Location: [facility];  Service: Orthopedics;  Laterality: Left;   TONSILLECTOMY      Home Medications:  Allergies as of 05/15/2023       Reactions   Metoprolol Shortness Of Breath   Penicillins Other (See Comments)   Internal swelling including throat TOLERATES CEFTRIAXONE    Methotrexate Derivatives Nausea Only   Doxycycline Hyclate Rash        Medication List  Accurate as of May 15, 2023  3:40 PM. If you have any questions, ask your nurse or doctor.          acetaminophen  650 MG CR tablet Commonly known as: TYLENOL  Take 650 mg by mouth 2 (two) times daily.   albuterol  108 (90 Base) MCG/ACT inhaler Commonly known as: VENTOLIN  HFA Inhale 2 puffs into the lungs every 4 (four) hours as needed for wheezing or shortness of breath.   diltiazem  240 MG 24 hr tablet Commonly known as: CARDIZEM  LA Take 240 mg by mouth daily.   potassium chloride  10 MEQ tablet Commonly known as: KLOR-CON  Take 10 mEq by mouth daily.   sennosides-docusate sodium  8.6-50 MG  tablet Commonly known as: SENOKOT-S Take 2 tablets by mouth 2 (two) times daily.   simvastatin  10 MG tablet Commonly known as: Zocor  Take 1 tablet (10 mg total) by mouth every evening.   Spiriva HandiHaler 18 MCG inhalation capsule Generic drug: tiotropium Place 18 mcg into inhaler and inhale daily.   torsemide  20 MG tablet Commonly known as: DEMADEX  Take 1 tablet (20 mg total) by mouth every other day.   traMADol  50 MG tablet Commonly known as: ULTRAM  Take 1 tablet (50 mg total) by mouth every 6 (six) hours as needed for moderate pain.        Allergies:  Allergies  Allergen Reactions   Metoprolol Shortness Of Breath   Penicillins Other (See Comments)    Internal swelling including throat TOLERATES CEFTRIAXONE    Methotrexate Derivatives Nausea Only   Doxycycline Hyclate Rash    Family History: Family History  Problem Relation Age of Onset   Cervical cancer Mother    Heart attack Father    Prostate cancer Father    Asthma Brother     Social History:  reports that she quit smoking about 5 years ago. Her smoking use included cigarettes. She started smoking about 65 years ago. She has a 15 pack-year smoking history. She has never used smokeless tobacco. She reports that she does not currently use drugs. She reports that she does not drink alcohol.  ROS: Pertinent ROS in HPI  Physical Exam: Blood pressure 99/66, pulse 86, height 5\' 5"  (1.651 m), weight 120 lb (54.4 kg).  Constitutional:  Well nourished. Alert and oriented, No acute distress. HEENT: [place] AT, moist mucus membranes.  Trachea midline, no masses. Cardiovascular: No clubbing, cyanosis, or edema. Respiratory: Normal respiratory effort, no increased work of breathing. Neurologic: Grossly intact, no focal deficits, moving all 4 extremities. Psychiatric: Normal mood and affect.    Laboratory Data: Basic Metabolic Panel (BMP) Order: [id number] Component Ref Range & Units 3 d ago  Glucose 70 - 110 mg/dL  284  Sodium 132 - 440 mmol/L 138  Potassium 3.6 - 5.1 mmol/L 4.5  Chloride 97 - 109 mmol/L 105  Carbon Dioxide (CO2) 22.0 - 32.0 mmol/L 26.6  Calcium 8.7 - 10.3 mg/dL 10.2  Urea Nitrogen (BUN) 7 - 25 mg/dL 15  Creatinine 0.6 - 1.1 mg/dL 1.2 High   Glomerular Filtration Rate (eGFR) >60 mL/min/1.73sq m 44 Low   Comment: CKD-EPI (2021) does not include patient's race in the calculation of eGFR.  Monitoring changes of plasma creatinine and eGFR over time is useful for monitoring kidney function.  Interpretive Ranges for eGFR (CKD-EPI 2021):  eGFR:       >60 mL/min/1.73 sq. m - Normal eGFR:       30-59 mL/min/1.73 sq. m - Moderately Decreased eGFR:  15-29 mL/min/1.73 sq. m  - Severely Decreased eGFR:       < 15 mL/min/1.73 sq. m  - Kidney Failure   Note: These eGFR calculations do not apply in acute situations when eGFR is changing rapidly or patients on dialysis.  BUN/Crea Ratio 6.0 - 20.0 12.5  Anion Gap w/K 6.0 - 16.0 10.9  Resulting Agency [facility]   Specimen Collected: 05/08/23 15:29   Performed by: [facility] Last Resulted: 05/08/23 17:07  Received From: [facility]  Result Received: 05/11/23 19:55   Hemoglobin A1C Order: [id number] Component Ref Range & Units 3 d ago  Hemoglobin A1C 4.2 - 5.6 % 5.5  Average Blood Glucose (Calc) mg/dL 782  Resulting Agency [facility]  Narrative Performed by [facility] Normal Range:    4.2 - 5.6% Increased Risk:  5.7 - 6.4% Diabetes:        >= 6.5% Glycemic Control for adults with diabetes:  <7%    Specimen Collected: 05/08/23 15:29   Performed by: [facility] Last Resulted: 05/08/23 16:58  Received From: [facility]  Result Received: 05/11/23 19:55   Lipid Panel w/calc LDL Order: [id number] Component Ref Range & Units 3 d ago  Cholesterol, Total 100 - 200 mg/dL 578  Triglyceride 35 - 199 mg/dL 86   HDL (High Density Lipoprotein) Cholesterol 35.0 - 85.0 mg/dL 46.9  LDL Calculated 0 - 130 mg/dL 93  VLDL Cholesterol mg/dL 17  Cholesterol/HDL Ratio 3.1  Resulting Agency [facility]   Specimen Collected: 05/08/23 15:29   Performed by: [facility] Last Resulted: 05/08/23 17:07  Received From: [facility]  Result Received: 05/11/23 19:55   Thyroid  Stimulating-Hormone (TSH) Order: [id number] Component Ref Range & Units 3 d ago  Thyroid  Stimulating Hormone (TSH) 0.450-5.330 uIU/ml uIU/mL 0.623  Comment: Reference Range for Pregnant Females >= 18 yrs old: Normal Range for 1st trimester: 0.05-3.70 ulU/ml Normal Range for 2nd trimester: 0.31-4.35 ulU/ml  Resulting Agency [facility]   Specimen Collected: 05/08/23 15:29   Performed by: [facility] Last Resulted: 05/08/23 17:01  Received From: [facility]  Result Received: 05/11/23 19:55  I have reviewed the labs.  See HPI.     Pertinent Imaging: Component     Latest Ref Rng 05/15/2023  PVR     WU 0.0      Assessment & Plan:    1. Urinary retention -resolved -Patient has been voiding well -She has not any further incidences of urinary retention -She is at goal with her urinary symptoms -We reviewed return to clinic symptoms  Return if symptoms worsen or fail to improve.  These notes generated with voice recognition software. I apologize for typographical errors.  Briant Camper  [facility] [address]  [address] [place], [place] [address] [phone]

## 2023-05-15 ENCOUNTER — Encounter: Payer: Self-pay | Admitting: Urology

## 2023-05-15 ENCOUNTER — Ambulatory Visit: Payer: Medicare Other | Admitting: Urology

## 2023-05-15 VITALS — BP 99/66 | HR 86 | Ht 65.0 in | Wt 120.0 lb

## 2023-05-15 DIAGNOSIS — Z87898 Personal history of other specified conditions: Secondary | ICD-10-CM

## 2023-05-15 DIAGNOSIS — R339 Retention of urine, unspecified: Secondary | ICD-10-CM

## 2023-05-15 DIAGNOSIS — Z09 Encounter for follow-up examination after completed treatment for conditions other than malignant neoplasm: Secondary | ICD-10-CM | POA: Diagnosis not present

## 2023-05-15 LAB — BLADDER SCAN AMB NON-IMAGING: PVR: 0 WU

## 2023-07-22 ENCOUNTER — Encounter: Payer: Self-pay | Admitting: Emergency Medicine

## 2023-07-22 ENCOUNTER — Other Ambulatory Visit: Payer: Self-pay

## 2023-07-22 ENCOUNTER — Emergency Department
Admission: EM | Admit: 2023-07-22 | Discharge: 2023-07-22 | Disposition: A | Discharge status: Home / Self Care | Attending: Emergency Medicine | Admitting: Emergency Medicine

## 2023-07-22 ENCOUNTER — Emergency Department

## 2023-07-22 DIAGNOSIS — J449 Chronic obstructive pulmonary disease, unspecified: Secondary | ICD-10-CM | POA: Insufficient documentation

## 2023-07-22 DIAGNOSIS — G8929 Other chronic pain: Secondary | ICD-10-CM | POA: Insufficient documentation

## 2023-07-22 DIAGNOSIS — I10 Essential (primary) hypertension: Secondary | ICD-10-CM | POA: Diagnosis not present

## 2023-07-22 DIAGNOSIS — N390 Urinary tract infection, site not specified: Secondary | ICD-10-CM | POA: Insufficient documentation

## 2023-07-22 DIAGNOSIS — M549 Dorsalgia, unspecified: Secondary | ICD-10-CM | POA: Insufficient documentation

## 2023-07-22 DIAGNOSIS — R109 Unspecified abdominal pain: Secondary | ICD-10-CM | POA: Diagnosis present

## 2023-07-22 LAB — URINALYSIS, ROUTINE W REFLEX MICROSCOPIC
Bilirubin Urine: NEGATIVE
Glucose, UA: NEGATIVE mg/dL
Hgb urine dipstick: NEGATIVE
Ketones, ur: NEGATIVE mg/dL
Nitrite: NEGATIVE
Protein, ur: NEGATIVE mg/dL
Specific Gravity, Urine: 1.008 (ref 1.005–1.030)
pH: 6 (ref 5.0–8.0)

## 2023-07-22 LAB — COMPREHENSIVE METABOLIC PANEL
ALT: 8 U/L (ref 0–44)
AST: 13 U/L — ABNORMAL LOW (ref 15–41)
Albumin: 3.2 g/dL — ABNORMAL LOW (ref 3.5–5.0)
Alkaline Phosphatase: 77 U/L (ref 38–126)
Anion gap: 7 (ref 5–15)
BUN: 23 mg/dL (ref 8–23)
CO2: 28 mmol/L (ref 22–32)
Calcium: 9.6 mg/dL (ref 8.9–10.3)
Chloride: 105 mmol/L (ref 98–111)
Creatinine, Ser: 1.44 mg/dL — ABNORMAL HIGH (ref 0.44–1.00)
GFR, Estimated: 35 mL/min — ABNORMAL LOW (ref >60)
Glucose, Bld: 98 mg/dL (ref 70–99)
Potassium: 3.6 mmol/L (ref 3.5–5.1)
Sodium: 140 mmol/L (ref 135–145)
Total Bilirubin: 0.5 mg/dL (ref 0.0–1.2)
Total Protein: 6.3 g/dL — ABNORMAL LOW (ref 6.5–8.1)

## 2023-07-22 LAB — CBC
HCT: 41.3 % (ref 36.0–46.0)
Hemoglobin: 13.4 g/dL (ref 12.0–15.0)
MCH: 29.9 pg (ref 26.0–34.0)
MCHC: 32.4 g/dL (ref 30.0–36.0)
MCV: 92.2 fL (ref 80.0–100.0)
Platelets: 174 10*3/uL (ref 150–400)
RBC: 4.48 MIL/uL (ref 3.87–5.11)
RDW: 12.6 % (ref 11.5–15.5)
WBC: 6.5 10*3/uL (ref 4.0–10.5)
nRBC: 0 % (ref 0.0–0.2)

## 2023-07-22 LAB — LIPASE, BLOOD: Lipase: 45 U/L (ref 11–51)

## 2023-07-22 MED ORDER — CEFUROXIME AXETIL 250 MG PO TABS: 250.0000 mg | ORAL_TABLET | Freq: Two times a day (BID) | ORAL | 0 refills | Status: DC

## 2023-07-22 MED ORDER — CEFUROXIME AXETIL 250 MG PO TABS
250.0000 mg | ORAL_TABLET | Freq: Once | ORAL | Status: AC
  Administered 2023-07-22: 250 mg via ORAL
  Filled 2023-07-22: qty 1

## 2023-07-22 NOTE — ED Triage Notes (Signed)
 First nurse note: Pt here via [facility] from [facility] assisted living. Pt c/o of back pain that is possibly chronic but unknown. VSS per EMS

## 2023-07-22 NOTE — ED Provider Notes (Signed)
 [facility] Provider Note   Event Date/Time   First MD Initiated Contact with Patient 07/22/23 1720     (approximate) History  Back Pain and Abdominal Pain  HPI Roberta Ross is a 89 y.o. female with a past medical history of hypertension, rheumatoid arthritis, COPD, and hyperlipidemia who presents complaining of "my spine hurts".  Patient states that the symptoms are similar to when she has had urinary tract infections in the past.  Patient states that approximately 1 week prior to arrival she woke up with significant back pain that has kept her from wanting to move or walk over the last week. ROS: Patient currently denies any fever, vision changes, tinnitus, difficulty speaking, facial droop, sore throat, chest pain, shortness of breath, abdominal pain, nausea/vomiting/diarrhea, dysuria, or weakness/numbness/paresthesias in any extremity   Physical Exam  Triage Vital Signs: ED Triage Vitals [07/22/23 1226]  Encounter Vitals Group     BP 105/88     Systolic BP Percentile      Diastolic BP Percentile      Pulse Rate 94     Resp 18     Temp (!) 97.5 F (36.4 C)     Temp Source Oral     SpO2 100 %     Weight 135 lb (61.2 kg)     Height 5\' 5"  (1.651 m)     Head Circumference      Peak Flow      Pain Score 10     Pain Loc      Pain Education      Exclude from Growth Chart    Most recent vital signs: Vitals:   07/22/23 1226 07/22/23 1718  BP: 105/88 109/75  Pulse: 94 74  Resp: 18 17  Temp: (!) 97.5 F (36.4 C) 98.1 F (36.7 C)  SpO2: 100% 97%   General: Awake, oriented x4. CV:  Good peripheral perfusion.  Resp:  Normal effort.  Abd:  No distention.  Other:  Elderly well-developed, well-nourished Caucasian female resting comfortably in no acute distress.  Severe ulnar deviation of metacarpophalangeal joints and bilateral hands ED Results / Procedures / Treatments  Labs (all labs ordered are listed, but only abnormal results are displayed) Labs  Reviewed  COMPREHENSIVE METABOLIC PANEL - Abnormal; Notable for the following components:      Result Value   Creatinine, Ser 1.44 (*)    Total Protein 6.3 (*)    Albumin 3.2 (*)    AST 13 (*)    GFR, Estimated 35 (*)    All other components within normal limits  URINALYSIS, ROUTINE W REFLEX MICROSCOPIC - Abnormal; Notable for the following components:   Color, Urine YELLOW (*)    APPearance CLEAR (*)    Leukocytes,Ua TRACE (*)    Bacteria, UA RARE (*)    All other components within normal limits  LIPASE, BLOOD  CBC  RADIOLOGY ED MD interpretation: X-ray of the lumbar spine shows scoliosis with no other acute abnormalities.  This imaging was independently interpreted -Agree with radiology assessment Official radiology report(s): No results found. PROCEDURES: Critical Care performed: No Procedures MEDICATIONS ORDERED IN ED: Medications  cefUROXime (CEFTIN) tablet 250 mg (has no administration in time range)   IMPRESSION / MDM / ASSESSMENT AND PLAN / ED COURSE  I reviewed the triage vital signs and the nursing notes.  The patient is on the cardiac monitor to evaluate for evidence of arrhythmia and/or significant heart rate changes. Patient's presentation is most consistent with acute presentation with potential threat to life or bodily function. The 89 year old female presents for chronic entirety of back pain as well as urinary frequency.  Urinalysis has shown positive for leukocytes and bacteria Not Pregnant. Unlikely TOA, Ovarian Torsion, PID, gonorrhea/chlamydia. Low suspicion for Infected Urolithiasis, AAA, Cholecystitis, Pancreatitis, SBO, Appendicitis, or other acute abdomen.  Rx: Ceftin 250 mg BID for 5 days Disposition: Discharge home. SRP discussed. Advise follow up with primary care provider within 24-72 hours.   FINAL CLINICAL IMPRESSION(S) / ED DIAGNOSES   Final diagnoses:  Lower urinary tract infectious disease  Chronic midline back  pain, unspecified back location   Rx / DC Orders   ED Discharge Orders          Ordered    cefUROXime (CEFTIN) 250 MG tablet  2 times daily with meals        07/22/23 2007           Note:  This document was prepared using Dragon voice recognition software and may include unintentional dictation errors.   Merwyn Katos, MD 07/22/23 2008

## 2023-07-22 NOTE — ED Triage Notes (Signed)
 Patient to ED via [facility] from [facility] for back pain. Pt keeps reporting "my spine hurts." Ongoing since Wednesday. States normally walks but hasn't been recently. Visitor reports that she is having abd pain. Denies N/V/D. States her scalp is also itching.

## 2023-07-22 NOTE — ED Notes (Signed)
 Called report to [employer], spoke with [place]

## 2023-07-22 NOTE — ED Notes (Signed)
 See triage note  Presents with back pain  States this started last week  No injury

## 2023-08-01 ENCOUNTER — Emergency Department

## 2023-08-01 ENCOUNTER — Emergency Department
Admission: EM | Admit: 2023-08-01 | Discharge: 2023-08-01 | Disposition: A | Discharge status: Home / Self Care | Attending: Emergency Medicine | Admitting: Emergency Medicine

## 2023-08-01 ENCOUNTER — Other Ambulatory Visit: Payer: Self-pay

## 2023-08-01 DIAGNOSIS — R410 Disorientation, unspecified: Secondary | ICD-10-CM | POA: Diagnosis present

## 2023-08-01 LAB — URINALYSIS, ROUTINE W REFLEX MICROSCOPIC
Bilirubin Urine: NEGATIVE
Glucose, UA: NEGATIVE mg/dL
Hgb urine dipstick: NEGATIVE
Ketones, ur: NEGATIVE mg/dL
Nitrite: NEGATIVE
Protein, ur: NEGATIVE mg/dL
Specific Gravity, Urine: 1.006 (ref 1.005–1.030)
pH: 5 (ref 5.0–8.0)

## 2023-08-01 LAB — HEPATIC FUNCTION PANEL
ALT: 10 U/L (ref 0–44)
AST: 16 U/L (ref 15–41)
Albumin: 3.1 g/dL — ABNORMAL LOW (ref 3.5–5.0)
Alkaline Phosphatase: 74 U/L (ref 38–126)
Bilirubin, Direct: 0.1 mg/dL (ref 0.0–0.2)
Indirect Bilirubin: 0.6 mg/dL (ref 0.3–0.9)
Total Bilirubin: 0.7 mg/dL (ref 0.0–1.2)
Total Protein: 6.3 g/dL — ABNORMAL LOW (ref 6.5–8.1)

## 2023-08-01 LAB — BASIC METABOLIC PANEL WITH GFR
Anion gap: 10 (ref 5–15)
BUN: 22 mg/dL (ref 8–23)
CO2: 25 mmol/L (ref 22–32)
Calcium: 9.6 mg/dL (ref 8.9–10.3)
Chloride: 104 mmol/L (ref 98–111)
Creatinine, Ser: 1.66 mg/dL — ABNORMAL HIGH (ref 0.44–1.00)
GFR, Estimated: 30 mL/min — ABNORMAL LOW (ref >60)
Glucose, Bld: 110 mg/dL — ABNORMAL HIGH (ref 70–99)
Potassium: 3.6 mmol/L (ref 3.5–5.1)
Sodium: 139 mmol/L (ref 135–145)

## 2023-08-01 LAB — CBC
HCT: 38.4 % (ref 36.0–46.0)
Hemoglobin: 12.4 g/dL (ref 12.0–15.0)
MCH: 30.2 pg (ref 26.0–34.0)
MCHC: 32.3 g/dL (ref 30.0–36.0)
MCV: 93.7 fL (ref 80.0–100.0)
Platelets: 163 10*3/uL (ref 150–400)
RBC: 4.1 MIL/uL (ref 3.87–5.11)
RDW: 13.2 % (ref 11.5–15.5)
WBC: 7.3 10*3/uL (ref 4.0–10.5)
nRBC: 0 % (ref 0.0–0.2)

## 2023-08-01 MED ORDER — SODIUM CHLORIDE 0.9 % IV BOLUS
1000.0000 mL | Freq: Once | INTRAVENOUS | Status: AC
  Administered 2023-08-01: 1000 mL via INTRAVENOUS

## 2023-08-01 NOTE — ED Triage Notes (Signed)
 Pt here via [facility] from [facility] after an unwitnessed fall. Pt was assessed at the facility but sent to ED for evaluation. Pt denies pain or hitting her head. Family states that pt has been altered recently. Later pt told someone that she hit her head and her tailbone. Pt states now she cannot walk.

## 2023-08-01 NOTE — ED Notes (Signed)
 Ambulated patient with a walker and 2 assist.  Patient ambulated independently with her assistive device without difficulty or complaint.  Provider notified.

## 2023-08-01 NOTE — ED Notes (Addendum)
 This tech along with [place] student, assisted patient on and off bedpan, along with changing patient's sheet, blanket, bedpad, and brief.

## 2023-08-01 NOTE — ED Triage Notes (Signed)
 First Nurse Note;  Pt via [facility] from [facility]. Pt unwitnessed fall yesterday, positive for head injury. Report today pt is more altered than normal. Per EMS pt able to answer all questions. Pt c/o tailbone pain. Denies blood thinners. Pt is A&OX4 and NAD  100/palp  121 HR  91% on RA, placed on 2L [place]

## 2023-08-01 NOTE — ED Provider Notes (Signed)
 [facility] Provider Note    Event Date/Time   First MD Initiated Contact with Patient 08/01/23 1510     (approximate)   History   Fall and Altered Mental Status   HPI  Roberta Ross is a 89 y.o. female who presents to the emergency department today with concerns for fall and confusion.  Patient apparently had a fall at her living facility yesterday.  Then at 3 AM this morning she called 911.  She thinks she was having hallucinations.  Family at bedside states she has been confused today.  States she does not have a history of dementia.  The patient is complaining of back pain but states this has been present for 3 weeks.  She was seen in the emergency department roughly 10 days ago and diagnosed with UTI.  She was complaining of back pain at that time as well.     Physical Exam   Triage Vital Signs: ED Triage Vitals  Encounter Vitals Group     BP 08/01/23 1159 98/64     Systolic BP Percentile --      Diastolic BP Percentile --      Pulse Rate 08/01/23 1159 74     Resp 08/01/23 1159 17     Temp 08/01/23 1159 98.2 F (36.8 C)     Temp Source 08/01/23 1159 Oral     SpO2 08/01/23 1200 91 %     Weight 08/01/23 1157 134 lb 14.7 oz (61.2 kg)     Height 08/01/23 1157 5\' 5"  (1.651 m)     Head Circumference --      Peak Flow --      Pain Score 08/01/23 1156 10     Pain Loc --      Pain Education --      Exclude from Growth Chart --     Most recent vital signs: Vitals:   08/01/23 1159 08/01/23 1200  BP: 98/64   Pulse: 74   Resp: 17   Temp: 98.2 F (36.8 C)   SpO2:  91%   General: Awake, alert, slightly agitated, oriented. CV:  Good peripheral perfusion. Regular rate and rhythm. Resp:  Normal effort. Lungs clear. Abd:  No distention.  Other:  Does not appear to be responding to internal stimuli.    ED Results / Procedures / Treatments   Labs (all labs ordered are listed, but only abnormal results are displayed) Labs Reviewed  BASIC  METABOLIC PANEL WITH GFR - Abnormal; Notable for the following components:      Result Value   Glucose, Bld 110 (*)    Creatinine, Ser 1.66 (*)    GFR, Estimated 30 (*)    All other components within normal limits  HEPATIC FUNCTION PANEL - Abnormal; Notable for the following components:   Total Protein 6.3 (*)    Albumin 3.1 (*)    All other components within normal limits  CBC     EKG  I, Phineas Semen, attending physician, personally viewed and interpreted this EKG  EKG Time: 1159 Rate: 92 Rhythm: atrial fibrillation Axis: normal Intervals: qtc 442 QRS: narrow ST changes: no st elevation Impression: abnormal ekg   RADIOLOGY I independently interpreted and visualized the CT head/cervical spine. My interpretation: No ICH, no fracture Radiology interpretation:  IMPRESSION:  1. No acute intracranial pathology. Moderate age-related atrophy and  chronic microvascular ischemic changes.  2. No acute/traumatic cervical spine pathology.    I independently interpreted and visualized the CT lumbar  spine. My interpretation: No fracture Radiology interpretation:  IMPRESSION:  1. No acute lumbar spine fracture. Please note that the distal  sacrum and coccyx are not included on this exam due to slice  selection.  2. Multilevel thoracolumbar spondylosis, without appreciable change  since prior study.      PROCEDURES:  Critical Care performed: No  MEDICATIONS ORDERED IN ED: Medications - No data to display   IMPRESSION / MDM / ASSESSMENT AND PLAN / ED COURSE  I reviewed the triage vital signs and the nursing notes.                              Differential diagnosis includes, but is not limited to, ICH, UTI, CVA, electrolyte abnormality, anemia  Patient's presentation is most consistent with acute presentation with potential threat to life or bodily function.   The patient is on the cardiac monitor to evaluate for evidence of arrhythmia and/or significant heart  rate changes.  Patient presented to the emergency department today because of concerns for fall and altered mental status.  On exam patient is awake and alert.  Does not appear to be responding to any internal stimuli. Head CT and cervical spine CT without concerning abnormalities. Blood work with slight AKI. Do have concern for possible UTI.   UA without concerning abnormalities.  I did get a chest x-ray which also did not show signs of infection.  I did discuss with the patient and family my concern that then for possible stroke causing altered mental status.  However at this time patient states she does not want an MRI.  We did discuss possibility of stroke.  However patient again declined.  At this time will plan on discharging.  Did encourage patient to follow-up closely with primary care.      FINAL CLINICAL IMPRESSION(S) / ED DIAGNOSES   Final diagnoses:  Confusion     Note:  This document was prepared using Dragon voice recognition software and may include unintentional dictation errors.    Phineas Semen, MD 08/01/23 2152

## 2023-08-01 NOTE — ED Provider Triage Note (Signed)
 Emergency Medicine Provider Triage Evaluation Note  Roberta Ross , a 89 y.o. female  was evaluated in triage.  Pt complains of fall, altered mental status.  Review of Systems  Positive:  Negative:   Physical Exam  BP 98/64   Pulse 74   Temp 98.2 F (36.8 C) (Oral)   Resp 17   Ht 5\' 5"  (1.651 m)   Wt 61.2 kg   SpO2 91%   BMI 22.45 kg/m  Gen:   Awake, no distress   Resp:  Normal effort  MSK:   Moves extremities without difficulty, tender along lumbar spine Other:    Medical Decision Making  Medically screening exam initiated at 12:05 PM.  Appropriate orders placed.  Roberta Ross was informed that the remainder of the evaluation will be completed by another provider, this initial triage assessment does not replace that evaluation, and the importance of remaining in the ED until their evaluation is complete.     Faythe Ghee, PA-C 08/01/23 1206

## 2023-10-12 ENCOUNTER — Other Ambulatory Visit: Payer: Self-pay

## 2023-10-12 ENCOUNTER — Emergency Department
Admission: EM | Admit: 2023-10-12 | Discharge: 2023-10-12 | Disposition: A | Discharge status: Home / Self Care | Attending: Emergency Medicine | Admitting: Emergency Medicine

## 2023-10-12 ENCOUNTER — Emergency Department

## 2023-10-12 DIAGNOSIS — R350 Frequency of micturition: Secondary | ICD-10-CM | POA: Diagnosis not present

## 2023-10-12 DIAGNOSIS — F0390 Unspecified dementia without behavioral disturbance: Secondary | ICD-10-CM | POA: Diagnosis not present

## 2023-10-12 DIAGNOSIS — R109 Unspecified abdominal pain: Secondary | ICD-10-CM | POA: Diagnosis not present

## 2023-10-12 DIAGNOSIS — Z87440 Personal history of urinary (tract) infections: Secondary | ICD-10-CM | POA: Diagnosis not present

## 2023-10-12 DIAGNOSIS — R443 Hallucinations, unspecified: Secondary | ICD-10-CM | POA: Insufficient documentation

## 2023-10-12 DIAGNOSIS — I700 Atherosclerosis of aorta: Secondary | ICD-10-CM | POA: Insufficient documentation

## 2023-10-12 LAB — URINALYSIS, ROUTINE W REFLEX MICROSCOPIC
Bacteria, UA: NONE SEEN
Bilirubin Urine: NEGATIVE
Glucose, UA: NEGATIVE mg/dL
Hgb urine dipstick: NEGATIVE
Ketones, ur: NEGATIVE mg/dL
Nitrite: NEGATIVE
Protein, ur: NEGATIVE mg/dL
Specific Gravity, Urine: 1.008 (ref 1.005–1.030)
pH: 6 (ref 5.0–8.0)

## 2023-10-12 LAB — COMPREHENSIVE METABOLIC PANEL WITH GFR
ALT: 8 U/L (ref 0–44)
AST: 14 U/L — ABNORMAL LOW (ref 15–41)
Albumin: 3.3 g/dL — ABNORMAL LOW (ref 3.5–5.0)
Alkaline Phosphatase: 74 U/L (ref 38–126)
Anion gap: 10 (ref 5–15)
BUN: 21 mg/dL (ref 8–23)
CO2: 25 mmol/L (ref 22–32)
Calcium: 9.6 mg/dL (ref 8.9–10.3)
Chloride: 104 mmol/L (ref 98–111)
Creatinine, Ser: 1.36 mg/dL — ABNORMAL HIGH (ref 0.44–1.00)
GFR, Estimated: 38 mL/min — ABNORMAL LOW (ref >60)
Glucose, Bld: 102 mg/dL — ABNORMAL HIGH (ref 70–99)
Potassium: 4.1 mmol/L (ref 3.5–5.1)
Sodium: 139 mmol/L (ref 135–145)
Total Bilirubin: 0.8 mg/dL (ref 0.0–1.2)
Total Protein: 6.4 g/dL — ABNORMAL LOW (ref 6.5–8.1)

## 2023-10-12 LAB — CBC WITH DIFFERENTIAL/PLATELET
Abs Immature Granulocytes: 0.04 10*3/uL (ref 0.00–0.07)
Basophils Absolute: 0 10*3/uL (ref 0.0–0.1)
Basophils Relative: 0 %
Eosinophils Absolute: 0.5 10*3/uL (ref 0.0–0.5)
Eosinophils Relative: 9 %
HCT: 40.5 % (ref 36.0–46.0)
Hemoglobin: 13.2 g/dL (ref 12.0–15.0)
Immature Granulocytes: 1 %
Lymphocytes Relative: 26 %
Lymphs Abs: 1.5 10*3/uL (ref 0.7–4.0)
MCH: 31 pg (ref 26.0–34.0)
MCHC: 32.6 g/dL (ref 30.0–36.0)
MCV: 95.1 fL (ref 80.0–100.0)
Monocytes Absolute: 0.4 10*3/uL (ref 0.1–1.0)
Monocytes Relative: 7 %
Neutro Abs: 3.3 10*3/uL (ref 1.7–7.7)
Neutrophils Relative %: 57 %
Platelets: 168 10*3/uL (ref 150–400)
RBC: 4.26 MIL/uL (ref 3.87–5.11)
RDW: 14.4 % (ref 11.5–15.5)
WBC: 5.8 10*3/uL (ref 4.0–10.5)
nRBC: 0 % (ref 0.0–0.2)

## 2023-10-12 LAB — TROPONIN I (HIGH SENSITIVITY): Troponin I (High Sensitivity): 8 ng/L (ref <18)

## 2023-10-12 MED ORDER — SODIUM CHLORIDE 0.9 % IV BOLUS
500.0000 mL | Freq: Once | INTRAVENOUS | Status: AC
  Administered 2023-10-12: 500 mL via INTRAVENOUS

## 2023-10-12 NOTE — ED Notes (Signed)
 Lactic sent on ice with save tube label

## 2023-10-12 NOTE — Discharge Instructions (Addendum)
 Please see your primary care doctor and be evaluated within the next couple of days for ongoing outpatient assessment.  If you have any severe worsening of mental status symptoms, return back to the ED as you may require admission at that time.  No signs of infection anywhere and CT imaging of your head is normal.  We do see constipation on imaging of your abdomen.

## 2023-10-12 NOTE — ED Triage Notes (Signed)
 Pt arrives from [facility] assisted living fro confusion  Pt states she saw a snake on her bed Pt will say things out of the blue Calls daughter today and states she was going to take all of the money out of the bank Family and facility concerned for UTI as she has chronic UTIs and hallucinations in past  EMS: 97.6 orally, CBG 106, BP: 125/87 Map 101, Pulse 70 EMS assessment: WNL

## 2023-10-12 NOTE — ED Notes (Signed)
 Pt to CT

## 2023-10-12 NOTE — ED Provider Notes (Signed)
  Physical Exam  BP 107/70   Pulse 88   Temp 97.7 F (36.5 C) (Oral)   Ht 5' 5 (1.651 m)   Wt 69.4 kg   SpO2 97%   BMI 25.48 kg/m   Physical Exam  Procedures  Procedures  ED Course / MDM    Medical Decision Making Amount and/or Complexity of Data Reviewed Labs: ordered. Radiology: ordered.   Received signout on patient.  89 year old female presenting today for concerns of hallucinations.  Reportedly had symptoms similar to this a couple months ago and was diagnosed with UTI.  Otherwise at her baseline mental status with unremarkable physical exam.  Seen by initial provider with reassuring exam, and stable vital signs.  CBC and CMP consistent with her baseline.  Troponin negative.  Signed out pending UA and CT imaging.  CT imaging of head and cervical spine is negative.  CT imaging of abdomen shows constipation but no other acute pathology.  Her UA is negative for infection.  Do not have an obvious source of her hallucinations and she denies any known diagnosis of dementia although does have family history.  I discussed with patient and daughter regarding possible admission for ongoing workup of hallucinations versus outpatient follow-up.  Patient stated she did not want to be admitted to the hospital and preferred to have this done outpatient as she felt at her baseline at this time.  Will have her follow-up closely with her PCP and they were given strict return precautions.  Patient and daughter were agreeable with this plan.     Kandee Orion, MD 10/12/23 [phone]

## 2023-10-12 NOTE — ED Provider Notes (Addendum)
 [facility] Provider Note    Event Date/Time   First MD Initiated Contact with Patient 10/12/23 1356     (approximate)   History   Urinary Frequency   HPI  Roberta Ross is a 89 y.o. female with history of dementia who comes in with concerns for some hallucinations.  Family reports concerns that she could have a UTI.  Today they reported some increasing hallucinations with snakes on the wall she has had UTIs previously that have caused some hallucinations.  They deny any other concerns.  Physical Exam   Triage Vital Signs: ED Triage Vitals  Encounter Vitals Group     BP 10/12/23 1352 107/70     Girls Systolic BP Percentile --      Girls Diastolic BP Percentile --      Boys Systolic BP Percentile --      Boys Diastolic BP Percentile --      Pulse Rate 10/12/23 1352 88     Resp --      Temp 10/12/23 1356 97.7 F (36.5 C)     Temp Source 10/12/23 1356 Oral     SpO2 10/12/23 1352 97 %     Weight 10/12/23 1357 153 lb 1.6 oz (69.4 kg)     Height 10/12/23 1357 5' 5 (1.651 m)     Head Circumference --      Peak Flow --      Pain Score 10/12/23 1356 4     Pain Loc --      Pain Education --      Exclude from Growth Chart --     Most recent vital signs: Vitals:   10/12/23 1352 10/12/23 1356  BP: 107/70   Pulse: 88   Temp:  97.7 F (36.5 C)  SpO2: 97%      General: Awake, no distress.  CV:  Good peripheral perfusion.  Resp:  Normal effort.  Abd:  No distention.  Soft nontender Other:  Alert and oriented x 3 moving all extremities   ED Results / Procedures / Treatments   Labs (all labs ordered are listed, but only abnormal results are displayed) Labs Reviewed  COMPREHENSIVE METABOLIC PANEL WITH GFR - Abnormal; Notable for the following components:      Result Value   Glucose, Bld 102 (*)    Creatinine, Ser 1.36 (*)    Total Protein 6.4 (*)    Albumin 3.3 (*)    AST 14 (*)    GFR, Estimated 38 (*)    All other components within  normal limits  URINE CULTURE  CBC WITH DIFFERENTIAL/PLATELET  URINALYSIS, ROUTINE W REFLEX MICROSCOPIC  TROPONIN I (HIGH SENSITIVITY)     EKG  My interpretation of EKG:  Atrial fibrillation with a rate of 91 without any ST elevation or T wave inversions, normal intervals  RADIOLOGY Pending     PROCEDURES:  Critical Care performed: No  Procedures   MEDICATIONS ORDERED IN ED: Medications - No data to display   IMPRESSION / MDM / ASSESSMENT AND PLAN / ED COURSE  I reviewed the triage vital signs and the nursing notes.   Patient's presentation is most consistent with acute presentation with potential threat to life or bodily function.   Patient comes in with concerns for some altered mental status, confusion.  CT imaging ordered given she does have some history of falls to ensure no evidence of intracranial hemorrhage, cervical fracture and CT renal to ensure no signs of kidney stone, significant  bladder retention.  Will get urine evaluate for UTI  Troponin negative CBC reassuring CMP shows stable creatinine.  Patient in rate controlled A-fib but I reviewed the notes and patient has a history of atrial fibrillation on Cardizem  not on anticoagulation secondary to falls and intracranial bleeds   Patient we had an off to oncoming team pending CT imaging and urine      FINAL CLINICAL IMPRESSION(S) / ED DIAGNOSES   Final diagnoses:  Hallucinations     Rx / DC Orders   ED Discharge Orders     None        Note:  This document was prepared using Dragon voice recognition software and may include unintentional dictation errors.   Lubertha Rush, MD 10/12/23 1500    Lubertha Rush, MD 10/12/23 1539    Lubertha Rush, MD 10/12/23 1540

## 2023-10-13 LAB — URINE CULTURE: Culture: <10000 — AB

## 2024-01-03 ENCOUNTER — Telehealth (HOSPITAL_COMMUNITY): Payer: Self-pay | Admitting: Pharmacy Technician

## 2024-01-03 ENCOUNTER — Other Ambulatory Visit (HOSPITAL_COMMUNITY): Payer: Self-pay

## 2024-01-03 ENCOUNTER — Emergency Department
Admission: EM | Admit: 2024-01-03 | Discharge: 2024-01-03 | Disposition: A | Discharge status: Home / Self Care | Attending: Emergency Medicine | Admitting: Emergency Medicine

## 2024-01-03 ENCOUNTER — Other Ambulatory Visit: Payer: Self-pay

## 2024-01-03 ENCOUNTER — Emergency Department

## 2024-01-03 DIAGNOSIS — J449 Chronic obstructive pulmonary disease, unspecified: Secondary | ICD-10-CM | POA: Diagnosis not present

## 2024-01-03 DIAGNOSIS — I509 Heart failure, unspecified: Secondary | ICD-10-CM | POA: Diagnosis not present

## 2024-01-03 DIAGNOSIS — I82413 Acute embolism and thrombosis of femoral vein, bilateral: Secondary | ICD-10-CM | POA: Insufficient documentation

## 2024-01-03 DIAGNOSIS — I110 Hypertensive heart disease with heart failure: Secondary | ICD-10-CM | POA: Insufficient documentation

## 2024-01-03 DIAGNOSIS — M7989 Other specified soft tissue disorders: Secondary | ICD-10-CM | POA: Diagnosis present

## 2024-01-03 LAB — PROTIME-INR
INR: 1.1 (ref 0.8–1.2)
Prothrombin Time: 14.4 s (ref 11.4–15.2)

## 2024-01-03 LAB — CBC WITH DIFFERENTIAL/PLATELET
Abs Immature Granulocytes: 0.01 K/uL (ref 0.00–0.07)
Basophils Absolute: 0 K/uL (ref 0.0–0.1)
Basophils Relative: 1 %
Eosinophils Absolute: 0.3 K/uL (ref 0.0–0.5)
Eosinophils Relative: 5 %
HCT: 40.5 % (ref 36.0–46.0)
Hemoglobin: 13.1 g/dL (ref 12.0–15.0)
Immature Granulocytes: 0 %
Lymphocytes Relative: 23 %
Lymphs Abs: 1.4 K/uL (ref 0.7–4.0)
MCH: 32.3 pg (ref 26.0–34.0)
MCHC: 32.3 g/dL (ref 30.0–36.0)
MCV: 99.8 fL (ref 80.0–100.0)
Monocytes Absolute: 0.4 K/uL (ref 0.1–1.0)
Monocytes Relative: 7 %
Neutro Abs: 3.8 K/uL (ref 1.7–7.7)
Neutrophils Relative %: 64 %
Platelets: 184 K/uL (ref 150–400)
RBC: 4.06 MIL/uL (ref 3.87–5.11)
RDW: 13.1 % (ref 11.5–15.5)
WBC: 5.9 K/uL (ref 4.0–10.5)
nRBC: 0 % (ref 0.0–0.2)

## 2024-01-03 LAB — COMPREHENSIVE METABOLIC PANEL WITH GFR
ALT: 7 U/L (ref 0–44)
AST: 14 U/L — ABNORMAL LOW (ref 15–41)
Albumin: 3.8 g/dL (ref 3.5–5.0)
Alkaline Phosphatase: 75 U/L (ref 38–126)
Anion gap: 10 (ref 5–15)
BUN: 28 mg/dL — ABNORMAL HIGH (ref 8–23)
CO2: 27 mmol/L (ref 22–32)
Calcium: 10.2 mg/dL (ref 8.9–10.3)
Chloride: 105 mmol/L (ref 98–111)
Creatinine, Ser: 1.64 mg/dL — ABNORMAL HIGH (ref 0.44–1.00)
GFR, Estimated: 30 mL/min — ABNORMAL LOW (ref >60)
Glucose, Bld: 118 mg/dL — ABNORMAL HIGH (ref 70–99)
Potassium: 4.4 mmol/L (ref 3.5–5.1)
Sodium: 142 mmol/L (ref 135–145)
Total Bilirubin: 0.7 mg/dL (ref 0.0–1.2)
Total Protein: 7 g/dL (ref 6.5–8.1)

## 2024-01-03 LAB — MAGNESIUM: Magnesium: 2.2 mg/dL (ref 1.7–2.4)

## 2024-01-03 LAB — APTT: aPTT: 32 s (ref 24–36)

## 2024-01-03 MED ORDER — APIXABAN (ELIQUIS) VTE STARTER PACK (10MG AND 5MG): ORAL_TABLET | ORAL | 0 refills | Status: DC

## 2024-01-03 MED ORDER — IOHEXOL 350 MG/ML SOLN
50.0000 mL | Freq: Once | INTRAVENOUS | Status: AC | PRN
  Administered 2024-01-03: 50 mL via INTRAVENOUS

## 2024-01-03 MED ORDER — APIXABAN 5 MG PO TABS
10.0000 mg | ORAL_TABLET | Freq: Two times a day (BID) | ORAL | Status: DC
  Administered 2024-01-03: 10 mg via ORAL
  Filled 2024-01-03: qty 2

## 2024-01-03 NOTE — ED Triage Notes (Signed)
 US  done at [facility]. No report provided.

## 2024-01-03 NOTE — ED Provider Notes (Signed)
 [facility] Provider Note    Event Date/Time   First MD Initiated Contact with Patient 01/03/24 1012     (approximate)   History   Leg Pain   HPI  Roberta Ross is a 89 year old female with history of A-fib, HTN, CHF, COPD presenting to the emergency department for evaluation of leg pain.  On Sunday, family noticed that patient had onset of some discoloration of her lower legs.  Since then, she has had development of redness and swelling in her legs.  She had a ultrasound performed yesterday demonstrating bilateral DVTs, per report unclear if acute or chronic.  Patient sent to the ER in the setting of this.  She does report that she has had ongoing shortness of breath related to her COPD, unsure if this has worsened over the past few days.  No chest pain.  No history of prior VTE.     Physical Exam   Triage Vital Signs: ED Triage Vitals  Encounter Vitals Group     BP 01/03/24 0947 117/63     Girls Systolic BP Percentile --      Girls Diastolic BP Percentile --      Boys Systolic BP Percentile --      Boys Diastolic BP Percentile --      Pulse Rate 01/03/24 0947 98     Resp 01/03/24 0947 16     Temp 01/03/24 0947 (!) 97.5 F (36.4 C)     Temp Source 01/03/24 0947 Oral     SpO2 01/03/24 0952 91 %     Weight 01/03/24 0950 153 lb (69.4 kg)     Height --      Head Circumference --      Peak Flow --      Pain Score 01/03/24 0949 8     Pain Loc --      Pain Education --      Exclude from Growth Chart --     Most recent vital signs: Vitals:   01/03/24 1300 01/03/24 1330  BP: 115/68 105/74  Pulse: 92 81  Resp:  18  Temp:  98.1 F (36.7 C)  SpO2: 100% 100%     General: Awake, interactive  CV:  Regular rate, good peripheral perfusion.  Resp:  Unlabored respirations, lungs clear to auscultation Abd:  Nondistended.  Neuro:  Symmetric facial movement, fluid speech MSK:  There is erythema and pitting edema of the bilateral lower extremities,  worse on the right.  There are intact DP pulses.  There are some ecchymotic changes over the thighs.  Family has picture from Sunday where patient does have skin discoloration, but redness and swelling appear largely new since that time.   ED Results / Procedures / Treatments   Labs (all labs ordered are listed, but only abnormal results are displayed) Labs Reviewed  COMPREHENSIVE METABOLIC PANEL WITH GFR - Abnormal; Notable for the following components:      Result Value   Glucose, Bld 118 (*)    BUN 28 (*)    Creatinine, Ser 1.64 (*)    AST 14 (*)    GFR, Estimated 30 (*)    All other components within normal limits  CBC WITH DIFFERENTIAL/PLATELET  MAGNESIUM   PROTIME-INR  APTT     EKG EKG independently reviewed and interpreted by myself demonstrates:   RADIOLOGY Imaging independently reviewed and interpreted by myself demonstrates:  US  demonstrates nonocclusive DVT of the bilateral femoral and greater saphenous veins CTA without PE  Formal Radiology Read:  US  Venous Img Lower Bilateral Addendum Date: 01/03/2024 ADDENDUM REPORT: 01/03/2024 13:11 ADDENDUM: Results were discussed with Dr. Nilsa Dade at 1:07 p.m. [place] on January 03, 2024. Electronically Signed   By: Suzen Dials M.D.   On: 01/03/2024 13:11   Result Date: 01/03/2024 CLINICAL DATA:  Lower leg pain. EXAM: BILATERAL LOWER EXTREMITY VENOUS DOPPLER ULTRASOUND TECHNIQUE: Gray-scale sonography with graded compression, as well as color Doppler and duplex ultrasound were performed to evaluate the lower extremity deep venous systems from the level of the common femoral vein and including the common femoral, femoral, profunda femoral, popliteal and calf veins including the posterior tibial, peroneal and gastrocnemius veins when visible. The superficial great saphenous vein was also interrogated. Spectral Doppler was utilized to evaluate flow at rest and with distal augmentation maneuvers in the common femoral, femoral and  popliteal veins. COMPARISON:  None Available. FINDINGS: RIGHT LOWER EXTREMITY Common Femoral Vein: No evidence of thrombus. Normal compressibility, respiratory phasicity and response to augmentation. Saphenofemoral Junction: Evidence of nonocclusive thrombus with abnormal compressibility and flow on color Doppler imaging. Profunda Femoral Vein: No evidence of thrombus. Normal compressibility and flow on color Doppler imaging. Femoral Vein: Evidence of nonocclusive thrombus with abnormal compressibility, respiratory phasicity and response to augmentation. Popliteal Vein: No evidence of thrombus. Normal compressibility, respiratory phasicity and response to augmentation. Calf Veins: The RIGHT posterior tibial vein and RIGHT peroneal vein are limited in visualization. Superficial Great Saphenous Vein: No evidence of thrombus. Normal compressibility. Venous Reflux:  None. Other Findings:  None. LEFT LOWER EXTREMITY Common Femoral Vein: No evidence of thrombus. Normal compressibility, respiratory phasicity and response to augmentation. Saphenofemoral Junction: Evidence of nonocclusive thrombus with abnormal compressibility and flow on color Doppler imaging. Profunda Femoral Vein: No evidence of thrombus. Normal compressibility and flow on color Doppler imaging. Femoral Vein: Evidence of nonocclusive thrombus with abnormal compressibility, respiratory phasicity and response to augmentation. Popliteal Vein: No evidence of thrombus. Normal compressibility, respiratory phasicity and response to augmentation. Calf Veins: The LEFT posterior tibial vein and LEFT peroneal vein are limited in visualization. Superficial Great Saphenous Vein: No evidence of thrombus. Normal compressibility. Venous Reflux:  None. Other Findings:  None. IMPRESSION: 1. Limited evaluation of the BILATERAL posterior tibial veins and BILATERAL peroneal veins. 2. Evidence of nonocclusive thrombus within the BILATERAL greater saphenous veins and BILATERAL  femoral veins. Electronically Signed: By: Suzen Dials M.D. On: 01/03/2024 13:03   CT Angio Chest PE W and/or Wo Contrast Result Date: 01/03/2024 CLINICAL DATA:  Shortness of breath. EXAM: CT ANGIOGRAPHY CHEST WITH CONTRAST TECHNIQUE: Multidetector CT imaging of the chest was performed using the standard protocol during bolus administration of intravenous contrast. Multiplanar CT image reconstructions and MIPs were obtained to evaluate the vascular anatomy. RADIATION DOSE REDUCTION: This exam was performed according to the departmental dose-optimization program which includes automated exposure control, adjustment of the mA and/or kV according to patient size and/or use of iterative reconstruction technique. CONTRAST:  50mL OMNIPAQUE  IOHEXOL  350 MG/ML SOLN COMPARISON:  February 15, 2023 FINDINGS: Cardiovascular: There is marked severity calcification of the thoracic aorta with stable 3.8 cm diameter dilatation of the descending thoracic aorta. Satisfactory opacification of the pulmonary arteries to the segmental level. No evidence of acute pulmonary embolism. There is mild cardiomegaly with marked severity coronary artery calcification. No pericardial effusion. Mediastinum/Nodes: No enlarged mediastinal, hilar, or axillary lymph nodes. Subcentimeter partially calcified and noncalcified thyroid  nodules are seen within the right and left lobes of the thyroid  gland. The trachea and  esophagus demonstrate no significant findings. Lungs/Pleura: Predominantly centrilobular emphysematous lung disease is again seen with mild scattered areas of scarring and/or atelectasis noted bilaterally. This is slightly more prominent along the periphery of the bilateral lower lobes and posterior medial aspect of the left lung base. No acute infiltrate, pleural effusion or pneumothorax is identified. Upper Abdomen: No acute abnormality. Musculoskeletal: Multilevel degenerative changes are seen throughout the thoracic spine. Review  of the MIP images confirms the above findings. IMPRESSION: 1. No evidence of acute pulmonary embolism. 2. Stable 3.8 cm diameter dilatation of the descending thoracic aorta. 3. Mild cardiomegaly with marked severity coronary artery calcification. 4. Predominantly centrilobular emphysematous lung disease with mild scattered areas of scarring and/or atelectasis. 5. Subcentimeter partially calcified and noncalcified thyroid  nodules. 6. Aortic atherosclerosis. Electronically Signed   By: Suzen Dials M.D.   On: 01/03/2024 12:55    PROCEDURES:  Critical Care performed: No  Procedures   MEDICATIONS ORDERED IN ED: Medications  apixaban  (ELIQUIS ) tablet 10 mg (10 mg Oral Given 01/03/24 1337)  iohexol  (OMNIPAQUE ) 350 MG/ML injection 50 mL (50 mLs Intravenous Contrast Given 01/03/24 1218)     IMPRESSION / MDM / ASSESSMENT AND PLAN / ED COURSE  I reviewed the triage vital signs and the nursing notes.  Differential diagnosis includes, but is not limited to, DVT, PE, cellulitis, dependent edema  Patient's presentation is most consistent with acute presentation with potential threat to life or bodily function.  89 year old female presenting with a few days of worsening leg pain and swelling with outpatient ultrasound concerning for DVT.  Unable to view images from recent ultrasound, will obtain repeat here to further evaluate.  With shortness of breath, will also obtain CTA of the chest.  Ultrasound resulted with bilateral nonocclusive thrombus in the femoral and greater saphenous veins.  While nonocclusive, with proximal location will discuss with vascular surgery.  Clinical Course as of 01/03/24 1355  Fri Jan 03, 2024  1323 Case discussed with Dr. Marea.  He notes that with a nonocclusive thrombus, may not be acute, but with overall clinical presentation could be started on anticoagulation and follow-up as an outpatient. [NR]  1350 Patient and family reassessed and updated on results of workup.   They are comfortable with plan for discharge home, initiation of anticoagulation and follow-up with vascular surgery.  Prescription for Eliquis  sent to patient's preferred pharmacy.  Strict return precautions provided.  Patient discharged in stable condition. [NR]    Clinical Course User Index [NR] Levander Slate, MD     FINAL CLINICAL IMPRESSION(S) / ED DIAGNOSES   Final diagnoses:  Acute deep vein thrombosis (DVT) of femoral vein of both lower extremities (HCC)     Rx / DC Orders   ED Discharge Orders          Ordered    APIXABAN  (ELIQUIS ) VTE STARTER PACK (10MG  AND 5MG )       Note to Pharmacy: If starter pack unavailable, substitute with seventy-four 5 mg apixaban  tabs following the above SIG directions.   01/03/24 1355             Note:  This document was prepared using Dragon voice recognition software and may include unintentional dictation errors.   Levander Slate, MD 01/03/24 [phone]

## 2024-01-03 NOTE — ED Triage Notes (Addendum)
 Arrives from [place] via [facility].  C/O bilateral leg pain x 1 day.  Diagnosed yesterday with bilateral DVT.  Not on blood thinners. Legs had been wrapped at facility.  States noticed bruising to lower extremities.  Hx CHF, COPD.  VS wnl.  No SOB/ DOE.  DNR

## 2024-01-03 NOTE — Discharge Instructions (Signed)
 You were seen in the ER today for evaluation of your leg swelling.  Your ultrasound here did confirm clots in both of your legs.  We reviewed the case with her vascular surgeon.  Please take your anticoagulation medication as directed.  Follow-up with vascular surgery for further evaluation.  Return to the ER for new or worsening symptoms including blood in your stool or other uncontrolled bleeding, or any other new or concerning symptoms.

## 2024-01-03 NOTE — Telephone Encounter (Signed)

## 2024-01-26 DIAGNOSIS — I82409 Acute embolism and thrombosis of unspecified deep veins of unspecified lower extremity: Secondary | ICD-10-CM | POA: Insufficient documentation

## 2024-01-26 NOTE — Progress Notes (Deleted)
 MRN : [id number]  Roberta Ross is a 89 y.o. (1934-01-12) female who presents with chief complaint of legs hurt and swell.  History of Present Illness:   The patient presents to the office for evaluation of DVT.  DVT was identified at [facility] by Duplex ultrasound dated 01/03/2024.    IMPRESSION: 1. Limited evaluation of the BILATERAL posterior tibial veins and BILATERAL peroneal veins. 2. Evidence of nonocclusive thrombus within the BILATERAL greater saphenous veins and BILATERAL femoral veins.  She was started on Eliquis .  The initial symptoms were pain and swelling in the lower extremity.  The patient notes the affected leg is painful with dependency and swells.  Symptoms are much better with elevation.  The patient notes minimal edema in the morning which steadily worsens throughout the day.    The patient has not been using compression therapy at this point.  No SOB or pleuritic chest pains.  No cough or hemoptysis.  No blood per rectum or blood in any sputum.  No excessive bruising per the patient.   No recent shortening of the patient's walking distance or new symptoms consistent with claudication.  No history of rest pain symptoms. No new ulcers or wounds of the lower extremities have occurred.  The patient denies amaurosis fugax or recent TIA symptoms. There are no recent neurological changes noted. No recent episodes of angina or shortness of breath documented.   No outpatient medications have been marked as taking for the 01/27/24 encounter (Appointment) with Jama, Cordella MATSU, MD.    Past Medical History:  Diagnosis Date   Anemia    past hx   Arthritis    hands and feet   Chronic kidney disease    COPD (chronic obstructive pulmonary disease) (HCC)    Dyspnea    Dysrhythmia    Pt reports slow HR and papatations   Heart murmur    past hx   Hypercholesteremia    Hypertension    Rheumatic fever    (x2) in past   Wears dentures    full upper     Past Surgical History:  Procedure Laterality Date   ABDOMINAL HYSTERECTOMY     APPENDECTOMY     as a child   CATARACT EXTRACTION W/PHACO Left 01/10/2015   Procedure: CATARACT EXTRACTION PHACO AND INTRAOCULAR LENS PLACEMENT (IOC);  Surgeon: Donzell Arlyce Budd, MD;  Location: [facility];  Service: Ophthalmology;  Laterality: Left;   COMPLEX WOUND CLOSURE Left 11/15/2021   Procedure: COMPLEX WOUND CLOSURE;  Surgeon: Edda Mt, MD;  Location: [facility];  Service: ENT;  Laterality: Left;   EXCISION MASS HEAD Left 11/15/2021   Procedure: EXCISION MALIGNANT MASS EAR;  Surgeon: Edda Mt, MD;  Location: [facility];  Service: ENT;  Laterality: Left;   INTRAMEDULLARY (IM) NAIL INTERTROCHANTERIC Left 03/28/2020   Procedure: INTRAMEDULLARY (IM) NAIL INTERTROCHANTRIC-Hip Fracture;  Surgeon: Tobie Priest, MD;  Location: [facility];  Service: Orthopedics;  Laterality: Left;   TONSILLECTOMY      Social History Social History   Tobacco Use   Smoking status: Former    Current packs/day: 0.00    Average packs/day: 0.3 packs/day for 60.0 years (15.0 ttl pk-yrs)    Types: Cigarettes    Start date: 68    Quit date: 2020    Years since quitting: 5.7   Smokeless tobacco: Never  Vaping Use   Vaping status: Never Used  Substance Use Topics   Alcohol use: No   Drug use:  Not Currently    Family History Family History  Problem Relation Age of Onset   Cervical cancer Mother    Heart attack Father    Prostate cancer Father    Asthma Brother     Allergies  Allergen Reactions   Metoprolol Shortness Of Breath   Penicillins Other (See Comments)    Internal swelling including throat TOLERATES CEFTRIAXONE    Methotrexate Derivatives Nausea Only   Doxycycline Hyclate Rash     REVIEW OF SYSTEMS (Negative unless checked)  Constitutional: [] Weight loss  [] Fever  [] Chills Cardiac: [] Chest pain   [] Chest pressure   [] Palpitations   [] Shortness of breath when laying flat    [] Shortness of breath with exertion. Vascular:  [] Pain in legs with walking   [x] Pain in legs at rest  [] History of DVT   [] Phlebitis   [x] Swelling in legs   [] Varicose veins   [] Non-healing ulcers Pulmonary:   [] Uses home oxygen   [] Productive cough   [] Hemoptysis   [] Wheeze  [] COPD   [] Asthma Neurologic:  [] Dizziness   [] Seizures   [] History of stroke   [] History of TIA  [] Aphasia   [] Vissual changes   [] Weakness or numbness in arm   [] Weakness or numbness in leg Musculoskeletal:   [] Joint swelling   [] Joint pain   [] Low back pain Hematologic:  [] Easy bruising  [] Easy bleeding   [] Hypercoagulable state   [] Anemic Gastrointestinal:  [] Diarrhea   [] Vomiting  [] Gastroesophageal reflux/heartburn   [] Difficulty swallowing. Genitourinary:  [] Chronic kidney disease   [] Difficult urination  [] Frequent urination   [] Blood in urine Skin:  [] Rashes   [] Ulcers  Psychological:  [] History of anxiety   []  History of major depression.  Physical Examination  There were no vitals filed for this visit. There is no height or weight on file to calculate BMI. Gen: WD/WN, NAD Head: [place]/AT, No temporalis wasting.  Ear/Nose/Throat: Hearing grossly intact, nares w/o erythema or drainage, pinna without lesions Eyes: PER, EOMI, sclera nonicteric.  Neck: Supple, no gross masses.  No JVD.  Pulmonary:  Good air movement, no audible wheezing, no use of accessory muscles.  Cardiac: RRR, precordium not hyperdynamic. Vascular:  scattered varicosities present bilaterally.  Moderate venous stasis changes to the legs bilaterally.  2+ soft pitting edema. CEAP [id number]   Vessel Right Left  Radial Palpable Palpable  Gastrointestinal: soft, non-distended. No guarding/no peritoneal signs.  Musculoskeletal: M/S 5/5 throughout.  No deformity.  Neurologic: CN 2-12 intact. Pain and light touch intact in extremities.  Symmetrical.  Speech is fluent. Motor exam as listed above. Psychiatric: Judgment intact, Mood & affect appropriate  for pt's clinical situation. Dermatologic: Venous rashes no ulcers noted.  No changes consistent with cellulitis. Lymph : No lichenification or skin changes of chronic lymphedema.  CBC Lab Results  Component Value Date   WBC 5.9 01/03/2024   HGB 13.1 01/03/2024   HCT 40.5 01/03/2024   MCV 99.8 01/03/2024   PLT 184 01/03/2024    BMET    Component Value Date/Time   NA 142 01/03/2024 0953   K 4.4 01/03/2024 0953   CL 105 01/03/2024 0953   CO2 27 01/03/2024 0953   GLUCOSE 118 (H) 01/03/2024 0953   BUN 28 (H) 01/03/2024 0953   CREATININE 1.64 (H) 01/03/2024 0953   CALCIUM 10.2 01/03/2024 0953   GFRNONAA 30 (L) 01/03/2024 0953   CrCl cannot be calculated (Patient's most recent lab result is older than the maximum 21 days allowed.).  COAG Lab Results  Component Value Date  INR 1.1 01/03/2024   INR 1.2 01/21/2022   INR 1.1 03/27/2020    Radiology US  Venous Img Lower Bilateral Addendum Date: 01/03/2024 ADDENDUM REPORT: 01/03/2024 13:11 ADDENDUM: Results were discussed with Dr. Nilsa Dade at 1:07 p.m. [place] on January 03, 2024. Electronically Signed   By: Suzen Dials M.D.   On: 01/03/2024 13:11   Result Date: 01/03/2024 CLINICAL DATA:  Lower leg pain. EXAM: BILATERAL LOWER EXTREMITY VENOUS DOPPLER ULTRASOUND TECHNIQUE: Gray-scale sonography with graded compression, as well as color Doppler and duplex ultrasound were performed to evaluate the lower extremity deep venous systems from the level of the common femoral vein and including the common femoral, femoral, profunda femoral, popliteal and calf veins including the posterior tibial, peroneal and gastrocnemius veins when visible. The superficial great saphenous vein was also interrogated. Spectral Doppler was utilized to evaluate flow at rest and with distal augmentation maneuvers in the common femoral, femoral and popliteal veins. COMPARISON:  None Available. FINDINGS: RIGHT LOWER EXTREMITY Common Femoral Vein: No evidence of  thrombus. Normal compressibility, respiratory phasicity and response to augmentation. Saphenofemoral Junction: Evidence of nonocclusive thrombus with abnormal compressibility and flow on color Doppler imaging. Profunda Femoral Vein: No evidence of thrombus. Normal compressibility and flow on color Doppler imaging. Femoral Vein: Evidence of nonocclusive thrombus with abnormal compressibility, respiratory phasicity and response to augmentation. Popliteal Vein: No evidence of thrombus. Normal compressibility, respiratory phasicity and response to augmentation. Calf Veins: The RIGHT posterior tibial vein and RIGHT peroneal vein are limited in visualization. Superficial Great Saphenous Vein: No evidence of thrombus. Normal compressibility. Venous Reflux:  None. Other Findings:  None. LEFT LOWER EXTREMITY Common Femoral Vein: No evidence of thrombus. Normal compressibility, respiratory phasicity and response to augmentation. Saphenofemoral Junction: Evidence of nonocclusive thrombus with abnormal compressibility and flow on color Doppler imaging. Profunda Femoral Vein: No evidence of thrombus. Normal compressibility and flow on color Doppler imaging. Femoral Vein: Evidence of nonocclusive thrombus with abnormal compressibility, respiratory phasicity and response to augmentation. Popliteal Vein: No evidence of thrombus. Normal compressibility, respiratory phasicity and response to augmentation. Calf Veins: The LEFT posterior tibial vein and LEFT peroneal vein are limited in visualization. Superficial Great Saphenous Vein: No evidence of thrombus. Normal compressibility. Venous Reflux:  None. Other Findings:  None. IMPRESSION: 1. Limited evaluation of the BILATERAL posterior tibial veins and BILATERAL peroneal veins. 2. Evidence of nonocclusive thrombus within the BILATERAL greater saphenous veins and BILATERAL femoral veins. Electronically Signed: By: Suzen Dials M.D. On: 01/03/2024 13:03   CT Angio Chest PE W  and/or Wo Contrast Result Date: 01/03/2024 CLINICAL DATA:  Shortness of breath. EXAM: CT ANGIOGRAPHY CHEST WITH CONTRAST TECHNIQUE: Multidetector CT imaging of the chest was performed using the standard protocol during bolus administration of intravenous contrast. Multiplanar CT image reconstructions and MIPs were obtained to evaluate the vascular anatomy. RADIATION DOSE REDUCTION: This exam was performed according to the departmental dose-optimization program which includes automated exposure control, adjustment of the mA and/or kV according to patient size and/or use of iterative reconstruction technique. CONTRAST:  50mL OMNIPAQUE  IOHEXOL  350 MG/ML SOLN COMPARISON:  February 15, 2023 FINDINGS: Cardiovascular: There is marked severity calcification of the thoracic aorta with stable 3.8 cm diameter dilatation of the descending thoracic aorta. Satisfactory opacification of the pulmonary arteries to the segmental level. No evidence of acute pulmonary embolism. There is mild cardiomegaly with marked severity coronary artery calcification. No pericardial effusion. Mediastinum/Nodes: No enlarged mediastinal, hilar, or axillary lymph nodes. Subcentimeter partially calcified and noncalcified thyroid  nodules are seen  within the right and left lobes of the thyroid  gland. The trachea and esophagus demonstrate no significant findings. Lungs/Pleura: Predominantly centrilobular emphysematous lung disease is again seen with mild scattered areas of scarring and/or atelectasis noted bilaterally. This is slightly more prominent along the periphery of the bilateral lower lobes and posterior medial aspect of the left lung base. No acute infiltrate, pleural effusion or pneumothorax is identified. Upper Abdomen: No acute abnormality. Musculoskeletal: Multilevel degenerative changes are seen throughout the thoracic spine. Review of the MIP images confirms the above findings. IMPRESSION: 1. No evidence of acute pulmonary embolism. 2.  Stable 3.8 cm diameter dilatation of the descending thoracic aorta. 3. Mild cardiomegaly with marked severity coronary artery calcification. 4. Predominantly centrilobular emphysematous lung disease with mild scattered areas of scarring and/or atelectasis. 5. Subcentimeter partially calcified and noncalcified thyroid  nodules. 6. Aortic atherosclerosis. Electronically Signed   By: Suzen Dials M.D.   On: 01/03/2024 12:55     Assessment/Plan There are no diagnoses linked to this encounter.   Cordella Shawl, MD  01/26/2024 4:23 PM

## 2024-01-27 ENCOUNTER — Encounter (INDEPENDENT_AMBULATORY_CARE_PROVIDER_SITE_OTHER): Admitting: Vascular Surgery

## 2024-01-27 DIAGNOSIS — I82413 Acute embolism and thrombosis of femoral vein, bilateral: Secondary | ICD-10-CM

## 2024-01-28 ENCOUNTER — Encounter (INDEPENDENT_AMBULATORY_CARE_PROVIDER_SITE_OTHER): Admitting: Vascular Surgery

## 2024-01-29 ENCOUNTER — Encounter (INDEPENDENT_AMBULATORY_CARE_PROVIDER_SITE_OTHER): Payer: Self-pay | Admitting: Vascular Surgery

## 2024-01-29 ENCOUNTER — Ambulatory Visit (INDEPENDENT_AMBULATORY_CARE_PROVIDER_SITE_OTHER): Admitting: Vascular Surgery

## 2024-01-29 VITALS — BP 116/80 | HR 81 | Resp 16

## 2024-01-29 DIAGNOSIS — E7800 Pure hypercholesterolemia, unspecified: Secondary | ICD-10-CM | POA: Diagnosis not present

## 2024-01-29 DIAGNOSIS — I82413 Acute embolism and thrombosis of femoral vein, bilateral: Secondary | ICD-10-CM | POA: Diagnosis not present

## 2024-01-29 DIAGNOSIS — I482 Chronic atrial fibrillation, unspecified: Secondary | ICD-10-CM

## 2024-01-29 DIAGNOSIS — I10 Essential (primary) hypertension: Secondary | ICD-10-CM | POA: Diagnosis not present

## 2024-01-29 NOTE — Progress Notes (Signed)
 Subjective:    Patient ID: Roberta Ross, female    DOB: 09-26-1933, 89 y.o.   MRN: [id number] Chief Complaint  Patient presents with   New Patient (Initial Visit)    [facility] ER consult    Roberta Ross is an 89 yo female who presents to clinic today for follow up post hospitalization with bilateral lower extremity non occlusive DVT's.  Patient was seen on 01/03/2024 in the emergency room for bilateral lower extremity swelling and pain.  She underwent bilateral lower extremity venous duplex ultrasounds which revealed bilateral lower extremity nonocclusive DVTs.  Patient was started on Eliquis  10 mg twice daily for 7 days then reduce to 5 mg twice daily and sent home from the emergency department.  She currently lives at [facility] assisted living.  She presents today stating that her legs are much less swollen than they were in the emergency room.  Although her legs still feel heavy and she still feels tired.  She is ambulating at [place] minimally.  She endorses taking her Eliquis  every day.    Review of Systems  Constitutional: Negative.   Cardiovascular:  Positive for leg swelling.       History of bilateral lower extremity nonocclusive DVTs.  Musculoskeletal:  Positive for myalgias.  All other systems reviewed and are negative.      Objective:   Physical Exam Vitals reviewed.  Constitutional:      Appearance: Normal appearance. She is normal weight.  HENT:     Head: Normocephalic.  Eyes:     Pupils: Pupils are equal, round, and reactive to light.  Cardiovascular:     Rate and Rhythm: Normal rate and regular rhythm.     Pulses: Normal pulses.     Heart sounds: Normal heart sounds.  Pulmonary:     Effort: Pulmonary effort is normal.     Breath sounds: Normal breath sounds.  Abdominal:     General: Abdomen is flat. Bowel sounds are normal.     Palpations: Abdomen is soft.  Musculoskeletal:        General: Swelling and tenderness present.     Right lower leg: Edema  present.     Left lower leg: Edema present.     Comments: History of bilateral lower extremity DVTs that are nonocclusive.  Patient noted to have bilateral lower extremity +1 edema.  No open sores or wounds noted.  Bilateral lower extremities are tender to touch.  Skin:    General: Skin is warm and dry.     Capillary Refill: Capillary refill takes 2 to 3 seconds.  Neurological:     General: No focal deficit present.     Mental Status: She is alert and oriented to person, place, and time. Mental status is at baseline.  Psychiatric:        Mood and Affect: Mood normal.        Behavior: Behavior normal.        Thought Content: Thought content normal.        Judgment: Judgment normal.     BP 116/80   Pulse 81   Resp 16   Past Medical History:  Diagnosis Date   Anemia    past hx   Arthritis    hands and feet   Chronic kidney disease    COPD (chronic obstructive pulmonary disease) (HCC)    Dyspnea    Dysrhythmia    Pt reports slow HR and papatations   Heart murmur    past hx  Hypercholesteremia    Hypertension    Rheumatic fever    (x2) in past   Wears dentures    full upper    Social History   Socioeconomic History   Marital status: Single    Spouse name: Not on file   Number of children: Not on file   Years of education: Not on file   Highest education level: Not on file  Occupational History   Not on file  Tobacco Use   Smoking status: Former    Current packs/day: 0.00    Average packs/day: 0.3 packs/day for 60.0 years (15.0 ttl pk-yrs)    Types: Cigarettes    Start date: 68    Quit date: 2020    Years since quitting: 5.7   Smokeless tobacco: Never  Vaping Use   Vaping status: Never Used  Substance and Sexual Activity   Alcohol use: No   Drug use: Not Currently   Sexual activity: Not on file  Other Topics Concern   Not on file  Social History Narrative   Not on file   Social Drivers of Health   Financial Resource Strain: Not on file  Food  Insecurity: No Food Insecurity (02/15/2023)   Hunger Vital Sign    Worried About Running Out of Food in the Last Year: Never true    Ran Out of Food in the Last Year: Never true  Transportation Needs: No Transportation Needs (02/15/2023)   PRAPARE - Administrator, Civil Service (Medical): No    Lack of Transportation (Non-Medical): No  Physical Activity: Not on file  Stress: Not on file  Social Connections: Not on file  Intimate Partner Violence: Not At Risk (02/15/2023)   Humiliation, Afraid, Rape, and Kick questionnaire    Fear of Current or Ex-Partner: No    Emotionally Abused: No    Physically Abused: No    Sexually Abused: No    Past Surgical History:  Procedure Laterality Date   ABDOMINAL HYSTERECTOMY     APPENDECTOMY     as a child   CATARACT EXTRACTION W/PHACO Left 01/10/2015   Procedure: CATARACT EXTRACTION PHACO AND INTRAOCULAR LENS PLACEMENT (IOC);  Surgeon: Donzell Arlyce Budd, MD;  Location: [facility];  Service: Ophthalmology;  Laterality: Left;   COMPLEX WOUND CLOSURE Left 11/15/2021   Procedure: COMPLEX WOUND CLOSURE;  Surgeon: Edda Mt, MD;  Location: [facility];  Service: ENT;  Laterality: Left;   EXCISION MASS HEAD Left 11/15/2021   Procedure: EXCISION MALIGNANT MASS EAR;  Surgeon: Edda Mt, MD;  Location: [facility];  Service: ENT;  Laterality: Left;   INTRAMEDULLARY (IM) NAIL INTERTROCHANTERIC Left 03/28/2020   Procedure: INTRAMEDULLARY (IM) NAIL INTERTROCHANTRIC-Hip Fracture;  Surgeon: Tobie Priest, MD;  Location: [facility];  Service: Orthopedics;  Laterality: Left;   TONSILLECTOMY      Family History  Problem Relation Age of Onset   Cervical cancer Mother    Heart attack Father    Prostate cancer Father    Asthma Brother     Allergies  Allergen Reactions   Metoprolol Shortness Of Breath   Penicillins Other (See Comments)    Internal swelling including throat TOLERATES CEFTRIAXONE    Methotrexate Derivatives Nausea  Only   Doxycycline Hyclate Rash       Latest Ref Rng & Units 01/03/2024    9:53 AM 10/12/2023    2:09 PM 08/01/2023   12:00 PM  CBC  WBC 4.0 - 10.5 K/uL 5.9  5.8  7.3   Hemoglobin  12.0 - 15.0 g/dL 86.8  86.7  87.5   Hematocrit 36.0 - 46.0 % 40.5  40.5  38.4   Platelets 150 - 400 K/uL 184  168  163       CMP     Component Value Date/Time   NA 142 01/03/2024 0953   K 4.4 01/03/2024 0953   CL 105 01/03/2024 0953   CO2 27 01/03/2024 0953   GLUCOSE 118 (H) 01/03/2024 0953   BUN 28 (H) 01/03/2024 0953   CREATININE 1.64 (H) 01/03/2024 0953   CALCIUM 10.2 01/03/2024 0953   PROT 7.0 01/03/2024 0953   ALBUMIN 3.8 01/03/2024 0953   AST 14 (L) 01/03/2024 0953   ALT 7 01/03/2024 0953   ALKPHOS 75 01/03/2024 0953   BILITOT 0.7 01/03/2024 0953   GFRNONAA 30 (L) 01/03/2024 0953     No results found.     Assessment & Plan:   1. Deep vein thrombosis (DVT) of femoral vein of both lower extremities, unspecified chronicity (HCC) (Primary) The patient presents to the office for evaluation of DVT.  DVT was identified at [facility] by Duplex ultrasound dated 01/03/2024.    The initial symptoms were pain and swelling in the lower extremity.  The patient notes her affected legs are painful with dependency and swells.  Symptoms are much better with elevation.  The patient notes minimal edema in the morning which steadily worsens throughout the day.    The patient has not been using compression therapy at this point.  No SOB or pleuritic chest pains.  No cough or hemoptysis.  No blood per rectum or blood in any sputum.  No excessive bruising per the patient.   No recent shortening of the patient's walking distance or new symptoms consistent with claudication.  No history of rest pain symptoms. No new ulcers or wounds of the lower extremities have occurred.  The patient denies amaurosis fugax or recent TIA symptoms. There are no recent neurological changes noted. No recent episodes of angina or  shortness of breath documented.   Patient was started on Eliquis  10 mg twice daily for 7 days then decrease to Eliquis  5 mg twice daily indefinitely.  She is compliant with taking this medication.  Patient will follow-up with us  in 3 months with repeat venous Doppler ultrasounds to assess her DVTs.  Patient's paperwork from [place] was filled out with her return visit today.  2. Atrial fibrillation, chronic (HCC) Continue antiarrhythmia medications as already ordered, these medications have been reviewed and there are no changes at this time.  Continue anticoagulation as ordered by Cardiology Service  3. Essential hypertension Continue antihypertensive medications as already ordered, these medications have been reviewed and there are no changes at this time.  4. Pure hypercholesterolemia Continue statin as ordered and reviewed, no changes at this time   Current Outpatient Medications on File Prior to Visit  Medication Sig Dispense Refill   acetaminophen  (TYLENOL ) 650 MG CR tablet Take 650 mg by mouth 2 (two) times daily.     albuterol  (VENTOLIN  HFA) 108 (90 Base) MCG/ACT inhaler Inhale 2 puffs into the lungs every 4 (four) hours as needed for wheezing or shortness of breath. 18 g 0   APIXABAN  (ELIQUIS ) VTE STARTER PACK (10MG  AND 5MG ) Take as directed on package: start with two-5mg  tablets twice daily for 7 days. On day 8, switch to one-5mg  tablet twice daily. 74 each 0   cefUROXime  (CEFTIN ) 250 MG tablet Take 1 tablet (250 mg total) by mouth 2 (two)  times daily with a meal. 14 tablet 0   diltiazem  (CARDIZEM  LA) 240 MG 24 hr tablet Take 240 mg by mouth daily.     potassium chloride  (KLOR-CON ) 10 MEQ tablet Take 10 mEq by mouth daily.     sennosides-docusate sodium  (SENOKOT-S) 8.6-50 MG tablet Take 2 tablets by mouth 2 (two) times daily.     simvastatin  (ZOCOR ) 10 MG tablet Take 1 tablet (10 mg total) by mouth every evening. 30 tablet 11   SPIRIVA HANDIHALER 18 MCG inhalation capsule Place  18 mcg into inhaler and inhale daily.     torsemide  (DEMADEX ) 20 MG tablet Take 1 tablet (20 mg total) by mouth every other day. 30 tablet 0   traMADol  (ULTRAM ) 50 MG tablet Take 1 tablet (50 mg total) by mouth every 6 (six) hours as needed for moderate pain. 8 tablet 0   No current facility-administered medications on file prior to visit.    There are no Patient Instructions on file for this visit. No follow-ups on file.   Gwendlyn JONELLE Shank, NP

## 2024-02-05 ENCOUNTER — Ambulatory Visit: Admitting: Podiatry

## 2024-02-07 ENCOUNTER — Emergency Department

## 2024-02-07 ENCOUNTER — Inpatient Hospital Stay
Admission: EM | Admit: 2024-02-07 | Discharge: 2024-02-13 | DRG: 299 | Disposition: A | Discharge status: Skilled Nursing Facility | Source: Skilled Nursing Facility | Attending: Osteopathic Medicine | Admitting: Osteopathic Medicine

## 2024-02-07 ENCOUNTER — Other Ambulatory Visit: Payer: Self-pay

## 2024-02-07 DIAGNOSIS — E663 Overweight: Secondary | ICD-10-CM | POA: Diagnosis present

## 2024-02-07 DIAGNOSIS — G9341 Metabolic encephalopathy: Secondary | ICD-10-CM | POA: Diagnosis not present

## 2024-02-07 DIAGNOSIS — Z7952 Long term (current) use of systemic steroids: Secondary | ICD-10-CM | POA: Diagnosis not present

## 2024-02-07 DIAGNOSIS — L89312 Pressure ulcer of right buttock, stage 2: Secondary | ICD-10-CM | POA: Diagnosis present

## 2024-02-07 DIAGNOSIS — I130 Hypertensive heart and chronic kidney disease with heart failure and stage 1 through stage 4 chronic kidney disease, or unspecified chronic kidney disease: Secondary | ICD-10-CM | POA: Diagnosis present

## 2024-02-07 DIAGNOSIS — Z888 Allergy status to other drugs, medicaments and biological substances status: Secondary | ICD-10-CM

## 2024-02-07 DIAGNOSIS — Z7901 Long term (current) use of anticoagulants: Secondary | ICD-10-CM | POA: Diagnosis not present

## 2024-02-07 DIAGNOSIS — M069 Rheumatoid arthritis, unspecified: Secondary | ICD-10-CM | POA: Diagnosis present

## 2024-02-07 DIAGNOSIS — I10 Essential (primary) hypertension: Secondary | ICD-10-CM | POA: Diagnosis present

## 2024-02-07 DIAGNOSIS — I82409 Acute embolism and thrombosis of unspecified deep veins of unspecified lower extremity: Secondary | ICD-10-CM | POA: Diagnosis present

## 2024-02-07 DIAGNOSIS — Z961 Presence of intraocular lens: Secondary | ICD-10-CM | POA: Diagnosis present

## 2024-02-07 DIAGNOSIS — N179 Acute kidney failure, unspecified: Secondary | ICD-10-CM | POA: Diagnosis present

## 2024-02-07 DIAGNOSIS — F411 Generalized anxiety disorder: Secondary | ICD-10-CM | POA: Diagnosis present

## 2024-02-07 DIAGNOSIS — N189 Chronic kidney disease, unspecified: Secondary | ICD-10-CM

## 2024-02-07 DIAGNOSIS — N1832 Chronic kidney disease, stage 3b: Secondary | ICD-10-CM | POA: Diagnosis present

## 2024-02-07 DIAGNOSIS — E7800 Pure hypercholesterolemia, unspecified: Secondary | ICD-10-CM | POA: Diagnosis present

## 2024-02-07 DIAGNOSIS — M10071 Idiopathic gout, right ankle and foot: Secondary | ICD-10-CM | POA: Diagnosis not present

## 2024-02-07 DIAGNOSIS — I82411 Acute embolism and thrombosis of right femoral vein: Principal | ICD-10-CM | POA: Diagnosis present

## 2024-02-07 DIAGNOSIS — Z66 Do not resuscitate: Secondary | ICD-10-CM | POA: Diagnosis present

## 2024-02-07 DIAGNOSIS — Z86718 Personal history of other venous thrombosis and embolism: Secondary | ICD-10-CM | POA: Diagnosis not present

## 2024-02-07 DIAGNOSIS — Z90710 Acquired absence of both cervix and uterus: Secondary | ICD-10-CM

## 2024-02-07 DIAGNOSIS — Z8249 Family history of ischemic heart disease and other diseases of the circulatory system: Secondary | ICD-10-CM

## 2024-02-07 DIAGNOSIS — I824Y1 Acute embolism and thrombosis of unspecified deep veins of right proximal lower extremity: Principal | ICD-10-CM

## 2024-02-07 DIAGNOSIS — I82401 Acute embolism and thrombosis of unspecified deep veins of right lower extremity: Secondary | ICD-10-CM | POA: Diagnosis not present

## 2024-02-07 DIAGNOSIS — Z881 Allergy status to other antibiotic agents status: Secondary | ICD-10-CM | POA: Diagnosis not present

## 2024-02-07 DIAGNOSIS — M899 Disorder of bone, unspecified: Secondary | ICD-10-CM | POA: Diagnosis present

## 2024-02-07 DIAGNOSIS — Z8616 Personal history of COVID-19: Secondary | ICD-10-CM | POA: Diagnosis not present

## 2024-02-07 DIAGNOSIS — I482 Chronic atrial fibrillation, unspecified: Secondary | ICD-10-CM | POA: Diagnosis present

## 2024-02-07 DIAGNOSIS — Z79631 Long term (current) use of antimetabolite agent: Secondary | ICD-10-CM

## 2024-02-07 DIAGNOSIS — I82431 Acute embolism and thrombosis of right popliteal vein: Secondary | ICD-10-CM | POA: Diagnosis present

## 2024-02-07 DIAGNOSIS — Z6826 Body mass index (BMI) 26.0-26.9, adult: Secondary | ICD-10-CM

## 2024-02-07 DIAGNOSIS — Z79899 Other long term (current) drug therapy: Secondary | ICD-10-CM | POA: Diagnosis not present

## 2024-02-07 DIAGNOSIS — L8990 Pressure ulcer of unspecified site, unspecified stage: Secondary | ICD-10-CM | POA: Insufficient documentation

## 2024-02-07 DIAGNOSIS — M109 Gout, unspecified: Secondary | ICD-10-CM | POA: Diagnosis present

## 2024-02-07 DIAGNOSIS — J449 Chronic obstructive pulmonary disease, unspecified: Secondary | ICD-10-CM | POA: Diagnosis present

## 2024-02-07 DIAGNOSIS — Z87891 Personal history of nicotine dependence: Secondary | ICD-10-CM

## 2024-02-07 DIAGNOSIS — Z880 Allergy status to penicillin: Secondary | ICD-10-CM

## 2024-02-07 DIAGNOSIS — M05771 Rheumatoid arthritis with rheumatoid factor of right ankle and foot without organ or systems involvement: Secondary | ICD-10-CM | POA: Diagnosis not present

## 2024-02-07 DIAGNOSIS — E785 Hyperlipidemia, unspecified: Secondary | ICD-10-CM | POA: Diagnosis not present

## 2024-02-07 DIAGNOSIS — Z9049 Acquired absence of other specified parts of digestive tract: Secondary | ICD-10-CM

## 2024-02-07 DIAGNOSIS — Z9842 Cataract extraction status, left eye: Secondary | ICD-10-CM

## 2024-02-07 DIAGNOSIS — R791 Abnormal coagulation profile: Secondary | ICD-10-CM | POA: Diagnosis not present

## 2024-02-07 LAB — CBC WITH DIFFERENTIAL/PLATELET
Abs Immature Granulocytes: 0.03 K/uL (ref 0.00–0.07)
Basophils Absolute: 0 K/uL (ref 0.0–0.1)
Basophils Relative: 1 %
Eosinophils Absolute: 0.2 K/uL (ref 0.0–0.5)
Eosinophils Relative: 3 %
HCT: 37.2 % (ref 36.0–46.0)
Hemoglobin: 12 g/dL (ref 12.0–15.0)
Immature Granulocytes: 0 %
Lymphocytes Relative: 18 %
Lymphs Abs: 1.4 K/uL (ref 0.7–4.0)
MCH: 32.3 pg (ref 26.0–34.0)
MCHC: 32.3 g/dL (ref 30.0–36.0)
MCV: 100 fL (ref 80.0–100.0)
Monocytes Absolute: 0.5 K/uL (ref 0.1–1.0)
Monocytes Relative: 6 %
Neutro Abs: 5.5 K/uL (ref 1.7–7.7)
Neutrophils Relative %: 72 %
Platelets: 242 K/uL (ref 150–400)
RBC: 3.72 MIL/uL — ABNORMAL LOW (ref 3.87–5.11)
RDW: 13.2 % (ref 11.5–15.5)
WBC: 7.7 K/uL (ref 4.0–10.5)
nRBC: 0 % (ref 0.0–0.2)

## 2024-02-07 LAB — COMPREHENSIVE METABOLIC PANEL WITH GFR
ALT: 8 U/L (ref 0–44)
AST: 12 U/L — ABNORMAL LOW (ref 15–41)
Albumin: 3.8 g/dL (ref 3.5–5.0)
Alkaline Phosphatase: 88 U/L (ref 38–126)
Anion gap: 11 (ref 5–15)
BUN: 25 mg/dL — ABNORMAL HIGH (ref 8–23)
CO2: 26 mmol/L (ref 22–32)
Calcium: 10 mg/dL (ref 8.9–10.3)
Chloride: 104 mmol/L (ref 98–111)
Creatinine, Ser: 1.71 mg/dL — ABNORMAL HIGH (ref 0.44–1.00)
GFR, Estimated: 29 mL/min — ABNORMAL LOW (ref >60)
Glucose, Bld: 127 mg/dL — ABNORMAL HIGH (ref 70–99)
Potassium: 4.3 mmol/L (ref 3.5–5.1)
Sodium: 141 mmol/L (ref 135–145)
Total Bilirubin: 0.8 mg/dL (ref 0.0–1.2)
Total Protein: 7.3 g/dL (ref 6.5–8.1)

## 2024-02-07 LAB — HEPARIN LEVEL (UNFRACTIONATED)
Heparin Unfractionated: >1.1 [IU]/mL — ABNORMAL HIGH (ref 0.30–0.70)
Heparin Unfractionated: >1.1 [IU]/mL — ABNORMAL HIGH (ref 0.30–0.70)

## 2024-02-07 LAB — APTT
aPTT: >200 s (ref 24–36)
aPTT: 185 s (ref 24–36)

## 2024-02-07 LAB — PROTIME-INR
INR: 1.7 — ABNORMAL HIGH (ref 0.8–1.2)
Prothrombin Time: 21.3 s — ABNORMAL HIGH (ref 11.4–15.2)

## 2024-02-07 MED ORDER — DILTIAZEM HCL ER 240 MG PO TB24
240.0000 mg | ORAL_TABLET | Freq: Every day | ORAL | Status: DC
  Administered 2024-02-07 – 2024-02-13 (×7): 240 mg via ORAL
  Filled 2024-02-07 (×12): qty 1

## 2024-02-07 MED ORDER — ONDANSETRON HCL 4 MG/2ML IJ SOLN: 4.0000 mg | Freq: Four times a day (QID) | INTRAMUSCULAR | Status: DC | PRN

## 2024-02-07 MED ORDER — HEPARIN (PORCINE) 25000 UT/250ML-% IV SOLN
1100.0000 [IU]/h | INTRAVENOUS | Status: DC
  Administered 2024-02-07 – 2024-02-08 (×2): 950 [IU]/h via INTRAVENOUS
  Administered 2024-02-09 – 2024-02-13 (×5): 1100 [IU]/h via INTRAVENOUS
  Filled 2024-02-07 (×6): qty 250

## 2024-02-07 MED ORDER — DICLOFENAC SODIUM 1 % EX GEL
4.0000 g | Freq: Four times a day (QID) | CUTANEOUS | Status: DC
  Administered 2024-02-07 – 2024-02-13 (×14): 4 g via TOPICAL
  Filled 2024-02-07 (×3): qty 100

## 2024-02-07 MED ORDER — PREDNISONE 10 MG PO TABS
5.0000 mg | ORAL_TABLET | Freq: Every day | ORAL | Status: DC
  Administered 2024-02-07 – 2024-02-08 (×2): 5 mg via ORAL
  Filled 2024-02-07 (×2): qty 1

## 2024-02-07 MED ORDER — SERTRALINE HCL 50 MG PO TABS
50.0000 mg | ORAL_TABLET | Freq: Every day | ORAL | Status: DC
  Administered 2024-02-07 – 2024-02-13 (×7): 50 mg via ORAL
  Filled 2024-02-07 (×7): qty 1

## 2024-02-07 MED ORDER — PREDNISOLONE 5 MG PO TABS: 5.0000 mg | ORAL_TABLET | Freq: Every day | ORAL | Status: DC

## 2024-02-07 MED ORDER — SENNOSIDES-DOCUSATE SODIUM 8.6-50 MG PO TABS
2.0000 | ORAL_TABLET | Freq: Two times a day (BID) | ORAL | Status: DC
  Administered 2024-02-08 – 2024-02-13 (×8): 2 via ORAL
  Filled 2024-02-07 (×8): qty 2

## 2024-02-07 MED ORDER — POLYETHYLENE GLYCOL 3350 17 G PO PACK: 17.0000 g | PACK | Freq: Every day | ORAL | Status: DC | PRN

## 2024-02-07 MED ORDER — MORPHINE SULFATE (PF) 2 MG/ML IV SOLN: 2.0000 mg | INTRAVENOUS | Status: DC | PRN

## 2024-02-07 MED ORDER — TORSEMIDE 20 MG PO TABS
20.0000 mg | ORAL_TABLET | ORAL | Status: DC
  Administered 2024-02-08 – 2024-02-12 (×3): 20 mg via ORAL
  Filled 2024-02-07 (×3): qty 1

## 2024-02-07 MED ORDER — HEPARIN BOLUS VIA INFUSION
5000.0000 [IU] | Freq: Once | INTRAVENOUS | Status: AC
  Administered 2024-02-07: 5000 [IU] via INTRAVENOUS
  Filled 2024-02-07: qty 5000

## 2024-02-07 MED ORDER — ONDANSETRON HCL 4 MG PO TABS: 4.0000 mg | ORAL_TABLET | Freq: Four times a day (QID) | ORAL | Status: DC | PRN

## 2024-02-07 MED ORDER — ALBUTEROL SULFATE (2.5 MG/3ML) 0.083% IN NEBU
3.0000 mL | INHALATION_SOLUTION | RESPIRATORY_TRACT | Status: DC | PRN
  Administered 2024-02-10: 3 mL via RESPIRATORY_TRACT
  Filled 2024-02-07: qty 3

## 2024-02-07 MED ORDER — HYDROCODONE-ACETAMINOPHEN 5-325 MG PO TABS
1.0000 | ORAL_TABLET | Freq: Once | ORAL | Status: AC
  Administered 2024-02-07: 1 via ORAL
  Filled 2024-02-07: qty 1

## 2024-02-07 MED ORDER — OXYCODONE HCL 5 MG PO TABS
5.0000 mg | ORAL_TABLET | ORAL | Status: DC | PRN
  Administered 2024-02-10 – 2024-02-11 (×2): 5 mg via ORAL
  Filled 2024-02-07 (×2): qty 1

## 2024-02-07 MED ORDER — HEPARIN (PORCINE) 25000 UT/250ML-% IV SOLN
1200.0000 [IU]/h | INTRAVENOUS | Status: DC
  Administered 2024-02-07: 1200 [IU]/h via INTRAVENOUS
  Filled 2024-02-07: qty 250

## 2024-02-07 MED ORDER — FOLIC ACID 1 MG PO TABS
500.0000 ug | ORAL_TABLET | Freq: Every morning | ORAL | Status: DC
  Administered 2024-02-08 – 2024-02-13 (×6): 0.5 mg via ORAL
  Filled 2024-02-07 (×6): qty 1

## 2024-02-07 MED ORDER — SIMVASTATIN 20 MG PO TABS
10.0000 mg | ORAL_TABLET | Freq: Every evening | ORAL | Status: DC
  Administered 2024-02-07 – 2024-02-12 (×6): 10 mg via ORAL
  Filled 2024-02-07 (×6): qty 1

## 2024-02-07 NOTE — ED Notes (Addendum)
 Notified pharm at this time for APTT >200.

## 2024-02-07 NOTE — ED Notes (Signed)
 Radiology called this RN for pt results from ultrasound. Radiology needing to report a critical result. Dr. Levander not at desk. Radiologist given direct number to MD for results.

## 2024-02-07 NOTE — ED Notes (Signed)
 Pt brief changed at this time. Pt voided. Brief, and chux pad replaced. Pt able to roll with x1 assist.

## 2024-02-07 NOTE — Consult Note (Signed)
 Pharmacy Consult Note - Anticoagulation  Pharmacy Consult for heparin  Indication: DVT  PATIENT MEASUREMENTS: Height: 5' 5 (165.1 cm) Weight: 72.7 kg (160 lb 4.4 oz) IBW/kg (Calculated) : 57 HEPARIN  DW (KG): 71.7  VITAL SIGNS: Temp: 97.9 F (36.6 C) (10/10 1901) Temp Source: Oral (10/10 1442) BP: 117/76 (10/10 1901) Pulse Rate: 96 (10/10 1901)  Recent Labs    02/07/24 0948 02/07/24 1536 02/07/24 1753 02/07/24 1923  HGB 12.0  --   --   --   HCT 37.2  --   --   --   PLT 242  --   --   --   APTT  --    < >  --  185*  LABPROT 21.3*  --   --   --   INR 1.7*  --   --   --   HEPARINUNFRC  --    < > >1.10*  --   CREATININE 1.71*  --   --   --    < > = values in this interval not displayed.    Estimated Creatinine Clearance: 23.2 mL/min (A) (by C-G formula based on SCr of 1.71 mg/dL (H)).  PAST MEDICAL HISTORY: Past Medical History:  Diagnosis Date   Anemia    past hx   Arthritis    hands and feet   Chronic kidney disease    COPD (chronic obstructive pulmonary disease) (HCC)    Dyspnea    Dysrhythmia    Pt reports slow HR and papatations   Heart murmur    past hx   Hypercholesteremia    Hypertension    Rheumatic fever    (x2) in past   Wears dentures    full upper    ASSESSMENT: 89 y.o. female with PMH including recent DVT diagnosis is presenting with worsening DVT. She had been prescribed Eliquis  for DVT diagnosed 01/03/2024 but per discussions between RN and EDP, her facility had not been giving it regularly -- last dose unknown. Pharmacy has been consulted to initiate and manage heparin  intravenous infusion.  Pertinent medications: Has not been taking Eliquis  as prescribed  Goal(s) of therapy: Heparin  level 0.3 - 0.7 units/mL Monitor platelets by anticoagulation protocol: Yes   Baseline anticoagulation labs: Recent Labs    02/07/24 0948 02/07/24 1536 02/07/24 1923  APTT  --  >200* 185*  INR 1.7*  --   --   HGB 12.0  --   --   PLT 242  --   --    Baseline heparin   ordered Will order baseline anti-Xa level to assess for false elevation.   PLAN: 10/10:  Heparin  gtt and bolus given @ 1524 10/10: HL @ 1536 = > 1.10 (not true baseline b/c drawn after heparin  started) 10/10: HL @ 1753 = > 1.10 10/10: aPTT @ 1923 = 185, elevated   - unclear if elevated aPTT, HL is due to Eliquis  PTA or from heparin  - will hold heparin  gtt for 1 hr and restart @ 950 units/hr - will use aPTT to guide dosing until correlating with HL - CBC daily  Monitor CBC daily while on heparin  infusion.  Oddie Kuhlmann D Clinical Pharmacist 02/07/2024 9:57 PM

## 2024-02-07 NOTE — Consult Note (Signed)
 Hospital Consult    Reason for Consult:  Right Lower Extremity DVT  Requesting Physician:  Dr Nilsa Dade MD  MRN #:  [id number]  History of Present Illness: This is a 89 y.o. female with history of A-fib, hypertension, CHF, COPD, DVT on Eliquis  presenting to the emergency department for evaluation of leg pain. Patient reports that yesterday she began to develop worsened pain in her right leg and today has been unable to walk due to her pain. She does arrive with paperwork from her facility where she had x-rays of her right knee and ankle performed. X-ray of her knee demonstrated a bone lesion in her femur. Patient was seen in the ER on 9/5 for right leg pain with ultrasound at that time demonstrating bilateral nonocclusive DVTs. Discussed with vascular surgery and started on Eliquis . She followed up with vascular surgery team on 10/1. Plan for repeat ultrasound in 3 months as she was asymptomatic at that time.   On work up today she underwent lower extremity ultrasounds showing an extension of the previous non occlusive DVT in her right lower extremity. Extension is now in her right common femoral vein. Vascular Surgery Consulted to evaluate.   Past Medical History:  Diagnosis Date   Anemia    past hx   Arthritis    hands and feet   Chronic kidney disease    COPD (chronic obstructive pulmonary disease) (HCC)    Dyspnea    Dysrhythmia    Pt reports slow HR and papatations   Heart murmur    past hx   Hypercholesteremia    Hypertension    Rheumatic fever    (x2) in past   Wears dentures    full upper    Past Surgical History:  Procedure Laterality Date   ABDOMINAL HYSTERECTOMY     APPENDECTOMY     as a child   CATARACT EXTRACTION W/PHACO Left 01/10/2015   Procedure: CATARACT EXTRACTION PHACO AND INTRAOCULAR LENS PLACEMENT (IOC);  Surgeon: Donzell Arlyce Budd, MD;  Location: [facility];  Service: Ophthalmology;  Laterality: Left;   COMPLEX WOUND CLOSURE Left 11/15/2021    Procedure: COMPLEX WOUND CLOSURE;  Surgeon: Edda Mt, MD;  Location: [facility];  Service: ENT;  Laterality: Left;   EXCISION MASS HEAD Left 11/15/2021   Procedure: EXCISION MALIGNANT MASS EAR;  Surgeon: Edda Mt, MD;  Location: [facility];  Service: ENT;  Laterality: Left;   INTRAMEDULLARY (IM) NAIL INTERTROCHANTERIC Left 03/28/2020   Procedure: INTRAMEDULLARY (IM) NAIL INTERTROCHANTRIC-Hip Fracture;  Surgeon: Tobie Priest, MD;  Location: [facility];  Service: Orthopedics;  Laterality: Left;   TONSILLECTOMY      Allergies  Allergen Reactions   Metoprolol Shortness Of Breath   Penicillins Other (See Comments)    Internal swelling including throat TOLERATES CEFTRIAXONE    Methotrexate And Trimetrexate Nausea Only   Doxycycline Hyclate Rash    Prior to Admission medications   Medication Sig Start Date End Date Taking? Authorizing Provider  acetaminophen  (TYLENOL ) 650 MG CR tablet Take 650 mg by mouth 2 (two) times daily.    [provider]  albuterol  (VENTOLIN  HFA) 108 (90 Base) MCG/ACT inhaler Inhale 2 puffs into the lungs every 4 (four) hours as needed for wheezing or shortness of breath. 05/03/22   Sung, Jade J, MD  APIXABAN  (ELIQUIS ) VTE STARTER PACK (10MG  AND 5MG ) Take as directed on package: start with two-5mg  tablets twice daily for 7 days. On day 8, switch to one-5mg  tablet twice daily. 01/03/24   Ray,  Neha, MD  cefUROXime  (CEFTIN ) 250 MG tablet Take 1 tablet (250 mg total) by mouth 2 (two) times daily with a meal. 07/22/23   Bradler, Evan K, MD  diltiazem  (CARDIZEM  LA) 240 MG 24 hr tablet Take 240 mg by mouth daily. 02/04/23   [provider]  potassium chloride  (KLOR-CON ) 10 MEQ tablet Take 10 mEq by mouth daily.    [provider]  sennosides-docusate sodium  (SENOKOT-S) 8.6-50 MG tablet Take 2 tablets by mouth 2 (two) times daily.    [provider]  simvastatin  (ZOCOR ) 10 MG tablet Take 1 tablet (10 mg total) by mouth every evening. 01/25/22  01/29/24  Krishnan, Sendil K, MD  SPIRIVA HANDIHALER 18 MCG inhalation capsule Place 18 mcg into inhaler and inhale daily.    [provider]  torsemide  (DEMADEX ) 20 MG tablet Take 1 tablet (20 mg total) by mouth every other day. 03/03/23   Patel, Sona, MD  traMADol  (ULTRAM ) 50 MG tablet Take 1 tablet (50 mg total) by mouth every 6 (six) hours as needed for moderate pain. 01/25/22   Krishnan, Sendil K, MD    Social History   Socioeconomic History   Marital status: Single    Spouse name: Not on file   Number of children: Not on file   Years of education: Not on file   Highest education level: Not on file  Occupational History   Not on file  Tobacco Use   Smoking status: Former    Current packs/day: 0.00    Average packs/day: 0.3 packs/day for 60.0 years (15.0 ttl pk-yrs)    Types: Cigarettes    Start date: 47    Quit date: 2020    Years since quitting: 5.7   Smokeless tobacco: Never  Vaping Use   Vaping status: Never Used  Substance and Sexual Activity   Alcohol use: No   Drug use: Not Currently   Sexual activity: Not on file  Other Topics Concern   Not on file  Social History Narrative   Not on file   Social Drivers of Health   Financial Resource Strain: Not on file  Food Insecurity: No Food Insecurity (02/15/2023)   Hunger Vital Sign    Worried About Running Out of Food in the Last Year: Never true    Ran Out of Food in the Last Year: Never true  Transportation Needs: No Transportation Needs (02/15/2023)   PRAPARE - Administrator, Civil Service (Medical): No    Lack of Transportation (Non-Medical): No  Physical Activity: Not on file  Stress: Not on file  Social Connections: Not on file  Intimate Partner Violence: Not At Risk (02/15/2023)   Humiliation, Afraid, Rape, and Kick questionnaire    Fear of Current or Ex-Partner: No    Emotionally Abused: No    Physically Abused: No    Sexually Abused: No     Family History  Problem Relation  Age of Onset   Cervical cancer Mother    Heart attack Father    Prostate cancer Father    Asthma Brother     ROS: Otherwise negative unless mentioned in HPI  Physical Examination  Vitals:   02/07/24 1330 02/07/24 1442  BP: 110/76   Pulse: 71   Resp: 17   Temp:  97.8 F (36.6 C)  SpO2: 100%    Body mass index is 26.67 kg/m.  General:  WDWN in NAD Gait: Not observed HENT: WNL, normocephalic Pulmonary: normal non-labored breathing, without Rales, rhonchi,  wheezing Cardiac: irregular, History of Atrial Fibrillation, without  Murmurs, rubs or gallops; without carotid bruits Abdomen: Positive bowel sounds throughout, soft, NT/ND, no masses Skin: without rashes Vascular Exam/Pulses: Palpable pulses throughout except unable to palpate in right DP/PT due to +2 edema and cellulitis. Extremities: without ischemic changes, without Gangrene , with cellulitis; without open wounds;  Musculoskeletal: no muscle wasting or atrophy  Neurologic: A&O X 3;  No focal weakness or paresthesias are detected; speech is fluent/normal Psychiatric:  The pt has Normal affect. Lymph:  Unremarkable  CBC    Component Value Date/Time   WBC 7.7 02/07/2024 0948   RBC 3.72 (L) 02/07/2024 0948   HGB 12.0 02/07/2024 0948   HCT 37.2 02/07/2024 0948   PLT 242 02/07/2024 0948   MCV 100.0 02/07/2024 0948   MCH 32.3 02/07/2024 0948   MCHC 32.3 02/07/2024 0948   RDW 13.2 02/07/2024 0948   LYMPHSABS 1.4 02/07/2024 0948   MONOABS 0.5 02/07/2024 0948   EOSABS 0.2 02/07/2024 0948   BASOSABS 0.0 02/07/2024 0948    BMET    Component Value Date/Time   NA 141 02/07/2024 0948   K 4.3 02/07/2024 0948   CL 104 02/07/2024 0948   CO2 26 02/07/2024 0948   GLUCOSE 127 (H) 02/07/2024 0948   BUN 25 (H) 02/07/2024 0948   CREATININE 1.71 (H) 02/07/2024 0948   CALCIUM 10.0 02/07/2024 0948   GFRNONAA 29 (L) 02/07/2024 0948    COAGS: Lab Results  Component Value Date   INR 1.7 (H) 02/07/2024   INR 1.1  01/03/2024   INR 1.2 01/21/2022     Non-Invasive Vascular Imaging:   CLINICAL DATA:  Leg swelling previously documented DVT identified on 01/03/2024   EXAM:02/07/2024 RIGHT LOWER EXTREMITY VENOUS DOPPLER ULTRASOUND   TECHNIQUE: Gray-scale sonography with compression, as well as color and duplex ultrasound, were performed to evaluate the deep venous system(s) from the level of the common femoral vein through the popliteal and proximal calf veins.   COMPARISON:  Bilateral lower extremity venous Doppler ultrasound 01/03/2024.   FINDINGS: Right lower extremity   Common femoral vein: Partially compressible acute DVT nonocclusive. Including the saphenofemoral junction.   Profunda vein: Normal compressibility and respiratory phasicity and response to augmentation.   Femoral vein: Nonocclusive thrombus again seen with partial compressibility.   Popliteal vein: The proximal popliteal vein is patent, however distal popliteal vein is occluded thrombus.   Calf veins: Noncompressible in a solitary posterior tibial vein and both peroneal veins.   IMPRESSION: Worsening acute right lower extremity DVT which now involves the common femoral vein (nonocclusive) and the popliteal vein (complete occlusion secondary to acute distal thrombosis).  Statin:  Yes.   Beta Blocker:  No. Aspirin :  No. ACEI:  No. ARB:  No. CCB use:  Yes Other antiplatelets/anticoagulants:  Yes.   Eliquis  5 mg Daily   ASSESSMENT/PLAN: This is a 89 y.o. female who presented to [facility] emergency room today with worsening right lower extremity swelling and pain. She was unable to walk. She was recently seen in the ER for right lower extremity DVT and placed on eliquis  10 mg BID for 7 days then reduced to 5 mg BID indefinitely. She [facility] without any symptoms to follow up again in 3 months with Venous ultrasounds.   She returns today to the Emergency Room with increased pain and  swelling to her right leg. Upon work up she is noted to have an extension of the DVT in her  right lower extremity thus requiring a right lower extremity thrombectomy. Patient at this time refuses any intervention. She would like to find other anticoagulation to protect her from thrombosis.   I had a long detailed discussion with the patient today regarding her current condition.  I explained to her that she most likely has had an Eliquis  failure meaning the current medication that she is on has not prevented the clot in her right leg from extending nor has it helped resolve the clot.  In doing so she is at higher risk for heart attack, stroke, death and pulmonary embolism.  She again states that she does not want a procedure and would rather pursue other avenues of medication therapy only.  At this time I had a discussion with the patient her daughter at the bedside who then got her brother on the phone as well.  Again I stated the current condition with the clinical risks of not doing the procedure as well as the options for treatment going forward.  Their mother decided again she did not want to have a procedure and would only like to proceed with anticoagulation therapy only.  Patient's son states that the patient has a DNR at this time.  Therefore vascular surgery at this time recommends patient be placed on heparin  infusion.  We recommend that the patient be admitted to the hospitalist service.  We recommend consultation to hematology to further investigate the Eliquis  failure and other options for anticoagulation.  Vascular surgery recommends transitioning the patient to Coumadin therapy while on a heparin  infusion until her INR is become therapeutic.  Patient to follow-up with vein and vascular surgery after being discharged from the hospital.  She will require venous ultrasounds in clinic today her visit.  Vascular surgery will sign off this case at this time  -I discussed the case in detail with Dr.  Cordella Shawl MD and he agrees with the plan.   Gwendlyn JONELLE Shank [facility] 02/07/2024 3:38 PM

## 2024-02-07 NOTE — ED Notes (Signed)
 Called lab at this time for missing blood work. Lab to run.

## 2024-02-07 NOTE — ED Provider Notes (Addendum)
 [facility] Provider Note    Event Date/Time   First MD Initiated Contact with Patient 02/07/24 [phone]     (approximate)   History   Leg Pain   HPI  Roberta Ross is a 89 year old female with history of A-fib, hypertension, CHF, COPD, DVT on Eliquis  presenting to the emergency department for evaluation of leg pain.  Patient reports that yesterday she began to develop worsened pain in her right leg and today has been unable to walk due to her pain.  She does arrive with paperwork from her facility where she had x-rays of her right knee and ankle performed.  X-[name] of her knee demonstrated a bone lesion in her femur.  Was seen by myself on 9/5 for right leg pain with ultrasound at that time demonstrating bilateral nonocclusive DVTs.  Discussed with vascular surgery and started on Eliquis .  She followed up with vascular surgery team on 10/1.  Plan for repeat ultrasound in 3 months.     Physical Exam   Triage Vital Signs: ED Triage Vitals  Encounter Vitals Group     BP 02/07/24 0949 97/66     Girls Systolic BP Percentile --      Girls Diastolic BP Percentile --      Boys Systolic BP Percentile --      Boys Diastolic BP Percentile --      Pulse Rate 02/07/24 0945 77     Resp 02/07/24 0945 18     Temp 02/07/24 0945 97.6 F (36.4 C)     Temp Source 02/07/24 0945 Oral     SpO2 02/07/24 0945 100 %     Weight --      Height 02/07/24 0946 5' 5 (1.651 m)     Head Circumference --      Peak Flow --      Pain Score 02/07/24 0945 8     Pain Loc --      Pain Education --      Exclude from Growth Chart --     Most recent vital signs: Vitals:   02/07/24 1330 02/07/24 1442  BP: 110/76   Pulse: 71   Resp: 17   Temp:  97.8 F (36.6 C)  SpO2: 100%      General: Awake, interactive  CV:  Good peripheral perfusion Resp:  Unlabored respirations Abd:  Nondistended.  Neuro:  Symmetric facial movement, fluid speech Other:   There is swelling of  the bilateral lower extremities greater on the right.  There is associated erythema.  Intact distal pulses.  There is generalized tenderness throughout the leg without specific area of point tenderness appreciable.   ED Results / Procedures / Treatments   Labs (all labs ordered are listed, but only abnormal results are displayed) Labs Reviewed  CBC WITH DIFFERENTIAL/PLATELET - Abnormal; Notable for the following components:      Result Value   RBC 3.72 (*)    All other components within normal limits  COMPREHENSIVE METABOLIC PANEL WITH GFR - Abnormal; Notable for the following components:   Glucose, Bld 127 (*)    BUN 25 (*)    Creatinine, Ser 1.71 (*)    AST 12 (*)    GFR, Estimated 29 (*)    All other components within normal limits  PROTIME-INR - Abnormal; Notable for the following components:   Prothrombin Time 21.3 (*)    INR 1.7 (*)    All other components within normal limits  APTT  HEPARIN   LEVEL (UNFRACTIONATED)  HEPARIN  LEVEL (UNFRACTIONATED)     EKG EKG independently reviewed and interpreted by myself demonstrates:    RADIOLOGY Imaging independently reviewed and interpreted by myself demonstrates:   Formal Radiology Read:  US  Venous Img Lower Unilateral Right Result Date: 02/07/2024 CLINICAL DATA:  Leg swelling previously documented DVT identified on 01/03/2024 EXAM: RIGHT LOWER EXTREMITY VENOUS DOPPLER ULTRASOUND TECHNIQUE: Gray-scale sonography with compression, as well as color and duplex ultrasound, were performed to evaluate the deep venous system(s) from the level of the common femoral vein through the popliteal and proximal calf veins. COMPARISON:  Bilateral lower extremity venous Doppler ultrasound 01/03/2024. FINDINGS: Right lower extremity Common femoral vein: Partially compressible acute DVT nonocclusive. Including the saphenofemoral junction. Profunda vein: Normal compressibility and respiratory phasicity and response to augmentation. Femoral vein:  Nonocclusive thrombus again seen with partial compressibility. Popliteal vein: The proximal popliteal vein is patent, however distal popliteal vein is occluded thrombus. Calf veins: Noncompressible in a solitary posterior tibial vein and both peroneal veins. IMPRESSION: Worsening acute right lower extremity DVT which now involves the common femoral vein (nonocclusive) and the popliteal vein (complete occlusion secondary to acute distal thrombosis). Sent to [facility] for physician communication Electronically Signed   By: Cordella Banner   On: 02/07/2024 13:45   CT FEMUR RIGHT WO CONTRAST Result Date: 02/07/2024 CLINICAL DATA:  Bone lesion on radiography EXAM: CT OF THE LOWER RIGHT EXTREMITY WITHOUT CONTRAST TECHNIQUE: Multidetector CT imaging of the right lower extremity was performed according to the standard protocol. RADIATION DOSE REDUCTION: This exam was performed according to the departmental dose-optimization program which includes automated exposure control, adjustment of the mA and/or kV according to patient size and/or use of iterative reconstruction technique. COMPARISON:  None Available. FINDINGS: Bones/Joint/Cartilage 5.1 by 1.5 by 1.3 cm sclerotic lesion in the mid to distal femoral diaphysis with general chondroid matrix favoring enchondroma over bone infarct. Sharp transition zone. No significant degree of endosteal scalloping. No cortical breakthrough or soft tissue component. Ligaments Suboptimally assessed by CT. Muscles and Tendons Unremarkable Soft tissues Atheromatous vascular calcifications. Sigmoid colon diverticulosis. Atrophic right gluteus minimus muscle. Spurring of the pubic bones. IMPRESSION: 1. 5.1 by 1.5 by 1.3 cm sclerotic lesion in the mid to distal femoral diaphysis with general chondroid matrix favoring benign enchondroma. No worrisome/adverse features to suggest chondrosarcoma. 2. Atheromatous vascular calcifications. 3. Sigmoid colon diverticulosis. 4. Atrophic right gluteus  minimus muscle. Electronically Signed   By: Ryan Salvage M.D.   On: 02/07/2024 10:52    PROCEDURES:  Critical Care performed: Yes, see critical care procedure note(s)  CRITICAL CARE Performed by: Nilsa Dade   Total critical care time: 31 minutes  Critical care time was exclusive of separately billable procedures and treating other patients.  Critical care was necessary to treat or prevent imminent or life-threatening deterioration.  Critical care was time spent personally by me on the following activities: development of treatment plan with patient and/or surrogate as well as nursing, discussions with consultants, evaluation of patient's response to treatment, examination of patient, obtaining history from patient or surrogate, ordering and performing treatments and interventions, ordering and review of laboratory studies, ordering and review of radiographic studies, pulse oximetry and re-evaluation of patient's condition.   Procedures   MEDICATIONS ORDERED IN ED: Medications  heparin  ADULT infusion 100 units/mL (25000 units/250mL) (1,200 Units/hr Intravenous New Bag/Given 02/07/24 1524)  HYDROcodone -acetaminophen  (NORCO/VICODIN) 5-325 MG per tablet 1 tablet (1 tablet Oral Given 02/07/24 1132)  heparin  bolus via infusion 5,000 Units (5,000 Units Intravenous  Bolus from Bag 02/07/24 1526)     IMPRESSION / MDM / ASSESSMENT AND PLAN / ED COURSE  I reviewed the triage vital signs and the nursing notes.  Differential diagnosis includes, but is not limited to, propagating DVT, leg pain related to bone lesion noted on outpatient x-Kemyah Buser, cellulitis  Patient's presentation is most consistent with acute presentation with potential threat to life or bodily function.  89 year old female presenting with leg pain in the setting of known DVT on Eliquis .  Stable vitals on presentation.  Will obtain ultrasound to assess her known DVT and ensure it is not propagating.  Will also obtain CT of  the femur for further characterization of her bone lesion noted on outpatient x-Clarence Dunsmore.  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  Case reviewed with Dr. Ezra with orthopedics who agreed with likely benign appearance of CT findings.  He does note that these typically do not cause significant pain.  With this, will continue to await results of outpatient ultrasound, plan for outpatient follow-up with orthopedics regarding bone mass. [NR]  1356 US  Venous Img Lower Unilateral Right Notified by radiologist that patient's ultrasound did demonstrate progressive DVT since prior study.  Specifically patient has developed occlusive thrombus within her right popliteal as well as nonocclusive thrombus within the right common femoral vein not noted on prior ultrasound.  Suspect that this is the cause of the patient's worsening right leg pain and swelling.  Given worsening DVT on anticoagulation and proximal location, will discuss with vascular surgery team. [NR]  1406 US  Venous Img Lower Unilateral Right Case reviewed with Dr. Jama.  He does note given more proximal location of DVT patient is a potential thrombectomy candidate.  His team will evaluate.  He does recommend starting the patient on IV heparin  with likely transition to warfarin given her failure on Eliquis .  Will reach out to hospitalist team to discuss admission. [NR]  1514 Case discussed with Dr. Roann with hospitalist team.  He will evaluate for anticipated admission. [NR]  1549 Dr. Roann did request that I speak with hematology to confirm that patient would benefit from inpatient treatment of her DVT.  I spoke with Dr. Babara who did note that patient has limited treatment options as she is failing her Eliquis  therapy.  She does agree that a heparin  drip to warfarin transition is appropriate.  Dr. Roann updated.  Continue plan for admission. [NR]    Clinical  Course User Index [NR] Levander Slate, MD   [phone] Received update from NP pace with vascular surgery team.  Patient declining thrombectomy.  He did obtain additional history from family of multiple family members with history of blood clots.  In the setting of this he does recommend hematology consultation for patient's anticoagulation plan.  Information relayed to hospitalist team.  Continue plan for admission.  FINAL CLINICAL IMPRESSION(S) / ED DIAGNOSES   Final diagnoses:  Acute deep vein thrombosis (DVT) of proximal vein of right lower extremity (HCC)     Rx / DC Orders   ED Discharge Orders     None        Note:  This document was prepared using Dragon voice recognition software and may include unintentional dictation errors.   Levander Slate, MD 02/07/24 8481    Levander Slate, MD 02/07/24 8472    Levander Slate, MD 02/07/24 [phone]

## 2024-02-07 NOTE — ED Triage Notes (Signed)
 Pt has hx of blood clots in the leg. Per facility pts leg I warm to the touch. Pt able to walk yesterday without issues but is unable to today. Pt in from [place]. Pt c/o not being able to walk due to pain in the right leg. Per facility she has hx of lesion on the femur. Pt takes eliquis .

## 2024-02-07 NOTE — Consult Note (Signed)
 Pharmacy Consult Note - Anticoagulation  Pharmacy Consult for heparin  Indication: DVT  PATIENT MEASUREMENTS: Height: 5' 5 (165.1 cm) Weight: 72.7 kg (160 lb 4.4 oz) IBW/kg (Calculated) : 57 HEPARIN  DW (KG): 71.7  VITAL SIGNS: Temp: 97.8 F (36.6 C) (10/10 1442) Temp Source: Oral (10/10 1442) BP: 110/76 (10/10 1330) Pulse Rate: 71 (10/10 1330)  Recent Labs    02/07/24 0948  HGB 12.0  HCT 37.2  PLT 242  LABPROT 21.3*  INR 1.7*  CREATININE 1.71*    Estimated Creatinine Clearance: 23.2 mL/min (A) (by C-G formula based on SCr of 1.71 mg/dL (H)).  PAST MEDICAL HISTORY: Past Medical History:  Diagnosis Date   Anemia    past hx   Arthritis    hands and feet   Chronic kidney disease    COPD (chronic obstructive pulmonary disease) (HCC)    Dyspnea    Dysrhythmia    Pt reports slow HR and papatations   Heart murmur    past hx   Hypercholesteremia    Hypertension    Rheumatic fever    (x2) in past   Wears dentures    full upper    ASSESSMENT: 89 y.o. female with PMH including recent DVT diagnosis is presenting with worsening DVT. She had been prescribed Eliquis  for DVT diagnosed 01/03/2024 but per discussions between RN and EDP, her facility had not been giving it regularly -- last dose unknown. Pharmacy has been consulted to initiate and manage heparin  intravenous infusion.  Pertinent medications: Has not been taking Eliquis  as prescribed  Goal(s) of therapy: Heparin  level 0.3 - 0.7 units/mL Monitor platelets by anticoagulation protocol: Yes   Baseline anticoagulation labs: Recent Labs    02/07/24 0948  INR 1.7*  HGB 12.0  PLT 242  Baseline heparin   ordered Will order baseline anti-Xa level to assess for false elevation.   PLAN: Give 5000 units bolus x1; then start heparin  infusion at 1200 units/hour. Check heparin  level in 8 hours, then daily once at least two levels are consecutively therapeutic. Monitor CBC daily while on heparin  infusion.  Will  M. Lenon, PharmD, BCPS Clinical Pharmacist 02/07/2024 3:15 PM

## 2024-02-07 NOTE — ED Notes (Addendum)
 Pt c/o new onset of dizziness. Pt stating my eyes can't focus. Denies any new numbness or tingling. Pt noted to have bruise on the left side of cheek. Unknown when pt sustained injury to face. Per pt no falls. MD at bedside. Now pt stating she was dizzy when she got up this morning. Pt denied any falls with this RN but with MD at bedside pt stating she feel on Oct. 3rd.

## 2024-02-07 NOTE — H&P (Signed)
 History and Physical    Kylia Grajales FMW:[id number] DOB: 08/04/1933 DOA: 02/07/2024  DOS: the patient was seen and examined on 02/07/2024  PCP: Lenon Layman ORN, MD   Patient coming from: Facility  I have personally briefly reviewed patient's old medical records in [facility]  Chief Complaint: Bilateral lower extremity pain  HPI: Julie-Anne Torain is a pleasant 89 y.o. female with medical history significant for A-fib on Eliquis , HTN, CHF, COPD, DVT on Eliquis  who presented to ED for evaluation of bilateral leg pain.  Patient was diagnosed with DVT recently on right lower extremity.  She was started on Eliquis  10 mg twice daily for 7 days then reduce to 5 mg twice daily indefinitely.  She followed up in vein and vascular clinic without any symptoms to follow-up again in 3 months with venous ultrasound.  Patient reported that she started having pain on her right leg which has worsened and she was not able to walk due to pain.  She had x-ray done at her facility which demonstrated a bone lesion in her femur.  EDP discussed with orthopedic team and they advised that this is a benign lesion and no further workup needed at this point.  Patient was in the ER on 9/5 for right leg pain with that time ultrasound demonstrated bilateral nonocclusive DVTs.  But today lower extremity ultrasound showed an extension of the previous nonocclusive DVT in her right lower extremity to right common femoral vein.    ED Course: Upon arrival to the ED, patient is found to be in severe pain of right lower extremity.  She was found to have worsening DVT on right lower extremity then 1 month prior.  Vascular surgery was consulted in the ED who advised that patient may benefit from thrombectomy.  Vascular surgery came and evaluated patient and patient did not want to pursue any procedures.  ED discussed with hematology Dr. Babara who advised that patient failed outpatient Eliquis  and now she needs to  be on Coumadin.  She advised to start patient on heparin  drip and transition to Coumadin.  Hospitalist service was consulted for evaluation for admission.   Review of Systems:  ROS  All other systems negative except as noted in the HPI.  Past Medical History:  Diagnosis Date   Anemia    past hx   Arthritis    hands and feet   Chronic kidney disease    COPD (chronic obstructive pulmonary disease) (HCC)    Dyspnea    Dysrhythmia    Pt reports slow HR and papatations   Heart murmur    past hx   Hypercholesteremia    Hypertension    Rheumatic fever    (x2) in past   Wears dentures    full upper    Past Surgical History:  Procedure Laterality Date   ABDOMINAL HYSTERECTOMY     APPENDECTOMY     as a child   CATARACT EXTRACTION W/PHACO Left 01/10/2015   Procedure: CATARACT EXTRACTION PHACO AND INTRAOCULAR LENS PLACEMENT (IOC);  Surgeon: Donzell Arlyce Budd, MD;  Location: [facility];  Service: Ophthalmology;  Laterality: Left;   COMPLEX WOUND CLOSURE Left 11/15/2021   Procedure: COMPLEX WOUND CLOSURE;  Surgeon: Edda Mt, MD;  Location: [facility];  Service: ENT;  Laterality: Left;   EXCISION MASS HEAD Left 11/15/2021   Procedure: EXCISION MALIGNANT MASS EAR;  Surgeon: Edda Mt, MD;  Location: [facility];  Service: ENT;  Laterality: Left;   INTRAMEDULLARY (IM) NAIL  INTERTROCHANTERIC Left 03/28/2020   Procedure: INTRAMEDULLARY (IM) NAIL INTERTROCHANTRIC-Hip Fracture;  Surgeon: Tobie Priest, MD;  Location: [facility];  Service: Orthopedics;  Laterality: Left;   TONSILLECTOMY       reports that she quit smoking about 5 years ago. Her smoking use included cigarettes. She started smoking about 65 years ago. She has a 15 pack-year smoking history. She has never used smokeless tobacco. She reports that she does not currently use drugs. She reports that she does not drink alcohol.  Allergies  Allergen Reactions   Metoprolol Shortness Of Breath   Penicillins Other (See  Comments)    Internal swelling including throat TOLERATES CEFTRIAXONE    Methotrexate And Trimetrexate Nausea Only   Doxycycline Hyclate Rash    Family History  Problem Relation Age of Onset   Cervical cancer Mother    Heart attack Father    Prostate cancer Father    Asthma Brother     Prior to Admission medications   Medication Sig Start Date End Date Taking? Authorizing Provider  acetaminophen  (TYLENOL ) 650 MG CR tablet Take 650 mg by mouth 2 (two) times daily.   Yes [provider]  albuterol  (VENTOLIN  HFA) 108 (90 Base) MCG/ACT inhaler Inhale 2 puffs into the lungs every 4 (four) hours as needed for wheezing or shortness of breath. 05/03/22  Yes Sung, Jade J, MD  ammonium lactate (LAC-HYDRIN) 12 % lotion Apply 1 Application topically daily.   Yes [provider]  apixaban  (ELIQUIS ) 5 MG TABS tablet Take 5 mg by mouth 2 (two) times daily.   Yes [provider]  diclofenac Sodium (VOLTAREN) 1 % GEL Apply 4 g topically 4 (four) times daily. Both knees   Yes [provider]  diltiazem  (CARDIZEM  LA) 240 MG 24 hr tablet Take 240 mg by mouth daily. 02/04/23  Yes [provider]  folic acid (FOLVITE) 400 MCG tablet Take 400 mcg by mouth in the morning.   Yes [provider]  meloxicam (MOBIC) 7.5 MG tablet Take 7.5 mg by mouth daily.   Yes [provider]  polyethylene glycol (MIRALAX  / GLYCOLAX ) 17 g packet Take 17 g by mouth daily.   Yes [provider]  potassium chloride  (KLOR-CON ) 10 MEQ tablet Take 10 mEq by mouth daily.   Yes [provider]  prednisoLONE 5 MG TABS tablet Take 5 mg by mouth daily.   Yes [provider]  sennosides-docusate sodium  (SENOKOT-S) 8.6-50 MG tablet Take 2 tablets by mouth 2 (two) times daily.   Yes [provider]  sertraline (ZOLOFT) 50 MG tablet Take 50 mg by mouth daily. 02/03/24  Yes [provider]  simvastatin  (ZOCOR ) 10 MG tablet Take 1 tablet (10  mg total) by mouth every evening. 01/25/22 02/07/24 Yes Krishnan, Sendil K, MD  SPIRIVA HANDIHALER 18 MCG inhalation capsule Place 18 mcg into inhaler and inhale daily as needed (shortness of breath).   Yes [provider]  torsemide  (DEMADEX ) 20 MG tablet Take 1 tablet (20 mg total) by mouth every other day. 03/03/23  Yes Patel, Sona, MD  Vitamin D, Ergocalciferol, (DRISDOL) 1.25 MG (50000 UNIT) CAPS capsule Take 50,000 Units by mouth once a week. 08/19/23  Yes [provider]  Zinc Oxide 20 % PSTE Apply 1 application  topically 3 (three) times daily. Bottom/sacrum   Yes [provider]  APIXABAN  (ELIQUIS ) VTE STARTER PACK (10MG  AND 5MG ) Take as directed on package: start with two-5mg  tablets twice daily for 7 days. On day 8,  switch to one-5mg  tablet twice daily. Patient not taking: Reported on 02/07/2024 01/03/24   Levander Slate, MD  cefUROXime  (CEFTIN ) 250 MG tablet Take 1 tablet (250 mg total) by mouth 2 (two) times daily with a meal. Patient not taking: Reported on 02/07/2024 07/22/23   Bradler, Evan K, MD  traMADol  (ULTRAM ) 50 MG tablet Take 1 tablet (50 mg total) by mouth every 6 (six) hours as needed for moderate pain. Patient not taking: Reported on 02/07/2024 01/25/22   Krishnan, Sendil K, MD    Physical Exam: Vitals:   02/07/24 1300 02/07/24 1330 02/07/24 1440 02/07/24 1442  BP:  110/76    Pulse: 95 71    Resp: 15 17    Temp:    97.8 F (36.6 C)  TempSrc:    Oral  SpO2: 100% 100%    Weight:   72.7 kg   Height:   5' 5 (1.651 m)     Physical Exam   Constitutional: Alert, awake, calm, comfortable HEENT: Neck supple Respiratory: Clear to auscultation B/L, no wheezing, no rales.  Cardiovascular: Regular rate and rhythm, no murmurs / rubs / gallops. No extremity edema. 2+ pedal pulses. No carotid bruits.  Abdomen: Soft, no tenderness, Bowel sounds positive.  Musculoskeletal: no clubbing / cyanosis. Good ROM, no contractures. Normal muscle tone.  Skin: no  rashes, lesions, ulcers. Neurologic: CN 2-12 grossly intact. Sensation intact, No focal deficit identified Psychiatric: Alert and oriented x 3. Normal mood.    Labs on Admission: I have personally reviewed following labs and imaging studies  CBC: Recent Labs  Lab 02/07/24 0948  WBC 7.7  NEUTROABS 5.5  HGB 12.0  HCT 37.2  MCV 100.0  PLT 242   Basic Metabolic Panel: Recent Labs  Lab 02/07/24 0948  NA 141  K 4.3  CL 104  CO2 26  GLUCOSE 127*  BUN 25*  CREATININE 1.71*  CALCIUM 10.0   GFR: Estimated Creatinine Clearance: 23.2 mL/min (A) (by C-G formula based on SCr of 1.71 mg/dL (H)). Liver Function Tests: Recent Labs  Lab 02/07/24 0948  AST 12*  ALT 8  ALKPHOS 88  BILITOT 0.8  PROT 7.3  ALBUMIN 3.8   No results for input(s): LIPASE, AMYLASE in the last 168 hours. No results for input(s): AMMONIA in the last 168 hours. Coagulation Profile: Recent Labs  Lab 02/07/24 0948  INR 1.7*   Cardiac Enzymes: No results for input(s): CKTOTAL, CKMB, CKMBINDEX, TROPONINI, TROPONINIHS in the last 168 hours. BNP (last 3 results) No results for input(s): BNP in the last 8760 hours. HbA1C: No results for input(s): HGBA1C in the last 72 hours. CBG: No results for input(s): GLUCAP in the last 168 hours. Lipid Profile: No results for input(s): CHOL, HDL, LDLCALC, TRIG, CHOLHDL, LDLDIRECT in the last 72 hours. Thyroid  Function Tests: No results for input(s): TSH, T4TOTAL, FREET4, T3FREE, THYROIDAB in the last 72 hours. Anemia Panel: No results for input(s): VITAMINB12, FOLATE, FERRITIN, TIBC, IRON, RETICCTPCT in the last 72 hours. Urine analysis:    Component Value Date/Time   COLORURINE STRAW (A) 10/12/2023 1627   APPEARANCEUR CLEAR (A) 10/12/2023 1627   LABSPEC 1.008 10/12/2023 1627   PHURINE 6.0 10/12/2023 1627   GLUCOSEU NEGATIVE 10/12/2023 1627   HGBUR NEGATIVE 10/12/2023 1627   BILIRUBINUR NEGATIVE  10/12/2023 1627   KETONESUR NEGATIVE 10/12/2023 1627   PROTEINUR NEGATIVE 10/12/2023 1627   NITRITE NEGATIVE 10/12/2023 1627   LEUKOCYTESUR TRACE (A) 10/12/2023 1627    Radiological Exams on Admission: I have personally  reviewed images US  Venous Img Lower Unilateral Right Result Date: 02/07/2024 CLINICAL DATA:  Leg swelling previously documented DVT identified on 01/03/2024 EXAM: RIGHT LOWER EXTREMITY VENOUS DOPPLER ULTRASOUND TECHNIQUE: Gray-scale sonography with compression, as well as color and duplex ultrasound, were performed to evaluate the deep venous system(s) from the level of the common femoral vein through the popliteal and proximal calf veins. COMPARISON:  Bilateral lower extremity venous Doppler ultrasound 01/03/2024. FINDINGS: Right lower extremity Common femoral vein: Partially compressible acute DVT nonocclusive. Including the saphenofemoral junction. Profunda vein: Normal compressibility and respiratory phasicity and response to augmentation. Femoral vein: Nonocclusive thrombus again seen with partial compressibility. Popliteal vein: The proximal popliteal vein is patent, however distal popliteal vein is occluded thrombus. Calf veins: Noncompressible in a solitary posterior tibial vein and both peroneal veins. IMPRESSION: Worsening acute right lower extremity DVT which now involves the common femoral vein (nonocclusive) and the popliteal vein (complete occlusion secondary to acute distal thrombosis). Sent to [facility] for physician communication Electronically Signed   By: Cordella Banner   On: 02/07/2024 13:45   CT FEMUR RIGHT WO CONTRAST Result Date: 02/07/2024 CLINICAL DATA:  Bone lesion on radiography EXAM: CT OF THE LOWER RIGHT EXTREMITY WITHOUT CONTRAST TECHNIQUE: Multidetector CT imaging of the right lower extremity was performed according to the standard protocol. RADIATION DOSE REDUCTION: This exam was performed according to the departmental dose-optimization program which includes  automated exposure control, adjustment of the mA and/or kV according to patient size and/or use of iterative reconstruction technique. COMPARISON:  None Available. FINDINGS: Bones/Joint/Cartilage 5.1 by 1.5 by 1.3 cm sclerotic lesion in the mid to distal femoral diaphysis with general chondroid matrix favoring enchondroma over bone infarct. Sharp transition zone. No significant degree of endosteal scalloping. No cortical breakthrough or soft tissue component. Ligaments Suboptimally assessed by CT. Muscles and Tendons Unremarkable Soft tissues Atheromatous vascular calcifications. Sigmoid colon diverticulosis. Atrophic right gluteus minimus muscle. Spurring of the pubic bones. IMPRESSION: 1. 5.1 by 1.5 by 1.3 cm sclerotic lesion in the mid to distal femoral diaphysis with general chondroid matrix favoring benign enchondroma. No worrisome/adverse features to suggest chondrosarcoma. 2. Atheromatous vascular calcifications. 3. Sigmoid colon diverticulosis. 4. Atrophic right gluteus minimus muscle. Electronically Signed   By: Ryan Salvage M.D.   On: 02/07/2024 10:52    EKG: My personal interpretation of EKG shows: A-fib    Assessment/Plan Principal Problem:   Acute DVT (deep venous thrombosis) (HCC) Active Problems:   Atrial fibrillation, chronic (HCC)   Essential hypertension   HLD (hyperlipidemia)   GAD (generalized anxiety disorder)   Rheumatoid arthritis (HCC)    Assessment and Plan: 89 year old female with history of recent DVT on Eliquis , atrial fibrillation, HTN, HLD, GAD, remote arthritis who came in from the facility for right lower extremity pain.  1.  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  EDP discussed with vascular team they advised for thrombectomy.  Patient declined those procedures.  EDP consulted hematology team.  Dr. Babara advised for heparin  drip  transitioning to Coumadin. - Patient has been started on heparin  drip and transition to Coumadin likely tomorrow. - Vascular surgery and hematology input appreciated - Pharmacy to dose heparin  drip per protocol  2.  Atrial fibrillation - Rate is well-controlled - Continue diltiazem  at home dose - Continue heparin  as mentioned above - Monitoring telemetry  3.  HTN/HLD/GAD - Resume home medications  4.  Rheumatoid arthritis - Continue folic acid, prednisone and pain medications  5.  Continue albuterol  at home dose for shortness of breath  6.  AKI on CKD - Creatinine slightly worse than baseline - Continue to monitor kidney function    DVT prophylaxis: IV heparin  gtts Code Status: DNR/DNI(Do NOT Intubate) Family Communication: Patient's daughter was at bedside Disposition Plan: Back to facility Consults called: Hematology/vascular surgery Admission status: Inpatient, Telemetry bed   Nena Rebel, MD [facility] 02/07/2024, 5:42 PM

## 2024-02-08 DIAGNOSIS — M05771 Rheumatoid arthritis with rheumatoid factor of right ankle and foot without organ or systems involvement: Secondary | ICD-10-CM

## 2024-02-08 DIAGNOSIS — F411 Generalized anxiety disorder: Secondary | ICD-10-CM

## 2024-02-08 DIAGNOSIS — I482 Chronic atrial fibrillation, unspecified: Secondary | ICD-10-CM

## 2024-02-08 DIAGNOSIS — N179 Acute kidney failure, unspecified: Secondary | ICD-10-CM

## 2024-02-08 DIAGNOSIS — N189 Chronic kidney disease, unspecified: Secondary | ICD-10-CM

## 2024-02-08 DIAGNOSIS — E785 Hyperlipidemia, unspecified: Secondary | ICD-10-CM | POA: Diagnosis not present

## 2024-02-08 DIAGNOSIS — I82401 Acute embolism and thrombosis of unspecified deep veins of right lower extremity: Secondary | ICD-10-CM | POA: Diagnosis not present

## 2024-02-08 DIAGNOSIS — L8990 Pressure ulcer of unspecified site, unspecified stage: Secondary | ICD-10-CM | POA: Insufficient documentation

## 2024-02-08 DIAGNOSIS — I10 Essential (primary) hypertension: Secondary | ICD-10-CM | POA: Diagnosis not present

## 2024-02-08 DIAGNOSIS — L89312 Pressure ulcer of right buttock, stage 2: Secondary | ICD-10-CM

## 2024-02-08 LAB — COMPREHENSIVE METABOLIC PANEL WITH GFR
ALT: 9 U/L (ref 0–44)
AST: 10 U/L — ABNORMAL LOW (ref 15–41)
Albumin: 2.9 g/dL — ABNORMAL LOW (ref 3.5–5.0)
Alkaline Phosphatase: 78 U/L (ref 38–126)
Anion gap: 9 (ref 5–15)
BUN: 28 mg/dL — ABNORMAL HIGH (ref 8–23)
CO2: 23 mmol/L (ref 22–32)
Calcium: 9.4 mg/dL (ref 8.9–10.3)
Chloride: 107 mmol/L (ref 98–111)
Creatinine, Ser: 1.35 mg/dL — ABNORMAL HIGH (ref 0.44–1.00)
GFR, Estimated: 38 mL/min — ABNORMAL LOW (ref >60)
Glucose, Bld: 143 mg/dL — ABNORMAL HIGH (ref 70–99)
Potassium: 4.2 mmol/L (ref 3.5–5.1)
Sodium: 139 mmol/L (ref 135–145)
Total Bilirubin: 0.6 mg/dL (ref 0.0–1.2)
Total Protein: 6.2 g/dL — ABNORMAL LOW (ref 6.5–8.1)

## 2024-02-08 LAB — APTT
aPTT: 84 s — ABNORMAL HIGH (ref 24–36)
aPTT: 64 s — ABNORMAL HIGH (ref 24–36)
aPTT: 58 s — ABNORMAL HIGH (ref 24–36)

## 2024-02-08 LAB — CBC
HCT: 33.3 % — ABNORMAL LOW (ref 36.0–46.0)
Hemoglobin: 10.7 g/dL — ABNORMAL LOW (ref 12.0–15.0)
MCH: 31.9 pg (ref 26.0–34.0)
MCHC: 32.1 g/dL (ref 30.0–36.0)
MCV: 99.4 fL (ref 80.0–100.0)
Platelets: 209 K/uL (ref 150–400)
RBC: 3.35 MIL/uL — ABNORMAL LOW (ref 3.87–5.11)
RDW: 13.1 % (ref 11.5–15.5)
WBC: 5.8 K/uL (ref 4.0–10.5)
nRBC: 0 % (ref 0.0–0.2)

## 2024-02-08 LAB — PROTIME-INR
INR: 1.6 — ABNORMAL HIGH (ref 0.8–1.2)
Prothrombin Time: 19.5 s — ABNORMAL HIGH (ref 11.4–15.2)

## 2024-02-08 LAB — HEPARIN LEVEL (UNFRACTIONATED): Heparin Unfractionated: >1.1 [IU]/mL — ABNORMAL HIGH (ref 0.30–0.70)

## 2024-02-08 MED ORDER — WARFARIN SODIUM 2.5 MG PO TABS
2.5000 mg | ORAL_TABLET | Freq: Once | ORAL | Status: AC
  Administered 2024-02-08: 2.5 mg via ORAL
  Filled 2024-02-08: qty 1

## 2024-02-08 MED ORDER — WARFARIN - PHARMACIST DOSING INPATIENT: Freq: Every day | Status: DC

## 2024-02-08 MED ORDER — HEPARIN BOLUS VIA INFUSION
1050.0000 [IU] | Freq: Once | INTRAVENOUS | Status: AC
  Administered 2024-02-08: 1050 [IU] via INTRAVENOUS
  Filled 2024-02-08: qty 1050

## 2024-02-08 NOTE — Assessment & Plan Note (Signed)
 AKI on CKD stage IIIb.  Creatinine 1.71 on presentation down to 1.35.

## 2024-02-08 NOTE — Assessment & Plan Note (Signed)
 Patient on heparin  drip.  This is an Eliquis  failure.  Continue Coumadin.  Pharmacy dosing Coumadin.  Patient initially declined thrombectomy.  INR still low at 1.3.  Pharmacy increase Coumadin to 6 mg for today.

## 2024-02-08 NOTE — Progress Notes (Signed)
 Notified that patient had 2 pauses of 2.4 seconds.  Since the patient has chronic atrial fibrillation we do not do anything with the pauses unless they are greater than 5 seconds.  Can continue anticoagulation and Cardizem  CD.  Dr. Charlie Patterson

## 2024-02-08 NOTE — Assessment & Plan Note (Signed)
On Cardizem 

## 2024-02-08 NOTE — Assessment & Plan Note (Signed)
 On Zoloft.

## 2024-02-08 NOTE — Consult Note (Signed)
 Pharmacy Consult Note - Anticoagulation  Pharmacy Consult for warfarin with heparin  bridge Indication: DVT  PATIENT MEASUREMENTS: Height: 5' 5 (165.1 cm) Weight: 72.7 kg (160 lb 4.4 oz) IBW/kg (Calculated) : 57 HEPARIN  DW (KG): 71.7  VITAL SIGNS: Temp: 98 F (36.7 C) (10/11 0802) Temp Source: Oral (10/11 0802) BP: 114/81 (10/11 0802) Pulse Rate: 101 (10/11 0802)  Recent Labs    02/08/24 0251 02/08/24 0707 02/08/24 0959  HGB 10.7*  --   --   HCT 33.3*  --   --   PLT 209  --   --   APTT  --  84* 64*  LABPROT 19.5*  --   --   INR 1.6*  --   --   HEPARINUNFRC  --  >1.10*  --   CREATININE 1.35*  --   --     Estimated Creatinine Clearance: 29.3 mL/min (A) (by C-G formula based on SCr of 1.35 mg/dL (H)).  PAST MEDICAL HISTORY: Past Medical History:  Diagnosis Date   Anemia    past hx   Arthritis    hands and feet   Chronic kidney disease    COPD (chronic obstructive pulmonary disease) (HCC)    Dyspnea    Dysrhythmia    Pt reports slow HR and papatations   Heart murmur    past hx   Hypercholesteremia    Hypertension    Rheumatic fever    (x2) in past   Wears dentures    full upper    ASSESSMENT: 89 y.o. female with PMH including recent DVT diagnosis is presenting with worsening DVT. She had been prescribed Eliquis  for DVT diagnosed 01/03/2024 but per discussions between RN and EDP, her facility had not been giving it regularly -- last dose unknown. Pharmacy has been consulted to initiate and manage heparin  intravenous infusion.   10/11: Per Dr. Josette, team is considering this an apixaban  failure and are transitioning patient to warfarin. INR goal 2-3. Heparin  continued as a bridge.  Heparin  Assessment: 10/10:  Heparin  gtt and bolus given @ 1524 10/10: HL @ 1536 = > 1.10 (not true baseline b/c drawn after heparin  started) 10/10: HL @ 1753 = > 1.10 10/10: aPTT @ 1923 = 185, elevated  10/11: 0707 aPTT: 84 HL > 1.1  Warfarin Assessment: Date INR Warfarin  Dose  10/11 1.6 2.5 mg (ordered)         Pertinent medications: Has not been taking Eliquis  as prescribed  Goal(s) of therapy: Heparin  level 0.3 - 0.7 units/mL aPTT 66 - 102 seconds INR 2 - 3 Monitor platelets by anticoagulation protocol: Yes   Baseline anticoagulation labs: Recent Labs    02/07/24 0948 02/07/24 1536 02/07/24 1923 02/08/24 0251 02/08/24 0707 02/08/24 0959  APTT  --    < > 185*  --  84* 64*  INR 1.7*  --   --  1.6*  --   --   HGB 12.0  --   --  10.7*  --   --   PLT 242  --   --  209  --   --    < > = values in this interval not displayed.  Baseline heparin   ordered Will order baseline anti-Xa level to assess for false elevation.   Heparin  PLAN: aPTT is therapeutic. Heparin  level seems to be falsely elevated. Will continue heparin  infusion at 950 units/hr. Recheck aPTT in 8 hours from last level. Monitor heparin  level daily until correlating with aPTT. CBC daily while on heparin . Discontinue  heparin  when INR in therapeutic.  Warfarin PLAN: INR is subtherapeutic at 1.6. Give warfarin 2.5 mg PO x 1. Monitor INR daily.    Lum VEAR Mania, PharmD, BCPS Clinical Pharmacist 02/08/2024 1:18 PM

## 2024-02-08 NOTE — Assessment & Plan Note (Signed)
 On chronic prednisone.  Patient with right ankle pain.  Will check a uric acid.

## 2024-02-08 NOTE — Hospital Course (Signed)
 Hospital course / significant events:   HPI: 89 y.o. female with medical history significant for A-fib on Eliquis , HTN, CHF, COPD, DVT on Eliquis  who presented to ED for evaluation of bilateral leg pain.  Patient was diagnosed with DVT recently 01/03/24 on right lower extremity --> increased eliwuis, dc, f/u w/ vascular 10/01 no concerns plan follow-up again in 3 months with venous ultrasound. Patient again to ED 10/10 not able to walk due to pain RLE. Of note, had x-ray done at her facility which demonstrated a bone lesion in femur (benign per ortho no f/u needed).    10/10: to ED, lower extremity ultrasound showed an extension of the previous nonocclusive DVT in her right lower extremity to right common femoral vein. Vascular surgery was consulted in the ED - recs thrombectomy, was declined by pt/fam at that time. EDP discussed with hematology Dr. Babara who advised start heparin  drip and transition to Coumadin.  Admitted to hospitalist. Concern also for acute gout R ankle  10/11: Patient stated initially she had pain in her right ankle and when she was given prednisone that have improved.  Now considering for thrombectomy, hospitalist spoke with vascular surgeon on-call covering for the weekend and recommended medical management with anticoagulation. 10/12: Still having pain in right ankle.  10/13: patient having some confusion.  Decrease prednisone down to 20 mg today.  Patient ankle pain is better today than it was yesterday. 10/14: wanted to go back up on her prednisone to 40 mg daily for right ankle pain.  Will readjust the dose of colchicine.  INR still low at 1.3. Qualifying for rehab but pt would like to go back to ALF 10/15: ALF will not takeher back without rehab, will work on this. INR still subtherapeutic, remain on heparin        Consultants:  Vascular surgery  Informal d/w HemOnc and Ortho as above   Procedures/Surgeries: none      ASSESSMENT & PLAN:   Acute DVT (deep venous  thrombosis)  Subtherapeutic INR  This is an Eliquis  failure Patient on heparin  drip Coumadin. Pharmacy dosing Coumadin.   Would check w/ hematology if ok to go on lovenox  bridge if SNF approved + coumadin still subtherapeutic    Acute gout Right ankle.   prednisone to 40 mg daily (chronically takes 5mg ).   Status post dose of colchicine on 10/12 and 10/14.   Low-dose allopurinol started.   Acute metabolic encephalopathy Mental status improved today as compared to yesterday.   Continue give melatonin at night.   Acute kidney injury superimposed on CKD AKI on CKD stage IIIb.  Creatinine 1.71 on presentation down to 1.35.  Last creatinine 1.43 with a GFR 35 Monitor BMP   Atrial fibrillation, chronic  Patient had sinus pauses up to 2.4 seconds. Currently on heparin  drip and pharmacy dosing Coumadin patient on diltiazem . chronic atrial fibrillation we do not do anything with pauses that are less than 5 seconds.     Essential hypertension On Cardizem    HLD (hyperlipidemia) On Zocor    Pressure injury of skin Wound 02/07/24 2030 Pressure Injury Buttocks Right Stage 2 -  Partial thickness loss of dermis presenting as a shallow open injury with a red, pink wound bed without slough. (Active)  Present on admission   Rheumatoid arthritis  On chronic prednisone.     GAD (generalized anxiety disorder) On Zoloft    overweight based on BMI: Body mass index is 26.67 kg/m.SABRA Significantly low or high BMI is associated with higher medical  risk.  Underweight - under 18  overweight - 25 to 29 obese - 30 or more Class 1 obesity: BMI of 30.0 to 34 Class 2 obesity: BMI of 35.0 to 39 Class 3 obesity: BMI of 40.0 to 49 Super Morbid Obesity: BMI 50-59 Super-super Morbid Obesity: BMI 60+ Healthy nutrition and physical activity advised as adjunct to other disease management and risk reduction treatments    DVT prophylaxis: heparin   IV fluids: no continuous IV fluids  Nutrition:  regular Central lines / other devices: none  Code Status: DNR ACP documentation reviewed: none on file in [facility]  [facility] needs: SNF rehab Medical barriers to dispo: INR. Expected medical readiness for discharge once therapeutic INR / if ok for lovenox  bridge .

## 2024-02-08 NOTE — Plan of Care (Signed)
  Problem: Education: Goal: Knowledge of General Education information will improve Description: Including pain rating scale, medication(s)/side effects and non-pharmacologic comfort measures Outcome: Progressing   Problem: Clinical Measurements: Goal: Diagnostic test results will improve Outcome: Progressing   Problem: Coping: Goal: Level of anxiety will decrease Outcome: Progressing   Problem: Skin Integrity: Goal: Risk for impaired skin integrity will decrease Outcome: Progressing

## 2024-02-08 NOTE — Progress Notes (Signed)
 Progress Note   Patient: Roberta Ross FMW:[id number] DOB: Mar 21, 1934 DOA: 02/07/2024     1 DOS: the patient was seen and examined on 02/08/2024   Brief hospital course: 89 y.o. female with medical history significant for A-fib on Eliquis , HTN, CHF, COPD, DVT on Eliquis  who presented to ED for evaluation of bilateral leg pain.  Patient was diagnosed with DVT recently on right lower extremity.  She was started on Eliquis  10 mg twice daily for 7 days then reduce to 5 mg twice daily indefinitely.  She followed up in vein and vascular clinic without any symptoms to follow-up again in 3 months with venous ultrasound.  Patient reported that she started having pain on her right leg which has worsened and she was not able to walk due to pain.  She had x-ray done at her facility which demonstrated a bone lesion in her femur.  EDP discussed with orthopedic team and they advised that this is a benign lesion and no further workup needed at this point.  Patient was in the ER on 9/5 for right leg pain with that time ultrasound demonstrated bilateral nonocclusive DVTs.  But today lower extremity ultrasound showed an extension of the previous nonocclusive DVT in her right lower extremity to right common femoral vein.     ED Course: Upon arrival to the ED, patient is found to be in severe pain of right lower extremity.  She was found to have worsening DVT on right lower extremity then 1 month prior.  Vascular surgery was consulted in the ED who advised that patient may benefit from thrombectomy.  Vascular surgery came and evaluated patient and patient did not want to pursue any procedures.  ED discussed with hematology Dr. Babara who advised that patient failed outpatient Eliquis  and now she needs to be on Coumadin.  She advised to start patient on heparin  drip and transition to Coumadin.  Hospitalist service was consulted for evaluation for admission.  10/11.  Patient stated initially she had pain in her right  ankle and when she was given prednisone that have improved.  Found to have extensive DVT and was admitted for heparin  drip to switch over to Coumadin.  Patient and family initially declined thrombectomy but now were considering.  I spoke with vascular surgeon on-call covering for the weekend and recommended medical management with anticoagulation.  Assessment and Plan: * Acute DVT (deep venous thrombosis) (HCC) Patient on heparin  drip.  Could be Eliquis  failure.  Will start Coumadin.  Patient initially declined thrombectomy.  Covering vascular surgeon over the weekend recommended medical management.  Acute kidney injury superimposed on CKD AKI on CKD stage IIIb.  Creatinine 1.71 on presentation down to 1.35.  Atrial fibrillation, chronic (HCC) Currently on heparin  drip and will start Coumadin.  Patient on diltiazem .  Essential hypertension On Cardizem   HLD (hyperlipidemia) On Zocor   Pressure injury of skin Wound 02/07/24 2030 Pressure Injury Buttocks Right Stage 2 -  Partial thickness loss of dermis presenting as a shallow open injury with a red, pink wound bed without slough. (Active)      Rheumatoid arthritis (HCC) On chronic prednisone.  Patient with right ankle pain.  Will check a uric acid.  GAD (generalized anxiety disorder) On Zoloft    Subjective: Patient complains of right ankle pain.  Came in with worse DVT.  Has been on Eliquis .  Initially declined thrombectomy.  Physical Exam: Vitals:   02/07/24 1901 02/08/24 0424 02/08/24 0802 02/08/24 1540  BP: 117/76 97/62 114/81 102/66  Pulse: 96 67 (!) 101 73  Resp: 17 17 16 17   Temp: 97.9 F (36.6 C)  98 F (36.7 C) 97.9 F (36.6 C)  TempSrc:   Oral Oral  SpO2: 95% 97% 95% 95%  Weight:      Height:       Physical Exam HENT:     Head: Normocephalic.     Mouth/Throat:     Pharynx: No oropharyngeal exudate.  Eyes:     General: Lids are normal.     Conjunctiva/sclera: Conjunctivae normal.  Cardiovascular:      Rate and Rhythm: Normal rate and regular rhythm.     Heart sounds: Normal heart sounds, S1 normal and S2 normal.  Pulmonary:     Breath sounds: No decreased breath sounds, wheezing, rhonchi or rales.  Abdominal:     Palpations: Abdomen is soft.     Tenderness: There is no abdominal tenderness.  Musculoskeletal:     Right lower leg: No swelling.     Left lower leg: No swelling.     Right ankle: Tenderness present.  Skin:    General: Skin is warm.     Findings: No rash.  Neurological:     Mental Status: She is alert and oriented to person, place, and time.     Data Reviewed: Creatinine 1.35, hemoglobin 10.7, platelet count 209, white blood count 5.8  Family Communication: Daughter at bedside and son on the phone  Disposition: Status is: Inpatient Remains inpatient appropriate because: Starting Coumadin.  On heparin  drip.  Planned Discharge Destination: Home    Time spent: 30 minutes  Author: Charlie Patterson, MD 02/08/2024 4:26 PM  For on call review [email or web address].

## 2024-02-08 NOTE — Consult Note (Signed)
 Pharmacy Consult Note - Anticoagulation  Pharmacy Consult for heparin  Indication: DVT  PATIENT MEASUREMENTS: Height: 5' 5 (165.1 cm) Weight: 72.7 kg (160 lb 4.4 oz) IBW/kg (Calculated) : 57 HEPARIN  DW (KG): 71.7  VITAL SIGNS: BP: 97/62 (10/11 0424) Pulse Rate: 67 (10/11 0424)  Recent Labs    02/08/24 0251 02/08/24 0707  HGB 10.7*  --   HCT 33.3*  --   PLT 209  --   APTT  --  84*  LABPROT 19.5*  --   INR 1.6*  --   HEPARINUNFRC  --  >1.10*  CREATININE 1.35*  --     Estimated Creatinine Clearance: 29.3 mL/min (A) (by C-G formula based on SCr of 1.35 mg/dL (H)).  PAST MEDICAL HISTORY: Past Medical History:  Diagnosis Date   Anemia    past hx   Arthritis    hands and feet   Chronic kidney disease    COPD (chronic obstructive pulmonary disease) (HCC)    Dyspnea    Dysrhythmia    Pt reports slow HR and papatations   Heart murmur    past hx   Hypercholesteremia    Hypertension    Rheumatic fever    (x2) in past   Wears dentures    full upper    ASSESSMENT: 89 y.o. female with PMH including recent DVT diagnosis is presenting with worsening DVT. She had been prescribed Eliquis  for DVT diagnosed 01/03/2024 but per discussions between RN and EDP, her facility had not been giving it regularly -- last dose unknown. Pharmacy has been consulted to initiate and manage heparin  intravenous infusion.  10/10:  Heparin  gtt and bolus given @ 1524 10/10: HL @ 1536 = > 1.10 (not true baseline b/c drawn after heparin  started) 10/10: HL @ 1753 = > 1.10 10/10: aPTT @ 1923 = 185, elevated  10/11: 0707 aPTT: 84 HL > 1.1   Pertinent medications: Has not been taking Eliquis  as prescribed  Goal(s) of therapy: Heparin  level 0.3 - 0.7 units/mL Monitor platelets by anticoagulation protocol: Yes   Baseline anticoagulation labs: Recent Labs    02/07/24 0948 02/07/24 1536 02/07/24 1923 02/08/24 0251 02/08/24 0707  APTT  --  >200* 185*  --  84*  INR 1.7*  --   --  1.6*  --    HGB 12.0  --   --  10.7*  --   PLT 242  --   --  209  --   Baseline heparin   ordered Will order baseline anti-Xa level to assess for false elevation.   PLAN: aPTT is therapeutic. Heparin  level seems to be falsely elevated. Will continue heparin  infusion at 950 units/hr. Recheck heparin  level in 8 hours. CBC daily while on heparin .   [name], PharmD, BCPS Clinical Pharmacist 02/08/2024 7:53 AM

## 2024-02-08 NOTE — Assessment & Plan Note (Signed)
 Wound 02/07/24 2030 Pressure Injury Buttocks Right Stage 2 -  Partial thickness loss of dermis presenting as a shallow open injury with a red, pink wound bed without slough. (Active)   Present on admission

## 2024-02-08 NOTE — Consult Note (Signed)
 Pharmacy Consult Note - Anticoagulation  Pharmacy Consult for warfarin with heparin  bridge Indication: DVT  PATIENT MEASUREMENTS: Height: 5' 5 (165.1 cm) Weight: 72.7 kg (160 lb 4.4 oz) IBW/kg (Calculated) : 57 HEPARIN  DW (KG): 71.7  VITAL SIGNS: Temp: 97.9 F (36.6 C) (10/11 1540) Temp Source: Oral (10/11 1540) BP: 102/66 (10/11 1540) Pulse Rate: 73 (10/11 1540)  Recent Labs    02/08/24 0251 02/08/24 0707 02/08/24 0959 02/08/24 1642  HGB 10.7*  --   --   --   HCT 33.3*  --   --   --   PLT 209  --   --   --   APTT  --  84*   < > 58*  LABPROT 19.5*  --   --   --   INR 1.6*  --   --   --   HEPARINUNFRC  --  >1.10*  --   --   CREATININE 1.35*  --   --   --    < > = values in this interval not displayed.    Estimated Creatinine Clearance: 29.3 mL/min (A) (by C-G formula based on SCr of 1.35 mg/dL (H)).  PAST MEDICAL HISTORY: Past Medical History:  Diagnosis Date   Anemia    past hx   Arthritis    hands and feet   Chronic kidney disease    COPD (chronic obstructive pulmonary disease) (HCC)    Dyspnea    Dysrhythmia    Pt reports slow HR and papatations   Heart murmur    past hx   Hypercholesteremia    Hypertension    Rheumatic fever    (x2) in past   Wears dentures    full upper    ASSESSMENT: 89 y.o. female with PMH including recent DVT diagnosis is presenting with worsening DVT. She had been prescribed Eliquis  for DVT diagnosed 01/03/2024 but per discussions between RN and EDP, her facility had not been giving it regularly -- last dose unknown. Pharmacy has been consulted to initiate and manage heparin  intravenous infusion.   10/11: Per Dr. Josette, team is considering this an apixaban  failure and are transitioning patient to warfarin. INR goal 2-3. Heparin  continued as a bridge.  Heparin  Assessment: 10/10:  Heparin  gtt and bolus given @ 1524 10/10: HL @ 1536 = > 1.10 (not true baseline b/c drawn after heparin  started) 10/10: HL @ 1753 = > 1.10 10/10:  aPTT @ 1923 = 185, elevated  10/11: 0707 aPTT: 84 HL > 1.1 10/11: 0959 aPTT: 64- drawn too early 10/11: 1642 aPTT: 58, subtherapeutic   Warfarin Assessment: Date INR Warfarin Dose  10/11 1.6 2.5 mg (ordered)         Pertinent medications: Has not been taking Eliquis  as prescribed  Goal(s) of therapy: Heparin  level 0.3 - 0.7 units/mL aPTT 66 - 102 seconds INR 2 - 3 Monitor platelets by anticoagulation protocol: Yes   Baseline anticoagulation labs: Recent Labs    02/07/24 0948 02/07/24 1536 02/08/24 0251 02/08/24 0707 02/08/24 0959 02/08/24 1642  APTT  --    < >  --  84* 64* 58*  INR 1.7*  --  1.6*  --   --   --   HGB 12.0  --  10.7*  --   --   --   PLT 242  --  209  --   --   --    < > = values in this interval not displayed.  Baseline heparin   ordered Will order  baseline anti-Xa level to assess for false elevation.   Heparin  PLAN: aPTT is subtherapeutic. Heparin  level seems to be falsely elevated. Give 1050 unit bolus x 1 and increase heparin  infusion rate to 1100 units/hr. Recheck aPTT in 8 hours. Monitor heparin  level daily until correlating with aPTT. CBC daily while on heparin . Discontinue heparin  when INR in therapeutic.  Warfarin PLAN: INR is subtherapeutic at 1.6. Give warfarin 2.5 mg PO x 1. Monitor INR daily.    Lum VEAR Mania, PharmD, BCPS Clinical Pharmacist 02/08/2024 5:42 PM

## 2024-02-08 NOTE — Assessment & Plan Note (Signed)
 Currently on heparin  drip and will start Coumadin.  Patient on diltiazem .

## 2024-02-08 NOTE — Assessment & Plan Note (Signed)
On Zocor 

## 2024-02-09 DIAGNOSIS — I82401 Acute embolism and thrombosis of unspecified deep veins of right lower extremity: Secondary | ICD-10-CM | POA: Diagnosis not present

## 2024-02-09 DIAGNOSIS — M109 Gout, unspecified: Secondary | ICD-10-CM

## 2024-02-09 DIAGNOSIS — M10071 Idiopathic gout, right ankle and foot: Secondary | ICD-10-CM

## 2024-02-09 DIAGNOSIS — N179 Acute kidney failure, unspecified: Secondary | ICD-10-CM | POA: Diagnosis not present

## 2024-02-09 DIAGNOSIS — I82411 Acute embolism and thrombosis of right femoral vein: Secondary | ICD-10-CM | POA: Diagnosis not present

## 2024-02-09 DIAGNOSIS — I482 Chronic atrial fibrillation, unspecified: Secondary | ICD-10-CM | POA: Diagnosis not present

## 2024-02-09 LAB — CBC
HCT: 34.2 % — ABNORMAL LOW (ref 36.0–46.0)
Hemoglobin: 10.9 g/dL — ABNORMAL LOW (ref 12.0–15.0)
MCH: 31.7 pg (ref 26.0–34.0)
MCHC: 31.9 g/dL (ref 30.0–36.0)
MCV: 99.4 fL (ref 80.0–100.0)
Platelets: 237 K/uL (ref 150–400)
RBC: 3.44 MIL/uL — ABNORMAL LOW (ref 3.87–5.11)
RDW: 13.1 % (ref 11.5–15.5)
WBC: 6.4 K/uL (ref 4.0–10.5)
nRBC: 0 % (ref 0.0–0.2)

## 2024-02-09 LAB — APTT
aPTT: 68 s — ABNORMAL HIGH (ref 24–36)
aPTT: 75 s — ABNORMAL HIGH (ref 24–36)

## 2024-02-09 LAB — HEPARIN LEVEL (UNFRACTIONATED): Heparin Unfractionated: >1.1 [IU]/mL — ABNORMAL HIGH (ref 0.30–0.70)

## 2024-02-09 LAB — PROTIME-INR
INR: 1.3 — ABNORMAL HIGH (ref 0.8–1.2)
Prothrombin Time: 16.7 s — ABNORMAL HIGH (ref 11.4–15.2)

## 2024-02-09 LAB — URIC ACID: Uric Acid, Serum: 10.3 mg/dL — ABNORMAL HIGH (ref 2.5–7.1)

## 2024-02-09 LAB — CREATININE, SERUM
Creatinine, Ser: 1.4 mg/dL — ABNORMAL HIGH (ref 0.44–1.00)
GFR, Estimated: 36 mL/min — ABNORMAL LOW (ref >60)

## 2024-02-09 MED ORDER — COLCHICINE 0.6 MG PO TABS
0.6000 mg | ORAL_TABLET | Freq: Once | ORAL | Status: AC
  Administered 2024-02-09: 0.6 mg via ORAL
  Filled 2024-02-09: qty 1

## 2024-02-09 MED ORDER — PREDNISONE 20 MG PO TABS
40.0000 mg | ORAL_TABLET | Freq: Every day | ORAL | Status: DC
  Administered 2024-02-09: 40 mg via ORAL
  Administered 2024-02-10: 20 mg via ORAL
  Filled 2024-02-09 (×2): qty 2

## 2024-02-09 MED ORDER — WARFARIN SODIUM 3 MG PO TABS
3.0000 mg | ORAL_TABLET | Freq: Once | ORAL | Status: AC
  Administered 2024-02-09: 3 mg via ORAL
  Filled 2024-02-09: qty 1

## 2024-02-09 MED ORDER — HEPARIN BOLUS VIA INFUSION
1050.0000 [IU] | Freq: Once | INTRAVENOUS | Status: AC
  Administered 2024-02-09: 1050 [IU] via INTRAVENOUS
  Filled 2024-02-09: qty 1050

## 2024-02-09 NOTE — Plan of Care (Signed)
  Problem: Safety: Goal: Ability to remain free from injury will improve Outcome: Progressing   Problem: Pain Managment: Goal: General experience of comfort will improve and/or be controlled Outcome: Progressing   Problem: Coping: Goal: Level of anxiety will decrease Outcome: Progressing   Problem: Activity: Goal: Risk for activity intolerance will decrease Outcome: Progressing   Problem: Education: Goal: Knowledge of General Education information will improve Description: Including pain rating scale, medication(s)/side effects and non-pharmacologic comfort measures Outcome: Progressing

## 2024-02-09 NOTE — Progress Notes (Signed)
 Progress Note   Patient: Roberta Ross FMW:[id number] DOB: 1933/06/28 DOA: 02/07/2024     2 DOS: the patient was seen and examined on 02/09/2024   Brief hospital course: 89 y.o. female with medical history significant for A-fib on Eliquis , HTN, CHF, COPD, DVT on Eliquis  who presented to ED for evaluation of bilateral leg pain.  Patient was diagnosed with DVT recently on right lower extremity.  She was started on Eliquis  10 mg twice daily for 7 days then reduce to 5 mg twice daily indefinitely.  She followed up in vein and vascular clinic without any symptoms to follow-up again in 3 months with venous ultrasound.  Patient reported that she started having pain on her right leg which has worsened and she was not able to walk due to pain.  She had x-ray done at her facility which demonstrated a bone lesion in her femur.  EDP discussed with orthopedic team and they advised that this is a benign lesion and no further workup needed at this point.  Patient was in the ER on 9/5 for right leg pain with that time ultrasound demonstrated bilateral nonocclusive DVTs.  But today lower extremity ultrasound showed an extension of the previous nonocclusive DVT in her right lower extremity to right common femoral vein.     ED Course: Upon arrival to the ED, patient is found to be in severe pain of right lower extremity.  She was found to have worsening DVT on right lower extremity then 1 month prior.  Vascular surgery was consulted in the ED who advised that patient may benefit from thrombectomy.  Vascular surgery came and evaluated patient and patient did not want to pursue any procedures.  ED discussed with hematology Dr. Babara who advised that patient failed outpatient Eliquis  and now she needs to be on Coumadin.  She advised to start patient on heparin  drip and transition to Coumadin.  Hospitalist service was consulted for evaluation for admission.  10/11.  Patient stated initially she had pain in her right  ankle and when she was given prednisone that have improved.  Found to have extensive DVT and was admitted for heparin  drip to switch over to Coumadin.  Patient and family initially declined thrombectomy but now were considering.  I spoke with vascular surgeon on-call covering for the weekend and recommended medical management with anticoagulation. 10/12.  Still having pain in right ankle.   Assessment and Plan: * Acute DVT (deep venous thrombosis) (HCC) Patient on heparin  drip.  Could be Eliquis  failure.  Continue Coumadin.  Patient initially declined thrombectomy.  Covering vascular surgeon over the weekend recommended medical management.  Acute gout Right ankle.  Increase dose of prednisone to 40 mg daily.  Will give a dose of colchicine.  Acute kidney injury superimposed on CKD AKI on CKD stage IIIb.  Creatinine 1.71 on presentation down to 1.35.  Today's creatinine 1.4 with a GFR 36  Atrial fibrillation, chronic (HCC) Currently on heparin  drip and will start Coumadin.  Patient on diltiazem .  Patient had sinus pauses up to 2.4 seconds.  With chronic atrial fibrillation we do not do anything with pauses that are less than 5 seconds.  Essential hypertension On Cardizem   HLD (hyperlipidemia) On Zocor   Pressure injury of skin Wound 02/07/24 2030 Pressure Injury Buttocks Right Stage 2 -  Partial thickness loss of dermis presenting as a shallow open injury with a red, pink wound bed without slough. (Active)      Rheumatoid arthritis (HCC) On chronic prednisone.  GAD (generalized anxiety disorder) On Zoloft        Subjective: Patient still having some pain in right ankle.  Uric acid elevated.  Could be gout or rheumatoid flare.  Increase prednisone.  On heparin  drip and started Coumadin  Physical Exam: Vitals:   02/08/24 1540 02/08/24 2022 02/09/24 0333 02/09/24 0736  BP: 102/66 (!) 103/58 98/64 128/71  Pulse: 73 (!) 56 80 87  Resp: 17 18 16 17   Temp: 97.9 F (36.6 C)  98.3 F (36.8 C) 98.2 F (36.8 C) 98.6 F (37 C)  TempSrc: Oral   Oral  SpO2: 95% 96% 96% 99%  Weight:      Height:       Physical Exam HENT:     Head: Normocephalic.     Mouth/Throat:     Pharynx: No oropharyngeal exudate.  Eyes:     General: Lids are normal.     Conjunctiva/sclera: Conjunctivae normal.  Cardiovascular:     Rate and Rhythm: Normal rate and regular rhythm.     Heart sounds: Normal heart sounds, S1 normal and S2 normal.  Pulmonary:     Breath sounds: No decreased breath sounds, wheezing, rhonchi or rales.  Abdominal:     Palpations: Abdomen is soft.     Tenderness: There is no abdominal tenderness.  Musculoskeletal:     Right lower leg: No swelling.     Left lower leg: No swelling.     Right ankle: Tenderness present.  Skin:    General: Skin is warm.     Findings: No rash.  Neurological:     Mental Status: She is alert and oriented to person, place, and time.     Data Reviewed: Creatinine 1.4 with a GFR 36, uric acid 10.3, white blood count 6.4, hemoglobin 10.9, platelet count 237 Family Communication: Spoke with daughter on the phone  Disposition: Status is: Inpatient Remains inpatient appropriate because: Treating DVT with Eliquis  failure.  On heparin  drip and dosing Coumadin.  Treating likely acute gout versus rheumatoid flare in right ankle.  Planned Discharge Destination: Home with Home Health    Time spent: 28 minutes  Author: Charlie Patterson, MD 02/09/2024 1:22 PM  For on call review [email or web address].

## 2024-02-09 NOTE — Consult Note (Signed)
 Pharmacy Consult Note - Anticoagulation  Pharmacy Consult for warfarin with heparin  bridge Indication: DVT  PATIENT MEASUREMENTS: Height: 5' 5 (165.1 cm) Weight: 72.7 kg (160 lb 4.4 oz) IBW/kg (Calculated) : 57 HEPARIN  DW (KG): 71.7  VITAL SIGNS: Temp: 98.6 F (37 C) (10/12 0736) Temp Source: Oral (10/12 0736) BP: 128/71 (10/12 0736) Pulse Rate: 87 (10/12 0736)  Recent Labs    02/09/24 0344 02/09/24 1426  HGB 10.9*  --   HCT 34.2*  --   PLT 237  --   APTT 68* 75*  LABPROT 16.7*  --   INR 1.3*  --   HEPARINUNFRC >1.10*  --   CREATININE 1.40*  --     Estimated Creatinine Clearance: 28.3 mL/min (A) (by C-G formula based on SCr of 1.4 mg/dL (H)).  PAST MEDICAL HISTORY: Past Medical History:  Diagnosis Date   Anemia    past hx   Arthritis    hands and feet   Chronic kidney disease    COPD (chronic obstructive pulmonary disease) (HCC)    Dyspnea    Dysrhythmia    Pt reports slow HR and papatations   Heart murmur    past hx   Hypercholesteremia    Hypertension    Rheumatic fever    (x2) in past   Wears dentures    full upper    ASSESSMENT: 89 y.o. female with PMH including recent DVT diagnosis is presenting with worsening DVT. She had been prescribed Eliquis  for DVT diagnosed 01/03/2024 but per discussions between RN and EDP, her facility had not been giving it regularly -- last dose unknown. Pharmacy has been consulted to initiate and manage heparin  intravenous infusion.   10/11: Per Dr. Josette, team is considering this an apixaban  failure and are transitioning patient to warfarin. INR goal 2-3. Heparin  continued as a bridge.  Heparin  Assessment: 10/10:  Heparin  gtt and bolus given @ 1524 10/10: HL @ 1536 = > 1.10 (not true baseline b/c drawn after heparin  started) 10/10: HL @ 1753 = > 1.10 10/10: aPTT @ 1923 = 185, elevated  10/11: 0707 aPTT: 84 HL > 1.1  Warfarin Assessment: Date INR Warfarin Dose  10/11 1.6 2.5 mg   10/12 1.3 3 mg      Pertinent  medications: Has not been taking Eliquis  as prescribed  Goal(s) of therapy: Heparin  level 0.3 - 0.7 units/mL aPTT 66 - 102 seconds INR 2 - 3 Monitor platelets by anticoagulation protocol: Yes   Baseline anticoagulation labs: Recent Labs    02/07/24 0948 02/07/24 1536 02/08/24 0251 02/08/24 0707 02/08/24 1642 02/09/24 0344 02/09/24 1426  APTT  --    < >  --    < > 58* 68* 75*  INR 1.7*  --  1.6*  --   --  1.3*  --   HGB 12.0  --  10.7*  --   --  10.9*  --   PLT 242  --  209  --   --  237  --    < > = values in this interval not displayed.  Baseline heparin   ordered Will order baseline anti-Xa level to assess for false elevation.   Heparin  PLAN: aPTT is therapeutic. Heparin  level seems to be falsely elevated. Will continue heparin  infusion at 950 units/hr. Recheck aPTT/heparin  level and CBC with AM labs. Monitor heparin  level daily until correlating with aPTT. CBC daily while on heparin . Discontinue heparin  when INR in therapeutic.  Warfarin PLAN: INR trending down. Will see how the  pt responses to the 2.5 mg dose with tomorrow's INR. Will give warfarin 3 mg x 1 tonight. Will dose slightly conservatively due to pt's age, low albumin, and baseline INR was > 1.4. Daily INR. CBC daily while on heparin .    [name], PharmD, BCPS Clinical Pharmacist 02/09/2024 2:43 PM

## 2024-02-09 NOTE — Consult Note (Signed)
 Pharmacy Consult Note - Anticoagulation  Pharmacy Consult for warfarin with heparin  bridge Indication: DVT  PATIENT MEASUREMENTS: Height: 5' 5 (165.1 cm) Weight: 72.7 kg (160 lb 4.4 oz) IBW/kg (Calculated) : 57 HEPARIN  DW (KG): 71.7  VITAL SIGNS: Temp: 98.6 F (37 C) (10/12 0736) Temp Source: Oral (10/12 0736) BP: 128/71 (10/12 0736) Pulse Rate: 87 (10/12 0736)  Recent Labs    02/09/24 0344  HGB 10.9*  HCT 34.2*  PLT 237  APTT 68*  LABPROT 16.7*  INR 1.3*  HEPARINUNFRC >1.10*  CREATININE 1.40*    Estimated Creatinine Clearance: 28.3 mL/min (A) (by C-G formula based on SCr of 1.4 mg/dL (H)).  PAST MEDICAL HISTORY: Past Medical History:  Diagnosis Date   Anemia    past hx   Arthritis    hands and feet   Chronic kidney disease    COPD (chronic obstructive pulmonary disease) (HCC)    Dyspnea    Dysrhythmia    Pt reports slow HR and papatations   Heart murmur    past hx   Hypercholesteremia    Hypertension    Rheumatic fever    (x2) in past   Wears dentures    full upper    ASSESSMENT: 89 y.o. female with PMH including recent DVT diagnosis is presenting with worsening DVT. She had been prescribed Eliquis  for DVT diagnosed 01/03/2024 but per discussions between RN and EDP, her facility had not been giving it regularly -- last dose unknown. Pharmacy has been consulted to initiate and manage heparin  intravenous infusion.   10/11: Per Dr. Josette, team is considering this an apixaban  failure and are transitioning patient to warfarin. INR goal 2-3. Heparin  continued as a bridge.  Heparin  Assessment: 10/10:  Heparin  gtt and bolus given @ 1524 10/10: HL @ 1536 = > 1.10 (not true baseline b/c drawn after heparin  started) 10/10: HL @ 1753 = > 1.10 10/10: aPTT @ 1923 = 185, elevated  10/11: 0707 aPTT: 84 HL > 1.1  Warfarin Assessment: Date INR Warfarin Dose  10/11 1.6 2.5 mg   10/12 1.3 3 mg      Pertinent medications: Has not been taking Eliquis  as  prescribed  Goal(s) of therapy: Heparin  level 0.3 - 0.7 units/mL aPTT 66 - 102 seconds INR 2 - 3 Monitor platelets by anticoagulation protocol: Yes   Baseline anticoagulation labs: Recent Labs    02/07/24 0948 02/07/24 1536 02/08/24 0251 02/08/24 0707 02/08/24 0959 02/08/24 1642 02/09/24 0344  APTT  --    < >  --    < > 64* 58* 68*  INR 1.7*  --  1.6*  --   --   --  1.3*  HGB 12.0  --  10.7*  --   --   --  10.9*  PLT 242  --  209  --   --   --  237   < > = values in this interval not displayed.  Baseline heparin   ordered Will order baseline anti-Xa level to assess for false elevation.   Heparin  PLAN: aPTT is therapeutic. Heparin  level seems to be falsely elevated. Will continue heparin  infusion at 950 units/hr. Recheck aPTT in 8 hours from last level. Monitor heparin  level daily until correlating with aPTT. CBC daily while on heparin . Discontinue heparin  when INR in therapeutic.  Warfarin PLAN: INR trending down. Will see how the pt responses to the 2.5 mg dose with tomorrow's INR. Will give warfarin 3 mg x 1 tonight. Will dose slightly conservatively due to  pt's age, low albumin, and baseline INR was > 1.4. Daily INR. CBC daily while on heparin .    Roberta Ross, PharmD, BCPS Clinical Pharmacist 02/09/2024 8:17 AM

## 2024-02-09 NOTE — Plan of Care (Signed)

## 2024-02-09 NOTE — Assessment & Plan Note (Signed)
 Right ankle.  Increase prednisone to 40 mg daily (chronically takes 5mg ).  Status post dose of colchicine on 10/12 and 10/14.  Low-dose allopurinol started.

## 2024-02-09 NOTE — Consult Note (Signed)
 Pharmacy Consult Note - Anticoagulation  Pharmacy Consult for warfarin with heparin  bridge Indication: DVT  PATIENT MEASUREMENTS: Height: 5' 5 (165.1 cm) Weight: 72.7 kg (160 lb 4.4 oz) IBW/kg (Calculated) : 57 HEPARIN  DW (KG): 71.7  VITAL SIGNS: Temp: 98.2 F (36.8 C) (10/12 0333) BP: 98/64 (10/12 0333) Pulse Rate: 80 (10/12 0333)  Recent Labs    02/08/24 0251 02/08/24 0707 02/09/24 0344  HGB 10.7*  --  10.9*  HCT 33.3*  --  34.2*  PLT 209  --  237  APTT  --    < > 68*  LABPROT 19.5*  --  16.7*  INR 1.6*  --  1.3*  HEPARINUNFRC  --    < > >1.10*  CREATININE 1.35*  --   --    < > = values in this interval not displayed.    Estimated Creatinine Clearance: 29.3 mL/min (A) (by C-G formula based on SCr of 1.35 mg/dL (H)).  PAST MEDICAL HISTORY: Past Medical History:  Diagnosis Date   Anemia    past hx   Arthritis    hands and feet   Chronic kidney disease    COPD (chronic obstructive pulmonary disease) (HCC)    Dyspnea    Dysrhythmia    Pt reports slow HR and papatations   Heart murmur    past hx   Hypercholesteremia    Hypertension    Rheumatic fever    (x2) in past   Wears dentures    full upper    ASSESSMENT: 89 y.o. female with PMH including recent DVT diagnosis is presenting with worsening DVT. She had been prescribed Eliquis  for DVT diagnosed 01/03/2024 but per discussions between RN and EDP, her facility had not been giving it regularly -- last dose unknown. Pharmacy has been consulted to initiate and manage heparin  intravenous infusion.   10/11: Per Dr. Josette, team is considering this an apixaban  failure and are transitioning patient to warfarin. INR goal 2-3. Heparin  continued as a bridge.  Heparin  Assessment: 10/10:  Heparin  gtt and bolus given @ 1524 10/10: HL @ 1536 = > 1.10 (not true baseline b/c drawn after heparin  started) 10/10: HL @ 1753 = > 1.10 10/10: aPTT @ 1923 = 185, elevated  10/11: 0707 aPTT: 84 HL > 1.1 10/11: 0959 aPTT: 64-  drawn too early 10/11: 1642 aPTT: 58, subtherapeutic  10/12: 0344 aPTT: 68  HL > 1.10  Warfarin Assessment: Date INR Warfarin Dose  10/11 1.6 2.5 mg (ordered)         Pertinent medications: Has not been taking Eliquis  as prescribed  Goal(s) of therapy: Heparin  level 0.3 - 0.7 units/mL aPTT 66 - 102 seconds INR 2 - 3 Monitor platelets by anticoagulation protocol: Yes   Baseline anticoagulation labs: Recent Labs    02/07/24 0948 02/07/24 1536 02/08/24 0251 02/08/24 0707 02/08/24 0959 02/08/24 1642 02/09/24 0344  APTT  --    < >  --    < > 64* 58* 68*  INR 1.7*  --  1.6*  --   --   --  1.3*  HGB 12.0  --  10.7*  --   --   --  10.9*  PLT 242  --  209  --   --   --  237   < > = values in this interval not displayed.  Baseline heparin   ordered Will order baseline anti-Xa level to assess for false elevation.   Heparin  PLAN: 10/12 @ 0344:  aPTT = 68,  HL = > 1.10 - aPTT therapeutic X 1 ,  HL elevated from Eliquis  PTA - continue pt on current rate and recheck aPTT in 8 hrs - recheck HL on 10/13 with AM labs  - Monitor heparin  level daily until correlating with aPTT.  - CBC daily while on heparin .  - Discontinue heparin  when INR in therapeutic.  Warfarin PLAN: INR is subtherapeutic at 1.6. Give warfarin 2.5 mg PO x 1. Monitor INR daily.    Silver Selinda BIRCH, PharmD Clinical Pharmacist 02/09/2024 5:43 AM

## 2024-02-09 NOTE — Consult Note (Addendum)
 Hematology/Oncology Consult note [phone] [phone]      Patient Care Team: Lenon Layman ORN, MD as PCP - General (Internal Medicine)   Name of the patient: Roberta Ross  [id number]  1934/04/30   REASON FOR COSULTATION:  Lower extremity DVT History of presenting illness-  89 y.o. female with PMH of COPD, RA, CHF, hypertension, A-fib and DVT on Eliquis  who presented emergency room for evaluation of lower extremity pain.  01/03/2024, patient was diagnosed of nonocclusive thrombus within the bilateral greater saphenous veins and the bilateral femoral veins.  She was placed on Eliquis .  Patient was started on Eliquis  10 mg twice daily then reduced to 5 mg twice daily.  01/03/2024, CT angiogram of chest was negative for PE. Patient started to have worsening of pain of right lower extremity 02/07/2024, when patient presented emergency room, right lower extremity ultrasound showed worsening acute left lower extremity with now  involves the common femoral vein (nonocclusive) and the popliteal vein (complete occlusion secondary to acute distal thrombosis) Patient was in the facility,  reports to be compliant with Eliquis .  Hematology was consulted for anticoagulation options. Patient was seen at the bedside She has been evaluated by vascular surgeon who has recommended thrombectomy.  Patient and family did not want to proceed with procedure.  Patient and family did not want to proceed with procedure.  Given that patient has chronic kidney disease, both Xarelto and Lovenox  are not a good option for her.  Patient has been switched to heparin  with bridging to Coumadin.  Patient lives in a facility if she walks with a walker.  She has bilateral joint deformities.  She denies any recent nausea vomiting diarrhea which may affect Eliquis  absorption.  Allergies  Allergen Reactions   Metoprolol Shortness Of Breath   Penicillins Other (See Comments)    Internal swelling  including throat TOLERATES CEFTRIAXONE    Methotrexate And Trimetrexate Nausea Only   Doxycycline Hyclate Rash    Patient Active Problem List   Diagnosis Date Noted   Acute kidney injury superimposed on CKD 02/08/2024   Pressure injury of skin 02/08/2024   Decubitus ulcer of right buttock, stage 2 (HCC) 02/08/2024   Acute DVT (deep venous thrombosis) (HCC) 02/07/2024   DVT (deep venous thrombosis) (HCC) 01/26/2024   Abdominal pain 02/26/2023   Mycoplasma pneumoniae pneumonia 02/22/2023   COVID-19 virus infection 02/15/2023   COVID 02/15/2023   Rheumatoid arthritis (HCC) 01/24/2022   CAP (community acquired pneumonia) 01/21/2022   Sepsis (HCC) secondary to community-acquired pneumonia and UTI 01/21/2022   Acute on chronic systolic CHF (congestive heart failure) (HCC) 01/21/2022   UTI (urinary tract infection) 01/21/2022   HLD (hyperlipidemia) 01/21/2022   Chronic kidney disease, stage 3b (HCC) 01/21/2022   Sepsis due to pneumonia (HCC)    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   Closed fracture of femur, intertrochanteric, left, initial encounter (HCC) 03/27/2020   Accidental fall 03/27/2020   Preoperative clearance 03/27/2020   Closed left hip fracture, initial encounter (HCC) 03/27/2020   Simple chronic bronchitis (HCC) 09/01/2018   Atrial fibrillation, chronic (HCC) 09/16/2017   History of subarachnoid hemorrhage 07/21/2017   Prediabetes 03/21/2017   GAD (generalized anxiety disorder) 02/01/2016   Hypercalcemia 02/01/2016   Essential hypertension 01/04/2015   Stage 3b chronic kidney disease (HCC) 08/24/2014   Pure hypercholesterolemia 02/09/2014   H/O rheumatoid arthritis 10/20/2013     Past Medical History:  Diagnosis Date   Anemia    past hx   Arthritis  hands and feet   Chronic kidney disease    COPD (chronic obstructive pulmonary disease) (HCC)    Dyspnea    Dysrhythmia    Pt reports slow HR and papatations   Heart murmur    past hx    Hypercholesteremia    Hypertension    Rheumatic fever    (x2) in past   Wears dentures    full upper     Past Surgical History:  Procedure Laterality Date   ABDOMINAL HYSTERECTOMY     APPENDECTOMY     as a child   CATARACT EXTRACTION W/PHACO Left 01/10/2015   Procedure: CATARACT EXTRACTION PHACO AND INTRAOCULAR LENS PLACEMENT (IOC);  Surgeon: Donzell Arlyce Budd, MD;  Location: [facility];  Service: Ophthalmology;  Laterality: Left;   COMPLEX WOUND CLOSURE Left 11/15/2021   Procedure: COMPLEX WOUND CLOSURE;  Surgeon: Edda Mt, MD;  Location: [facility];  Service: ENT;  Laterality: Left;   EXCISION MASS HEAD Left 11/15/2021   Procedure: EXCISION MALIGNANT MASS EAR;  Surgeon: Edda Mt, MD;  Location: [facility];  Service: ENT;  Laterality: Left;   INTRAMEDULLARY (IM) NAIL INTERTROCHANTERIC Left 03/28/2020   Procedure: INTRAMEDULLARY (IM) NAIL INTERTROCHANTRIC-Hip Fracture;  Surgeon: Tobie Priest, MD;  Location: [facility];  Service: Orthopedics;  Laterality: Left;   TONSILLECTOMY      Social History   Socioeconomic History   Marital status: Single    Spouse name: Not on file   Number of children: Not on file   Years of education: Not on file   Highest education level: Not on file  Occupational History   Not on file  Tobacco Use   Smoking status: Former    Current packs/day: 0.00    Average packs/day: 0.3 packs/day for 60.0 years (15.0 ttl pk-yrs)    Types: Cigarettes    Start date: 58    Quit date: 2020    Years since quitting: 5.7   Smokeless tobacco: Never  Vaping Use   Vaping status: Never Used  Substance and Sexual Activity   Alcohol use: No   Drug use: Not Currently   Sexual activity: Not on file  Other Topics Concern   Not on file  Social History Narrative   Not on file   Social Drivers of Health   Financial Resource Strain: Not on file  Food Insecurity: No Food Insecurity (02/07/2024)   Hunger Vital Sign    Worried About Running Out  of Food in the Last Year: Never true    Ran Out of Food in the Last Year: Never true  Transportation Needs: No Transportation Needs (02/07/2024)   PRAPARE - Administrator, Civil Service (Medical): No    Lack of Transportation (Non-Medical): No  Physical Activity: Not on file  Stress: Not on file  Social Connections: Socially Isolated (02/07/2024)   Social Connection and Isolation Panel    Frequency of Communication with Friends and Family: More than three times a week    Frequency of Social Gatherings with Friends and Family: More than three times a week    Attends Religious Services: Never    Database administrator or Organizations: No    Attends Banker Meetings: Never    Marital Status: Widowed  Intimate Partner Violence: Not At Risk (02/07/2024)   Humiliation, Afraid, Rape, and Kick questionnaire    Fear of Current or Ex-Partner: No    Emotionally Abused: No    Physically Abused: No    Sexually Abused:  No     Family History  Problem Relation Age of Onset   Cervical cancer Mother    Heart attack Father    Prostate cancer Father    Asthma Brother      Current Facility-Administered Medications:    albuterol  (PROVENTIL ) (2.5 MG/3ML) 0.083% nebulizer solution 3 mL, 3 mL, Inhalation, Q4H PRN, Paudel, Keshab, MD   diclofenac Sodium (VOLTAREN) 1 % topical gel 4 g, 4 g, Topical, QID, Paudel, Keshab, MD, 4 g at 02/08/24 2109   diltiazem  (CARDIZEM  LA) 24 hr tablet 240 mg, 240 mg, Oral, Daily, Paudel, Keshab, MD, 240 mg at 02/09/24 0900   folic acid (FOLVITE) tablet 0.5 mg, 500 mcg, Oral, q AM, Paudel, Keshab, MD, 0.5 mg at 02/09/24 0631   heparin  ADULT infusion 100 units/mL (25000 units/250mL), 1,100 Units/hr, Intravenous, Continuous, Hunt, Madison H, RPH, Last Rate: 11 mL/hr at 02/09/24 0904, 1,100 Units/hr at 02/09/24 0904   morphine  (PF) 2 MG/ML injection 2 mg, 2 mg, Intravenous, Q4H PRN, Paudel, Keshab, MD   ondansetron  (ZOFRAN ) tablet 4 mg, 4 mg, Oral,  Q6H PRN **OR** ondansetron  (ZOFRAN ) injection 4 mg, 4 mg, Intravenous, Q6H PRN, Paudel, Keshab, MD   oxyCODONE  (Oxy IR/ROXICODONE ) immediate release tablet 5 mg, 5 mg, Oral, Q4H PRN, Paudel, Keshab, MD   polyethylene glycol (MIRALAX  / GLYCOLAX ) packet 17 g, 17 g, Oral, Daily PRN, Paudel, Keshab, MD   predniSONE (DELTASONE) tablet 40 mg, 40 mg, Oral, Q breakfast, Wieting, Richard, MD, 40 mg at 02/09/24 0859   senna-docusate (Senokot-S) tablet 2 tablet, 2 tablet, Oral, BID, Paudel, Keshab, MD, 2 tablet at 02/08/24 2109   sertraline (ZOLOFT) tablet 50 mg, 50 mg, Oral, Daily, Paudel, Keshab, MD, 50 mg at 02/09/24 0900   simvastatin  (ZOCOR ) tablet 10 mg, 10 mg, Oral, QPM, Paudel, Keshab, MD, 10 mg at 02/08/24 1617   torsemide  (DEMADEX ) tablet 20 mg, 20 mg, Oral, QODAY, Paudel, Keshab, MD, 20 mg at 02/08/24 0837   Warfarin - Pharmacist Dosing Inpatient, , Does not apply, q1600, Perley Lum DEL, RPH, Given at 02/08/24 1617  Review of Systems  Constitutional:  Negative for appetite change, chills, fatigue and fever.  HENT:   Negative for hearing loss and voice change.   Eyes:  Negative for eye problems.  Respiratory:  Negative for chest tightness, cough and shortness of breath.   Cardiovascular:  Negative for chest pain.  Gastrointestinal:  Negative for abdominal distention, abdominal pain and blood in stool.  Endocrine: Negative for hot flashes.  Genitourinary:  Negative for difficulty urinating and frequency.   Musculoskeletal:  Positive for arthralgias.       Right lower extremity pain  Skin:  Negative for itching and rash.  Neurological:  Negative for extremity weakness.  Hematological:  Negative for adenopathy.  Psychiatric/Behavioral:  Negative for confusion.     PHYSICAL EXAM Vitals:   02/08/24 1540 02/08/24 2022 02/09/24 0333 02/09/24 0736  BP: 102/66 (!) 103/58 98/64 128/71  Pulse: 73 (!) 56 80 87  Resp: 17 18 16 17   Temp: 97.9 F (36.6 C) 98.3 F (36.8 C) 98.2 F (36.8 C) 98.6  F (37 C)  TempSrc: Oral   Oral  SpO2: 95% 96% 96% 99%  Weight:      Height:       Physical Exam Constitutional:      General: She is not in acute distress. HENT:     Head: Normocephalic and atraumatic.  Eyes:     General: No scleral icterus. Cardiovascular:  Rate and Rhythm: Normal rate and regular rhythm.  Pulmonary:     Effort: Pulmonary effort is normal. No respiratory distress.  Abdominal:     General: There is no distension.     Palpations: Abdomen is soft.  Musculoskeletal:        General: Deformity present. Normal range of motion.     Cervical back: Normal range of motion and neck supple.  Skin:    General: Skin is warm and dry.     Findings: No erythema.  Neurological:     Mental Status: She is alert and oriented to person, place, and time. Mental status is at baseline.     Motor: No abnormal muscle tone.  Psychiatric:        Mood and Affect: Mood and affect normal.       LABORATORY STUDIES    Latest Ref Rng & Units 02/09/2024    3:44 AM 02/08/2024    2:51 AM 02/07/2024    9:48 AM  CBC  WBC 4.0 - 10.5 K/uL 6.4  5.8  7.7   Hemoglobin 12.0 - 15.0 g/dL 89.0  89.2  87.9   Hematocrit 36.0 - 46.0 % 34.2  33.3  37.2   Platelets 150 - 400 K/uL 237  209  242       Latest Ref Rng & Units 02/09/2024    3:44 AM 02/08/2024    2:51 AM 02/07/2024    9:48 AM  CMP  Glucose 70 - 99 mg/dL  856  872   BUN 8 - 23 mg/dL  28  25   Creatinine 9.55 - 1.00 mg/dL 8.59  8.64  8.28   Sodium 135 - 145 mmol/L  139  141   Potassium 3.5 - 5.1 mmol/L  4.2  4.3   Chloride 98 - 111 mmol/L  107  104   CO2 22 - 32 mmol/L  23  26   Calcium 8.9 - 10.3 mg/dL  9.4  89.9   Total Protein 6.5 - 8.1 g/dL  6.2  7.3   Total Bilirubin 0.0 - 1.2 mg/dL  0.6  0.8   Alkaline Phos 38 - 126 U/L  78  88   AST 15 - 41 U/L  10  12   ALT 0 - 44 U/L  9  8      RADIOGRAPHIC STUDIES: I have personally reviewed the radiological images as listed and agreed with the findings in the report. US   Venous Img Lower Unilateral Right Result Date: 02/07/2024 CLINICAL DATA:  Leg swelling previously documented DVT identified on 01/03/2024 EXAM: RIGHT LOWER EXTREMITY VENOUS DOPPLER ULTRASOUND TECHNIQUE: Gray-scale sonography with compression, as well as color and duplex ultrasound, were performed to evaluate the deep venous system(s) from the level of the common femoral vein through the popliteal and proximal calf veins. COMPARISON:  Bilateral lower extremity venous Doppler ultrasound 01/03/2024. FINDINGS: Right lower extremity Common femoral vein: Partially compressible acute DVT nonocclusive. Including the saphenofemoral junction. Profunda vein: Normal compressibility and respiratory phasicity and response to augmentation. Femoral vein: Nonocclusive thrombus again seen with partial compressibility. Popliteal vein: The proximal popliteal vein is patent, however distal popliteal vein is occluded thrombus. Calf veins: Noncompressible in a solitary posterior tibial vein and both peroneal veins. IMPRESSION: Worsening acute right lower extremity DVT which now involves the common femoral vein (nonocclusive) and the popliteal vein (complete occlusion secondary to acute distal thrombosis). Sent to [facility] for physician communication Electronically Signed   By: Cordella Banner   On: 02/07/2024  13:45   CT FEMUR RIGHT WO CONTRAST Result Date: 02/07/2024 CLINICAL DATA:  Bone lesion on radiography EXAM: CT OF THE LOWER RIGHT EXTREMITY WITHOUT CONTRAST TECHNIQUE: Multidetector CT imaging of the right lower extremity was performed according to the standard protocol. RADIATION DOSE REDUCTION: This exam was performed according to the departmental dose-optimization program which includes automated exposure control, adjustment of the mA and/or kV according to patient size and/or use of iterative reconstruction technique. COMPARISON:  None Available. FINDINGS: Bones/Joint/Cartilage 5.1 by 1.5 by 1.3 cm sclerotic lesion in the mid  to distal femoral diaphysis with general chondroid matrix favoring enchondroma over bone infarct. Sharp transition zone. No significant degree of endosteal scalloping. No cortical breakthrough or soft tissue component. Ligaments Suboptimally assessed by CT. Muscles and Tendons Unremarkable Soft tissues Atheromatous vascular calcifications. Sigmoid colon diverticulosis. Atrophic right gluteus minimus muscle. Spurring of the pubic bones. IMPRESSION: 1. 5.1 by 1.5 by 1.3 cm sclerotic lesion in the mid to distal femoral diaphysis with general chondroid matrix favoring benign enchondroma. No worrisome/adverse features to suggest chondrosarcoma. 2. Atheromatous vascular calcifications. 3. Sigmoid colon diverticulosis. 4. Atrophic right gluteus minimus muscle. Electronically Signed   By: Ryan Salvage M.D.   On: 02/07/2024 10:52   US  Venous Img Lower Bilateral Addendum Date: 01/03/2024 ADDENDUM REPORT: 01/03/2024 13:11 ADDENDUM: Results were discussed with Dr. Nilsa Dade at 1:07 p.m. [place] on January 03, 2024. Electronically Signed   By: Suzen Dials M.D.   On: 01/03/2024 13:11   Result Date: 01/03/2024 CLINICAL DATA:  Lower leg pain. EXAM: BILATERAL LOWER EXTREMITY VENOUS DOPPLER ULTRASOUND TECHNIQUE: Gray-scale sonography with graded compression, as well as color Doppler and duplex ultrasound were performed to evaluate the lower extremity deep venous systems from the level of the common femoral vein and including the common femoral, femoral, profunda femoral, popliteal and calf veins including the posterior tibial, peroneal and gastrocnemius veins when visible. The superficial great saphenous vein was also interrogated. Spectral Doppler was utilized to evaluate flow at rest and with distal augmentation maneuvers in the common femoral, femoral and popliteal veins. COMPARISON:  None Available. FINDINGS: RIGHT LOWER EXTREMITY Common Femoral Vein: No evidence of thrombus. Normal compressibility, respiratory  phasicity and response to augmentation. Saphenofemoral Junction: Evidence of nonocclusive thrombus with abnormal compressibility and flow on color Doppler imaging. Profunda Femoral Vein: No evidence of thrombus. Normal compressibility and flow on color Doppler imaging. Femoral Vein: Evidence of nonocclusive thrombus with abnormal compressibility, respiratory phasicity and response to augmentation. Popliteal Vein: No evidence of thrombus. Normal compressibility, respiratory phasicity and response to augmentation. Calf Veins: The RIGHT posterior tibial vein and RIGHT peroneal vein are limited in visualization. Superficial Great Saphenous Vein: No evidence of thrombus. Normal compressibility. Venous Reflux:  None. Other Findings:  None. LEFT LOWER EXTREMITY Common Femoral Vein: No evidence of thrombus. Normal compressibility, respiratory phasicity and response to augmentation. Saphenofemoral Junction: Evidence of nonocclusive thrombus with abnormal compressibility and flow on color Doppler imaging. Profunda Femoral Vein: No evidence of thrombus. Normal compressibility and flow on color Doppler imaging. Femoral Vein: Evidence of nonocclusive thrombus with abnormal compressibility, respiratory phasicity and response to augmentation. Popliteal Vein: No evidence of thrombus. Normal compressibility, respiratory phasicity and response to augmentation. Calf Veins: The LEFT posterior tibial vein and LEFT peroneal vein are limited in visualization. Superficial Great Saphenous Vein: No evidence of thrombus. Normal compressibility. Venous Reflux:  None. Other Findings:  None. IMPRESSION: 1. Limited evaluation of the BILATERAL posterior tibial veins and BILATERAL peroneal veins. 2. Evidence of nonocclusive thrombus  within the BILATERAL greater saphenous veins and BILATERAL femoral veins. Electronically Signed: By: Suzen Dials M.D. On: 01/03/2024 13:03   CT Angio Chest PE W and/or Wo Contrast Result Date:  01/03/2024 CLINICAL DATA:  Shortness of breath. EXAM: CT ANGIOGRAPHY CHEST WITH CONTRAST TECHNIQUE: Multidetector CT imaging of the chest was performed using the standard protocol during bolus administration of intravenous contrast. Multiplanar CT image reconstructions and MIPs were obtained to evaluate the vascular anatomy. RADIATION DOSE REDUCTION: This exam was performed according to the departmental dose-optimization program which includes automated exposure control, adjustment of the mA and/or kV according to patient size and/or use of iterative reconstruction technique. CONTRAST:  50mL OMNIPAQUE  IOHEXOL  350 MG/ML SOLN COMPARISON:  February 15, 2023 FINDINGS: Cardiovascular: There is marked severity calcification of the thoracic aorta with stable 3.8 cm diameter dilatation of the descending thoracic aorta. Satisfactory opacification of the pulmonary arteries to the segmental level. No evidence of acute pulmonary embolism. There is mild cardiomegaly with marked severity coronary artery calcification. No pericardial effusion. Mediastinum/Nodes: No enlarged mediastinal, hilar, or axillary lymph nodes. Subcentimeter partially calcified and noncalcified thyroid  nodules are seen within the right and left lobes of the thyroid  gland. The trachea and esophagus demonstrate no significant findings. Lungs/Pleura: Predominantly centrilobular emphysematous lung disease is again seen with mild scattered areas of scarring and/or atelectasis noted bilaterally. This is slightly more prominent along the periphery of the bilateral lower lobes and posterior medial aspect of the left lung base. No acute infiltrate, pleural effusion or pneumothorax is identified. Upper Abdomen: No acute abnormality. Musculoskeletal: Multilevel degenerative changes are seen throughout the thoracic spine. Review of the MIP images confirms the above findings. IMPRESSION: 1. No evidence of acute pulmonary embolism. 2. Stable 3.8 cm diameter dilatation of  the descending thoracic aorta. 3. Mild cardiomegaly with marked severity coronary artery calcification. 4. Predominantly centrilobular emphysematous lung disease with mild scattered areas of scarring and/or atelectasis. 5. Subcentimeter partially calcified and noncalcified thyroid  nodules. 6. Aortic atherosclerosis. Electronically Signed   By: Suzen Dials M.D.   On: 01/03/2024 12:55     Assessment and plan-   Acute lower extremity DVT, failed Eliquis  outpatient. Declined thrombectomy offered by vascular surgeon. Xarelto and Lovenox  were not good option due to her chronic kidney disease I agree with heparin  drip bridging to Coumadin with INR of 2-3.  Recommend patient to follow-up with her primary care provider for Coumadin dosing. Fall precaution  # AKI on CKD.  Creatinine has improved.  Avoid nephrotoxins Thank you for allowing me to participate in the care of this patient.   Zelphia Cap, MD, PhD Hematology Oncology 02/09/2024

## 2024-02-09 NOTE — Evaluation (Signed)
 Physical Therapy Evaluation Patient Details Name: Roberta Ross MRN: [id number] DOB: 02/29/1934 Today's Date: 02/09/2024  History of Present Illness  Roberta Ross is a pleasant 89 y.o. female with medical history significant for A-fib on Eliquis , HTN, CHF, COPD, DVT on Eliquis  who presented to ED for evaluation of bilateral leg pain.  Patient was diagnosed with DVT recently on right lower extremity.  Clinical Impression  Patient seen for initial PT evaluation due to decline in functional status. Patient is A&O x 4. Baseline mobility reported as modI with RW with frequent history of falls, currently requiring modA to stand at bedside and take a few steps forward and backwards for. Gait assessed with RW , limited by fear avoidance behavior, R leg pain and hand contracture. Pt goal is to return back to SNF. Pt and daughter in room agreeable to return with continued need of assistance and physical therapy. Clinical impression: patient presents with moderate mobility limitations secondary to acute medical complications . Recommend skilled PT to address safety, mobility, and discharge planning. PT recommend SNF at d/c.         If plan is discharge home, recommend the following: A lot of help with walking and/or transfers;Two people to help with walking and/or transfers;Two people to help with bathing/dressing/bathroom   Can travel by private vehicle   No    Equipment Recommendations    Recommendations for Other Services       Functional Status Assessment Patient has had a recent decline in their functional status and/or demonstrates limited ability to make significant improvements in function in a reasonable and predictable amount of time     Precautions / Restrictions Restrictions Weight Bearing Restrictions Per Provider Order: No      Mobility  Bed Mobility Overal bed mobility: Needs Assistance Bed Mobility: Rolling, Sidelying to Sit Rolling: Min  assist Sidelying to sit: Min assist            Transfers Overall transfer level: Needs assistance Equipment used: Rolling walker (2 wheels) Transfers: Sit to/from Stand Sit to Stand: Mod assist           General transfer comment: vc for hand placement and strategies to avoid fear avoidance due to history of fall and R calf/leg pain    Ambulation/Gait Ambulation/Gait assistance: Mod assist Gait Distance (Feet): 6 Feet Assistive device: Rolling walker (2 wheels) Gait Pattern/deviations: Step-to pattern, Trunk flexed       General Gait Details: vc for upright posture; significant fearful behavior with standing at bedside; vc and physical assistance for hand placement due to contractures  Stairs            Wheelchair Mobility     Tilt Bed    Modified Rankin (Stroke Patients Only)       Balance Overall balance assessment: Needs assistance Sitting-balance support: Bilateral upper extremity supported, Feet supported Sitting balance-Leahy Scale: Fair Sitting balance - Comments: fearful of putting R foot on floor due to pain Postural control: Left lateral lean Standing balance support: Bilateral upper extremity supported, Reliant on assistive device for balance Standing balance-Leahy Scale: Poor Standing balance comment: significant trembling and fearful of standing due to hx of falls             High level balance activites: Backward walking               Pertinent Vitals/Pain Pain Assessment Pain Assessment: Faces Faces Pain Scale: Hurts little more Pain Descriptors / Indicators: Constant, Aching Pain Intervention(s): Monitored during  session, Repositioned    Home Living Family/patient expects to be discharged to:: Skilled nursing facility                        Prior Function Prior Level of Function : Needs assist       Physical Assist : Mobility (physical) Mobility (physical): Transfers;Gait   Mobility Comments: pt is SNF  resident; able to ambulate at baseline with RW       Extremity/Trunk Assessment        Lower Extremity Assessment Lower Extremity Assessment: Generalized weakness;RLE deficits/detail RLE Deficits / Details: calf and posterior ankle pain    Cervical / Trunk Assessment Cervical / Trunk Assessment: Kyphotic  Communication   Communication Communication: No apparent difficulties Factors Affecting Communication: Hearing impaired    Cognition Arousal: Alert Behavior During Therapy: WFL for tasks assessed/performed   PT - Cognitive impairments: No apparent impairments                         Following commands: Impaired Following commands impaired: Only follows one step commands consistently     Cueing Cueing Techniques: Verbal cues, Tactile cues     General Comments      Exercises Other Exercises Other Exercises: Pt educated on importance of movement, strengthening and negative impact on sedentary behaviors   Assessment/Plan    PT Assessment Patient needs continued PT services  PT Problem List Decreased strength;Decreased range of motion;Decreased activity tolerance;Decreased balance;Decreased mobility       PT Treatment Interventions DME instruction;Gait training;Functional mobility training;Therapeutic activities;Therapeutic exercise;Balance training;Neuromuscular re-education    PT Goals (Current goals can be found in the Care Plan section)  Acute Rehab PT Goals Patient Stated Goal: Pt wants to get stronger PT Goal Formulation: With patient/family Time For Goal Achievement: 02/23/24 Potential to Achieve Goals: Fair    Frequency Min 3X/week     Co-evaluation               AM-PAC PT 6 Clicks Mobility  Outcome Measure Help needed turning from your back to your side while in a flat bed without using bedrails?: None Help needed moving from lying on your back to sitting on the side of a flat bed without using bedrails?: A Little Help needed  moving to and from a bed to a chair (including a wheelchair)?: A Little Help needed standing up from a chair using your arms (e.g., wheelchair or bedside chair)?: A Lot Help needed to walk in hospital room?: Total Help needed climbing 3-5 steps with a railing? : Total 6 Click Score: 14    End of Session Equipment Utilized During Treatment: Gait belt Activity Tolerance: Patient limited by pain;Patient tolerated treatment well (tolerated treatment relatively well by the end of session) Patient left: in bed;with bed alarm set Nurse Communication: Mobility status PT Visit Diagnosis: Other abnormalities of gait and mobility (R26.89);History of falling (Z91.81);Muscle weakness (generalized) (M62.81)    Time: 1234-1300 PT Time Calculation (min) (ACUTE ONLY): 26 min   Charges:   PT Evaluation $PT Eval Low Complexity: 1 Low   PT General Charges $$ ACUTE PT VISIT: 1 Visit         Sherlean Lesches DPT, PT    Norvil Martensen A Bisma Klett 02/09/2024, 1:17 PM

## 2024-02-10 DIAGNOSIS — G9341 Metabolic encephalopathy: Secondary | ICD-10-CM | POA: Diagnosis not present

## 2024-02-10 DIAGNOSIS — M10071 Idiopathic gout, right ankle and foot: Secondary | ICD-10-CM | POA: Diagnosis not present

## 2024-02-10 DIAGNOSIS — I82401 Acute embolism and thrombosis of unspecified deep veins of right lower extremity: Secondary | ICD-10-CM | POA: Diagnosis not present

## 2024-02-10 DIAGNOSIS — N179 Acute kidney failure, unspecified: Secondary | ICD-10-CM | POA: Diagnosis not present

## 2024-02-10 LAB — APTT: aPTT: 73 s — ABNORMAL HIGH (ref 24–36)

## 2024-02-10 LAB — CBC
HCT: 33.5 % — ABNORMAL LOW (ref 36.0–46.0)
Hemoglobin: 10.7 g/dL — ABNORMAL LOW (ref 12.0–15.0)
MCH: 31.6 pg (ref 26.0–34.0)
MCHC: 31.9 g/dL (ref 30.0–36.0)
MCV: 98.8 fL (ref 80.0–100.0)
Platelets: 240 K/uL (ref 150–400)
RBC: 3.39 MIL/uL — ABNORMAL LOW (ref 3.87–5.11)
RDW: 13.2 % (ref 11.5–15.5)
WBC: 8.9 K/uL (ref 4.0–10.5)
nRBC: 0 % (ref 0.0–0.2)

## 2024-02-10 LAB — PROTIME-INR
INR: 1.2 (ref 0.8–1.2)
Prothrombin Time: 15.6 s — ABNORMAL HIGH (ref 11.4–15.2)

## 2024-02-10 LAB — HEPARIN LEVEL (UNFRACTIONATED): Heparin Unfractionated: >1.1 [IU]/mL — ABNORMAL HIGH (ref 0.30–0.70)

## 2024-02-10 MED ORDER — WARFARIN SODIUM 4 MG PO TABS
4.0000 mg | ORAL_TABLET | Freq: Once | ORAL | Status: AC
  Administered 2024-02-10: 4 mg via ORAL
  Filled 2024-02-10: qty 1

## 2024-02-10 MED ORDER — PREDNISONE 20 MG PO TABS
20.0000 mg | ORAL_TABLET | Freq: Every day | ORAL | Status: DC
  Administered 2024-02-11: 20 mg via ORAL
  Filled 2024-02-10: qty 1

## 2024-02-10 MED ORDER — MELATONIN 5 MG PO TABS
5.0000 mg | ORAL_TABLET | Freq: Every day | ORAL | Status: DC
  Administered 2024-02-10 – 2024-02-12 (×3): 5 mg via ORAL
  Filled 2024-02-10 (×3): qty 1

## 2024-02-10 NOTE — Progress Notes (Signed)
 Progress Note   Patient: Roberta Ross FMW:[id number] DOB: 10/22/1933 DOA: 02/07/2024     3 DOS: the patient was seen and examined on 02/10/2024   Brief hospital course: 89 y.o. female with medical history significant for A-fib on Eliquis , HTN, CHF, COPD, DVT on Eliquis  who presented to ED for evaluation of bilateral leg pain.  Patient was diagnosed with DVT recently on right lower extremity.  She was started on Eliquis  10 mg twice daily for 7 days then reduce to 5 mg twice daily indefinitely.  She followed up in vein and vascular clinic without any symptoms to follow-up again in 3 months with venous ultrasound.  Patient reported that she started having pain on her right leg which has worsened and she was not able to walk due to pain.  She had x-ray done at her facility which demonstrated a bone lesion in her femur.  EDP discussed with orthopedic team and they advised that this is a benign lesion and no further workup needed at this point.  Patient was in the ER on 9/5 for right leg pain with that time ultrasound demonstrated bilateral nonocclusive DVTs.  But today lower extremity ultrasound showed an extension of the previous nonocclusive DVT in her right lower extremity to right common femoral vein.     ED Course: Upon arrival to the ED, patient is found to be in severe pain of right lower extremity.  She was found to have worsening DVT on right lower extremity then 1 month prior.  Vascular surgery was consulted in the ED who advised that patient may benefit from thrombectomy.  Vascular surgery came and evaluated patient and patient did not want to pursue any procedures.  ED discussed with hematology Dr. Babara who advised that patient failed outpatient Eliquis  and now she needs to be on Coumadin.  She advised to start patient on heparin  drip and transition to Coumadin.  Hospitalist service was consulted for evaluation for admission.  10/11.  Patient stated initially she had pain in her right  ankle and when she was given prednisone that have improved.  Found to have extensive DVT and was admitted for heparin  drip to switch over to Coumadin.  Patient and family initially declined thrombectomy but now were considering.  I spoke with vascular surgeon on-call covering for the weekend and recommended medical management with anticoagulation. 10/12.  Still having pain in right ankle.  10/13.  Called for patient having some confusion.  Decrease prednisone down to 20 mg today.  Patient ankle pain is better today than it was yesterday.  Assessment and Plan: * Acute DVT (deep venous thrombosis) (HCC) Patient on heparin  drip.  This is an Eliquis  failure.  Continue Coumadin.  Pharmacy dosing Coumadin.  Patient initially declined thrombectomy.    Acute gout Right ankle.  Decrease prednisone to 20 mg daily.  Status post dose of colchicine on 10/12.  Acute metabolic encephalopathy Decrease dose of prednisone down to 20 mg daily.  Will give melatonin at night.  Acute kidney injury superimposed on CKD AKI on CKD stage IIIb.  Creatinine 1.71 on presentation down to 1.35.  Last creatinine 1.4 with a GFR 36  Atrial fibrillation, chronic (HCC) Currently on heparin  drip and pharmacy dosing Coumadin patient on diltiazem .  Patient had sinus pauses up to 2.4 seconds.  With chronic atrial fibrillation we do not do anything with pauses that are less than 5 seconds.    Essential hypertension On Cardizem   HLD (hyperlipidemia) On Zocor   Pressure injury of  skin Wound 02/07/24 2030 Pressure Injury Buttocks Right Stage 2 -  Partial thickness loss of dermis presenting as a shallow open injury with a red, pink wound bed without slough. (Active)      Rheumatoid arthritis (HCC) On chronic prednisone.    GAD (generalized anxiety disorder) On Zoloft       Subjective: Called by nursing staff this morning that patient was confused.  Patient did answer questions appropriately.  She was given a nebulizer  treatment for shortness of breath.  Ankle pain is doing better.  Admitted with DVT  Physical Exam: Vitals:   02/09/24 0736 02/09/24 2051 02/10/24 0423 02/10/24 0808  BP: 128/71 110/63 112/73 114/62  Pulse: 87 82 81 73  Resp: 17 19 17 16   Temp: 98.6 F (37 C) (!) 97.3 F (36.3 C) (!) 97.3 F (36.3 C) 98.2 F (36.8 C)  TempSrc: Oral Oral Oral Oral  SpO2: 99% 95% 97% 98%  Weight:      Height:       Physical Exam HENT:     Head: Normocephalic.     Mouth/Throat:     Pharynx: No oropharyngeal exudate.  Eyes:     General: Lids are normal.     Conjunctiva/sclera: Conjunctivae normal.  Cardiovascular:     Rate and Rhythm: Normal rate and regular rhythm.     Heart sounds: Normal heart sounds, S1 normal and S2 normal.  Pulmonary:     Breath sounds: No decreased breath sounds, wheezing, rhonchi or rales.  Abdominal:     Palpations: Abdomen is soft.     Tenderness: There is no abdominal tenderness.  Musculoskeletal:     Right lower leg: No swelling.     Left lower leg: No swelling.     Right ankle: Tenderness present.  Skin:    General: Skin is warm.     Findings: No rash.  Neurological:     Mental Status: She is alert and oriented to person, place, and time.     Data Reviewed: INR 1.2, hemoglobin 10.7  Family Communication: Spoke with daughter on the phone  Disposition: Status is: Inpatient Remains inpatient appropriate because: INR only 1.2.  On heparin  drip and Coumadin orally.  Awaiting INR to be therapeutic prior to disposition  Planned Discharge Destination: Rehab    Time spent: 28 minutes  Author: Charlie Patterson, MD 02/10/2024 4:11 PM  For on call review [email or web address].

## 2024-02-10 NOTE — Consult Note (Signed)
 Pharmacy Consult Note - Anticoagulation  Pharmacy Consult for warfarin with heparin  bridge Indication: DVT  PATIENT MEASUREMENTS: Height: 5' 5 (165.1 cm) Weight: 72.7 kg (160 lb 4.4 oz) IBW/kg (Calculated) : 57 HEPARIN  DW (KG): 71.7  VITAL SIGNS: Temp: 97.3 F (36.3 C) (10/13 0423) Temp Source: Oral (10/13 0423) BP: 112/73 (10/13 0423) Pulse Rate: 81 (10/13 0423)  Recent Labs    02/09/24 0344 02/09/24 1426 02/10/24 0419  HGB 10.9*  --  10.7*  HCT 34.2*  --  33.5*  PLT 237  --  240  APTT 68*   < > 73*  LABPROT 16.7*  --  15.6*  INR 1.3*  --  1.2  HEPARINUNFRC >1.10*  --  >1.10*  CREATININE 1.40*  --   --    < > = values in this interval not displayed.    Estimated Creatinine Clearance: 28.3 mL/min (A) (by C-G formula based on SCr of 1.4 mg/dL (H)).  PAST MEDICAL HISTORY: Past Medical History:  Diagnosis Date   Anemia    past hx   Arthritis    hands and feet   Chronic kidney disease    COPD (chronic obstructive pulmonary disease) (HCC)    Dyspnea    Dysrhythmia    Pt reports slow HR and papatations   Heart murmur    past hx   Hypercholesteremia    Hypertension    Rheumatic fever    (x2) in past   Wears dentures    full upper    ASSESSMENT: 89 y.o. female with PMH including recent DVT diagnosis is presenting with worsening DVT. She had been prescribed Eliquis  for DVT diagnosed 01/03/2024 but per discussions between RN and EDP, her facility had not been giving it regularly -- last dose unknown. Pharmacy has been consulted to initiate and manage heparin  intravenous infusion.   10/11: Per Dr. Josette, team is considering this an apixaban  failure and are transitioning patient to warfarin. INR goal 2-3. Heparin  continued as a bridge.  Heparin  Assessment: 10/10:  Heparin  gtt and bolus given @ 1524 10/10: HL @ 1536 = > 1.10 (not true baseline b/c drawn after heparin  started) 10/10: HL @ 1753 = > 1.10 10/10: aPTT @ 1923 = 185, elevated  10/11: 0707 aPTT: 84 HL  > 1.1 10/12: 0344 aPTT: 68  HL > 1.10 10/12: 1426 aPTT: 75 10/13: 0419 aPTT: 73   HL > 1.10  Warfarin Assessment: Date INR Warfarin Dose  10/11 1.6 2.5 mg   10/12 1.3 3 mg   10/13 1.2 4 mg     Pertinent medications: Has not been taking Eliquis  as prescribed  Goal(s) of therapy: Heparin  level 0.3 - 0.7 units/mL aPTT 66 - 102 seconds INR 2 - 3 Monitor platelets by anticoagulation protocol: Yes   Baseline anticoagulation labs: Recent Labs    02/08/24 0251 02/08/24 0707 02/09/24 0344 02/09/24 1426 02/10/24 0419  APTT  --    < > 68* 75* 73*  INR 1.6*  --  1.3*  --  1.2  HGB 10.7*  --  10.9*  --  10.7*  PLT 209  --  237  --  240   < > = values in this interval not displayed.  Baseline heparin   ordered Will order baseline anti-Xa level to assess for false elevation.   Heparin  PLAN: 10/13 @ 0419:  aPTT = 73,  HL = > 1.10 - aPTT therapeutic X 3,  HL elevated from Eliquis  PTA - Continuing pt on current rate and recheck  aPTT and HL on 10/14 with AM labs - Monitor heparin  level daily until correlating with aPTT.  - CBC daily while on heparin .  - Discontinue heparin  when INR in therapeutic.  Warfarin PLAN: INR trending down. Patient eating 80-100% meals. Also balancing that patient is older age, with low albumin, and baseline INR was >1.4. Give warfarin 4 mg PO x 1 Daily INR. CBC daily while on heparin .   Will M. Lenon, PharmD, BCPS Clinical Pharmacist 02/10/2024 7:36 AM

## 2024-02-10 NOTE — Progress Notes (Signed)
 Heparin  gtt verified running 19ml/hr.

## 2024-02-10 NOTE — Progress Notes (Signed)
 Mobility Specialist - Progress Note   02/10/24 1440  Mobility  Activity Stood at bedside (x2)  Level of Assistance Minimal assist, patient does 75% or more  Assistive Device Front wheel walker  Activity Response Tolerated well  Mobility visit 1 Mobility  Mobility Specialist Start Time (ACUTE ONLY) 1415  Mobility Specialist Stop Time (ACUTE ONLY) 1437  Mobility Specialist Time Calculation (min) (ACUTE ONLY) 22 min   Pt supine upon entry, utilizing RA. Pt denied transfer to the recliner, however agreeable to OOB activity w/ max encouragement. Pt completed bed mod ModI, extra time required to bring BLE EOB. Pt STS to RW (elevated bed height) x2 MinA-CGA, requiring less assistance to stand from EOB during the second STS. Pt returned to bed, left supine with alarm set and needs within reach.  America Silvan Mobility Specialist 02/10/24 2:47 PM

## 2024-02-10 NOTE — Care Management Important Message (Signed)
 Important Message  Patient Details  Name: Roberta Ross MRN: [id number] Date of Birth: 06-Aug-1933   Important Message Given:  Yes - Medicare IM     Kerrilyn Azbill W, CMA 02/10/2024, 1:44 PM

## 2024-02-10 NOTE — Consult Note (Signed)
 Pharmacy Consult Note - Anticoagulation  Pharmacy Consult for warfarin with heparin  bridge Indication: DVT  PATIENT MEASUREMENTS: Height: 5' 5 (165.1 cm) Weight: 72.7 kg (160 lb 4.4 oz) IBW/kg (Calculated) : 57 HEPARIN  DW (KG): 71.7  VITAL SIGNS: Temp: 97.3 F (36.3 C) (10/13 0423) Temp Source: Oral (10/13 0423) BP: 112/73 (10/13 0423) Pulse Rate: 81 (10/13 0423)  Recent Labs    02/09/24 0344 02/09/24 1426 02/10/24 0419  HGB 10.9*  --  10.7*  HCT 34.2*  --  33.5*  PLT 237  --  240  APTT 68*   < > 73*  LABPROT 16.7*  --  15.6*  INR 1.3*  --  1.2  HEPARINUNFRC >1.10*  --  >1.10*  CREATININE 1.40*  --   --    < > = values in this interval not displayed.    Estimated Creatinine Clearance: 28.3 mL/min (A) (by C-G formula based on SCr of 1.4 mg/dL (H)).  PAST MEDICAL HISTORY: Past Medical History:  Diagnosis Date   Anemia    past hx   Arthritis    hands and feet   Chronic kidney disease    COPD (chronic obstructive pulmonary disease) (HCC)    Dyspnea    Dysrhythmia    Pt reports slow HR and papatations   Heart murmur    past hx   Hypercholesteremia    Hypertension    Rheumatic fever    (x2) in past   Wears dentures    full upper    ASSESSMENT: 89 y.o. female with PMH including recent DVT diagnosis is presenting with worsening DVT. She had been prescribed Eliquis  for DVT diagnosed 01/03/2024 but per discussions between RN and EDP, her facility had not been giving it regularly -- last dose unknown. Pharmacy has been consulted to initiate and manage heparin  intravenous infusion.   10/11: Per Dr. Josette, team is considering this an apixaban  failure and are transitioning patient to warfarin. INR goal 2-3. Heparin  continued as a bridge.  Heparin  Assessment: 10/10:  Heparin  gtt and bolus given @ 1524 10/10: HL @ 1536 = > 1.10 (not true baseline b/c drawn after heparin  started) 10/10: HL @ 1753 = > 1.10 10/10: aPTT @ 1923 = 185, elevated  10/11: 0707 aPTT: 84 HL  > 1.1 10/12: 0344 aPTT: 68  HL > 1.10 10/12: 1426 aPTT: 75 10/13: 0419 aPTT: 73   HL > 1.10  Warfarin Assessment: Date INR Warfarin Dose  10/11 1.6 2.5 mg   10/12 1.3 3 mg      Pertinent medications: Has not been taking Eliquis  as prescribed  Goal(s) of therapy: Heparin  level 0.3 - 0.7 units/mL aPTT 66 - 102 seconds INR 2 - 3 Monitor platelets by anticoagulation protocol: Yes   Baseline anticoagulation labs: Recent Labs    02/08/24 0251 02/08/24 0707 02/09/24 0344 02/09/24 1426 02/10/24 0419  APTT  --    < > 68* 75* 73*  INR 1.6*  --  1.3*  --  1.2  HGB 10.7*  --  10.9*  --  10.7*  PLT 209  --  237  --  240   < > = values in this interval not displayed.  Baseline heparin   ordered Will order baseline anti-Xa level to assess for false elevation.   Heparin  PLAN: 10/13 @ 0419:  aPTT = 73,  HL = > 1.10 - aPTT therapeutic X 3,  HL elevated from Eliquis  PTA - will continue pt on current rate and recheck aPTT and HL on  10/14 with AM labs - Monitor heparin  level daily until correlating with aPTT.  - CBC daily while on heparin .  - Discontinue heparin  when INR in therapeutic.  Warfarin PLAN: INR trending down. Will see how the pt responses to the 2.5 mg dose with tomorrow's INR. Will give warfarin 3 mg x 1 tonight. Will dose slightly conservatively due to pt's age, low albumin, and baseline INR was > 1.4. Daily INR. CBC daily while on heparin .    Silver Selinda BIRCH, PharmD, BCPS Clinical Pharmacist 02/10/2024 5:22 AM

## 2024-02-10 NOTE — Assessment & Plan Note (Signed)
 Mental status improved today as compared to yesterday.  Continue give melatonin at night.

## 2024-02-11 DIAGNOSIS — G9341 Metabolic encephalopathy: Secondary | ICD-10-CM | POA: Diagnosis not present

## 2024-02-11 DIAGNOSIS — N179 Acute kidney failure, unspecified: Secondary | ICD-10-CM | POA: Diagnosis not present

## 2024-02-11 DIAGNOSIS — M10071 Idiopathic gout, right ankle and foot: Secondary | ICD-10-CM | POA: Diagnosis not present

## 2024-02-11 DIAGNOSIS — I82401 Acute embolism and thrombosis of unspecified deep veins of right lower extremity: Secondary | ICD-10-CM | POA: Diagnosis not present

## 2024-02-11 LAB — CBC
HCT: 35.1 % — ABNORMAL LOW (ref 36.0–46.0)
Hemoglobin: 11.2 g/dL — ABNORMAL LOW (ref 12.0–15.0)
MCH: 31.5 pg (ref 26.0–34.0)
MCHC: 31.9 g/dL (ref 30.0–36.0)
MCV: 98.6 fL (ref 80.0–100.0)
Platelets: 250 K/uL (ref 150–400)
RBC: 3.56 MIL/uL — ABNORMAL LOW (ref 3.87–5.11)
RDW: 13.2 % (ref 11.5–15.5)
WBC: 9.2 K/uL (ref 4.0–10.5)
nRBC: 0 % (ref 0.0–0.2)

## 2024-02-11 LAB — CREATININE, SERUM
Creatinine, Ser: 1.43 mg/dL — ABNORMAL HIGH (ref 0.44–1.00)
GFR, Estimated: 35 mL/min — ABNORMAL LOW (ref >60)

## 2024-02-11 LAB — PROTIME-INR
INR: 1.3 — ABNORMAL HIGH (ref 0.8–1.2)
Prothrombin Time: 16.6 s — ABNORMAL HIGH (ref 11.4–15.2)

## 2024-02-11 LAB — HEPARIN LEVEL (UNFRACTIONATED): Heparin Unfractionated: 0.69 [IU]/mL (ref 0.30–0.70)

## 2024-02-11 LAB — APTT: aPTT: 80 s — ABNORMAL HIGH (ref 24–36)

## 2024-02-11 MED ORDER — COLCHICINE 0.3 MG HALF TABLET
0.3000 mg | ORAL_TABLET | Freq: Once | ORAL | Status: AC
  Administered 2024-02-11: 0.3 mg via ORAL
  Filled 2024-02-11: qty 1

## 2024-02-11 MED ORDER — ALLOPURINOL 100 MG PO TABS
50.0000 mg | ORAL_TABLET | Freq: Every day | ORAL | Status: DC
Start: 2024-02-11 — End: 2024-02-13
  Administered 2024-02-11 – 2024-02-13 (×3): 50 mg via ORAL
  Filled 2024-02-11 (×3): qty 0.5

## 2024-02-11 MED ORDER — PREDNISONE 20 MG PO TABS
20.0000 mg | ORAL_TABLET | Freq: Once | ORAL | Status: AC
  Administered 2024-02-11: 20 mg via ORAL
  Filled 2024-02-11: qty 1

## 2024-02-11 MED ORDER — WARFARIN SODIUM 6 MG PO TABS
6.0000 mg | ORAL_TABLET | Freq: Once | ORAL | Status: AC
  Administered 2024-02-11: 6 mg via ORAL
  Filled 2024-02-11: qty 1

## 2024-02-11 MED ORDER — PREDNISONE 20 MG PO TABS
40.0000 mg | ORAL_TABLET | Freq: Every day | ORAL | Status: DC
  Administered 2024-02-12 – 2024-02-13 (×2): 40 mg via ORAL
  Filled 2024-02-11 (×2): qty 2

## 2024-02-11 NOTE — Progress Notes (Signed)
 Physical Therapy Treatment Patient Details Name: Roberta Ross MRN: [id number] DOB: 1934/02/03 Today's Date: 02/11/2024   History of Present Illness Roberta Ross is a pleasant 89 y.o. female with medical history significant for A-fib on Eliquis , HTN, CHF, COPD, DVT on Eliquis  who presented to ED for evaluation of bilateral leg pain.  Patient was diagnosed with DVT recently on right lower extremity.    PT Comments  Pt ready for session.  She is assisted to EOB with min a x 1 and bed features. Steady in sitting.  She is able to stand to RW with min a x 1 and transfer to/from [facility] with RW and min a x 1.  After short rest and overall improvement in transfers from last session, she is able to walk 50' x 2 with RW and min a x 1.  She does voice some fear of falling but it does not negatively affect session today.  She does need min verbal cues as she tries to pull up from walker on each attempt without intervention.  Updated board and discussed with tech.  Encouraged walking to/from bathroom with staff as she will need to be mobile to return to ALF. Pur wick is removed as she seems continent and stated she does not wear briefs or pads at baseline.  Pt from [facility] per pt.  Stated she walked to/from dining room with walker prior to admission.  [facility] typically needs pt to be at baseline before return to facility.  Will reach out to [facility] but pt may need SNF to return to baseline for a safe discharge.  Will pull in mobility team for increased gait along with nursing walking to/from bathroom.     If plan is discharge home, recommend the following: A little help with walking and/or transfers;A little help with bathing/dressing/bathroom;Assistance with cooking/housework;Help with stairs or ramp for entrance;Assist for transportation   Can travel by private vehicle        Equipment Recommendations       Recommendations for Other Services       Precautions / Restrictions  Precautions Precautions: Fall Precaution/Restrictions Comments: on heparin  Iv Restrictions Weight Bearing Restrictions Per Provider Order: No     Mobility  Bed Mobility Overal bed mobility: Needs Assistance Bed Mobility: Supine to Sit   Sidelying to sit: Min assist         Patient Response: Cooperative  Transfers Overall transfer level: Needs assistance Equipment used: Rolling walker (2 wheels) Transfers: Sit to/from Stand Sit to Stand: Min assist           General transfer comment: cues for hand placements.  min a when pushing from seated surface    Ambulation/Gait Ambulation/Gait assistance: Contact guard assist, Min assist Gait Distance (Feet): 50 Feet Assistive device: Rolling walker (2 wheels) Gait Pattern/deviations: Step-through pattern, Decreased step length - right, Decreased step length - left Gait velocity: dec     General Gait Details: does well with walker and +1 assist   Stairs             Wheelchair Mobility     Tilt Bed Tilt Bed Patient Response: Cooperative  Modified Rankin (Stroke Patients Only)       Balance Overall balance assessment: Needs assistance Sitting-balance support: Bilateral upper extremity supported, Feet supported Sitting balance-Leahy Scale: Good     Standing balance support: Bilateral upper extremity supported, Reliant on assistive device for balance Standing balance-Leahy Scale: Fair Standing balance comment: +1 hands on assist, less fear today  which allowed for improved mobility                            Communication Communication Communication: No apparent difficulties Factors Affecting Communication: Hearing impaired  Cognition Arousal: Alert Behavior During Therapy: WFL for tasks assessed/performed   PT - Cognitive impairments: No family/caregiver present to determine baseline                       PT - Cognition Comments: some cognition deficits noted.  unsure of  baseline Following commands: Impaired Following commands impaired: Follows one step commands inconsistently    Cueing Cueing Techniques: Verbal cues, Tactile cues  Exercises Other Exercises Other Exercises: to [facility] to void.  assist for care    General Comments        Pertinent Vitals/Pain Pain Assessment Pain Assessment: No/denies pain Pain Intervention(s): Monitored during session, Repositioned    Home Living                          Prior Function            PT Goals (current goals can now be found in the care plan section) Progress towards PT goals: Progressing toward goals    Frequency    Min 3X/week      PT Plan      Co-evaluation              AM-PAC PT 6 Clicks Mobility   Outcome Measure  Help needed turning from your back to your side while in a flat bed without using bedrails?: None Help needed moving from lying on your back to sitting on the side of a flat bed without using bedrails?: A Little Help needed moving to and from a bed to a chair (including a wheelchair)?: A Little Help needed standing up from a chair using your arms (e.g., wheelchair or bedside chair)?: A Little Help needed to walk in hospital room?: A Little Help needed climbing 3-5 steps with a railing? : A Lot 6 Click Score: 18    End of Session Equipment Utilized During Treatment: Gait belt Activity Tolerance: Patient tolerated treatment well Patient left: in chair;with chair alarm set;with call bell/phone within reach Nurse Communication: Mobility status PT Visit Diagnosis: Other abnormalities of gait and mobility (R26.89);History of falling (Z91.81);Muscle weakness (generalized) (M62.81)     Time: 0933-1000 PT Time Calculation (min) (ACUTE ONLY): 27 min  Charges:    $Gait Training: 8-22 mins $Therapeutic Activity: 8-22 mins PT General Charges $$ ACUTE PT VISIT: 1 Visit                   Lauraine Gills, PTA 02/11/24, 11:35 AM

## 2024-02-11 NOTE — Plan of Care (Signed)
   Problem: Education: Goal: Knowledge of General Education information will improve Description Including pain rating scale, medication(s)/side effects and non-pharmacologic comfort measures Outcome: Progressing

## 2024-02-11 NOTE — Progress Notes (Signed)
 Mobility Specialist - Progress Note     02/11/24 1550  Mobility  Activity Stood at bedside;Ambulated with assistance  Level of Assistance Contact guard assist, steadying assist  Assistive Device Front wheel walker  Distance Ambulated (ft) 40 ft  Range of Motion/Exercises Active  Activity Response Tolerated well  Mobility Referral Yes  Mobility visit 1 Mobility  Mobility Specialist Start Time (ACUTE ONLY) 1532  Mobility Specialist Stop Time (ACUTE ONLY) 1546  Mobility Specialist Time Calculation (min) (ACUTE ONLY) 14 min   Pt resting in bed on RA upon entry. Pt STS MinA and ambulates to hallway out to NS CGA with RW. Pt returned to bed and left with needs in reach. Bed alarm activated.   Guido Rumble Mobility Specialist 02/11/24, 4:04 PM

## 2024-02-11 NOTE — Consult Note (Signed)
 Pharmacy Consult Note - Anticoagulation  Pharmacy Consult for warfarin with heparin  bridge Indication: DVT  PATIENT MEASUREMENTS: Height: 5' 5 (165.1 cm) Weight: 72.7 kg (160 lb 4.4 oz) IBW/kg (Calculated) : 57 HEPARIN  DW (KG): 71.7  VITAL SIGNS: Temp: 98.6 F (37 C) (10/14 0451) Temp Source: Oral (10/14 0451) BP: 112/61 (10/14 0451) Pulse Rate: 85 (10/14 0451)  Recent Labs    02/11/24 0536  HGB 11.2*  HCT 35.1*  PLT 250  APTT 80*  LABPROT 16.6*  INR 1.3*  HEPARINUNFRC 0.69  CREATININE 1.43*    Estimated Creatinine Clearance: 27.7 mL/min (A) (by C-G formula based on SCr of 1.43 mg/dL (H)).  PAST MEDICAL HISTORY: Past Medical History:  Diagnosis Date   Anemia    past hx   Arthritis    hands and feet   Chronic kidney disease    COPD (chronic obstructive pulmonary disease) (HCC)    Dyspnea    Dysrhythmia    Pt reports slow HR and papatations   Heart murmur    past hx   Hypercholesteremia    Hypertension    Rheumatic fever    (x2) in past   Wears dentures    full upper    ASSESSMENT: 89 y.o. female with PMH including recent DVT diagnosis is presenting with worsening DVT. She had been prescribed Eliquis  for DVT diagnosed 01/03/2024 but per discussions between RN and EDP, her facility had not been giving it regularly -- last dose unknown. Pharmacy has been consulted to initiate and manage heparin  intravenous infusion.   10/11: Per Dr. Josette, team is considering this an apixaban  failure and are transitioning patient to warfarin. INR goal 2-3. Heparin  continued as a bridge.  Heparin  Assessment: 10/10:  Heparin  gtt and bolus given @ 1524 10/10: HL @ 1536 = > 1.10 (not true baseline b/c drawn after heparin  started) 10/10: HL @ 1753 = > 1.10 10/10: aPTT @ 1923 = 185, elevated  10/11: 0707 aPTT: 84 HL > 1.1 10/12: 0344 aPTT: 68  HL > 1.10 10/12: 1426 aPTT: 75 10/13: 0419 aPTT: 73   HL > 1.10  Warfarin Assessment: Date INR Warfarin Dose  10/11 1.6 2.5 mg    10/12 1.3 3 mg   10/13 1.2 4 mg  10/14 1.3      Pertinent medications: Has not been taking Eliquis  as prescribed  Goal(s) of therapy: Heparin  level 0.3 - 0.7 units/mL aPTT 66 - 102 seconds INR 2 - 3 Monitor platelets by anticoagulation protocol: Yes   Baseline anticoagulation labs: Recent Labs    02/09/24 0344 02/09/24 1426 02/10/24 0419 02/11/24 0536  APTT 68* 75* 73* 80*  INR 1.3*  --  1.2 1.3*  HGB 10.9*  --  10.7* 11.2*  PLT 237  --  240 250  Baseline heparin   ordered Will order baseline anti-Xa level to assess for false elevation.   Heparin  PLAN: 10/14 @ 0536:  aPTT = 80,  HL = 0.69 - aPTT therapeutic X 4,  HL correlating x 1 - Continuing pt on current rate and recheck aPTT and HL on 10/15 with AM labs - Monitor heparin  level daily until correlating with aPTT.  - CBC daily while on heparin .  - Discontinue heparin  when INR in therapeutic.  Warfarin PLAN: INR subtherapeutic. Patient eating 80-100% meals. Also balancing that patient is older age, with low albumin, and baseline INR was >1.4. Give warfarin 6 mg PO x 1 Daily INR. CBC daily while on heparin .   Will M. Lenon, PharmD,  BCPS Clinical Pharmacist 02/11/2024 7:32 AM

## 2024-02-11 NOTE — Consult Note (Signed)
 Pharmacy Consult Note - Anticoagulation  Pharmacy Consult for warfarin with heparin  bridge Indication: DVT  PATIENT MEASUREMENTS: Height: 5' 5 (165.1 cm) Weight: 72.7 kg (160 lb 4.4 oz) IBW/kg (Calculated) : 57 HEPARIN  DW (KG): 71.7  VITAL SIGNS: Temp: 98.6 F (37 C) (10/14 0451) Temp Source: Oral (10/14 0451) BP: 112/61 (10/14 0451) Pulse Rate: 85 (10/14 0451)  Recent Labs    02/09/24 0344 02/09/24 1426 02/11/24 0536  HGB 10.9*   < > 11.2*  HCT 34.2*   < > 35.1*  PLT 237   < > 250  APTT 68*   < > 80*  LABPROT 16.7*   < > 16.6*  INR 1.3*   < > 1.3*  HEPARINUNFRC >1.10*   < > 0.69  CREATININE 1.40*  --   --    < > = values in this interval not displayed.    Estimated Creatinine Clearance: 28.3 mL/min (A) (by C-G formula based on SCr of 1.4 mg/dL (H)).  PAST MEDICAL HISTORY: Past Medical History:  Diagnosis Date   Anemia    past hx   Arthritis    hands and feet   Chronic kidney disease    COPD (chronic obstructive pulmonary disease) (HCC)    Dyspnea    Dysrhythmia    Pt reports slow HR and papatations   Heart murmur    past hx   Hypercholesteremia    Hypertension    Rheumatic fever    (x2) in past   Wears dentures    full upper    ASSESSMENT: 89 y.o. female with PMH including recent DVT diagnosis is presenting with worsening DVT. She had been prescribed Eliquis  for DVT diagnosed 01/03/2024 but per discussions between RN and EDP, her facility had not been giving it regularly -- last dose unknown. Pharmacy has been consulted to initiate and manage heparin  intravenous infusion.   10/11: Per Dr. Josette, team is considering this an apixaban  failure and are transitioning patient to warfarin. INR goal 2-3. Heparin  continued as a bridge.  Heparin  Assessment: 10/10:  Heparin  gtt and bolus given @ 1524 10/10: HL @ 1536 = > 1.10 (not true baseline b/c drawn after heparin  started) 10/10: HL @ 1753 = > 1.10 10/10: aPTT @ 1923 = 185, elevated  10/11: 0707 aPTT: 84  HL > 1.1 10/12: 0344 aPTT: 68  HL > 1.10 10/12: 1426 aPTT: 75 10/13: 0419 aPTT: 73   HL > 1.10  Warfarin Assessment: Date INR Warfarin Dose  10/11 1.6 2.5 mg   10/12 1.3 3 mg   10/13 1.2 4 mg  10/14 1.3      Pertinent medications: Has not been taking Eliquis  as prescribed  Goal(s) of therapy: Heparin  level 0.3 - 0.7 units/mL aPTT 66 - 102 seconds INR 2 - 3 Monitor platelets by anticoagulation protocol: Yes   Baseline anticoagulation labs: Recent Labs    02/09/24 0344 02/09/24 1426 02/10/24 0419 02/11/24 0536  APTT 68* 75* 73* 80*  INR 1.3*  --  1.2 1.3*  HGB 10.9*  --  10.7* 11.2*  PLT 237  --  240 250  Baseline heparin   ordered Will order baseline anti-Xa level to assess for false elevation.   Heparin  PLAN: 10/14 @ 0536:  aPTT = 80,  HL = 0.69 - aPTT therapeutic X 4,  HL correlating x 1 - Continuing pt on current rate and recheck aPTT and HL on 10/15 with AM labs - Monitor heparin  level daily until correlating with aPTT.  - CBC  daily while on heparin .  - Discontinue heparin  when INR in therapeutic.  Warfarin PLAN: INR trending down. Patient eating 80-100% meals. Also balancing that patient is older age, with low albumin, and baseline INR was >1.4. Give warfarin 4 mg PO x 1 Daily INR. CBC daily while on heparin .   Rankin CANDIE Dills, PharmD, [facility] 02/11/2024 6:51 AM

## 2024-02-11 NOTE — Progress Notes (Signed)
 Progress Note   Patient: Roberta Ross FMW:[id number] DOB: Feb 06, 1934 DOA: 02/07/2024     4 DOS: the patient was seen and examined on 02/11/2024   Brief hospital course: 89 y.o. female with medical history significant for A-fib on Eliquis , HTN, CHF, COPD, DVT on Eliquis  who presented to ED for evaluation of bilateral leg pain.  Patient was diagnosed with DVT recently on right lower extremity.  She was started on Eliquis  10 mg twice daily for 7 days then reduce to 5 mg twice daily indefinitely.  She followed up in vein and vascular clinic without any symptoms to follow-up again in 3 months with venous ultrasound.  Patient reported that she started having pain on her right leg which has worsened and she was not able to walk due to pain.  She had x-ray done at her facility which demonstrated a bone lesion in her femur.  EDP discussed with orthopedic team and they advised that this is a benign lesion and no further workup needed at this point.  Patient was in the ER on 9/5 for right leg pain with that time ultrasound demonstrated bilateral nonocclusive DVTs.  But today lower extremity ultrasound showed an extension of the previous nonocclusive DVT in her right lower extremity to right common femoral vein.     ED Course: Upon arrival to the ED, patient is found to be in severe pain of right lower extremity.  She was found to have worsening DVT on right lower extremity then 1 month prior.  Vascular surgery was consulted in the ED who advised that patient may benefit from thrombectomy.  Vascular surgery came and evaluated patient and patient did not want to pursue any procedures.  ED discussed with hematology Dr. Babara who advised that patient failed outpatient Eliquis  and now she needs to be on Coumadin.  She advised to start patient on heparin  drip and transition to Coumadin.  Hospitalist service was consulted for evaluation for admission.  10/11.  Patient stated initially she had pain in her right  ankle and when she was given prednisone that have improved.  Found to have extensive DVT and was admitted for heparin  drip to switch over to Coumadin.  Patient and family initially declined thrombectomy but now were considering.  I spoke with vascular surgeon on-call covering for the weekend and recommended medical management with anticoagulation. 10/12.  Still having pain in right ankle.  10/13.  Called for patient having some confusion.  Decrease prednisone down to 20 mg today.  Patient ankle pain is better today than it was yesterday. 10/14.  Patient wanted to go back up on her prednisone to 40 mg daily for right ankle pain.  Will readjust the dose of colchicine.  INR still low at 1.3  Assessment and Plan: * Acute DVT (deep venous thrombosis) (HCC) Patient on heparin  drip.  This is an Eliquis  failure.  Continue Coumadin.  Pharmacy dosing Coumadin.  Patient initially declined thrombectomy.  INR still low at 1.3.  Pharmacy increase Coumadin to 6 mg for today.  Acute gout Right ankle.  Increase prednisone to 40 mg daily (chronically takes 5mg ).  Status post dose of colchicine on 10/12 and 10/14.  Low-dose allopurinol started.  Acute metabolic encephalopathy Mental status improved today as compared to yesterday.  Continue give melatonin at night.  Acute kidney injury superimposed on CKD AKI on CKD stage IIIb.  Creatinine 1.71 on presentation down to 1.35.  Last creatinine 1.43 with a GFR 35  Atrial fibrillation, chronic (HCC) Currently  on heparin  drip and pharmacy dosing Coumadin patient on diltiazem .  Patient had sinus pauses up to 2.4 seconds.  With chronic atrial fibrillation we do not do anything with pauses that are less than 5 seconds.    Essential hypertension On Cardizem   HLD (hyperlipidemia) On Zocor   Pressure injury of skin Wound 02/07/24 2030 Pressure Injury Buttocks Right Stage 2 -  Partial thickness loss of dermis presenting as a shallow open injury with a red, pink wound bed  without slough. (Active)   Present on admission  Rheumatoid arthritis (HCC) On chronic prednisone.    GAD (generalized anxiety disorder) On Zoloft      Subjective: Patient wanting to go back up to 40 mg on the prednisone for right ankle pain.  Since her mental status was better today I agreed to do so.  Patient admitted with Eliquis  failure for DVT right leg.  Also found to have acute gout right ankle.  Physical Exam: Vitals:   02/11/24 0837 02/11/24 0838 02/11/24 1233 02/11/24 1641  BP: 124/80 124/80 108/70 110/65  Pulse: 65  86 82  Resp: 18  17 17   Temp:   98.3 F (36.8 C) 98 F (36.7 C)  TempSrc:   Oral   SpO2: 97%  99% 98%  Weight:      Height:       Physical Exam HENT:     Head: Normocephalic.     Mouth/Throat:     Pharynx: No oropharyngeal exudate.  Eyes:     General: Lids are normal.     Conjunctiva/sclera: Conjunctivae normal.  Cardiovascular:     Rate and Rhythm: Normal rate. Rhythm irregularly irregular.     Heart sounds: Normal heart sounds, S1 normal and S2 normal.  Pulmonary:     Breath sounds: No decreased breath sounds, wheezing, rhonchi or rales.  Abdominal:     Palpations: Abdomen is soft.     Tenderness: There is no abdominal tenderness.  Musculoskeletal:     Right lower leg: No swelling.     Left lower leg: No swelling.     Right ankle: Tenderness present.  Skin:    General: Skin is warm.     Findings: No rash.  Neurological:     Mental Status: She is alert and oriented to person, place, and time.     Data Reviewed: Creatinine 1.43, hemoglobin 11.2, platelet count 250, white blood count 9.2  Family Communication: Spoke with daughter on the phone  Disposition: Status is: Inpatient Remains inpatient appropriate because: INR will need to be therapeutic  Planned Discharge Destination: Patient is from assisted living.  Walked better today.  She is hoping go back to assisted living but currently physical therapy recommending  rehab.    Time spent: 28 minutes  Author: Charlie Patterson, MD 02/11/2024 6:21 PM  For on call review [email or web address].

## 2024-02-12 DIAGNOSIS — I82411 Acute embolism and thrombosis of right femoral vein: Secondary | ICD-10-CM | POA: Diagnosis not present

## 2024-02-12 LAB — CBC
HCT: 34.3 % — ABNORMAL LOW (ref 36.0–46.0)
Hemoglobin: 11.2 g/dL — ABNORMAL LOW (ref 12.0–15.0)
MCH: 32.4 pg (ref 26.0–34.0)
MCHC: 32.7 g/dL (ref 30.0–36.0)
MCV: 99.1 fL (ref 80.0–100.0)
Platelets: 230 K/uL (ref 150–400)
RBC: 3.46 MIL/uL — ABNORMAL LOW (ref 3.87–5.11)
RDW: 13.2 % (ref 11.5–15.5)
WBC: 9.9 K/uL (ref 4.0–10.5)
nRBC: 0 % (ref 0.0–0.2)

## 2024-02-12 LAB — APTT: aPTT: 96 s — ABNORMAL HIGH (ref 24–36)

## 2024-02-12 LAB — HEPARIN LEVEL (UNFRACTIONATED): Heparin Unfractionated: 0.64 [IU]/mL (ref 0.30–0.70)

## 2024-02-12 LAB — PROTIME-INR
INR: 1.8 — ABNORMAL HIGH (ref 0.8–1.2)
Prothrombin Time: 21.4 s — ABNORMAL HIGH (ref 11.4–15.2)

## 2024-02-12 MED ORDER — WARFARIN SODIUM 6 MG PO TABS
6.0000 mg | ORAL_TABLET | Freq: Once | ORAL | Status: AC
  Administered 2024-02-12: 6 mg via ORAL
  Filled 2024-02-12: qty 1

## 2024-02-12 NOTE — Consult Note (Addendum)
 Pharmacy Consult Note - Anticoagulation  Pharmacy Consult for warfarin with heparin  bridge Indication: DVT  PATIENT MEASUREMENTS: Height: 5' 5 (165.1 cm) Weight: 72.7 kg (160 lb 4.4 oz) IBW/kg (Calculated) : 57 HEPARIN  DW (KG): 71.7  VITAL SIGNS: Temp: 98 F (36.7 C) (10/15 0720) Temp Source: Oral (10/15 0720) BP: 120/76 (10/15 0720) Pulse Rate: 81 (10/15 0720)  Recent Labs    02/11/24 0536 02/12/24 0315  HGB 11.2* 11.2*  HCT 35.1* 34.3*  PLT 250 230  APTT 80* 96*  LABPROT 16.6* 21.4*  INR 1.3* 1.8*  HEPARINUNFRC 0.69 0.64  CREATININE 1.43*  --     Estimated Creatinine Clearance: 27.7 mL/min (A) (by C-G formula based on SCr of 1.43 mg/dL (H)).  PAST MEDICAL HISTORY: Past Medical History:  Diagnosis Date   Anemia    past hx   Arthritis    hands and feet   Chronic kidney disease    COPD (chronic obstructive pulmonary disease) (HCC)    Dyspnea    Dysrhythmia    Pt reports slow HR and papatations   Heart murmur    past hx   Hypercholesteremia    Hypertension    Rheumatic fever    (x2) in past   Wears dentures    full upper    ASSESSMENT: 89 y.o. female with PMH including recent DVT diagnosis is presenting with worsening DVT. She had been prescribed Eliquis  for DVT diagnosed 01/03/2024 but per discussions between RN and EDP, her facility had not been giving it regularly -- last dose unknown. Pharmacy has been consulted to initiate and manage heparin  intravenous infusion.   10/11: Per Dr. Josette, team is considering this an apixaban  failure and are transitioning patient to warfarin. INR goal 2-3. Heparin  continued as a bridge.  Heparin  Assessment: 10/10:  Heparin  gtt and bolus given @ 1524 10/10: HL @ 1536 = > 1.10 (not true baseline b/c drawn after heparin  started) 10/10: HL @ 1753 = > 1.10 10/10: aPTT @ 1923 = 185, elevated  10/11: 0707 aPTT: 84 HL > 1.1 10/12: 0344 aPTT: 68  HL > 1.10 10/12: 1426 aPTT: 75 10/13: 0419 aPTT: 73   HL > 1.10  Warfarin  Assessment: Date INR Warfarin Dose  10/11 1.6 2.5 mg   10/12 1.3 3 mg   10/13 1.2 4 mg  10/14 1.3 6 mg  10/15 1.8 6 mg     Pertinent medications: Has not been taking Eliquis  as prescribed  Goal(s) of therapy: Heparin  level 0.3 - 0.7 units/mL aPTT 66 - 102 seconds INR 2 - 3 Monitor platelets by anticoagulation protocol: Yes   Baseline anticoagulation labs: Recent Labs    02/10/24 0419 02/11/24 0536 02/12/24 0315  APTT 73* 80* 96*  INR 1.2 1.3* 1.8*  HGB 10.7* 11.2* 11.2*  PLT 240 250 230  Baseline heparin   ordered Will order baseline anti-Xa level to assess for false elevation.   Heparin  PLAN: 10/15 @ 0315:  aPTT = 96,  HL = 0.64 - aPTT therapeutic X 5,  HL correlating x 2 - Continuing pt on current rate and recheck HL on 10/16 with AM labs - CBC daily while on heparin .  - Discontinue heparin  when INR therapeutic x 2.  Warfarin PLAN: INR still subtherapeutic but trending encouragingly. Patient ate less than half of her meals yesterday per chart -- will continue to monitor. Also balancing that patient is older age, with low albumin, and baseline INR was >1.4. Give warfarin 6 mg PO x 1 again today  Daily INR. CBC daily while on heparin .   Will M. Lenon, PharmD, BCPS Clinical Pharmacist 02/12/2024 7:29 AM

## 2024-02-12 NOTE — Plan of Care (Signed)
   Problem: Education: Goal: Knowledge of General Education information will improve Description Including pain rating scale, medication(s)/side effects and non-pharmacologic comfort measures Outcome: Progressing   Problem: Health Behavior/Discharge Planning: Goal: Ability to manage health-related needs will improve Outcome: Progressing

## 2024-02-12 NOTE — Plan of Care (Signed)
   Problem: Education: Goal: Knowledge of General Education information will improve Description: Including pain rating scale, medication(s)/side effects and non-pharmacologic comfort measures Outcome: Progressing   Problem: Activity: Goal: Risk for activity intolerance will decrease Outcome: Progressing   Problem: Elimination: Goal: Will not experience complications related to bowel motility Outcome: Progressing   Problem: Skin Integrity: Goal: Risk for impaired skin integrity will decrease Outcome: Progressing

## 2024-02-12 NOTE — Consult Note (Signed)
 Pharmacy Consult Note - Anticoagulation  Pharmacy Consult for warfarin with heparin  bridge Indication: DVT  PATIENT MEASUREMENTS: Height: 5' 5 (165.1 cm) Weight: 72.7 kg (160 lb 4.4 oz) IBW/kg (Calculated) : 57 HEPARIN  DW (KG): 71.7  VITAL SIGNS: Temp: 98.6 F (37 C) (10/15 0400) Temp Source: Oral (10/15 0400) BP: 105/67 (10/15 0400) Pulse Rate: 76 (10/15 0400)  Recent Labs    02/11/24 0536 02/12/24 0315  HGB 11.2* 11.2*  HCT 35.1* 34.3*  PLT 250 230  APTT 80* 96*  LABPROT 16.6* 21.4*  INR 1.3* 1.8*  HEPARINUNFRC 0.69 0.64  CREATININE 1.43*  --     Estimated Creatinine Clearance: 27.7 mL/min (A) (by C-G formula based on SCr of 1.43 mg/dL (H)).  PAST MEDICAL HISTORY: Past Medical History:  Diagnosis Date   Anemia    past hx   Arthritis    hands and feet   Chronic kidney disease    COPD (chronic obstructive pulmonary disease) (HCC)    Dyspnea    Dysrhythmia    Pt reports slow HR and papatations   Heart murmur    past hx   Hypercholesteremia    Hypertension    Rheumatic fever    (x2) in past   Wears dentures    full upper    ASSESSMENT: 89 y.o. female with PMH including recent DVT diagnosis is presenting with worsening DVT. She had been prescribed Eliquis  for DVT diagnosed 01/03/2024 but per discussions between RN and EDP, her facility had not been giving it regularly -- last dose unknown. Pharmacy has been consulted to initiate and manage heparin  intravenous infusion.   10/11: Per Dr. Josette, team is considering this an apixaban  failure and are transitioning patient to warfarin. INR goal 2-3. Heparin  continued as a bridge.  Heparin  Assessment: 10/10:  Heparin  gtt and bolus given @ 1524 10/10: HL @ 1536 = > 1.10 (not true baseline b/c drawn after heparin  started) 10/10: HL @ 1753 = > 1.10 10/10: aPTT @ 1923 = 185, elevated  10/11: 0707 aPTT: 84 HL > 1.1 10/12: 0344 aPTT: 68  HL > 1.10 10/12: 1426 aPTT: 75 10/13: 0419 aPTT: 73   HL > 1.10  Warfarin  Assessment: Date INR Warfarin Dose  10/11 1.6 2.5 mg   10/12 1.3 3 mg   10/13 1.2 4 mg  10/14 1.3 6 mg  10/15 1.8      Pertinent medications: Has not been taking Eliquis  as prescribed  Goal(s) of therapy: Heparin  level 0.3 - 0.7 units/mL aPTT 66 - 102 seconds INR 2 - 3 Monitor platelets by anticoagulation protocol: Yes   Baseline anticoagulation labs: Recent Labs    02/10/24 0419 02/11/24 0536 02/12/24 0315  APTT 73* 80* 96*  INR 1.2 1.3* 1.8*  HGB 10.7* 11.2* 11.2*  PLT 240 250 230  Baseline heparin   ordered Will order baseline anti-Xa level to assess for false elevation.   Heparin  PLAN: 10/15 @ 0315:  aPTT = 96,  HL = 0.64 - aPTT therapeutic X 5,  HL correlating x 2 - Continuing pt on current rate and recheck HL on 10/16 with AM labs - CBC daily while on heparin .  - Discontinue heparin  when INR therapeutic x 2.  Warfarin PLAN: INR subtherapeutic. Patient eating 80-100% meals. Also balancing that patient is older age, with low albumin, and baseline INR was >1.4. Give warfarin 6 mg PO x 1 Daily INR. CBC daily while on heparin .   Rankin CANDIE Dills, PharmD, MBA 02/12/2024 4:21 AM

## 2024-02-12 NOTE — TOC Initial Note (Addendum)
 Transition of Care [facility]) - Initial/Assessment Note    Patient Details  Name: Roberta Ross MRN: [id number] Date of Birth: 08/23/1933  [facility]) CM/SW Contact:    Alvaro Louder, LCSW Phone Number: 02/12/2024, 9:19 AM  Clinical Narrative:    1:40 PM: [facility] called back and indicated that they can take the patient back as long as PT, OT, and RN orders are placed. LCSWA to inform MD   Per Chart review patient from [facility]. [facility] will not take patient back without rehab being done first. PCP is Layman Piety LCSWA Faxed out information to SNF's in [place]. LCSWA will present Facilities to patient at the bedside.              TOC to follow for discharge        Patient Goals and CMS Choice            Expected Discharge Plan and Services                                              Prior Living Arrangements/Services                       Activities of Daily Living   ADL Screening (condition at time of admission) Independently performs ADLs?: Yes (appropriate for developmental age) Is the patient deaf or have difficulty hearing?: No Does the patient have difficulty seeing, even when wearing glasses/contacts?: No Does the patient have difficulty concentrating, remembering, or making decisions?: No  Permission Sought/Granted                  Emotional Assessment              Admission diagnosis:  Acute DVT (deep venous thrombosis) (HCC) [I82.409] Acute deep vein thrombosis (DVT) of proximal vein of right lower extremity (HCC) [I82.4Y1] Patient Active Problem List   Diagnosis Date Noted   Acute metabolic encephalopathy 02/10/2024   Acute gout 02/09/2024   Acute kidney injury superimposed on CKD 02/08/2024   Pressure injury of skin 02/08/2024   Decubitus ulcer of right buttock, stage 2 (HCC) 02/08/2024   Acute DVT (deep venous thrombosis) (HCC) 02/07/2024   DVT (deep venous thrombosis) (HCC)  01/26/2024   Abdominal pain 02/26/2023   Mycoplasma pneumoniae pneumonia 02/22/2023   COVID-19 virus infection 02/15/2023   COVID 02/15/2023   Rheumatoid arthritis (HCC) 01/24/2022   CAP (community acquired pneumonia) 01/21/2022   Sepsis (HCC) secondary to community-acquired pneumonia and UTI 01/21/2022   Acute on chronic systolic CHF (congestive heart failure) (HCC) 01/21/2022   UTI (urinary tract infection) 01/21/2022   HLD (hyperlipidemia) 01/21/2022   Chronic kidney disease, stage 3b (HCC) 01/21/2022   Sepsis due to pneumonia (HCC)    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   Closed fracture of femur, intertrochanteric, left, initial encounter (HCC) 03/27/2020   Accidental fall 03/27/2020   Preoperative clearance 03/27/2020   Closed left hip fracture, initial encounter (HCC) 03/27/2020   Simple chronic bronchitis (HCC) 09/01/2018   Atrial fibrillation, chronic (HCC) 09/16/2017   History of subarachnoid hemorrhage 07/21/2017   Prediabetes 03/21/2017   GAD (generalized anxiety disorder) 02/01/2016   Hypercalcemia 02/01/2016   Essential hypertension 01/04/2015   Stage 3b chronic kidney disease (HCC) 08/24/2014   Pure hypercholesterolemia 02/09/2014   H/O rheumatoid arthritis 10/20/2013   PCP:  Lenon Layman ORN, MD Pharmacy:   [id number] [place] - [address] AT [facility] OF [address] [facility] [place] [address] Phone: [phone] Fax: [phone]  [facility] #[id number] [place], [place] - [address] AT [facility] OF [address] [place] [address] Phone: [phone] Fax: [phone]     Social Drivers of Health (SDOH) Social History: SDOH Screenings   Food Insecurity: No Food Insecurity (02/07/2024)  Housing: Low Risk  (02/07/2024)  Transportation Needs: No Transportation Needs (02/07/2024)  Utilities: Not At Risk (02/07/2024)  Social Connections: Socially Isolated  (02/07/2024)  Tobacco Use: Medium Risk (02/07/2024)   SDOH Interventions:     Readmission Risk Interventions     No data to display

## 2024-02-12 NOTE — Progress Notes (Signed)
 PROGRESS NOTE    Roberta Ross   FMW:[id number] DOB: Oct 13, 1933  DOA: 02/07/2024 Date of Service: 02/12/24 which is hospital day 5  PCP: Lenon Layman ORN, MD    Hospital course / significant events:   HPI: 89 y.o. female with medical history significant for A-fib on Eliquis , HTN, CHF, COPD, DVT on Eliquis  who presented to ED for evaluation of bilateral leg pain.  Patient was diagnosed with DVT recently 01/03/24 on right lower extremity --> increased eliwuis, dc, f/u w/ vascular 10/01 no concerns plan follow-up again in 3 months with venous ultrasound. Patient again to ED 10/10 not able to walk due to pain RLE. Of note, had x-ray done at her facility which demonstrated a bone lesion in femur (benign per ortho no f/u needed).    10/10: to ED, lower extremity ultrasound showed an extension of the previous nonocclusive DVT in her right lower extremity to right common femoral vein. Vascular surgery was consulted in the ED - recs thrombectomy, was declined by pt/fam at that time. EDP discussed with hematology Dr. Babara who advised start heparin  drip and transition to Coumadin.  Admitted to hospitalist. Concern also for acute gout R ankle  10/11: Patient stated initially she had pain in her right ankle and when she was given prednisone that have improved.  Now considering for thrombectomy, hospitalist spoke with vascular surgeon on-call covering for the weekend and recommended medical management with anticoagulation. 10/12: Still having pain in right ankle.  10/13: patient having some confusion.  Decrease prednisone down to 20 mg today.  Patient ankle pain is better today than it was yesterday. 10/14: wanted to go back up on her prednisone to 40 mg daily for right ankle pain.  Will readjust the dose of colchicine.  INR still low at 1.3. Qualifying for rehab but pt would like to go back to ALF 10/15: ALF will not takeher back without rehab, will work on this. INR still subtherapeutic,  remain on heparin        Consultants:  Vascular surgery  Informal d/w HemOnc and Ortho as above   Procedures/Surgeries: none      ASSESSMENT & PLAN:   Acute DVT (deep venous thrombosis)  Subtherapeutic INR  This is an Eliquis  failure Patient on heparin  drip Coumadin. Pharmacy dosing Coumadin.   Would check w/ hematology if ok to go on lovenox  bridge if SNF approved + coumadin still subtherapeutic    Acute gout Right ankle.   prednisone to 40 mg daily (chronically takes 5mg ).   Status post dose of colchicine on 10/12 and 10/14.   Low-dose allopurinol started.   Acute metabolic encephalopathy Mental status improved today as compared to yesterday.   Continue give melatonin at night.   Acute kidney injury superimposed on CKD AKI on CKD stage IIIb.  Creatinine 1.71 on presentation down to 1.35.  Last creatinine 1.43 with a GFR 35 Monitor BMP   Atrial fibrillation, chronic  Patient had sinus pauses up to 2.4 seconds. Currently on heparin  drip and pharmacy dosing Coumadin patient on diltiazem . chronic atrial fibrillation we do not do anything with pauses that are less than 5 seconds.     Essential hypertension On Cardizem    HLD (hyperlipidemia) On Zocor    Pressure injury of skin Wound 02/07/24 2030 Pressure Injury Buttocks Right Stage 2 -  Partial thickness loss of dermis presenting as a shallow open injury with a red, pink wound bed without slough. (Active)  Present on admission   Rheumatoid arthritis  On chronic  prednisone.     GAD (generalized anxiety disorder) On Zoloft    overweight based on BMI: Body mass index is 26.67 kg/m.SABRA Significantly low or high BMI is associated with higher medical risk.  Underweight - under 18  overweight - 25 to 29 obese - 30 or more Class 1 obesity: BMI of 30.0 to 34 Class 2 obesity: BMI of 35.0 to 39 Class 3 obesity: BMI of 40.0 to 49 Super Morbid Obesity: BMI 50-59 Super-super Morbid Obesity: BMI 60+ Healthy  nutrition and physical activity advised as adjunct to other disease management and risk reduction treatments    DVT prophylaxis: heparin   IV fluids: no continuous IV fluids  Nutrition: regular Central lines / other devices: none  Code Status: DNR ACP documentation reviewed: none on file in [facility]  [facility] needs: SNF rehab Medical barriers to dispo: INR. Expected medical readiness for discharge once therapeutic INR / if ok for lovenox  bridge .              Subjective / Brief ROS:  Patient reports feeling okay today no concerns Denies CP/SOB.  Pain controlled.  Denies new weakness.  Tolerating diet.  Reports no concerns w/ urination/defecation.   Family Communication: none at this time     Objective Findings:  Vitals:   02/11/24 1938 02/12/24 0400 02/12/24 0720 02/12/24 1437  BP: 102/79 105/67 120/76 (!) 93/58  Pulse: 69 76 81 77  Resp: 20 18 18 18   Temp: 98.6 F (37 C) 98.6 F (37 C) 98 F (36.7 C) 97.9 F (36.6 C)  TempSrc:  Oral Oral   SpO2: 97% 97% 96% 96%  Weight:      Height:        Intake/Output Summary (Last 24 hours) at 02/12/2024 1741 Last data filed at 02/12/2024 1442 Gross per 24 hour  Intake 720 ml  Output 1440 ml  Net -720 ml   Filed Weights   02/07/24 1440  Weight: 72.7 kg    Examination:  Physical Exam Constitutional:      General: She is not in acute distress. Cardiovascular:     Rate and Rhythm: Normal rate and regular rhythm.  Pulmonary:     Effort: Pulmonary effort is normal.     Breath sounds: Normal breath sounds.  Neurological:     Mental Status: She is alert. Mental status is at baseline.  Psychiatric:        Mood and Affect: Mood normal.        Behavior: Behavior normal.          Scheduled Medications:   allopurinol  50 mg Oral Daily   diclofenac Sodium  4 g Topical QID   diltiazem   240 mg Oral Daily   folic acid  500 mcg Oral q AM   melatonin  5 mg Oral QHS   predniSONE  40 mg Oral Q breakfast    senna-docusate  2 tablet Oral BID   sertraline  50 mg Oral Daily   simvastatin   10 mg Oral QPM   torsemide   20 mg Oral QODAY   Warfarin - Pharmacist Dosing Inpatient   Does not apply q1600    Continuous Infusions:  heparin  1,100 Units/hr (02/12/24 0552)    PRN Medications:  albuterol , morphine  injection, ondansetron  **OR** ondansetron  (ZOFRAN ) IV, oxyCODONE , polyethylene glycol  Antimicrobials from admission:  Anti-infectives (From admission, onward)    None           Data Reviewed:  I have personally reviewed the following...  CBC:  Recent Labs  Lab 02/07/24 0948 02/08/24 0251 02/09/24 0344 02/10/24 0419 02/11/24 0536 02/12/24 0315  WBC 7.7 5.8 6.4 8.9 9.2 9.9  NEUTROABS 5.5  --   --   --   --   --   HGB 12.0 10.7* 10.9* 10.7* 11.2* 11.2*  HCT 37.2 33.3* 34.2* 33.5* 35.1* 34.3*  MCV 100.0 99.4 99.4 98.8 98.6 99.1  PLT 242 209 237 240 250 230   Basic Metabolic Panel: Recent Labs  Lab 02/07/24 0948 02/08/24 0251 02/09/24 0344 02/11/24 0536  NA 141 139  --   --   K 4.3 4.2  --   --   CL 104 107  --   --   CO2 26 23  --   --   GLUCOSE 127* 143*  --   --   BUN 25* 28*  --   --   CREATININE 1.71* 1.35* 1.40* 1.43*  CALCIUM 10.0 9.4  --   --    GFR: Estimated Creatinine Clearance: 27.7 mL/min (A) (by C-G formula based on SCr of 1.43 mg/dL (H)). Liver Function Tests: Recent Labs  Lab 02/07/24 0948 02/08/24 0251  AST 12* 10*  ALT 8 9  ALKPHOS 88 78  BILITOT 0.8 0.6  PROT 7.3 6.2*  ALBUMIN 3.8 2.9*   No results for input(s): LIPASE, AMYLASE in the last 168 hours. No results for input(s): AMMONIA in the last 168 hours. Coagulation Profile: Recent Labs  Lab 02/08/24 0251 02/09/24 0344 02/10/24 0419 02/11/24 0536 02/12/24 0315  INR 1.6* 1.3* 1.2 1.3* 1.8*   Cardiac Enzymes: No results for input(s): CKTOTAL, CKMB, CKMBINDEX, TROPONINI in the last 168 hours. BNP (last 3 results) No results for input(s): PROBNP in the last 8760  hours. HbA1C: No results for input(s): HGBA1C in the last 72 hours. CBG: No results for input(s): GLUCAP in the last 168 hours. Lipid Profile: No results for input(s): CHOL, HDL, LDLCALC, TRIG, CHOLHDL, LDLDIRECT in the last 72 hours. Thyroid  Function Tests: No results for input(s): TSH, T4TOTAL, FREET4, T3FREE, THYROIDAB in the last 72 hours. Anemia Panel: No results for input(s): VITAMINB12, FOLATE, FERRITIN, TIBC, IRON, RETICCTPCT in the last 72 hours. Most Recent Urinalysis On File:     Component Value Date/Time   COLORURINE STRAW (A) 10/12/2023 1627   APPEARANCEUR CLEAR (A) 10/12/2023 1627   LABSPEC 1.008 10/12/2023 1627   PHURINE 6.0 10/12/2023 1627   GLUCOSEU NEGATIVE 10/12/2023 1627   HGBUR NEGATIVE 10/12/2023 1627   BILIRUBINUR NEGATIVE 10/12/2023 1627   KETONESUR NEGATIVE 10/12/2023 1627   PROTEINUR NEGATIVE 10/12/2023 1627   NITRITE NEGATIVE 10/12/2023 1627   LEUKOCYTESUR TRACE (A) 10/12/2023 1627   Sepsis Labs: @LABRCNTIP (procalcitonin:4,lacticidven:4) Microbiology: No results found for this or any previous visit (from the past 240 hours).    Radiology Studies last 3 days: No results found.       Gianpaolo Mindel, DO [facility] 02/12/2024, 5:41 PM    Dictation software may have been used to generate the above note. Typos may occur and escape review in typed/dictated notes. Please contact Dr Marsa directly for clarity if needed.  Staff may message me via secure chat in Epic  but this may not receive an immediate response,  please page me for urgent matters!  If 7PM-7AM, please contact night coverage [email or web address]

## 2024-02-12 NOTE — Evaluation (Signed)
 Occupational Therapy Evaluation Patient Details Name: Roberta Ross MRN: [id number] DOB: August 25, 1933 Today's Date: 02/12/2024   History of Present Illness   Roberta Ross is a pleasant 89 y.o. female with medical history significant for A-fib on Eliquis , HTN, CHF, COPD, DVT on Eliquis  who presented to ED for evaluation of bilateral leg pain.  Patient was diagnosed with DVT recently on right lower extremity.     Clinical Impressions Patient was seen for OT evaluation this date. Prior to hospital admission, patient was living in ALF, reports able to manage dressing, grooming, toileting without A but always had A to take 2 showers weekly, she ambulates with walker. Per chart review patient needs to have rehab prior to returning to ALF. When OT introduced self patient adamantly refusing OT stating it was not necessary, when OT reviewed chart and told patient that [facility] requires she work with therapy/rehab prior to returning, she was more agreeable. Patietn sleeps in recliner at baseline, performed bed mobility with min A. Performed sit<>stand with CGA/min A and ambulated about 15 feet to perform toileting using BSC over toilet, cues for hand placement, required A for clothing management and perineal hygiene to ensure thoroughness. Patient limited by B hand contractures due to RA. She reports at baseline able to manage ADLs but requests assist while here stating it's just easier, OT discussed importance of patient returning to [facility] but patient continues to be hesitant to work with OT.  Patient presents with deficits in standing tolerance, pain and gross strength, affecting safe and optimal ADL completion. Patient is currently requiring mod-max A for basic ADLs.  Paient would benefit from skilled OT services to address noted impairments and functional limitations (see below for any additional details) in order to maximize safety and independence while minimizing future risk of  falls, injury, and readmission. Do anticipate the need for follow up OT services upon acute hospital DC. Patient IV occluded at end of visit, OT notified RN.      If plan is discharge home, recommend the following:   A little help with walking and/or transfers;A little help with bathing/dressing/bathroom;Assistance with cooking/housework;Assistance with feeding     Functional Status Assessment   Patient has had a recent decline in their functional status and demonstrates the ability to make significant improvements in function in a reasonable and predictable amount of time.     Equipment Recommendations   Other (comment) (defer to next venue of care)     Recommendations for Other Services         Precautions/Restrictions   Precautions Precautions: Fall Restrictions Weight Bearing Restrictions Per Provider Order: No     Mobility Bed Mobility Overal bed mobility: Needs Assistance Bed Mobility: Supine to Sit, Sit to Supine Rolling: Min assist Sidelying to sit: Min assist Supine to sit: Min assist Sit to supine: Min assist        Transfers Overall transfer level: Needs assistance Equipment used: Rolling walker (2 wheels) Transfers: Sit to/from Stand Sit to Stand: Min assist           General transfer comment: cues for hand placement, used rolling walker      Balance Overall balance assessment: Needs assistance Sitting-balance support: Bilateral upper extremity supported, Feet supported Sitting balance-Leahy Scale: Good     Standing balance support: Bilateral upper extremity supported, Reliant on assistive device for balance Standing balance-Leahy Scale: Fair  ADL either performed or assessed with clinical judgement   ADL Overall ADL's : Needs assistance/impaired Eating/Feeding: Set up   Grooming: Set up   Upper Body Bathing: Minimal assistance   Lower Body Bathing: Maximal assistance;Moderate  assistance   Upper Body Dressing : Minimal assistance   Lower Body Dressing: Maximal assistance   Toilet Transfer: Minimal assistance   Toileting- Clothing Manipulation and Hygiene: Moderate assistance       Functional mobility during ADLs: Minimal assistance       Vision         Perception         Praxis         Pertinent Vitals/Pain Pain Assessment Pain Assessment: 0-10 Pain Score: 7  Pain Location: right leg Pain Descriptors / Indicators: Constant, Aching Pain Intervention(s): Monitored during session     Extremity/Trunk Assessment Upper Extremity Assessment Upper Extremity Assessment: RUE deficits/detail;LUE deficits/detail RUE Deficits / Details: significant MCP ulnar drift due to RA LUE Deficits / Details: significant MCP ulnar drift due to RA   Lower Extremity Assessment Lower Extremity Assessment: Defer to PT evaluation       Communication Communication Communication: No apparent difficulties Factors Affecting Communication: Hearing impaired   Cognition Arousal: Alert Behavior During Therapy: WFL for tasks assessed/performed Cognition: No apparent impairments                               Following commands: Intact       Cueing  General Comments   Cueing Techniques: Verbal cues      Exercises     Shoulder Instructions      Home Living Family/patient expects to be discharged to:: Assisted living                             Home Equipment: None          Prior Functioning/Environment Prior Level of Function : Needs assist               ADLs Comments: lives at ALF, reports she was able to do basic ADLs without A however someone assists her with showers    OT Problem List: Decreased strength;Decreased range of motion;Decreased activity tolerance;Impaired balance (sitting and/or standing);Decreased safety awareness;Decreased knowledge of precautions;Decreased knowledge of use of DME or AE   OT  Treatment/Interventions: Self-care/ADL training;Therapeutic exercise;Energy conservation;DME and/or AE instruction;Therapeutic activities;Patient/family education;Balance training      OT Goals(Current goals can be found in the care plan section)   Acute Rehab OT Goals Patient Stated Goal: to have more energy OT Goal Formulation: With patient Time For Goal Achievement: 02/26/24 Potential to Achieve Goals: Good ADL Goals Pt Will Perform Grooming: with modified independence;sitting;standing Pt Will Perform Upper Body Dressing: with modified independence;sitting;standing Pt Will Perform Lower Body Dressing: with modified independence;sitting/lateral leans;sit to/from stand Pt Will Perform Toileting - Clothing Manipulation and hygiene: with modified independence;sit to/from stand   OT Frequency:  Min 2X/week    Co-evaluation              AM-PAC OT 6 Clicks Daily Activity     Outcome Measure Help from another person eating meals?: A Little Help from another person taking care of personal grooming?: A Little Help from another person toileting, which includes using toliet, bedpan, or urinal?: A Lot Help from another person bathing (including washing, rinsing, drying)?: A Lot Help from another person to  put on and taking off regular upper body clothing?: A Little Help from another person to put on and taking off regular lower body clothing?: A Lot 6 Click Score: 15   End of Session Equipment Utilized During Treatment: Rolling walker (2 wheels) Nurse Communication: Other (comment) (IV occluded)  Activity Tolerance: Patient tolerated treatment well Patient left: in bed;with call bell/phone within reach;with bed alarm set  OT Visit Diagnosis: Unsteadiness on feet (R26.81);Other abnormalities of gait and mobility (R26.89)                Time: 0925-1001 OT Time Calculation (min): 36 min Charges:  OT General Charges $OT Visit: 1 Visit OT Evaluation $OT Eval Low Complexity: 1  Low OT Treatments $Self Care/Home Management : 23-37 mins  Rogers Clause, OT/L MSOT, 02/12/2024

## 2024-02-12 NOTE — NC FL2 (Signed)
 [place]  MEDICAID FL2 LEVEL OF CARE FORM     IDENTIFICATION  Patient Name: Roberta Ross Birthdate: 1933/12/09 Sex: female Admission Date (Current Location): 02/07/2024  [facility] and [place] Number:  Chiropodist and Address:  [facility], [address], [place], [place] [address]      Provider Number: [id number]  Attending Physician Name and Address:  Marsa Edelman, DO  Relative Name and Phone Number:       Current Level of Care: SNF Recommended Level of Care: Skilled Nursing Facility Prior Approval Number:    Date Approved/Denied:   PASRR Number: [id number]  Discharge Plan: SNF    Current Diagnoses: Patient Active Problem List   Diagnosis Date Noted   Acute metabolic encephalopathy 02/10/2024   Acute gout 02/09/2024   Acute kidney injury superimposed on CKD 02/08/2024   Pressure injury of skin 02/08/2024   Decubitus ulcer of right buttock, stage 2 (HCC) 02/08/2024   Acute DVT (deep venous thrombosis) (HCC) 02/07/2024   DVT (deep venous thrombosis) (HCC) 01/26/2024   Abdominal pain 02/26/2023   Mycoplasma pneumoniae pneumonia 02/22/2023   COVID-19 virus infection 02/15/2023   COVID 02/15/2023   Rheumatoid arthritis (HCC) 01/24/2022   CAP (community acquired pneumonia) 01/21/2022   Sepsis (HCC) secondary to community-acquired pneumonia and UTI 01/21/2022   Acute on chronic systolic CHF (congestive heart failure) (HCC) 01/21/2022   UTI (urinary tract infection) 01/21/2022   HLD (hyperlipidemia) 01/21/2022   Chronic kidney disease, stage 3b (HCC) 01/21/2022   Sepsis due to pneumonia (HCC)    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   Closed fracture of femur, intertrochanteric, left, initial encounter (HCC) 03/27/2020   Accidental fall 03/27/2020   Preoperative clearance 03/27/2020   Closed left hip fracture, initial encounter (HCC) 03/27/2020   Simple chronic bronchitis (HCC) 09/01/2018   Atrial  fibrillation, chronic (HCC) 09/16/2017   History of subarachnoid hemorrhage 07/21/2017   Prediabetes 03/21/2017   GAD (generalized anxiety disorder) 02/01/2016   Hypercalcemia 02/01/2016   Essential hypertension 01/04/2015   Stage 3b chronic kidney disease (HCC) 08/24/2014   Pure hypercholesterolemia 02/09/2014   H/O rheumatoid arthritis 10/20/2013    Orientation RESPIRATION BLADDER Height & Weight     Self, Time, Situation, Place  Normal Continent Weight: 160 lb 4.4 oz (72.7 kg) Height:  5' 5 (165.1 cm)  BEHAVIORAL SYMPTOMS/MOOD NEUROLOGICAL BOWEL NUTRITION STATUS      Continent Diet (Regular)  AMBULATORY STATUS COMMUNICATION OF NEEDS Skin   Extensive Assist Verbally PU Stage and Appropriate Care (Pressure injury Buttocks)                       Personal Care Assistance Level of Assistance  Bathing, Feeding, Dressing Bathing Assistance: Limited assistance Feeding assistance: Independent Dressing Assistance: Limited assistance     Functional Limitations Info  Hearing, Sight, Speech Sight Info: Impaired Hearing Info: Adequate Speech Info: Adequate    SPECIAL CARE FACTORS FREQUENCY  PT (By licensed PT), OT (By licensed OT)     PT Frequency: 5x/week OT Frequency: 5x/week            Contractures      Additional Factors Info  Code Status, Allergies Code Status Info: DNR Limited Allergies Info: Metoprolol, Penicillins, Methotrexate And Trimetrexate, Doxycycline Hyclate           Current Medications (02/12/2024):  This is the current hospital active medication list Current Facility-Administered Medications  Medication Dose Route Frequency Provider Last Rate Last Admin  albuterol  (PROVENTIL ) (2.5 MG/3ML) 0.083% nebulizer solution 3 mL  3 mL Inhalation Q4H PRN Roann Gouty, MD   3 mL at 02/10/24 0812   allopurinol (ZYLOPRIM) tablet 50 mg  50 mg Oral Daily Josette Ade, MD   50 mg at 02/12/24 0916   diclofenac Sodium (VOLTAREN) 1 % topical gel 4 g  4 g  Topical QID Paudel, Keshab, MD   4 g at 02/12/24 9082   diltiazem  (CARDIZEM  LA) 24 hr tablet 240 mg  240 mg Oral Daily Paudel, Gouty, MD   240 mg at 02/12/24 0916   folic acid (FOLVITE) tablet 0.5 mg  500 mcg Oral q AM Paudel, Keshab, MD   0.5 mg at 02/12/24 0915   heparin  ADULT infusion 100 units/mL (25000 units/250mL)  1,100 Units/hr Intravenous Continuous Perley Lum DEL, RPH 11 mL/hr at 02/12/24 0552 1,100 Units/hr at 02/12/24 0552   melatonin tablet 5 mg  5 mg Oral QHS Wieting, Richard, MD   5 mg at 02/11/24 2239   morphine  (PF) 2 MG/ML injection 2 mg  2 mg Intravenous Q4H PRN Paudel, Keshab, MD       ondansetron  (ZOFRAN ) tablet 4 mg  4 mg Oral Q6H PRN Paudel, Keshab, MD       Or   ondansetron  (ZOFRAN ) injection 4 mg  4 mg Intravenous Q6H PRN Paudel, Gouty, MD       oxyCODONE  (Oxy IR/ROXICODONE ) immediate release tablet 5 mg  5 mg Oral Q4H PRN Paudel, Keshab, MD   5 mg at 02/11/24 1849   polyethylene glycol (MIRALAX  / GLYCOLAX ) packet 17 g  17 g Oral Daily PRN Paudel, Keshab, MD       predniSONE (DELTASONE) tablet 40 mg  40 mg Oral Q breakfast Josette Ade, MD   40 mg at 02/12/24 0916   senna-docusate (Senokot-S) tablet 2 tablet  2 tablet Oral BID Paudel, Keshab, MD   2 tablet at 02/12/24 0915   sertraline (ZOLOFT) tablet 50 mg  50 mg Oral Daily Paudel, Keshab, MD   50 mg at 02/12/24 0915   simvastatin  (ZOCOR ) tablet 10 mg  10 mg Oral QPM Paudel, Keshab, MD   10 mg at 02/11/24 1811   torsemide  (DEMADEX ) tablet 20 mg  20 mg Oral QODAY Paudel, Gouty, MD   20 mg at 02/12/24 0915   warfarin (COUMADIN) tablet 6 mg  6 mg Oral ONCE-1600 Lenon Elsie HERO, [facility]       Warfarin - Pharmacist Dosing Inpatient   Does not apply q1600 Perley Lum DEL, [facility]   Given at 02/11/24 1617     Discharge Medications: Please see discharge summary for a list of discharge medications.  Relevant Imaging Results:  Relevant Lab Results:   Additional Information SSN: [id number]  Bailey Kolbe  [place],  LCSW

## 2024-02-12 NOTE — Progress Notes (Signed)
 Physical Therapy Treatment Patient Details Name: Roberta Ross MRN: [id number] DOB: March 30, 1934 Today's Date: 02/12/2024   History of Present Illness Roberta Ross is a pleasant 89 y.o. female with medical history significant for A-fib on Eliquis , HTN, CHF, COPD, DVT on Eliquis  who presented to ED for evaluation of bilateral leg pain.  Patient was diagnosed with DVT recently on right lower extremity.    PT Comments  Pt was asleep in bed upon arrival. She easily awakes and is cooperative and pleasant. BP 98/66(77) while seated EOB. No symptoms of dizziness or lightheadedness throughout session. She tolerated standing and ambulating ~ 25 ft with RW. No LOB however pt self limits distance due to fatigue. Overall, pt is progressing well. Acute PT will continue to follow and progress per current POC. Pt plans to return to ALF with HHPT once cleared medically.    If plan is discharge home, recommend the following: A little help with walking and/or transfers;A little help with bathing/dressing/bathroom;Assistance with cooking/housework;Help with stairs or ramp for entrance;Assist for transportation     Equipment Recommendations  None recommended by PT (pt endorses having RW and BSC already)       Precautions / Restrictions Precautions Precautions: Fall Recall of Precautions/Restrictions: Intact Precaution/Restrictions Comments: on heparin  Iv Restrictions Weight Bearing Restrictions Per Provider Order: No     Mobility  Bed Mobility Overal bed mobility: Needs Assistance Bed Mobility: Supine to Sit, Sit to Supine Rolling: Min assist Sidelying to sit: Min assist Supine to sit: Min assist Sit to supine: Min assist    Transfers Overall transfer level: Needs assistance Equipment used: Rolling walker (2 wheels) Transfers: Sit to/from Stand Sit to Stand: Min assist     Ambulation/Gait Ambulation/Gait assistance: Contact guard assist, Min assist Gait Distance (Feet):  25 Feet Assistive device: Rolling walker (2 wheels) Gait Pattern/deviations: Step-through pattern Gait velocity: dec  General Gait Details: Pt did tolerate ambulation to doorway of room and back. No LOB but distance limited by fatigue.   Balance Overall balance assessment: Needs assistance Sitting-balance support: Bilateral upper extremity supported, Feet supported Sitting balance-Leahy Scale: Good     Standing balance support: Bilateral upper extremity supported Standing balance-Leahy Scale: Fair       Hotel manager: Impaired Factors Affecting Communication: Hearing impaired  Cognition Arousal: Alert Behavior During Therapy: WFL for tasks assessed/performed   PT - Cognitive impairments: No family/caregiver present to determine baseline    PT - Cognition Comments: Pt is A and O x 2. Pleasant and extremely motivated Following commands: Intact      Cueing Cueing Techniques: Verbal cues         Pertinent Vitals/Pain Pain Assessment Pain Assessment: 0-10 Pain Score: 3  Pain Location: right leg Pain Descriptors / Indicators: Constant, Aching Pain Intervention(s): Limited activity within patient's tolerance, Monitored during session, Premedicated before session, Repositioned     PT Goals (current goals can now be found in the care plan section) Acute Rehab PT Goals Patient Stated Goal: Go back to [facility] tomorrow. Progress towards PT goals: Progressing toward goals    Frequency    Min 3X/week       AM-PAC PT 6 Clicks Mobility   Outcome Measure  Help needed turning from your back to your side while in a flat bed without using bedrails?: None Help needed moving from lying on your back to sitting on the side of a flat bed without using bedrails?: A Little Help needed moving to and from a bed to a chair (  including a wheelchair)?: A Little Help needed standing up from a chair using your arms (e.g., wheelchair or bedside chair)?: A  Little Help needed to walk in hospital room?: A Little Help needed climbing 3-5 steps with a railing? : A Lot 6 Click Score: 18    End of Session   Activity Tolerance: Patient tolerated treatment well;Patient limited by fatigue Patient left: in bed;with call bell/phone within reach;with bed alarm set Nurse Communication: Mobility status PT Visit Diagnosis: Other abnormalities of gait and mobility (R26.89);History of falling (Z91.81);Muscle weakness (generalized) (M62.81)     Time: 8469-8455 PT Time Calculation (min) (ACUTE ONLY): 14 min  Charges:    $Therapeutic Activity: 8-22 mins PT General Charges $$ ACUTE PT VISIT: 1 Visit                    Rankin Essex PTA 02/12/24, 4:04 PM

## 2024-02-13 ENCOUNTER — Other Ambulatory Visit: Payer: Self-pay

## 2024-02-13 ENCOUNTER — Other Ambulatory Visit (HOSPITAL_COMMUNITY): Payer: Self-pay

## 2024-02-13 DIAGNOSIS — I82411 Acute embolism and thrombosis of right femoral vein: Secondary | ICD-10-CM | POA: Diagnosis not present

## 2024-02-13 LAB — CBC
HCT: 34.8 % — ABNORMAL LOW (ref 36.0–46.0)
Hemoglobin: 11.4 g/dL — ABNORMAL LOW (ref 12.0–15.0)
MCH: 32 pg (ref 26.0–34.0)
MCHC: 32.8 g/dL (ref 30.0–36.0)
MCV: 97.8 fL (ref 80.0–100.0)
Platelets: 235 K/uL (ref 150–400)
RBC: 3.56 MIL/uL — ABNORMAL LOW (ref 3.87–5.11)
RDW: 13 % (ref 11.5–15.5)
WBC: 9.6 K/uL (ref 4.0–10.5)
nRBC: 0 % (ref 0.0–0.2)

## 2024-02-13 LAB — PROTIME-INR
INR: 2.3 — ABNORMAL HIGH (ref 0.8–1.2)
Prothrombin Time: 26.7 s — ABNORMAL HIGH (ref 11.4–15.2)

## 2024-02-13 LAB — MRSA NEXT GEN BY PCR, NASAL: MRSA by PCR Next Gen: NOT DETECTED

## 2024-02-13 LAB — HEPARIN LEVEL (UNFRACTIONATED): Heparin Unfractionated: 0.6 [IU]/mL (ref 0.30–0.70)

## 2024-02-13 MED ORDER — WARFARIN SODIUM 2 MG PO TABS
4.0000 mg | ORAL_TABLET | Freq: Every day | ORAL | 0 refills | Status: DC
  Filled 2024-02-13: qty 60, 30d supply, fill #0

## 2024-02-13 MED ORDER — ALLOPURINOL 100 MG PO TABS
50.0000 mg | ORAL_TABLET | Freq: Every day | ORAL | 0 refills | Status: AC
Start: 2024-02-14 — End: ?
  Filled 2024-02-13: qty 15, 30d supply, fill #0

## 2024-02-13 MED ORDER — MELATONIN 5 MG PO TABS
5.0000 mg | ORAL_TABLET | Freq: Every day | ORAL | 0 refills | Status: AC
  Filled 2024-02-13: qty 30, 30d supply, fill #0

## 2024-02-13 MED ORDER — WARFARIN SODIUM 4 MG PO TABS
4.0000 mg | ORAL_TABLET | Freq: Once | ORAL | Status: AC
  Administered 2024-02-13: 4 mg via ORAL
  Filled 2024-02-13: qty 1

## 2024-02-13 MED ORDER — OXYCODONE HCL 5 MG PO TABS
2.5000 mg | ORAL_TABLET | Freq: Four times a day (QID) | ORAL | 0 refills | Status: DC | PRN
  Filled 2024-02-13: qty 10, 3d supply, fill #0

## 2024-02-13 NOTE — Progress Notes (Signed)
 Nurse went into do the dressing change as ordered.  Patient requested that it be done later in the day.  Nurse will pass along to the oncoming shift.

## 2024-02-13 NOTE — [place] FL2 (Signed)
 [place]  MEDICAID FL2 LEVEL OF CARE FORM     IDENTIFICATION  Patient Name: Roberta Ross Birthdate: Jan 23, 1934 Sex: female Admission Date (Current Location): 02/07/2024  [facility] and [place] Number:  Chiropodist and Address:  [facility], [address] [place], [place] [address]      Provider Number: [id number]  Attending Physician Name and Address:  Marsa Edelman, DO  Relative Name and Phone Number:  Patient & (Daughter) [name] Chancy  [phone]    Current Level of Care: SNF Recommended Level of Care: Assisted Living Facility Prior Approval Number:    Date Approved/Denied:   PASRR Number: [id number]  Discharge Plan: Other (Comment) (Return to ALF)    Current Diagnoses: Patient Active Problem List   Diagnosis Date Noted   Acute metabolic encephalopathy 02/10/2024   Acute gout 02/09/2024   Acute kidney injury superimposed on CKD 02/08/2024   Pressure injury of skin 02/08/2024   Decubitus ulcer of right buttock, stage 2 (HCC) 02/08/2024   Acute DVT (deep venous thrombosis) (HCC) 02/07/2024   DVT (deep venous thrombosis) (HCC) 01/26/2024   Abdominal pain 02/26/2023   Mycoplasma pneumoniae pneumonia 02/22/2023   COVID-19 virus infection 02/15/2023   COVID 02/15/2023   Rheumatoid arthritis (HCC) 01/24/2022   CAP (community acquired pneumonia) 01/21/2022   Sepsis (HCC) secondary to community-acquired pneumonia and UTI 01/21/2022   Acute on chronic systolic CHF (congestive heart failure) (HCC) 01/21/2022   UTI (urinary tract infection) 01/21/2022   HLD (hyperlipidemia) 01/21/2022   Chronic kidney disease, stage 3b (HCC) 01/21/2022   Sepsis due to pneumonia (HCC)    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   Closed fracture of femur, intertrochanteric, left, initial encounter (HCC) 03/27/2020   Accidental fall 03/27/2020   Preoperative clearance 03/27/2020   Closed left hip fracture, initial encounter (HCC)  03/27/2020   Simple chronic bronchitis (HCC) 09/01/2018   Atrial fibrillation, chronic (HCC) 09/16/2017   History of subarachnoid hemorrhage 07/21/2017   Prediabetes 03/21/2017   GAD (generalized anxiety disorder) 02/01/2016   Hypercalcemia 02/01/2016   Essential hypertension 01/04/2015   Stage 3b chronic kidney disease (HCC) 08/24/2014   Pure hypercholesterolemia 02/09/2014   H/O rheumatoid arthritis 10/20/2013    Orientation RESPIRATION BLADDER Height & Weight     Self, Time, Situation, Place  Normal Continent Weight: 72.7 kg Height:  5' 5 (165.1 cm)  BEHAVIORAL SYMPTOMS/MOOD NEUROLOGICAL BOWEL NUTRITION STATUS     (N/A) Continent Diet (Regular with thin liquids)  AMBULATORY STATUS COMMUNICATION OF NEEDS Skin   Limited Assist Verbally Other (Comment) (Per Nursing pt has Pressure injury Buttocks)                       Personal Care Assistance Level of Assistance  Bathing, Feeding, Dressing, Total care Bathing Assistance: Limited assistance Feeding assistance: Independent Dressing Assistance: Limited assistance Total Care Assistance: Limited assistance   Functional Limitations Info  Hearing, Sight, Speech Sight Info: Impaired Hearing Info: Adequate Speech Info: Adequate    SPECIAL CARE FACTORS FREQUENCY  PT (By licensed PT), OT (By licensed OT)     PT Frequency: 2-3 x a wk OT Frequency: 2-3 x a wk            Contractures Contractures Info: Not present    Additional Factors Info  Code Status Code Status Info: DNR Allergies Info: Metoprolol, Penicillins, Methotrexate And Trimetrexate, Doxycycline Hyclate           Current Medications (02/13/2024):  This is the  current hospital active medication list Current Facility-Administered Medications  Medication Dose Route Frequency Provider Last Rate Last Admin   albuterol  (PROVENTIL ) (2.5 MG/3ML) 0.083% nebulizer solution 3 mL  3 mL Inhalation Q4H PRN Roann Gouty, MD   3 mL at 02/10/24 0812   allopurinol  (ZYLOPRIM) tablet 50 mg  50 mg Oral Daily Josette Ade, MD   50 mg at 02/13/24 9157   diclofenac Sodium (VOLTAREN) 1 % topical gel 4 g  4 g Topical QID Paudel, Keshab, MD   4 g at 02/13/24 9157   diltiazem  (CARDIZEM  LA) 24 hr tablet 240 mg  240 mg Oral Daily Paudel, Gouty, MD   240 mg at 02/13/24 0930   folic acid (FOLVITE) tablet 0.5 mg  500 mcg Oral q AM Paudel, Keshab, MD   0.5 mg at 02/13/24 0840   heparin  ADULT infusion 100 units/mL (25000 units/250mL)  1,100 Units/hr Intravenous Continuous Perley Lum DEL, RPH 11 mL/hr at 02/13/24 0720 1,100 Units/hr at 02/13/24 0720   melatonin tablet 5 mg  5 mg Oral QHS Wieting, Richard, MD   5 mg at 02/12/24 2159   morphine  (PF) 2 MG/ML injection 2 mg  2 mg Intravenous Q4H PRN Paudel, Keshab, MD       ondansetron  (ZOFRAN ) tablet 4 mg  4 mg Oral Q6H PRN Paudel, Keshab, MD       Or   ondansetron  (ZOFRAN ) injection 4 mg  4 mg Intravenous Q6H PRN Paudel, Gouty, MD       oxyCODONE  (Oxy IR/ROXICODONE ) immediate release tablet 5 mg  5 mg Oral Q4H PRN Paudel, Keshab, MD   5 mg at 02/11/24 1849   polyethylene glycol (MIRALAX  / GLYCOLAX ) packet 17 g  17 g Oral Daily PRN Paudel, Keshab, MD       predniSONE (DELTASONE) tablet 40 mg  40 mg Oral Q breakfast Josette, Richard, MD   40 mg at 02/13/24 0841   senna-docusate (Senokot-S) tablet 2 tablet  2 tablet Oral BID Paudel, Keshab, MD   2 tablet at 02/13/24 0841   sertraline (ZOLOFT) tablet 50 mg  50 mg Oral Daily Paudel, Keshab, MD   50 mg at 02/13/24 0841   simvastatin  (ZOCOR ) tablet 10 mg  10 mg Oral QPM Paudel, Keshab, MD   10 mg at 02/12/24 1716   torsemide  (DEMADEX ) tablet 20 mg  20 mg Oral QODAY Paudel, Gouty, MD   20 mg at 02/12/24 0915   warfarin (COUMADIN) tablet 4 mg  4 mg Oral ONCE-1600 Lenon Elsie HERO, [facility]       Warfarin - Pharmacist Dosing Inpatient   Does not apply q1600 Perley Lum DEL, [facility]   Given at 02/12/24 8279     Discharge Medications: Please see discharge summary for a list of  discharge medications.  Relevant Imaging Results:  Relevant Lab Results:   Additional Information SSN: [id number]  Marinda Cooks, RN

## 2024-02-13 NOTE — Progress Notes (Signed)
 Occupational Therapy Treatment Patient Details Name: Roberta Ross MRN: [id number] DOB: 28-Jan-1934 Today's Date: 02/13/2024   History of present illness Roberta Ross is a pleasant 89 y.o. female with medical history significant for A-fib on Eliquis , HTN, CHF, COPD, DVT on Eliquis  who presented to ED for evaluation of bilateral leg pain.  Patient was diagnosed with DVT recently on right lower extremity.   OT comments  Patient seen for OT treatment on this date. Upon arrival to room patient supine in bed, reports she is going home today (no DC orders noted), agreeable to treatment. Patient currently using purewick, discussed removing purewick to prep for dc to ALF (patient states she can have therapy there), patient agreeable and RN present for conversation. Patient performed bed mobility with mod A for scooting, patient performed sit<>stand from EOB with min A with r/w, patient ambulated with r/w to bathroom with CGA and performed toileting with min A for thorough hygiene, patient was able to successfully void. Patient able to stand at sink for 2 minutes to perform grooming tasks before becoming fatigued, she ambulated back and was agreeable to sit up in recliner to improve activity tolerance.  Patient ended treatment in chair with bed/chair alarm on and all needs within reach. Patient making good progress toward goals, will continue to follow POC. Discharge recommendation remains appropriate.        If plan is discharge home, recommend the following:  A little help with walking and/or transfers;A little help with bathing/dressing/bathroom;Assistance with cooking/housework;Assistance with feeding   Equipment Recommendations  Other (comment) (defer to next venue)    Recommendations for Other Services      Precautions / Restrictions Precautions Precautions: Fall Recall of Precautions/Restrictions: Intact Precaution/Restrictions Comments: on heparin   Iv Restrictions Weight Bearing Restrictions Per Provider Order: No       Mobility Bed Mobility Overal bed mobility: Needs Assistance Bed Mobility: Supine to Sit       Sit to supine: Mod assist   General bed mobility comments: a to scoot EOB    Transfers Overall transfer level: Needs assistance Equipment used: Rolling walker (2 wheels) Transfers: Sit to/from Stand Sit to Stand: Min assist, Contact guard assist           General transfer comment: cues for hand placement     Balance Overall balance assessment: Needs assistance Sitting-balance support: Bilateral upper extremity supported, Feet supported Sitting balance-Leahy Scale: Good     Standing balance support: Bilateral upper extremity supported Standing balance-Leahy Scale: Fair                             ADL either performed or assessed with clinical judgement   ADL Overall ADL's : Needs assistance/impaired     Grooming: Supervision/safety Grooming Details (indicate cue type and reason): washed hands while standing at sink                 Toilet Transfer: Contact guard assist;Comfort height toilet Toilet Transfer Details (indicate cue type and reason): BSC set up over toilet Toileting- Clothing Manipulation and Hygiene: Contact guard assist       Functional mobility during ADLs: Contact guard assist;Minimal assistance      Extremity/Trunk Assessment Upper Extremity Assessment Upper Extremity Assessment: RUE deficits/detail;LUE deficits/detail RUE Deficits / Details: significant MCP ulnar drift due to RA            Vision       Perception  Praxis     Communication Communication Communication: Impaired Factors Affecting Communication: Hearing impaired   Cognition Arousal: Alert Behavior During Therapy: WFL for tasks assessed/performed Cognition: No apparent impairments                               Following commands: Intact Following commands  impaired: Follows multi-step commands with increased time      Cueing   Cueing Techniques: Verbal cues  Exercises      Shoulder Instructions       General Comments      Pertinent Vitals/ Pain       Pain Assessment Pain Assessment: 0-10 Pain Score: 6  Pain Location: both hands Pain Descriptors / Indicators: Aching, Dull Pain Intervention(s): Monitored during session  Home Living                                          Prior Functioning/Environment              Frequency  Min 2X/week        Progress Toward Goals  OT Goals(current goals can now be found in the care plan section)  Progress towards OT goals: Progressing toward goals  Acute Rehab OT Goals Patient Stated Goal: be able to use the bathroom OT Goal Formulation: With patient Time For Goal Achievement: 02/26/24 Potential to Achieve Goals: Good ADL Goals Pt Will Perform Grooming: with modified independence;sitting;standing Pt Will Perform Upper Body Dressing: with modified independence;sitting;standing Pt Will Perform Lower Body Dressing: with modified independence;sitting/lateral leans;sit to/from stand Pt Will Perform Toileting - Clothing Manipulation and hygiene: with modified independence;sit to/from stand  Plan      Co-evaluation                 AM-PAC OT 6 Clicks Daily Activity     Outcome Measure   Help from another person eating meals?: A Little Help from another person taking care of personal grooming?: A Little Help from another person toileting, which includes using toliet, bedpan, or urinal?: A Lot Help from another person bathing (including washing, rinsing, drying)?: A Lot Help from another person to put on and taking off regular upper body clothing?: A Little Help from another person to put on and taking off regular lower body clothing?: A Lot 6 Click Score: 15    End of Session Equipment Utilized During Treatment: Rolling walker (2 wheels)  OT  Visit Diagnosis: Unsteadiness on feet (R26.81);Other abnormalities of gait and mobility (R26.89)   Activity Tolerance Patient tolerated treatment well   Patient Left in chair;with call bell/phone within reach;with chair alarm set   Nurse Communication Other (comment) (remove purewick)        Time: 9074-9049 OT Time Calculation (min): 25 min  Charges: OT General Charges $OT Visit: 1 Visit OT Treatments $Self Care/Home Management : 23-37 mins  Rogers Clause, OT/L MSOT, 02/13/2024

## 2024-02-13 NOTE — TOC Transition Note (Signed)
 Transition of Care [facility]) - Discharge Note   Patient Details  Name: Roberta Ross MRN: [id number] Date of Birth: 1933/11/16  Transition of Care [facility]) CM/SW Contact:  Marinda Cooks, RN Phone Number: 02/13/2024, 2:28 PM   Clinical Narrative:    This CM updated by covering MD pt medically cleared to dc today and has active DC order . HH arranged with  [facility] to follow pt's PT/INR  Spoke with  Channing and confirmed.DME coordinated with [facility] spoke with Mitch and confirmed to have it delivered to ALF. Pt's  Coumadin meds delivered  to bedside prior to dc  per request of ALF. This CM confirmed with admission Nurse Justice that pt can be rec'd at ALF and she rec'd everything needed for pt. DC transportation confirmed for pt with [facility] team updated .No additional DC needs requested by medical team or identified by CM at this time .    Final next level of care: [facility] [address] [place], [place] [address]) Barriers to Discharge: No Barriers Identified   Patient Goals and CMS Choice   CMS Medicare.gov Compare Post Acute Care list provided to:: Patient Choice offered to / list presented to : Patient      Discharge Placement  [facility]F [facility]                 Name of family member notified: patient & daughter Patient and family notified of of transfer: 02/13/24  Discharge Plan and Services Additional resources added to the After Visit Summary for                  DME Arranged: 3-N-1 DME Agency: [facility] Date DME Agency Contacted: 02/13/24 Time DME Agency Contacted: 1420   HH Arranged: RN, PT, OT (RN to manage Pt/INR needs at [place]) [facility] Agency: [facility] Date [facility] Agency Contacted: 02/13/24 Time HH Agency Contacted: 1023 Representative spoke with at [facility] Agency: Channing  Social Drivers of Health (SDOH) Interventions SDOH Screenings   Food Insecurity: No Food Insecurity (02/07/2024)  Housing: Low Risk   (02/07/2024)  Transportation Needs: No Transportation Needs (02/07/2024)  Utilities: Not At Risk (02/07/2024)  Social Connections: Socially Isolated (02/07/2024)  Tobacco Use: Medium Risk (02/07/2024)     Readmission Risk Interventions     No data to display

## 2024-02-13 NOTE — Consult Note (Signed)
 Pharmacy Consult Note - Anticoagulation  Pharmacy Consult for warfarin with heparin  bridge Indication: DVT  PATIENT MEASUREMENTS: Height: 5' 5 (165.1 cm) Weight: 72.7 kg (160 lb 4.4 oz) IBW/kg (Calculated) : 57 HEPARIN  DW (KG): 71.7  VITAL SIGNS: Temp: 98.2 F (36.8 C) (10/16 0354) Temp Source: Oral (10/15 1923) BP: 102/64 (10/16 0354) Pulse Rate: 65 (10/16 0354)  Recent Labs    02/11/24 0536 02/12/24 0315 02/13/24 0345  HGB 11.2* 11.2* 11.4*  HCT 35.1* 34.3* 34.8*  PLT 250 230 235  APTT 80* 96*  --   LABPROT 16.6* 21.4* 26.7*  INR 1.3* 1.8* 2.3*  HEPARINUNFRC 0.69 0.64 0.60  CREATININE 1.43*  --   --     Estimated Creatinine Clearance: 27.7 mL/min (A) (by C-G formula based on SCr of 1.43 mg/dL (H)).  PAST MEDICAL HISTORY: Past Medical History:  Diagnosis Date   Anemia    past hx   Arthritis    hands and feet   Chronic kidney disease    COPD (chronic obstructive pulmonary disease) (HCC)    Dyspnea    Dysrhythmia    Pt reports slow HR and papatations   Heart murmur    past hx   Hypercholesteremia    Hypertension    Rheumatic fever    (x2) in past   Wears dentures    full upper    ASSESSMENT: 89 y.o. female with PMH including recent DVT diagnosis is presenting with worsening DVT. She had been prescribed Eliquis  for DVT diagnosed 01/03/2024 but per discussions between RN and EDP, her facility had not been giving it regularly -- last dose unknown. Pharmacy has been consulted to initiate and manage heparin  intravenous infusion.   10/11: Per Dr. Josette, team is considering this an apixaban  failure and are transitioning patient to warfarin. INR goal 2-3. Heparin  continued as a bridge.  Heparin  Assessment: 10/10:  Heparin  gtt and bolus given @ 1524 10/10: HL @ 1536 = > 1.10 (not true baseline b/c drawn after heparin  started) 10/10: HL @ 1753 = > 1.10 10/10: aPTT @ 1923 = 185, elevated  10/11: 0707 aPTT: 84 HL > 1.1 10/12: 0344 aPTT: 68  HL > 1.10 10/12:  1426 aPTT: 75 10/13: 0419 aPTT: 73   HL > 1.10  Warfarin Assessment: Date INR Warfarin Dose  10/11 1.6 2.5 mg   10/12 1.3 3 mg   10/13 1.2 4 mg  10/14 1.3 6 mg  10/15 1.8 6 mg  10/16 2.3      Pertinent medications: Has not been taking Eliquis  as prescribed  Goal(s) of therapy: Heparin  level 0.3 - 0.7 units/mL aPTT 66 - 102 seconds INR 2 - 3 Monitor platelets by anticoagulation protocol: Yes   Baseline anticoagulation labs: Recent Labs    02/11/24 0536 02/12/24 0315 02/13/24 0345  APTT 80* 96*  --   INR 1.3* 1.8* 2.3*  HGB 11.2* 11.2* 11.4*  PLT 250 230 235  Baseline heparin   ordered Will order baseline anti-Xa level to assess for false elevation.   Heparin  PLAN: 10/16 @ 0345:  HL = 0.60 - HL therapeutic X 3 - Continuing pt on current rate and recheck HL on 10/17 with AM labs - CBC daily while on heparin .  - Discontinue heparin  when INR therapeutic x 2.  Warfarin PLAN: INR still subtherapeutic but trending encouragingly. Patient ate less than half of her meals yesterday per chart -- will continue to monitor. Also balancing that patient is older age, with low albumin, and baseline INR  was >1.4. Give warfarin 6 mg PO x 1 again today Daily INR. CBC daily while on heparin .   Rankin CANDIE Dills, PharmD, MBA 02/13/2024 5:03 AM

## 2024-02-13 NOTE — TOC CM/SW Note (Signed)
 Patient is not able to walk the distance required to go the bathroom, or he/she is unable to safely negotiate stairs required to access the bathroom.  A 3in1 BSC will alleviate this problem

## 2024-02-13 NOTE — Consult Note (Signed)
 Pharmacy Consult Note - Anticoagulation  Pharmacy Consult for warfarin with heparin  bridge Indication: DVT  PATIENT MEASUREMENTS: Height: 5' 5 (165.1 cm) Weight: 72.7 kg (160 lb 4.4 oz) IBW/kg (Calculated) : 57 HEPARIN  DW (KG): 71.7  VITAL SIGNS: Temp: 98.2 F (36.8 C) (10/16 0354) BP: 102/64 (10/16 0354) Pulse Rate: 65 (10/16 0354)  Recent Labs    02/11/24 0536 02/12/24 0315 02/13/24 0345  HGB 11.2* 11.2* 11.4*  HCT 35.1* 34.3* 34.8*  PLT 250 230 235  APTT 80* 96*  --   LABPROT 16.6* 21.4* 26.7*  INR 1.3* 1.8* 2.3*  HEPARINUNFRC 0.69 0.64 0.60  CREATININE 1.43*  --   --     Estimated Creatinine Clearance: 27.7 mL/min (A) (by C-G formula based on SCr of 1.43 mg/dL (H)).  PAST MEDICAL HISTORY: Past Medical History:  Diagnosis Date   Anemia    past hx   Arthritis    hands and feet   Chronic kidney disease    COPD (chronic obstructive pulmonary disease) (HCC)    Dyspnea    Dysrhythmia    Pt reports slow HR and papatations   Heart murmur    past hx   Hypercholesteremia    Hypertension    Rheumatic fever    (x2) in past   Wears dentures    full upper    ASSESSMENT: 89 y.o. female with PMH including recent DVT diagnosis is presenting with worsening DVT. She had been prescribed Eliquis  for DVT diagnosed 01/03/2024 but per discussions between RN and EDP, her facility had not been giving it regularly -- last dose unknown. Pharmacy has been consulted to initiate and manage heparin  intravenous infusion.   10/11: Per Dr. Josette, team is considering this an apixaban  failure and are transitioning patient to warfarin. INR goal 2-3. Heparin  continued as a bridge.  Heparin  Assessment: 10/10:  Heparin  gtt and bolus given @ 1524 10/10: HL @ 1536 = > 1.10 (not true baseline b/c drawn after heparin  started) 10/10: HL @ 1753 = > 1.10 10/10: aPTT @ 1923 = 185, elevated  10/11: 0707 aPTT: 84 HL > 1.1 10/12: 0344 aPTT: 68  HL > 1.10 10/12: 1426 aPTT: 75 10/13: 0419 aPTT:  73   HL > 1.10  Warfarin Assessment: Date INR Warfarin Dose  10/11 1.6 2.5 mg   10/12 1.3 3 mg   10/13 1.2 4 mg  10/14 1.3 6 mg  10/15 1.8 6 mg  10/16 2.3 4 mg     Pertinent medications: Has not been taking Eliquis  as prescribed  Goal(s) of therapy: Heparin  level 0.3 - 0.7 units/mL aPTT 66 - 102 seconds INR 2 - 3 Monitor platelets by anticoagulation protocol: Yes   Baseline anticoagulation labs: Recent Labs    02/11/24 0536 02/12/24 0315 02/13/24 0345  APTT 80* 96*  --   INR 1.3* 1.8* 2.3*  HGB 11.2* 11.2* 11.4*  PLT 250 230 235  Baseline heparin   ordered Will order baseline anti-Xa level to assess for false elevation.   Heparin  PLAN: 10/16 @ 0345:  HL = 0.60 - HL therapeutic X 3 - Continuing pt on current rate and recheck HL on 10/17 with AM labs - CBC daily while on heparin .  - Discontinue heparin  when INR therapeutic x 2.  Warfarin PLAN: INR therapeutic x 1. Balancing that patient is older age, with low albumin, and baseline INR was >1.4. Give warfarin 4 mg PO x 1 today Daily INR. CBC daily while on heparin .   Will M. Lenon, PharmD,  BCPS Clinical Pharmacist 02/13/2024 7:26 AM

## 2024-02-13 NOTE — Plan of Care (Signed)
   Problem: Education: Goal: Knowledge of General Education information will improve Description: Including pain rating scale, medication(s)/side effects and non-pharmacologic comfort measures Outcome: Progressing   Problem: Nutrition: Goal: Adequate nutrition will be maintained Outcome: Progressing   Problem: Coping: Goal: Level of anxiety will decrease Outcome: Progressing

## 2024-02-13 NOTE — Discharge Summary (Signed)
 Physician Discharge Summary   Patient: Roberta Ross MRN: [id number]  DOB: 06-21-1933   Admit:     Date of Admission: 02/07/2024 Admitted from: assisted living   Discharge: Date of discharge: 02/13/24 Disposition: Assisted living with  home health PT/OT and RN care Condition at discharge: good  CODE STATUS: DNR     Discharge Physician: Laneta Blunt, DO [facility]     PCP: Lenon Layman ORN, MD  Recommendations for Outpatient Follow-up:  Follow up with PCP Lenon Layman ORN, MD in 1-2 weeks Please obtain labs/tests: INR in 1 day 02/14/2024 for coumadin monitoring and adjustment as needed   Discharge Instructions     Diet - low sodium heart healthy   Complete by: As directed    Discharge instructions   Complete by: As directed    INR TO CHECK TOMORROW 02/14/24 - GOAL INR 2-3 REPORT INR TO ATTENDING PROVIDER / PHARMACIST FOR DOSE ADJUSTING FURTHER INR CHECKS PER ATTENDING PROVIDER / PHARMACIST   Increase activity slowly   Complete by: As directed    No wound care   Complete by: As directed    No wound care   Complete by: As directed          Discharge Diagnoses: Principal Problem:   Acute DVT (deep venous thrombosis) (HCC) Active Problems:   Acute gout   Acute metabolic encephalopathy   Acute kidney injury superimposed on CKD   Atrial fibrillation, chronic (HCC)   Essential hypertension   HLD (hyperlipidemia)   GAD (generalized anxiety disorder)   Rheumatoid arthritis (HCC)   Pressure injury of skin   Decubitus ulcer of right buttock, stage 2 [facility])        Hospital course / significant events:   HPI: 89 y.o. female with medical history significant for A-fib on Eliquis , HTN, CHF, COPD, DVT on Eliquis  who presented to ED for evaluation of bilateral leg pain.  Patient was diagnosed with DVT recently 01/03/24 on right lower extremity --> increased eliwuis, dc, f/u w/ vascular 10/01 no concerns plan follow-up again in 3  months with venous ultrasound. Patient again to ED 10/10 not able to walk due to pain RLE. Of note, had x-ray done at her facility which demonstrated a bone lesion in femur (benign per ortho no f/u needed).    10/10: to ED, lower extremity ultrasound showed an extension of the previous nonocclusive DVT in her right lower extremity to right common femoral vein. Vascular surgery was consulted in the ED - recs thrombectomy, was declined by pt/fam at that time. EDP discussed with hematology Dr. Babara who advised start heparin  drip and transition to Coumadin.  Admitted to hospitalist. Concern also for acute gout R ankle  10/11: Patient stated initially she had pain in her right ankle and when she was given prednisone that have improved.  Now considering for thrombectomy, hospitalist spoke with vascular surgeon on-call covering for the weekend and recommended medical management with anticoagulation. 10/12: Still having pain in right ankle.  10/13: patient having some confusion.  Decrease prednisone down to 20 mg today.  Patient ankle pain is better today than it was yesterday. 10/14: wanted to go back up on her prednisone to 40 mg daily for right ankle pain.  Will readjust the dose of colchicine.  INR still low at 1.3. Qualifying for rehab but pt would like to go back to ALF 10/15: ALF will not take her back without rehab, will work on this. INR still subtherapeutic, remain on heparin   10/16: INR at goal. Confirmed can check this level tomorrow at facility and follow appropriately for dose management       Consultants:  Vascular surgery  Informal d/w HemOnc and Ortho as above   Procedures/Surgeries: none      ASSESSMENT & PLAN:   Acute DVT (deep venous thrombosis)  Subtherapeutic INR  This is an Eliquis  failure Coumadin to continue INR goal 2-3 Coumadin 4 mg daily for now pending further INR  INR check tomorrow 02/14/24 and as needed following that    Acute gout Right ankle.   prednisone  to 40 mg daily burst here and back to her chronic 5mg  daily on discharge  Status post dose of colchicine on 10/12 and 10/14.   Low-dose allopurinol started. Follow w/ PCP   Acute metabolic encephalopathy Mental status improved  Monitor    Acute kidney injury superimposed on CKD AKI on CKD stage IIIb Monitor BMP within 1 week    Atrial fibrillation, chronic  Patient had sinus pauses up to 2.4 seconds. Coumadin  diltiazem . chronic atrial fibrillation we do not do anything with pauses that are less than 5 seconds.     Essential hypertension Cardizem    HLD (hyperlipidemia) Zocor    Pressure injury of skin Wound 02/07/24 2030 Pressure Injury Buttocks Right Stage 2 -  Partial thickness loss of dermis presenting as a shallow open injury with a red, pink wound bed without slough. (Active)  Present on admission   Rheumatoid arthritis  On chronic prednisone.     GAD (generalized anxiety disorder) On Zoloft    overweight based on BMI: Body mass index is 26.67 kg/m.SABRA Significantly low or high BMI is associated with higher medical risk.  Underweight - under 18  overweight - 25 to 29 obese - 30 or more Class 1 obesity: BMI of 30.0 to 34 Class 2 obesity: BMI of 35.0 to 39 Class 3 obesity: BMI of 40.0 to 49 Super Morbid Obesity: BMI 50-59 Super-super Morbid Obesity: BMI 60+ Healthy nutrition and physical activity advised as adjunct to other disease management and risk reduction treatments          Discharge Instructions  Allergies as of 02/13/2024       Reactions   Metoprolol Shortness Of Breath   Penicillins Other (See Comments)   Internal swelling including throat TOLERATES CEFTRIAXONE    Methotrexate And Trimetrexate Nausea Only   Doxycycline Hyclate Rash        Medication List     STOP taking these medications    apixaban  5 MG Tabs tablet Commonly known as: ELIQUIS    Apixaban  Starter Pack (10mg  and 5mg ) Commonly known as: ELIQUIS  STARTER PACK    cefUROXime  250 MG tablet Commonly known as: CEFTIN    meloxicam 7.5 MG tablet Commonly known as: MOBIC   potassium chloride  10 MEQ tablet Commonly known as: KLOR-CON    traMADol  50 MG tablet Commonly known as: ULTRAM        TAKE these medications    acetaminophen  650 MG CR tablet Commonly known as: TYLENOL  Take 650 mg by mouth 2 (two) times daily.   albuterol  108 (90 Base) MCG/ACT inhaler Commonly known as: VENTOLIN  HFA Inhale 2 puffs into the lungs every 4 (four) hours as needed for wheezing or shortness of breath.   allopurinol 100 MG tablet Commonly known as: ZYLOPRIM Take 0.5 tablets (50 mg total) by mouth daily. Start taking on: February 14, 2024   ammonium lactate 12 % lotion Commonly known as: LAC-HYDRIN Apply 1 Application topically daily.  diclofenac Sodium 1 % Gel Commonly known as: VOLTAREN Apply 4 g topically 4 (four) times daily. Both knees   diltiazem  240 MG 24 hr tablet Commonly known as: CARDIZEM  LA Take 240 mg by mouth daily.   folic acid 400 MCG tablet Commonly known as: FOLVITE Take 400 mcg by mouth in the morning.   melatonin 5 MG Tabs Take 1 tablet (5 mg total) by mouth at bedtime.   oxyCODONE  5 MG immediate release tablet Commonly known as: Oxy IR/ROXICODONE  Take 0.5-1 tablets (2.5-5 mg total) by mouth every 6 (six) hours as needed for moderate pain (pain score 4-6) or severe pain (pain score 7-10).   polyethylene glycol 17 g packet Commonly known as: MIRALAX  / GLYCOLAX  Take 17 g by mouth daily.   predniSONE 5 MG tablet Commonly known as: DELTASONE Take 5 mg by mouth daily.   sennosides-docusate sodium  8.6-50 MG tablet Commonly known as: SENOKOT-S Take 2 tablets by mouth 2 (two) times daily.   sertraline 50 MG tablet Commonly known as: ZOLOFT Take 50 mg by mouth daily.   simvastatin  10 MG tablet Commonly known as: Zocor  Take 1 tablet (10 mg total) by mouth every evening.   Spiriva HandiHaler 18 MCG Caps Generic drug:  Tiotropium Bromide Place 18 mcg into inhaler and inhale daily as needed (shortness of breath).   torsemide  20 MG tablet Commonly known as: DEMADEX  Take 1 tablet (20 mg total) by mouth every other day.   Vitamin D (Ergocalciferol) 1.25 MG (50000 UNIT) Caps capsule Commonly known as: DRISDOL Take 50,000 Units by mouth once a week.   warfarin 2 MG tablet Commonly known as: COUMADIN Take 2 tablets (4 mg total) by mouth daily. INR check 02/14/24 and further dose adjustments based on that level. Goal 2-3   Zinc Oxide 20 % Pste Apply 1 application  topically 3 (three) times daily. Bottom/sacrum               Durable Medical Equipment  (From admission, onward)           Start     Ordered   02/13/24 1417  DME 3-in-1  Once        02/13/24 1416              Allergies  Allergen Reactions   Metoprolol Shortness Of Breath   Penicillins Other (See Comments)    Internal swelling including throat TOLERATES CEFTRIAXONE    Methotrexate And Trimetrexate Nausea Only   Doxycycline Hyclate Rash     Subjective: pt feeling well other than hungry (didn't get her lunch). No other complains. Denies CP/SOB, pain is controlled,    Discharge Exam: BP 108/80 (BP Location: Right Arm)   Pulse 64   Temp 97.7 F (36.5 C)   Resp 18   Ht 5' 5 (1.651 m)   Wt 72.7 kg   SpO2 94%   BMI 26.67 kg/m  General: Pt is alert, awake, not in acute distress Cardiovascular: normal rate, irreg/irreg Respiratory: CTA bilaterally, no wheezing, no rhonchi Abdominal: Soft, NT, ND, bowel sounds + Extremities: no edema, no cyanosis     The results of significant diagnostics from this hospitalization (including imaging, microbiology, ancillary and laboratory) are listed below for reference.     Microbiology: Recent Results (from the past 240 hours)  MRSA Next Gen by PCR, Nasal     Status: None   Collection Time: 02/13/24  9:42 AM   Specimen: Nasal Mucosa; Nasal Swab  Result Value Ref Range  Status  MRSA by PCR Next Gen NOT DETECTED NOT DETECTED Final    Comment: (NOTE) The GeneXpert MRSA Assay (FDA approved for NASAL specimens only), is one component of a comprehensive MRSA colonization surveillance program. It is not intended to diagnose MRSA infection nor to guide or monitor treatment for MRSA infections. Test performance is not FDA approved in patients less than 54 years old. Performed at [facility], [address] [place], [place] [address]      Labs: BNP (last 3 results) No results for input(s): BNP in the last 8760 hours. Basic Metabolic Panel: Recent Labs  Lab 02/07/24 0948 02/08/24 0251 02/09/24 0344 02/11/24 0536  NA 141 139  --   --   K 4.3 4.2  --   --   CL 104 107  --   --   CO2 26 23  --   --   GLUCOSE 127* 143*  --   --   BUN 25* 28*  --   --   CREATININE 1.71* 1.35* 1.40* 1.43*  CALCIUM 10.0 9.4  --   --    Liver Function Tests: Recent Labs  Lab 02/07/24 0948 02/08/24 0251  AST 12* 10*  ALT 8 9  ALKPHOS 88 78  BILITOT 0.8 0.6  PROT 7.3 6.2*  ALBUMIN 3.8 2.9*   No results for input(s): LIPASE, AMYLASE in the last 168 hours. No results for input(s): AMMONIA in the last 168 hours. CBC: Recent Labs  Lab 02/07/24 0948 02/08/24 0251 02/09/24 0344 02/10/24 0419 02/11/24 0536 02/12/24 0315 02/13/24 0345  WBC 7.7   < > 6.4 8.9 9.2 9.9 9.6  NEUTROABS 5.5  --   --   --   --   --   --   HGB 12.0   < > 10.9* 10.7* 11.2* 11.2* 11.4*  HCT 37.2   < > 34.2* 33.5* 35.1* 34.3* 34.8*  MCV 100.0   < > 99.4 98.8 98.6 99.1 97.8  PLT 242   < > 237 240 250 230 235   < > = values in this interval not displayed.   Cardiac Enzymes: No results for input(s): CKTOTAL, CKMB, CKMBINDEX, TROPONINI in the last 168 hours. BNP: Invalid input(s): POCBNP CBG: No results for input(s): GLUCAP in the last 168 hours. D-Dimer No results for input(s): DDIMER in the last 72 hours. Hgb A1c No results for input(s): HGBA1C in  the last 72 hours. Lipid Profile No results for input(s): CHOL, HDL, LDLCALC, TRIG, CHOLHDL, LDLDIRECT in the last 72 hours. Thyroid  function studies No results for input(s): TSH, T4TOTAL, T3FREE, THYROIDAB in the last 72 hours.  Invalid input(s): FREET3 Anemia work up No results for input(s): VITAMINB12, FOLATE, FERRITIN, TIBC, IRON, RETICCTPCT in the last 72 hours. Urinalysis    Component Value Date/Time   COLORURINE STRAW (A) 10/12/2023 1627   APPEARANCEUR CLEAR (A) 10/12/2023 1627   LABSPEC 1.008 10/12/2023 1627   PHURINE 6.0 10/12/2023 1627   GLUCOSEU NEGATIVE 10/12/2023 1627   HGBUR NEGATIVE 10/12/2023 1627   BILIRUBINUR NEGATIVE 10/12/2023 1627   KETONESUR NEGATIVE 10/12/2023 1627   PROTEINUR NEGATIVE 10/12/2023 1627   NITRITE NEGATIVE 10/12/2023 1627   LEUKOCYTESUR TRACE (A) 10/12/2023 1627   Sepsis Labs Recent Labs  Lab 02/10/24 0419 02/11/24 0536 02/12/24 0315 02/13/24 0345  WBC 8.9 9.2 9.9 9.6   Microbiology Recent Results (from the past 240 hours)  MRSA Next Gen by PCR, Nasal     Status: None   Collection Time: 02/13/24  9:42 AM  Specimen: Nasal Mucosa; Nasal Swab  Result Value Ref Range Status   MRSA by PCR Next Gen NOT DETECTED NOT DETECTED Final    Comment: (NOTE) The GeneXpert MRSA Assay (FDA approved for NASAL specimens only), is one component of a comprehensive MRSA colonization surveillance program. It is not intended to diagnose MRSA infection nor to guide or monitor treatment for MRSA infections. Test performance is not FDA approved in patients less than 59 years old. Performed at [facility], [address] [place], [place] [address]    Imaging US  Venous Img Lower Unilateral Right Result Date: 02/07/2024 CLINICAL DATA:  Leg swelling previously documented DVT identified on 01/03/2024 EXAM: RIGHT LOWER EXTREMITY VENOUS DOPPLER ULTRASOUND TECHNIQUE: Gray-scale sonography with compression, as well as  color and duplex ultrasound, were performed to evaluate the deep venous system(s) from the level of the common femoral vein through the popliteal and proximal calf veins. COMPARISON:  Bilateral lower extremity venous Doppler ultrasound 01/03/2024. FINDINGS: Right lower extremity Common femoral vein: Partially compressible acute DVT nonocclusive. Including the saphenofemoral junction. Profunda vein: Normal compressibility and respiratory phasicity and response to augmentation. Femoral vein: Nonocclusive thrombus again seen with partial compressibility. Popliteal vein: The proximal popliteal vein is patent, however distal popliteal vein is occluded thrombus. Calf veins: Noncompressible in a solitary posterior tibial vein and both peroneal veins. IMPRESSION: Worsening acute right lower extremity DVT which now involves the common femoral vein (nonocclusive) and the popliteal vein (complete occlusion secondary to acute distal thrombosis). Sent to [facility] for physician communication Electronically Signed   By: Cordella Banner   On: 02/07/2024 13:45   CT FEMUR RIGHT WO CONTRAST Result Date: 02/07/2024 CLINICAL DATA:  Bone lesion on radiography EXAM: CT OF THE LOWER RIGHT EXTREMITY WITHOUT CONTRAST TECHNIQUE: Multidetector CT imaging of the right lower extremity was performed according to the standard protocol. RADIATION DOSE REDUCTION: This exam was performed according to the departmental dose-optimization program which includes automated exposure control, adjustment of the mA and/or kV according to patient size and/or use of iterative reconstruction technique. COMPARISON:  None Available. FINDINGS: Bones/Joint/Cartilage 5.1 by 1.5 by 1.3 cm sclerotic lesion in the mid to distal femoral diaphysis with general chondroid matrix favoring enchondroma over bone infarct. Sharp transition zone. No significant degree of endosteal scalloping. No cortical breakthrough or soft tissue component. Ligaments Suboptimally assessed by CT.  Muscles and Tendons Unremarkable Soft tissues Atheromatous vascular calcifications. Sigmoid colon diverticulosis. Atrophic right gluteus minimus muscle. Spurring of the pubic bones. IMPRESSION: 1. 5.1 by 1.5 by 1.3 cm sclerotic lesion in the mid to distal femoral diaphysis with general chondroid matrix favoring benign enchondroma. No worrisome/adverse features to suggest chondrosarcoma. 2. Atheromatous vascular calcifications. 3. Sigmoid colon diverticulosis. 4. Atrophic right gluteus minimus muscle. Electronically Signed   By: Ryan Salvage M.D.   On: 02/07/2024 10:52      Time coordinating discharge: over 30 minutes  SIGNED:  Jefferey Lippmann DO [facility]

## 2024-03-07 ENCOUNTER — Emergency Department
Admission: EM | Admit: 2024-03-07 | Discharge: 2024-03-08 | Disposition: A | Discharge status: Skilled Nursing Facility | Attending: Emergency Medicine | Admitting: Emergency Medicine

## 2024-03-07 ENCOUNTER — Emergency Department

## 2024-03-07 ENCOUNTER — Other Ambulatory Visit: Payer: Self-pay

## 2024-03-07 DIAGNOSIS — W19XXXA Unspecified fall, initial encounter: Secondary | ICD-10-CM | POA: Diagnosis not present

## 2024-03-07 DIAGNOSIS — M069 Rheumatoid arthritis, unspecified: Secondary | ICD-10-CM | POA: Diagnosis not present

## 2024-03-07 DIAGNOSIS — S80212A Abrasion, left knee, initial encounter: Secondary | ICD-10-CM | POA: Insufficient documentation

## 2024-03-07 DIAGNOSIS — J449 Chronic obstructive pulmonary disease, unspecified: Secondary | ICD-10-CM | POA: Diagnosis not present

## 2024-03-07 DIAGNOSIS — S50811A Abrasion of right forearm, initial encounter: Secondary | ICD-10-CM | POA: Diagnosis not present

## 2024-03-07 DIAGNOSIS — S80812A Abrasion, left lower leg, initial encounter: Secondary | ICD-10-CM | POA: Diagnosis not present

## 2024-03-07 DIAGNOSIS — I129 Hypertensive chronic kidney disease with stage 1 through stage 4 chronic kidney disease, or unspecified chronic kidney disease: Secondary | ICD-10-CM | POA: Diagnosis not present

## 2024-03-07 DIAGNOSIS — M25511 Pain in right shoulder: Secondary | ICD-10-CM | POA: Diagnosis present

## 2024-03-07 DIAGNOSIS — N189 Chronic kidney disease, unspecified: Secondary | ICD-10-CM | POA: Insufficient documentation

## 2024-03-07 DIAGNOSIS — I482 Chronic atrial fibrillation, unspecified: Secondary | ICD-10-CM | POA: Diagnosis not present

## 2024-03-07 DIAGNOSIS — S43014A Anterior dislocation of right humerus, initial encounter: Secondary | ICD-10-CM | POA: Diagnosis not present

## 2024-03-07 MED ORDER — MORPHINE SULFATE (PF) 4 MG/ML IV SOLN
4.0000 mg | Freq: Once | INTRAVENOUS | Status: AC
  Administered 2024-03-07: 4 mg via INTRAVENOUS
  Filled 2024-03-07: qty 1

## 2024-03-07 NOTE — Discharge Instructions (Addendum)
 Wear the shoulder immobilizer at all times to keep the shoulder stable and prevent recurrent dislocation until you follow-up with orthopedics.  It is okay to take off the immobilizer to shower, but during this time you should keep the right arm hanging at your side and do not raise your arm up to chest height or above.  Take Tylenol  650 mg every 6 hours as needed for pain.

## 2024-03-07 NOTE — ED Triage Notes (Signed)
 Pt presents from [facility] for a mechanical fall. Seen recently for DVT and states the medication is making me fall. Endorsing right shoulder pain with concern for deformity, right forearm laceration/skin tear, left leg laceration/skin tear and right knee skin tear. Bleeding controlled. Got 4 mg Zofran  and 50 mcg Fentanyl  with [facility]

## 2024-03-07 NOTE — ED Provider Notes (Signed)
 [facility] Provider Note    Event Date/Time   First MD Initiated Contact with Patient 03/07/24 2112     (approximate)   History   Chief Complaint: Fall   HPI  Paislei Dorval is a 89 y.o. female with a past history of COPD, hypertension, CKD, atrial fibrillation, rheumatoid arthritis who comes ED due to a mechanical fall.  Reports that she lost her balance, fell forward scraping her left lower leg and her right arm.  No head injury or loss of consciousness, no neck pain chest pain shortness of breath.  Feels like her right arm is dislocated        Past Medical History:  Diagnosis Date   Anemia    past hx   Arthritis    hands and feet   Chronic kidney disease    COPD (chronic obstructive pulmonary disease) (HCC)    Dyspnea    Dysrhythmia    Pt reports slow HR and papatations   Heart murmur    past hx   Hypercholesteremia    Hypertension    Rheumatic fever    (x2) in past   Wears dentures    full upper    Current Outpatient Rx   Order #: [id number] Class: Historical Med   Order #: [id number] Class: Print   Order #: [id number] Class: Normal   Order #: [id number] Class: Historical Med   Order #: [id number] Class: Historical Med   Order #: [id number] Class: Historical Med   Order #: [id number] Class: Historical Med   Order #: [id number] Class: Normal   Order #: [id number] Class: Normal   Order #: [id number] Class: Historical Med   Order #: [id number] Class: Historical Med   Order #: [id number] Class: Historical Med   Order #: [id number] Class: Historical Med   Order #: [id number] Class: Normal   Order #: [id number] Class: Historical Med   Order #: [id number] Class: Normal   Order #: [id number] Class: Historical Med   Order #: [id number] Class: Normal   Order #: [id number] Class: Historical Med    Past Surgical History:  Procedure Laterality Date   ABDOMINAL HYSTERECTOMY     APPENDECTOMY     as a child   CATARACT EXTRACTION W/PHACO Left 01/10/2015    Procedure: CATARACT EXTRACTION PHACO AND INTRAOCULAR LENS PLACEMENT (IOC);  Surgeon: Donzell Arlyce Budd, MD;  Location: [facility];  Service: Ophthalmology;  Laterality: Left;   COMPLEX WOUND CLOSURE Left 11/15/2021   Procedure: COMPLEX WOUND CLOSURE;  Surgeon: Edda Mt, MD;  Location: [facility];  Service: ENT;  Laterality: Left;   EXCISION MASS HEAD Left 11/15/2021   Procedure: EXCISION MALIGNANT MASS EAR;  Surgeon: Edda Mt, MD;  Location: [facility];  Service: ENT;  Laterality: Left;   INTRAMEDULLARY (IM) NAIL INTERTROCHANTERIC Left 03/28/2020   Procedure: INTRAMEDULLARY (IM) NAIL INTERTROCHANTRIC-Hip Fracture;  Surgeon: Tobie Priest, MD;  Location: [facility];  Service: Orthopedics;  Laterality: Left;   TONSILLECTOMY      Physical Exam   Triage Vital Signs: ED Triage Vitals  Encounter Vitals Group     BP 03/07/24 2117 (!) 89/67     Girls Systolic BP Percentile --      Girls Diastolic BP Percentile --      Boys Systolic BP Percentile --      Boys Diastolic BP Percentile --      Pulse Rate 03/07/24 2117 74     Resp 03/07/24 2117 20     Temp 03/07/24 2117 98.6 F (37 C)     Temp src --      SpO2  03/07/24 2117 95 %     Weight 03/07/24 2115 147 lb 8 oz (66.9 kg)     Height 03/07/24 2115 5' 5 (1.651 m)     Head Circumference --      Peak Flow --      Pain Score 03/07/24 2114 8     Pain Loc --      Pain Education --      Exclude from Growth Chart --     Most recent vital signs: Vitals:   03/07/24 2117 03/07/24 2200  BP: (!) 89/67 101/71  Pulse: 74 (!) 132  Resp: 20   Temp: 98.6 F (37 C)   SpO2: 95% 99%    General: Awake, no distress.  CV:  Good peripheral perfusion.  Regular rate, normal distal pulses Resp:  Normal effort.  Clear lungs Abd:  No distention.  Soft nontender Other:  Right proximal humerus with deformity.  Tenderness at the right shoulder.  Clavicle stable.  Multiple skin tears on the right forearm, left knee, left lower leg, without  laceration.   ED Results / Procedures / Treatments   Labs (all labs ordered are listed, but only abnormal results are displayed) Labs Reviewed - No data to display   EKG    RADIOLOGY X-ray right shoulder interpreted by me, shows dislocation of the humerus without fracture.  Radiology report reviewed  Hip x-ray negative   PROCEDURES:  .Reduction of dislocation  Date/Time: 03/07/2024 11:35 PM  Performed by: Viviann Pastor, MD Authorized by: Viviann Pastor, MD  Consent: Verbal consent obtained Consent given by: patient Imaging studies: imaging studies available Patient identity confirmed: verbally with patient Time out: Immediately prior to procedure a time out was called to verify the correct patient, procedure, equipment, support staff and site/side marked as required. Local anesthesia used: no  Anesthesia: Local anesthesia used: no  Sedation: Patient sedated: no  Patient tolerance: patient tolerated the procedure well with no immediate complications      MEDICATIONS ORDERED IN ED: Medications  morphine  (PF) 4 MG/ML injection 4 mg (4 mg Intravenous Given 03/07/24 2201)     IMPRESSION / MDM / ASSESSMENT AND PLAN / ED COURSE  I reviewed the triage vital signs and the nursing notes.  DDx: Right humerus fracture, right shoulder dislocation, left hip fracture, skin tears  Patient's presentation is most consistent with acute presentation with potential threat to life or bodily function.  Patient presents with left hip pain and right shoulder pain after mechanical fall.  Exam concerning for a right shoulder dislocation versus humeral fracture.  X-rays show anterior dislocation without fracture.   Clinical Course as of 03/07/24 2334  Sat Mar 07, 2024  2230 Shoulder dislocation reduced at bedside with external rotation and elbow adduction. [PS]    Clinical Course User Index [PS] Viviann Pastor, MD     FINAL CLINICAL IMPRESSION(S) / ED DIAGNOSES    Final diagnoses:  Anterior dislocation of right shoulder, initial encounter  Chronic atrial fibrillation (HCC)  Rheumatoid arthritis involving both hands, unspecified whether rheumatoid factor present (HCC)     Rx / DC Orders   ED Discharge Orders     None        Note:  This document was prepared using Dragon voice recognition software and may include unintentional dictation errors.   Viviann Pastor, MD 03/07/24 [phone]

## 2024-03-08 NOTE — ED Notes (Signed)
 Spoke with Ernest at [facility] (nurses station extension) who stated someone would be available to help the patient back to her room if the family member brings her home

## 2024-03-12 ENCOUNTER — Encounter
Admission: EM | Disposition: A | Discharge status: Skilled Nursing Facility | Payer: Self-pay | Source: Home / Self Care | Attending: Emergency Medicine

## 2024-03-12 ENCOUNTER — Observation Stay: Admitting: Certified Registered Nurse Anesthetist

## 2024-03-12 ENCOUNTER — Encounter: Payer: Self-pay | Admitting: *Deleted

## 2024-03-12 ENCOUNTER — Emergency Department

## 2024-03-12 ENCOUNTER — Observation Stay

## 2024-03-12 ENCOUNTER — Observation Stay
Admission: EM | Admit: 2024-03-12 | Discharge: 2024-03-16 | Disposition: A | Discharge status: Skilled Nursing Facility | Attending: Surgery | Admitting: Surgery

## 2024-03-12 ENCOUNTER — Other Ambulatory Visit: Payer: Self-pay

## 2024-03-12 DIAGNOSIS — N1832 Chronic kidney disease, stage 3b: Secondary | ICD-10-CM | POA: Diagnosis not present

## 2024-03-12 DIAGNOSIS — I130 Hypertensive heart and chronic kidney disease with heart failure and stage 1 through stage 4 chronic kidney disease, or unspecified chronic kidney disease: Secondary | ICD-10-CM | POA: Insufficient documentation

## 2024-03-12 DIAGNOSIS — M24411 Recurrent dislocation, right shoulder: Secondary | ICD-10-CM | POA: Diagnosis present

## 2024-03-12 DIAGNOSIS — J449 Chronic obstructive pulmonary disease, unspecified: Secondary | ICD-10-CM

## 2024-03-12 DIAGNOSIS — I509 Heart failure, unspecified: Secondary | ICD-10-CM | POA: Diagnosis not present

## 2024-03-12 DIAGNOSIS — S43014A Anterior dislocation of right humerus, initial encounter: Principal | ICD-10-CM | POA: Diagnosis present

## 2024-03-12 DIAGNOSIS — I480 Paroxysmal atrial fibrillation: Secondary | ICD-10-CM

## 2024-03-12 DIAGNOSIS — F32A Depression, unspecified: Secondary | ICD-10-CM | POA: Diagnosis not present

## 2024-03-12 DIAGNOSIS — J4489 Other specified chronic obstructive pulmonary disease: Secondary | ICD-10-CM | POA: Diagnosis not present

## 2024-03-12 DIAGNOSIS — Z87891 Personal history of nicotine dependence: Secondary | ICD-10-CM | POA: Insufficient documentation

## 2024-03-12 DIAGNOSIS — E785 Hyperlipidemia, unspecified: Secondary | ICD-10-CM | POA: Diagnosis not present

## 2024-03-12 DIAGNOSIS — M109 Gout, unspecified: Secondary | ICD-10-CM | POA: Diagnosis not present

## 2024-03-12 DIAGNOSIS — Z79899 Other long term (current) drug therapy: Secondary | ICD-10-CM | POA: Insufficient documentation

## 2024-03-12 HISTORY — PX: SHOULDER CLOSED REDUCTION: SHX1051

## 2024-03-12 LAB — CBC
HCT: 35.9 % — ABNORMAL LOW (ref 36.0–46.0)
Hemoglobin: 11.1 g/dL — ABNORMAL LOW (ref 12.0–15.0)
MCH: 31.4 pg (ref 26.0–34.0)
MCHC: 30.9 g/dL (ref 30.0–36.0)
MCV: 101.7 fL — ABNORMAL HIGH (ref 80.0–100.0)
Platelets: 189 K/uL (ref 150–400)
RBC: 3.53 MIL/uL — ABNORMAL LOW (ref 3.87–5.11)
RDW: 14 % (ref 11.5–15.5)
WBC: 7.6 K/uL (ref 4.0–10.5)
nRBC: 0 % (ref 0.0–0.2)

## 2024-03-12 LAB — COMPREHENSIVE METABOLIC PANEL WITH GFR
ALT: 6 U/L (ref 0–44)
AST: 12 U/L — ABNORMAL LOW (ref 15–41)
Albumin: 3.4 g/dL — ABNORMAL LOW (ref 3.5–5.0)
Alkaline Phosphatase: 81 U/L (ref 38–126)
Anion gap: 9 (ref 5–15)
BUN: 18 mg/dL (ref 8–23)
CO2: 26 mmol/L (ref 22–32)
Calcium: 9.2 mg/dL (ref 8.9–10.3)
Chloride: 105 mmol/L (ref 98–111)
Creatinine, Ser: 1.29 mg/dL — ABNORMAL HIGH (ref 0.44–1.00)
GFR, Estimated: 40 mL/min — ABNORMAL LOW (ref >60)
Glucose, Bld: 118 mg/dL — ABNORMAL HIGH (ref 70–99)
Potassium: 4 mmol/L (ref 3.5–5.1)
Sodium: 140 mmol/L (ref 135–145)
Total Bilirubin: 0.3 mg/dL (ref 0.0–1.2)
Total Protein: 5.6 g/dL — ABNORMAL LOW (ref 6.5–8.1)

## 2024-03-12 LAB — PROTIME-INR
INR: 2.6 — ABNORMAL HIGH (ref 0.8–1.2)
Prothrombin Time: 28.9 s — ABNORMAL HIGH (ref 11.4–15.2)

## 2024-03-12 SURGERY — CLOSED REDUCTION, SHOULDER
Anesthesia: General | Site: Shoulder | Laterality: Right

## 2024-03-12 MED ORDER — METOCLOPRAMIDE HCL 5 MG/ML IJ SOLN: 5.0000 mg | Freq: Three times a day (TID) | INTRAMUSCULAR | Status: DC | PRN

## 2024-03-12 MED ORDER — TRAZODONE HCL 50 MG PO TABS: 25.0000 mg | ORAL_TABLET | Freq: Every evening | ORAL | Status: DC | PRN

## 2024-03-12 MED ORDER — DILTIAZEM HCL ER 240 MG PO TB24
240.0000 mg | ORAL_TABLET | Freq: Every day | ORAL | Status: DC
  Administered 2024-03-13 – 2024-03-16 (×4): 240 mg via ORAL
  Filled 2024-03-12 (×8): qty 1

## 2024-03-12 MED ORDER — METOCLOPRAMIDE HCL 10 MG PO TABS: 5.0000 mg | ORAL_TABLET | Freq: Three times a day (TID) | ORAL | Status: DC | PRN

## 2024-03-12 MED ORDER — ACETAMINOPHEN 325 MG RE SUPP: 650.0000 mg | Freq: Four times a day (QID) | RECTAL | Status: DC | PRN

## 2024-03-12 MED ORDER — ALLOPURINOL 100 MG PO TABS
50.0000 mg | ORAL_TABLET | Freq: Every day | ORAL | Status: DC
  Administered 2024-03-13 – 2024-03-16 (×4): 50 mg via ORAL
  Filled 2024-03-12 (×4): qty 0.5

## 2024-03-12 MED ORDER — SIMVASTATIN 10 MG PO TABS
10.0000 mg | ORAL_TABLET | Freq: Every evening | ORAL | Status: DC
  Administered 2024-03-12 – 2024-03-15 (×4): 10 mg via ORAL
  Filled 2024-03-12 (×5): qty 1

## 2024-03-12 MED ORDER — MORPHINE SULFATE (PF) 2 MG/ML IV SOLN: 2.0000 mg | Freq: Once | INTRAVENOUS | Status: DC

## 2024-03-12 MED ORDER — ONDANSETRON HCL 4 MG/2ML IJ SOLN: 4.0000 mg | Freq: Four times a day (QID) | INTRAMUSCULAR | Status: DC | PRN

## 2024-03-12 MED ORDER — LIDOCAINE HCL (PF) 1 % IJ SOLN
10.0000 mL | Freq: Once | INTRAMUSCULAR | Status: AC
  Administered 2024-03-12: 10 mL
  Filled 2024-03-12: qty 10

## 2024-03-12 MED ORDER — FENTANYL CITRATE (PF) 50 MCG/ML IJ SOSY
50.0000 ug | PREFILLED_SYRINGE | Freq: Once | INTRAMUSCULAR | Status: AC
  Administered 2024-03-12: 50 ug via INTRAVENOUS
  Filled 2024-03-12: qty 1

## 2024-03-12 MED ORDER — SODIUM CHLORIDE 0.9 % IV SOLN: INTRAVENOUS | Status: AC

## 2024-03-12 MED ORDER — BISACODYL 10 MG RE SUPP: 10.0000 mg | Freq: Every day | RECTAL | Status: DC | PRN

## 2024-03-12 MED ORDER — SODIUM CHLORIDE 0.9 % IV SOLN: INTRAVENOUS | Status: DC

## 2024-03-12 MED ORDER — DOCUSATE SODIUM 100 MG PO CAPS
100.0000 mg | ORAL_CAPSULE | Freq: Two times a day (BID) | ORAL | Status: DC
  Administered 2024-03-12 – 2024-03-16 (×8): 100 mg via ORAL
  Filled 2024-03-12 (×8): qty 1

## 2024-03-12 MED ORDER — KETAMINE HCL 10 MG/ML IJ SOLN
INTRAMUSCULAR | Status: DC | PRN
  Administered 2024-03-12: 30 mg via INTRAVENOUS

## 2024-03-12 MED ORDER — ONDANSETRON HCL 4 MG PO TABS: 4.0000 mg | ORAL_TABLET | Freq: Four times a day (QID) | ORAL | Status: DC | PRN

## 2024-03-12 MED ORDER — LIDOCAINE HCL (PF) 2 % IJ SOLN
INTRAMUSCULAR | Status: DC | PRN
  Administered 2024-03-12: 3 mL via INTRADERMAL

## 2024-03-12 MED ORDER — PREDNISONE 5 MG PO TABS
5.0000 mg | ORAL_TABLET | Freq: Every day | ORAL | Status: DC
  Administered 2024-03-13 – 2024-03-16 (×4): 5 mg via ORAL
  Filled 2024-03-12 (×4): qty 1

## 2024-03-12 MED ORDER — MAGNESIUM HYDROXIDE 400 MG/5ML PO SUSP: 30.0000 mL | Freq: Every day | ORAL | Status: DC | PRN

## 2024-03-12 MED ORDER — ACETAMINOPHEN 325 MG PO TABS: 650.0000 mg | ORAL_TABLET | Freq: Four times a day (QID) | ORAL | Status: DC | PRN

## 2024-03-12 MED ORDER — FLEET ENEMA RE ENEM: 1.0000 | ENEMA | Freq: Once | RECTAL | Status: DC | PRN

## 2024-03-12 MED ORDER — ACETAMINOPHEN 325 MG PO TABS
325.0000 mg | ORAL_TABLET | Freq: Four times a day (QID) | ORAL | Status: DC | PRN
  Administered 2024-03-14 – 2024-03-15 (×2): 650 mg via ORAL
  Filled 2024-03-12 (×2): qty 2

## 2024-03-12 MED ORDER — ALBUTEROL SULFATE HFA 108 (90 BASE) MCG/ACT IN AERS: 2.0000 | INHALATION_SPRAY | RESPIRATORY_TRACT | Status: DC | PRN

## 2024-03-12 MED ORDER — MIDAZOLAM HCL (PF) 2 MG/2ML IJ SOLN
1.0000 mg | Freq: Once | INTRAMUSCULAR | Status: AC
  Administered 2024-03-12: 1 mg via INTRAVENOUS
  Filled 2024-03-12: qty 2

## 2024-03-12 MED ORDER — LIDOCAINE HCL (PF) 2 % IJ SOLN
INTRAMUSCULAR | Status: AC
  Filled 2024-03-12: qty 5

## 2024-03-12 MED ORDER — HYDROMORPHONE HCL 1 MG/ML IJ SOLN
0.5000 mg | Freq: Once | INTRAMUSCULAR | Status: AC
  Administered 2024-03-12: 0.5 mg via INTRAVENOUS
  Filled 2024-03-12: qty 0.5

## 2024-03-12 MED ORDER — OXYCODONE HCL 5 MG PO TABS
2.5000 mg | ORAL_TABLET | Freq: Four times a day (QID) | ORAL | Status: DC | PRN
  Administered 2024-03-14 – 2024-03-15 (×2): 5 mg via ORAL
  Filled 2024-03-12 (×2): qty 1

## 2024-03-12 MED ORDER — ALBUTEROL SULFATE (2.5 MG/3ML) 0.083% IN NEBU: 2.5000 mg | INHALATION_SOLUTION | RESPIRATORY_TRACT | Status: DC | PRN

## 2024-03-12 MED ORDER — ZINC OXIDE 20 % EX OINT
1.0000 | TOPICAL_OINTMENT | Freq: Three times a day (TID) | CUTANEOUS | Status: DC
  Administered 2024-03-12 – 2024-03-16 (×11): 1 via TOPICAL
  Filled 2024-03-12: qty 1

## 2024-03-12 MED ORDER — MAGNESIUM HYDROXIDE 400 MG/5ML PO SUSP
30.0000 mL | Freq: Every day | ORAL | Status: DC | PRN
  Administered 2024-03-15: 30 mL via ORAL
  Filled 2024-03-12: qty 30

## 2024-03-12 MED ORDER — SERTRALINE HCL 50 MG PO TABS
50.0000 mg | ORAL_TABLET | Freq: Every day | ORAL | Status: DC
  Administered 2024-03-13 – 2024-03-16 (×4): 50 mg via ORAL
  Filled 2024-03-12 (×4): qty 1

## 2024-03-12 MED ORDER — DIPHENHYDRAMINE HCL 12.5 MG/5ML PO ELIX: 12.5000 mg | ORAL_SOLUTION | ORAL | Status: DC | PRN

## 2024-03-12 MED ORDER — PROPOFOL 10 MG/ML IV BOLUS
INTRAVENOUS | Status: AC
  Filled 2024-03-12: qty 20

## 2024-03-12 MED ORDER — WARFARIN SODIUM 3 MG PO TABS
3.0000 mg | ORAL_TABLET | Freq: Every evening | ORAL | Status: DC
  Administered 2024-03-12: 3 mg via ORAL
  Filled 2024-03-12: qty 1

## 2024-03-12 MED ORDER — KETAMINE HCL 50 MG/5ML IJ SOSY
PREFILLED_SYRINGE | INTRAMUSCULAR | Status: AC
  Filled 2024-03-12: qty 5

## 2024-03-12 SURGICAL SUPPLY — 11 items
BNDG ADH 2 X3.75 FABRIC TAN LF (GAUZE/BANDAGES/DRESSINGS) ×1 IMPLANT
GAUZE SPONGE 4X4 12PLY STRL (GAUZE/BANDAGES/DRESSINGS) ×1 IMPLANT
IMMOBILIZER SHDR MD LX WHT (SOFTGOODS) IMPLANT
KIT TURNOVER KIT A (KITS) ×1 IMPLANT
NDL HYPO 21X1.5 SAFETY (NEEDLE) ×1 IMPLANT
NEEDLE HYPO 21X1.5 SAFETY (NEEDLE) ×1 IMPLANT
PAD ALCOHOL SWAB (MISCELLANEOUS) ×2 IMPLANT
SLING ARM LARGE (SOFTGOODS) ×1 IMPLANT
SLING ARM MEDIUM (SOFTGOODS) ×1 IMPLANT
SOLUTION PREP PVP 2OZ (MISCELLANEOUS) ×1 IMPLANT
SYR 10ML LL (SYRINGE) ×1 IMPLANT

## 2024-03-12 NOTE — ED Triage Notes (Signed)
 Pt to triage via wheelchair.  Pt brought in by ems from [facility].  Pt has obvious dislocation to right shoulder.  Pt was seen here recently for sam sx.  Pt denies falling or new injury today.  Limited rom of right shoulder.  Pt alert.

## 2024-03-12 NOTE — Assessment & Plan Note (Signed)
-   Will continue bronchodilator therapy.

## 2024-03-12 NOTE — Assessment & Plan Note (Signed)
-   Will continue statin therapy.  That should provide perioperative cardiovascular risk reduction.

## 2024-03-12 NOTE — ED Triage Notes (Addendum)
 First nurse note: Patient to ED via [facility] for right shoulder deformity; recently seen for right shoulder dislocation and placed in sling but patient self removed.   50mcg Fentanyl 

## 2024-03-12 NOTE — Consult Note (Signed)
 ORTHOPAEDIC CONSULTATION  REQUESTING PHYSICIAN: Mansy, Madison LABOR, MD  Chief Complaint:   Right shoulder pain  History of Present Illness: Roberta Ross is an 89 y.o. female with multiple medical problems including hypertension, hypercholesterolemia, chronic kidney disease, COPD, and a heart murmur who normally lives in [facility] unit.  The patient apparently sustained a fall 5 days ago, injuring her right shoulder.  She was brought to the emergency room where x-rays demonstrated an anterior dislocation of the right shoulder.  This was reduced by the ER provider and the patient was sent back to [facility] and a shoulder immobilizer.  Apparently, the patient was in her usual state of health earlier today when she decided to take the shoulder immobilizer off because it was irritating her.  She apparently reinjured her shoulder, but does not know exactly how.  She was brought back to the emergency room where x-rays again demonstrated an anterior dislocation of the right shoulder.  Another attempt was made by the ER provider to reduce the shoulder in the emergency room but this was unsuccessful.  Therefore, orthopedics has been consulted for further assistance with this injury.  Past Medical History:  Diagnosis Date   Anemia    past hx   Arthritis    hands and feet   Chronic kidney disease    COPD (chronic obstructive pulmonary disease) (HCC)    Dyspnea    Dysrhythmia    Pt reports slow HR and papatations   Heart murmur    past hx   Hypercholesteremia    Hypertension    Rheumatic fever    (x2) in past   Wears dentures    full upper   Past Surgical History:  Procedure Laterality Date   ABDOMINAL HYSTERECTOMY     APPENDECTOMY     as a child   CATARACT EXTRACTION W/PHACO Left 01/10/2015   Procedure: CATARACT EXTRACTION PHACO AND INTRAOCULAR LENS PLACEMENT (IOC);  Surgeon: Donzell Arlyce Budd, MD;   Location: [facility];  Service: Ophthalmology;  Laterality: Left;   COMPLEX WOUND CLOSURE Left 11/15/2021   Procedure: COMPLEX WOUND CLOSURE;  Surgeon: Edda Mt, MD;  Location: [facility];  Service: ENT;  Laterality: Left;   EXCISION MASS HEAD Left 11/15/2021   Procedure: EXCISION MALIGNANT MASS EAR;  Surgeon: Edda Mt, MD;  Location: [facility];  Service: ENT;  Laterality: Left;   INTRAMEDULLARY (IM) NAIL INTERTROCHANTERIC Left 03/28/2020   Procedure: INTRAMEDULLARY (IM) NAIL INTERTROCHANTRIC-Hip Fracture;  Surgeon: Tobie Priest, MD;  Location: [facility];  Service: Orthopedics;  Laterality: Left;   TONSILLECTOMY     Social History   Socioeconomic History   Marital status: Single    Spouse name: Not on file   Number of children: Not on file   Years of education: Not on file   Highest education level: Not on file  Occupational History   Not on file  Tobacco Use   Smoking status: Former    Current packs/day: 0.00    Average packs/day: 0.3 packs/day for 60.0 years (15.0 ttl pk-yrs)    Types: Cigarettes    Start date: 36    Quit date: 2020    Years since quitting: 5.8   Smokeless tobacco: Never  Vaping Use   Vaping status: Never Used  Substance and Sexual Activity   Alcohol use: No   Drug use: Not Currently   Sexual activity: Not on file  Other Topics Concern   Not on file  Social History Narrative   Not on  file   Social Drivers of Health   Financial Resource Strain: Not on file  Food Insecurity: No Food Insecurity (02/07/2024)   Hunger Vital Sign    Worried About Running Out of Food in the Last Year: Never true    Ran Out of Food in the Last Year: Never true  Transportation Needs: No Transportation Needs (02/07/2024)   PRAPARE - Administrator, Civil Service (Medical): No    Lack of Transportation (Non-Medical): No  Physical Activity: Not on file  Stress: Not on file  Social Connections: Socially Isolated (02/07/2024)   Social Connection and  Isolation Panel    Frequency of Communication with Friends and Family: More than three times a week    Frequency of Social Gatherings with Friends and Family: More than three times a week    Attends Religious Services: Never    Database Administrator or Organizations: No    Attends Banker Meetings: Never    Marital Status: Widowed   Family History  Problem Relation Age of Onset   Cervical cancer Mother    Heart attack Father    Prostate cancer Father    Asthma Brother    Allergies  Allergen Reactions   Metoprolol Shortness Of Breath   Penicillins Other (See Comments)    Internal swelling including throat TOLERATES CEFTRIAXONE    Methotrexate And Trimetrexate Nausea Only   Doxycycline Hyclate Rash   Prior to Admission medications   Medication Sig Start Date End Date Taking? Authorizing Provider  acetaminophen  (TYLENOL ) 650 MG CR tablet Take 650 mg by mouth 2 (two) times daily.   Yes [provider]  albuterol  (VENTOLIN  HFA) 108 (90 Base) MCG/ACT inhaler Inhale 2 puffs into the lungs every 4 (four) hours as needed for wheezing or shortness of breath. 05/03/22  Yes Sung, Jade J, MD  allopurinol (ZYLOPRIM) 100 MG tablet Take 0.5 tablets (50 mg total) by mouth daily. 02/14/24  Yes Alexander, Natalie, DO  ammonium lactate (LAC-HYDRIN) 12 % lotion Apply 1 Application topically daily.   Yes [provider]  diclofenac Sodium (VOLTAREN) 1 % GEL Apply 4 g topically 4 (four) times daily. Both knees   Yes [provider]  diltiazem  (CARDIZEM  LA) 240 MG 24 hr tablet Take 240 mg by mouth daily. 02/04/23  Yes [provider]  folic acid (FOLVITE) 400 MCG tablet Take 400 mcg by mouth in the morning.   Yes [provider]  melatonin 5 MG TABS Take 1 tablet (5 mg total) by mouth at bedtime. 02/13/24  Yes Alexander, Natalie, DO  oxyCODONE  (OXY IR/ROXICODONE ) 5 MG immediate release tablet Take 0.5-1 tablets (2.5-5 mg total) by mouth every 6 (six)  hours as needed for moderate pain (pain score 4-6) or severe pain (pain score 7-10). Patient taking differently: Take 5 mg by mouth every 6 (six) hours as needed for moderate pain (pain score 4-6) or severe pain (pain score 7-10). 02/13/24  Yes Alexander, Natalie, DO  polyethylene glycol (MIRALAX  / GLYCOLAX ) 17 g packet Take 17 g by mouth daily.   Yes [provider]  predniSONE (DELTASONE) 5 MG tablet Take 5 mg by mouth daily.   Yes [provider]  sennosides-docusate sodium  (SENOKOT-S) 8.6-50 MG tablet Take 2 tablets by mouth 2 (two) times daily.   Yes [provider]  sertraline (ZOLOFT) 50 MG tablet Take 50 mg by mouth daily. 02/03/24  Yes [provider]  simvastatin  (ZOCOR ) 10 MG tablet Take 1 tablet (10  mg total) by mouth every evening. 01/25/22 03/12/24 Yes Krishnan, Sendil K, MD  torsemide  (DEMADEX ) 20 MG tablet Take 1 tablet (20 mg total) by mouth every other day. 03/03/23  Yes Patel, Sona, MD  Vitamin D, Ergocalciferol, (DRISDOL) 1.25 MG (50000 UNIT) CAPS capsule Take 50,000 Units by mouth once a week. 08/19/23  Yes [provider]  warfarin (COUMADIN) 2 MG tablet Take 2 tablets (4 mg total) by mouth daily. INR check 02/14/24 and further dose adjustments based on that level. Goal 2-3 Patient taking differently: Take 2-4 mg by mouth daily. Give 1 tablet in the evening every Tue and Thu at 6pm  Give 2 tablets in the evening every Mon, Wed, Fri, Sat,and Sun at 6pm 02/13/24  Yes Marsa Edelman, DO  Zinc Oxide 20 % PSTE Apply 1 application  topically 3 (three) times daily. Bottom/sacrum   Yes [provider]  SPIRIVA HANDIHALER 18 MCG inhalation capsule Place 18 mcg into inhaler and inhale daily as needed (shortness of breath).    [provider]   DG Shoulder Right Result Date: 03/12/2024 EXAM: 1 VIEW XRAY OF THE RIGHT SHOULDER 03/12/2024 04:17:00 PM COMPARISON: Comparison 5 days ago. CLINICAL HISTORY: possible injury FINDINGS:  BONES AND JOINTS: Anterior dislocation of the right glenohumeral joint is noted. No definite fracture is noted. The [facility] joint is unremarkable in appearance. SOFT TISSUES: No abnormal calcifications. Visualized lung is unremarkable. IMPRESSION: 1. Anterior dislocation of the right glenohumeral joint. 2. No definite fracture identified. Electronically signed by: Lynwood Seip MD 03/12/2024 04:21 PM EST RP Workstation: [id number]    Positive ROS: All other systems have been reviewed and were otherwise negative with the exception of those mentioned in the HPI and as above.  Physical Exam: General:  Alert, no acute distress Psychiatric:  Patient is competent for consent with normal mood and affect   Cardiovascular:  No pedal edema Respiratory:  No wheezing, non-labored breathing GI:  Abdomen is soft and non-tender Skin:  No lesions in the area of chief complaint Neurologic:  Sensation intact distally Lymphatic:  No axillary or cervical lymphadenopathy  Orthopedic Exam:  Orthopedic examination is limited to the right shoulder and upper extremity.  There is an obvious deformity to the right shoulder as  X-rays:  Recent x-rays of the right shoulder are available for review and have been reviewed by myself.  The findings are as described above.  There is an obvious anterior dislocation of the right shoulder without evidence for fracture.  Diffuse osteopenia is noted throughout the visualized bony structures.  Assessment: Recurrent anterior dislocation right shoulder.  Plan: The treatment options, including both surgical and nonsurgical choices, have been discussed in detail with the patient and her daughter who is at the bedside.  The patient and her daughter agreed to proceed with surgical intervention to include a closed reduction of the dislocated right shoulder.  The risks (including bleeding, infection, nerve and/or blood vessel injury, persistent or recurrent pain, recurrent dislocation, fracture,  stiffness of the shoulder, need for further surgery, blood clots, strokes, heart attacks or arrhythmias, pneumonia, etc.) and benefits of the surgical procedure were discussed.  The patient and her daughter state their understanding and agree to proceed.  A formal written consent will be obtained by the nursing staff.  Thank you for asking me to participate in the care of this most pleasant unfortunate woman.  I will be happy to follow her with you.   Roberta Reyes Maltos, MD  Beeper #:  [phone]  03/12/2024 7:47 PM

## 2024-03-12 NOTE — Op Note (Signed)
 03/12/2024  9:33 PM  Patient:   Roberta Ross  Pre-Op Diagnosis:   Recurrent anterior dislocation, right shoulder.  Post-Op Diagnosis:   Same  Procedure:   Closed reduction right shoulder  Surgeon:   DOROTHA Reyes Maltos, MD  Assistant:   None  Anesthesia:   IV sedation  Findings:   As above.  Complications:   None  Fluids:   150 cc crystalloid  EBL:   0 cc  UOP:   None  TT:   None  Drains:   None  Closure:   None  Brief Clinical Note:   The patient is an 89 year old female who presented this afternoon to the emergency room with a recurrent dislocation of her right shoulder.  Despite several attempts by the ER provider, the shoulder could not be reduced.  Therefore, the patient is being brought to the operating room for an attempted closed reduction of the right shoulder with IV sedation.  Procedure:   The patient was brought into the operating room and laid in the supine position.  After a timeout was performed to verify the appropriate surgical site, adequate IV sedation was achieved.  The patient then underwent reduction of the shoulder using traction and countertraction.  As the shoulder was slowly abducted and externally rotated, it fell back into place.  The adequacy of reduction was verified using fluoroscopic imaging in both the AP and Grashey views.  The arm was placed into a shoulder immobilizer before the patient was awakened and returned to the recovery room in satisfactory condition after tolerating the procedure well.

## 2024-03-12 NOTE — H&P (Addendum)
    PATIENT NAME: Roberta Ross    MR#:  [id number]  DATE OF BIRTH:  09-27-1933  DATE OF ADMISSION:  03/12/2024  PRIMARY CARE PHYSICIAN: Lenon Layman ORN, MD   Patient is coming from: [facility]  REQUESTING/REFERRING PHYSICIAN: Claudene Rover, MD  CHIEF COMPLAINT:   Chief Complaint  Patient presents with   Shoulder Injury    HISTORY OF PRESENT ILLNESS:  Roberta Ross is a 89 y.o. Caucasian female with medical history significant for COPD, dyslipidemia, atrial fibrillation on Coumadin hypertension, rheumatic fever and osteoarthritis, who presented to the emergency room with acute onset of right shoulder pain.  Patient had a fall on Friday with subsequent right shoulder dislocation for which she was seen in the ER.  It was then reduced and she was given a sling and an appointment to see [facility] tomorrow.  Patient apparently took off her sling as it was bothering her and forgot and used her right arm with subsequent recurrent dislocation. ED Course: Upon presentation to the emergency room, vital signs were within normal.  Imaging: Right shoulder x-ray revealed anterior dislocation of the right glenohumeral joint with no definite fracture. EKG as reviewed by me : None  Patient was given 1 mg of IV Versed  at an 1% Xylocaine  injection, 0.5 mg of IV Dilaudid  and 50 mcg of IV fentanyl .  2 attempts at relocation did not succeed in the ER after which Dr. Edie was notified about the patient.  The patient was kept n.p.o. for intraoperative right shoulder reduction.  She will be admitted to an observation medical telemetry bed postoperatively for further evaluation and management.  PAST MEDICAL HISTORY:   Past Medical History:  Diagnosis Date   Anemia    past hx   Arthritis    hands and feet   Chronic kidney disease    COPD (chronic obstructive pulmonary disease) (HCC)    Dyspnea    Dysrhythmia    Pt reports slow HR and papatations    Heart murmur    past hx   Hypercholesteremia    Hypertension    Rheumatic fever    (x2) in past   Wears dentures    full upper    PAST SURGICAL HISTORY:   Past Surgical History:  Procedure Laterality Date   ABDOMINAL HYSTERECTOMY     APPENDECTOMY     as a child   CATARACT EXTRACTION W/PHACO Left 01/10/2015   Procedure: CATARACT EXTRACTION PHACO AND INTRAOCULAR LENS PLACEMENT (IOC);  Surgeon: Donzell Arlyce Budd, MD;  Location: [facility];  Service: Ophthalmology;  Laterality: Left;   COMPLEX WOUND CLOSURE Left 11/15/2021   Procedure: COMPLEX WOUND CLOSURE;  Surgeon: Edda Mt, MD;  Location: [facility];  Service: ENT;  Laterality: Left;   EXCISION MASS HEAD Left 11/15/2021   Procedure: EXCISION MALIGNANT MASS EAR;  Surgeon: Edda Mt, MD;  Location: [facility];  Service: ENT;  Laterality: Left;   INTRAMEDULLARY (IM) NAIL INTERTROCHANTERIC Left 03/28/2020   Procedure: INTRAMEDULLARY (IM) NAIL INTERTROCHANTRIC-Hip Fracture;  Surgeon: Tobie Priest, MD;  Location: [facility];  Service: Orthopedics;  Laterality: Left;   TONSILLECTOMY      SOCIAL HISTORY:   Social History   Tobacco Use   Smoking status: Former    Current packs/day: 0.00    Average packs/day: 0.3 packs/day for 60.0 years (15.0 ttl pk-yrs)    Types: Cigarettes    Start date: 60    Quit date: 2020    Years since  quitting: 5.8   Smokeless tobacco: Never  Substance Use Topics   Alcohol use: No    FAMILY HISTORY:   Family History  Problem Relation Age of Onset   Cervical cancer Mother    Heart attack Father    Prostate cancer Father    Asthma Brother     DRUG ALLERGIES:   Allergies  Allergen Reactions   Metoprolol Shortness Of Breath   Penicillins Other (See Comments)    Internal swelling including throat TOLERATES CEFTRIAXONE    Methotrexate And Trimetrexate Nausea Only   Doxycycline Hyclate Rash    REVIEW OF SYSTEMS:   ROS As per history of present illness. All pertinent  systems were reviewed above. Constitutional, HEENT, cardiovascular, respiratory, GI, GU, musculoskeletal, neuro, psychiatric, endocrine, integumentary and hematologic systems were reviewed and are otherwise negative/unremarkable except for positive findings mentioned above in the HPI.   MEDICATIONS AT HOME:   Prior to Admission medications   Medication Sig Start Date End Date Taking? Authorizing Provider  acetaminophen  (TYLENOL ) 650 MG CR tablet Take 650 mg by mouth 2 (two) times daily.   Yes [provider]  albuterol  (VENTOLIN  HFA) 108 (90 Base) MCG/ACT inhaler Inhale 2 puffs into the lungs every 4 (four) hours as needed for wheezing or shortness of breath. 05/03/22  Yes Sung, Jade J, MD  allopurinol (ZYLOPRIM) 100 MG tablet Take 0.5 tablets (50 mg total) by mouth daily. 02/14/24  Yes Alexander, Natalie, DO  ammonium lactate (LAC-HYDRIN) 12 % lotion Apply 1 Application topically daily.   Yes [provider]  diclofenac Sodium (VOLTAREN) 1 % GEL Apply 4 g topically 4 (four) times daily. Both knees   Yes [provider]  diltiazem  (CARDIZEM  LA) 240 MG 24 hr tablet Take 240 mg by mouth daily. 02/04/23  Yes [provider]  folic acid (FOLVITE) 400 MCG tablet Take 400 mcg by mouth in the morning.   Yes [provider]  melatonin 5 MG TABS Take 1 tablet (5 mg total) by mouth at bedtime. 02/13/24  Yes Alexander, Natalie, DO  oxyCODONE  (OXY IR/ROXICODONE ) 5 MG immediate release tablet Take 0.5-1 tablets (2.5-5 mg total) by mouth every 6 (six) hours as needed for moderate pain (pain score 4-6) or severe pain (pain score 7-10). Patient taking differently: Take 5 mg by mouth every 6 (six) hours as needed for moderate pain (pain score 4-6) or severe pain (pain score 7-10). 02/13/24  Yes Alexander, Natalie, DO  polyethylene glycol (MIRALAX  / GLYCOLAX ) 17 g packet Take 17 g by mouth daily.   Yes [provider]  predniSONE (DELTASONE) 5 MG tablet Take 5 mg by  mouth daily.   Yes [provider]  sennosides-docusate sodium  (SENOKOT-S) 8.6-50 MG tablet Take 2 tablets by mouth 2 (two) times daily.   Yes [provider]  sertraline (ZOLOFT) 50 MG tablet Take 50 mg by mouth daily. 02/03/24  Yes [provider]  simvastatin  (ZOCOR ) 10 MG tablet Take 1 tablet (10 mg total) by mouth every evening. 01/25/22 03/12/24 Yes Krishnan, Sendil K, MD  torsemide  (DEMADEX ) 20 MG tablet Take 1 tablet (20 mg total) by mouth every other day. 03/03/23  Yes Patel, Sona, MD  Vitamin D, Ergocalciferol, (DRISDOL) 1.25 MG (50000 UNIT) CAPS capsule Take 50,000 Units by mouth once a week. 08/19/23  Yes [provider]  warfarin (COUMADIN) 2 MG tablet Take 2 tablets (4 mg total) by mouth daily. INR check 02/14/24 and further dose adjustments based on that level. Goal 2-3 Patient taking  differently: Take 3 mg by mouth every evening. 02/13/24  Yes Alexander, Natalie, DO  Zinc Oxide 20 % PSTE Apply 1 application  topically 3 (three) times daily. Bottom/sacrum   Yes [provider]  SPIRIVA HANDIHALER 18 MCG inhalation capsule Place 18 mcg into inhaler and inhale daily as needed (shortness of breath). Patient not taking: Reported on 03/12/2024    [provider]      VITAL SIGNS:  Blood pressure 100/70, pulse 86, temperature 97.8 F (36.6 C), temperature source Oral, resp. rate 18, height 5' 5 (1.651 m), weight 66 kg, SpO2 96%.  PHYSICAL EXAMINATION:  Physical Exam  GENERAL:  89 y.o.-year-old Caucasian female patient lying in the bed with no acute distress.  EYES: Pupils equal, round, reactive to light and accommodation. No scleral icterus. Extraocular muscles intact.  HEENT: Head atraumatic, normocephalic. Oropharynx and nasopharynx clear.  NECK:  Supple, no jugular venous distention. No thyroid  enlargement, no tenderness.  LUNGS: Normal breath sounds bilaterally, no wheezing, rales,rhonchi or crepitation. No use of accessory  muscles of respiration.  CARDIOVASCULAR: Regular rate and rhythm, S1, S2 normal. No murmurs, rubs, or gallops.  ABDOMEN: Soft, nondistended, nontender. Bowel sounds present. No organomegaly or mass.  EXTREMITIES: No pedal edema, cyanosis, or clubbing.  NEUROLOGIC: Cranial nerves II through XII are intact. Muscle strength 5/5 in all extremities. Sensation intact. Gait not checked. Musculoskeletal: Right shoulder deformity. PSYCHIATRIC: The patient is alert and oriented x 3.  Normal affect and good eye contact. SKIN: No obvious rash, lesion, or ulcer.   LABORATORY PANEL:   CBC No results for input(s): WBC, HGB, HCT, PLT in the last 168 hours. ------------------------------------------------------------------------------------------------------------------  Chemistries  No results for input(s): NA, K, CL, CO2, GLUCOSE, BUN, CREATININE, CALCIUM, MG, AST, ALT, ALKPHOS, BILITOT in the last 168 hours.  Invalid input(s): GFRCGP ------------------------------------------------------------------------------------------------------------------  Cardiac Enzymes No results for input(s): TROPONINI in the last 168 hours. ------------------------------------------------------------------------------------------------------------------  RADIOLOGY:  DG Shoulder Right Result Date: 03/12/2024 EXAM: 1 VIEW XRAY OF THE RIGHT SHOULDER 03/12/2024 04:17:00 PM COMPARISON: Comparison 5 days ago. CLINICAL HISTORY: possible injury FINDINGS: BONES AND JOINTS: Anterior dislocation of the right glenohumeral joint is noted. No definite fracture is noted. The [facility] joint is unremarkable in appearance. SOFT TISSUES: No abnormal calcifications. Visualized lung is unremarkable. IMPRESSION: 1. Anterior dislocation of the right glenohumeral joint. 2. No definite fracture identified. Electronically signed by: Lynwood Seip MD 03/12/2024 04:21 PM EST RP Workstation: [id number]       IMPRESSION AND PLAN:  Assessment and Plan: * Anterior dislocation of right shoulder, initial encounter - The patient will be admitted to an observation medical telemetry bed. - She will be kept n.p.o. for intraoperative right shoulder reduction by Dr. Edie. - Pain management will be provided.  Dyslipidemia - Will continue statin therapy.  That should provide perioperative cardiovascular risk reduction.  Paroxysmal atrial fibrillation (HCC) - We will resume Coumadin. - Will continue Cardizem  CD. - Will follow INR.  Chronic obstructive pulmonary disease (COPD) (HCC) - Will continue bronchodilator therapy.  Depression - Will continue Zoloft.  Gout - Will continue allopurinol.   DVT prophylaxis: Coumadin postoperatively. Advanced Care Planning:  Code Status: The patient is DNR and DNI. Family Communication:  The plan of care was discussed in details with the patient (and family). I answered all questions. The patient agreed to proceed with the above mentioned plan. Further management will depend upon hospital course. Disposition Plan: Back to previous home environment Consults called: Ortho All the records are reviewed and case  discussed with ED provider.  Status is: Observation.  I certify that at the time of admission, it is my clinical judgment that the patient will require hospital care extending less than 2 midnights.                            Dispo: The patient is from: [facility]              Anticipated d/c is to: [facility]              Patient currently is not medically stable to d/c.              Difficult to place patient: No  Madison DELENA Peaches M.D on 03/12/2024 at 9:12 PM  [facility]   From 7 PM-7 AM, contact night-coverage [email or web address]  CC: Primary care physician; Lenon Layman ORN, MD

## 2024-03-12 NOTE — Assessment & Plan Note (Signed)
Will continue allopurinol. 

## 2024-03-12 NOTE — Assessment & Plan Note (Addendum)
-   The patient will be admitted to an observation medical telemetry bed. - She will be kept n.p.o. for intraoperative right shoulder reduction by Dr. Edie. - Pain management will be provided.

## 2024-03-12 NOTE — Assessment & Plan Note (Signed)
-   We will resume Coumadin. - Will continue Cardizem  CD. - Will follow INR.

## 2024-03-12 NOTE — Assessment & Plan Note (Signed)
Will continue Zoloft. 

## 2024-03-12 NOTE — ED Provider Notes (Signed)
 [facility] Provider Note    Event Date/Time   First MD Initiated Contact with Patient 03/12/24 1620     (approximate)   History   Shoulder Injury   HPI  Roberta Ross is a 89 y.o. female who presents to the ED for evaluation of Shoulder Injury   I reviewed ED visit from 5 days ago or patient was seen for right shoulder dislocation after a fall. Otherwise history of RA, chronic A-fib, CKD on Coumadin.  Patient presents to the ED via EMS from her local facility for evaluation of shoulder repeat dislocation.  No falls or injuries but she reports she took her sling off because it was uncomfortable and then it just came out.   Physical Exam   Triage Vital Signs: ED Triage Vitals  Encounter Vitals Group     BP 03/12/24 1544 100/70     Girls Systolic BP Percentile --      Girls Diastolic BP Percentile --      Boys Systolic BP Percentile --      Boys Diastolic BP Percentile --      Pulse Rate 03/12/24 1544 86     Resp 03/12/24 1544 18     Temp 03/12/24 1544 97.8 F (36.6 C)     Temp Source 03/12/24 1544 Oral     SpO2 03/12/24 1544 96 %     Weight 03/12/24 1542 145 lb 8.1 oz (66 kg)     Height 03/12/24 1542 5' 5 (1.651 m)     Head Circumference --      Peak Flow --      Pain Score 03/12/24 1541 8     Pain Loc --      Pain Education --      Exclude from Growth Chart --     Most recent vital signs: Vitals:   03/12/24 1544  BP: 100/70  Pulse: 86  Resp: 18  Temp: 97.8 F (36.6 C)  SpO2: 96%    General: Awake, no distress.  CV:  Good peripheral perfusion.  Resp:  Normal effort.  Abd:  No distention.  MSK:   Neuro:  No focal deficits appreciated. Other:  Anterior dislocation of right shoulder, closed   ED Results / Procedures / Treatments   Labs (all labs ordered are listed, but only abnormal results are displayed) Labs Reviewed  BASIC METABOLIC PANEL WITH GFR  CBC    EKG   RADIOLOGY Plain film of the right  shoulder interpreted by me with anterior dislocation  Official radiology report(s): DG Shoulder Right Result Date: 03/12/2024 EXAM: 1 VIEW XRAY OF THE RIGHT SHOULDER 03/12/2024 04:17:00 PM COMPARISON: Comparison 5 days ago. CLINICAL HISTORY: possible injury FINDINGS: BONES AND JOINTS: Anterior dislocation of the right glenohumeral joint is noted. No definite fracture is noted. The [facility] joint is unremarkable in appearance. SOFT TISSUES: No abnormal calcifications. Visualized lung is unremarkable. IMPRESSION: 1. Anterior dislocation of the right glenohumeral joint. 2. No definite fracture identified. Electronically signed by: Lynwood Seip MD 03/12/2024 04:21 PM EST RP Workstation: [id number]    PROCEDURES and INTERVENTIONS:  .Ortho Injury Treatment  Date/Time: 03/12/2024 7:45 PM  Performed by: Claudene Rover, MD Authorized by: Claudene Rover, MD   Consent:    Consent obtained:  Verbal   Consent given by:  Patient (daughter)Injury location: shoulder Location details: right shoulder Injury type: dislocation Dislocation type: anterior Hill-Sachs deformity: no Chronicity: recurrent Pre-procedure neurovascular assessment: neurovascularly intact Pre-procedure distal perfusion: normal Pre-procedure neurological function: normal  Pre-procedure range of motion: reduced  Patient sedated: NoManipulation performed: yes Reduction method: external rotation, Stimson maneuver, scapular manipulation and traction and counter traction Reduction successful: no   Injection of joint  Date/Time: 03/12/2024 7:46 PM  Performed by: Claudene Rover, MD Authorized by: Claudene Rover, MD  Risks and benefits: risks, benefits and alternatives were discussed Patient tolerance: patient tolerated the procedure well with no immediate complications Comments: 10 cc of plain 1% lidocaine  to the right sided glenohumeral joint for analgesia     Medications  0.9 %  sodium chloride  infusion (has no administration in time  range)  acetaminophen  (TYLENOL ) tablet 650 mg (has no administration in time range)    Or  acetaminophen  (TYLENOL ) suppository 650 mg (has no administration in time range)  traZODone (DESYREL) tablet 25 mg (has no administration in time range)  magnesium  hydroxide (MILK OF MAGNESIA) suspension 30 mL (has no administration in time range)  ondansetron  (ZOFRAN ) tablet 4 mg (has no administration in time range)    Or  ondansetron  (ZOFRAN ) injection 4 mg (has no administration in time range)  lidocaine  (PF) (XYLOCAINE ) 1 % injection 10 mL (10 mLs Infiltration Given 03/12/24 1649)  HYDROmorphone  (DILAUDID ) injection 0.5 mg (0.5 mg Intravenous Given 03/12/24 1649)  fentaNYL  (SUBLIMAZE ) injection 50 mcg (50 mcg Intravenous Given 03/12/24 1716)  midazolam  PF (VERSED ) injection 1 mg (1 mg Intravenous Given 03/12/24 1739)     IMPRESSION / MDM / ASSESSMENT AND PLAN / ED COURSE  I reviewed the triage vital signs and the nursing notes.  Differential diagnosis includes, but is not limited to, fracture, dislocation, open injury  {Patient presents with symptoms of an acute illness or injury that is potentially life-threatening.  Patient presents to the ED with recurrent anterior right shoulder dislocation.  Traumatically dislocated 5 days ago after a fall, took her sling off today and without additional injury or significant mechanism repeat closed anterior dislocation.  I start with 1% intra-articular lidocaine  10 cc which is well-tolerated and improves her pain.  IV Dilaudid , fentanyl  and Versed  subsequently provided and ultimately unable to reduce shoulder dislocation.  Repeated attempts over the course of 2 hours with various techniques and other staff assisting me but I cannot reduce her shoulder.  We have provided enough analgesia/anxiolytics to the point that she requires nasal cannula supplemental O2 due to intermittent hypoxia, quickly resolved with this nasal cannula and stimulation, but I do  not feel that I can provide any additional analgesia or sedation safely in the ED.  I consult Ortho who agrees to take the patient to the OR for more controlled reduction with anesthesia.  Consult with medicine for subsequent observation admission  Clinical Course as of 03/12/24 1949  Thu Mar 12, 2024  1642 Lido intraarticular [DS]  1712 1st attempt [DS]  1733 Tried again, but she is not tolerating it well. Very anxious regarding pain.  [DS]  8084 I consult with Dr. Edie [DS]    Clinical Course User Index [DS] Claudene Rover, MD     FINAL CLINICAL IMPRESSION(S) / ED DIAGNOSES   Final diagnoses:  Anterior dislocation of right shoulder, initial encounter     Rx / DC Orders   ED Discharge Orders     None        Note:  This document was prepared using Dragon voice recognition software and may include unintentional dictation errors.   Claudene Rover, MD 03/12/24 [phone]

## 2024-03-12 NOTE — Transfer of Care (Signed)
 Immediate Anesthesia Transfer of Care Note  Patient: Roberta Ross  Procedure(s) Performed: CLOSED REDUCTION, SHOULDER (Right: Shoulder)  Patient Location: PACU  Anesthesia Type:General  Level of Consciousness: awake and alert   Airway & Oxygen Therapy: Patient Spontanous Breathing and Patient connected to nasal cannula oxygen  Post-op Assessment: Report given to RN and Post -op Vital signs reviewed and stable  Post vital signs: Reviewed and stable  Last Vitals:  Vitals Value Taken Time  BP    Temp    Pulse 98 03/12/24 21:33  Resp 21 03/12/24 21:34  SpO2 100 % 03/12/24 21:33  Vitals shown include unfiled device data.  Last Pain:  Vitals:   03/12/24 1844  TempSrc:   PainSc: 1          Complications: No notable events documented.

## 2024-03-12 NOTE — ED Notes (Signed)
 See triage note  Presents with family  Per family she had a fall  and had a dislocated shoulder  She took the sling off and the then the shoulder slipped out

## 2024-03-12 NOTE — Anesthesia Preprocedure Evaluation (Addendum)
 Anesthesia Evaluation  Patient identified by MRN, date of birth, ID band Patient awake    Reviewed: Allergy & Precautions, NPO status , Patient's Chart, lab work & pertinent test results  History of Anesthesia Complications Negative for: history of anesthetic complications  Airway Mallampati: III  TM Distance: <3 FB Neck ROM: full    Dental  (+) Chipped, Poor Dentition, Missing, Loose, Upper Dentures   Pulmonary shortness of breath and with exertion, COPD, former smoker    + decreased breath sounds      Cardiovascular hypertension, +CHF  + dysrhythmias Atrial Fibrillation + Valvular Problems/Murmurs  Rhythm:irregular Rate:Tachycardia     Neuro/Psych negative neurological ROS  negative psych ROS   GI/Hepatic negative GI ROS, Neg liver ROS,neg GERD  ,,  Endo/Other  negative endocrine ROS    Renal/GU Renal disease  negative genitourinary   Musculoskeletal   Abdominal   Peds  Hematology negative hematology ROS (+)   Anesthesia Other Findings Past Medical History: No date: Anemia     Comment:  past hx No date: Arthritis     Comment:  hands and feet No date: Chronic kidney disease No date: COPD (chronic obstructive pulmonary disease) (HCC) No date: Dyspnea No date: Dysrhythmia     Comment:  Pt reports slow HR and papatations No date: Heart murmur     Comment:  past hx No date: Hypercholesteremia No date: Hypertension No date: Rheumatic fever     Comment:  (x2) in past No date: Wears dentures     Comment:  full upper  Past Surgical History: No date: ABDOMINAL HYSTERECTOMY No date: APPENDECTOMY     Comment:  as a child 01/10/2015: CATARACT EXTRACTION W/PHACO; Left     Comment:  Procedure: CATARACT EXTRACTION PHACO AND INTRAOCULAR               LENS PLACEMENT (IOC);  Surgeon: Donzell Arlyce Budd,              MD;  Location: [facility];  Service:               Ophthalmology;  Laterality:  Left; 11/15/2021: COMPLEX WOUND CLOSURE; Left     Comment:  Procedure: COMPLEX WOUND CLOSURE;  Surgeon: Edda Mt, MD;  Location: [facility] ORS;  Service: ENT;                Laterality: Left; 11/15/2021: EXCISION MASS HEAD; Left     Comment:  Procedure: EXCISION MALIGNANT MASS EAR;  Surgeon:               Edda Mt, MD;  Location: [facility] ORS;  Service: ENT;                Laterality: Left; 03/28/2020: INTRAMEDULLARY (IM) NAIL INTERTROCHANTERIC; Left     Comment:  Procedure: INTRAMEDULLARY (IM) NAIL INTERTROCHANTRIC-Hip              Fracture;  Surgeon: Tobie Priest, MD;  Location: [facility]               ORS;  Service: Orthopedics;  Laterality: Left; No date: TONSILLECTOMY  BMI    Body Mass Index: 24.21 kg/m      Reproductive/Obstetrics negative OB ROS                              Anesthesia Physical Anesthesia Plan  ASA: 3  Anesthesia Plan: General   Post-op Pain Management:    Induction: Intravenous  PONV Risk Score and Plan: Propofol  infusion and TIVA  Airway Management Planned: Natural Airway and Nasal Cannula  Additional Equipment:   Intra-op Plan:   Post-operative Plan:   Informed Consent: I have reviewed the patients History and Physical, chart, labs and discussed the procedure including the risks, benefits and alternatives for the proposed anesthesia with the patient or authorized representative who has indicated his/her understanding and acceptance.   Patient has DNR.  Discussed DNR with patient.   Dental Advisory Given  Plan Discussed with: Anesthesiologist, CRNA and Surgeon  Anesthesia Plan Comments: (Partial DNR suspension. She assents to intubation for the procedure but declines CPR if needed  Patient consented for risks of anesthesia including but not limited to:  - adverse reactions to medications - risk of airway placement if required - damage to eyes, teeth, lips or other oral mucosa - nerve damage due to  positioning  - sore throat or hoarseness - Damage to heart, brain, nerves, lungs, other parts of body or loss of life  Patient voiced understanding and assent.)         Anesthesia Quick Evaluation

## 2024-03-13 ENCOUNTER — Encounter: Payer: Self-pay | Admitting: Surgery

## 2024-03-13 DIAGNOSIS — I480 Paroxysmal atrial fibrillation: Secondary | ICD-10-CM | POA: Diagnosis not present

## 2024-03-13 DIAGNOSIS — J449 Chronic obstructive pulmonary disease, unspecified: Secondary | ICD-10-CM | POA: Diagnosis not present

## 2024-03-13 DIAGNOSIS — S43014A Anterior dislocation of right humerus, initial encounter: Secondary | ICD-10-CM | POA: Diagnosis not present

## 2024-03-13 LAB — CBC
HCT: 36.8 % (ref 36.0–46.0)
Hemoglobin: 11.5 g/dL — ABNORMAL LOW (ref 12.0–15.0)
MCH: 31.7 pg (ref 26.0–34.0)
MCHC: 31.3 g/dL (ref 30.0–36.0)
MCV: 101.4 fL — ABNORMAL HIGH (ref 80.0–100.0)
Platelets: 196 K/uL (ref 150–400)
RBC: 3.63 MIL/uL — ABNORMAL LOW (ref 3.87–5.11)
RDW: 13.9 % (ref 11.5–15.5)
WBC: 7.3 K/uL (ref 4.0–10.5)
nRBC: 0 % (ref 0.0–0.2)

## 2024-03-13 LAB — BASIC METABOLIC PANEL WITH GFR
Anion gap: 10 (ref 5–15)
BUN: 18 mg/dL (ref 8–23)
CO2: 28 mmol/L (ref 22–32)
Calcium: 9.3 mg/dL (ref 8.9–10.3)
Chloride: 104 mmol/L (ref 98–111)
Creatinine, Ser: 1.32 mg/dL — ABNORMAL HIGH (ref 0.44–1.00)
GFR, Estimated: 39 mL/min — ABNORMAL LOW (ref >60)
Glucose, Bld: 137 mg/dL — ABNORMAL HIGH (ref 70–99)
Potassium: 3.7 mmol/L (ref 3.5–5.1)
Sodium: 142 mmol/L (ref 135–145)

## 2024-03-13 LAB — PROTIME-INR
INR: 2.8 — ABNORMAL HIGH (ref 0.8–1.2)
Prothrombin Time: 30.6 s — ABNORMAL HIGH (ref 11.4–15.2)

## 2024-03-13 MED ORDER — WARFARIN - PHARMACIST DOSING INPATIENT: Freq: Every day | Status: DC

## 2024-03-13 MED ORDER — SODIUM CHLORIDE 0.9 % IV BOLUS
250.0000 mL | Freq: Once | INTRAVENOUS | Status: AC
  Administered 2024-03-13: 250 mL via INTRAVENOUS

## 2024-03-13 MED ORDER — WARFARIN SODIUM 3 MG PO TABS
3.0000 mg | ORAL_TABLET | Freq: Once | ORAL | Status: AC
  Administered 2024-03-13: 3 mg via ORAL
  Filled 2024-03-13: qty 1

## 2024-03-13 NOTE — TOC CM/SW Note (Signed)
 Transition of Care [facility]) CM/SW Note   Transition of Care [facility]) - Inpatient Brief Assessment   Patient Details  Name: Roberta Ross MRN: [id number] Date of Birth: November 14, 1933  Transition of Care [facility]) CM/SW Contact:    Alvaro Louder, LCSW Phone Number: 03/13/2024, 9:36 AM   Clinical Narrative:   Per chart review TOC consulted for SNF Placement/ Home health. LCSWA to follow recommendations placed by PT and OT.  TOC to follow for discharge  Transition of Care Asessment: Insurance and Status: Insurance coverage has been reviewed Patient has primary care physician: Yes Home environment has been reviewed: Single family home Prior level of function:: Ox4 Prior/Current Home Services: No current home services Social Drivers of Health Review: SDOH reviewed no interventions necessary Readmission risk has been reviewed: Yes Transition of care needs: no transition of care needs at this time

## 2024-03-13 NOTE — Plan of Care (Signed)
  Problem: Education: Goal: Knowledge of General Education information will improve Description: Including pain rating scale, medication(s)/side effects and non-pharmacologic comfort measures 03/13/2024 0018 by Lynnae Bitters, RN Outcome: Progressing 03/13/2024 0014 by Lynnae Bitters, RN Outcome: Progressing   Problem: Activity: Goal: Risk for activity intolerance will decrease Outcome: Progressing   Problem: Pain Managment: Goal: General experience of comfort will improve and/or be controlled 03/13/2024 0018 by Lynnae Bitters, RN Outcome: Progressing 03/13/2024 0014 by Lynnae Bitters, RN Outcome: Progressing   Problem: Safety: Goal: Ability to remain free from injury will improve 03/13/2024 0018 by Lynnae Bitters, RN Outcome: Progressing 03/13/2024 0014 by Lynnae Bitters, RN Outcome: Progressing   Problem: Skin Integrity: Goal: Risk for impaired skin integrity will decrease 03/13/2024 0018 by Lynnae Bitters, RN Outcome: Progressing 03/13/2024 0014 by Lynnae Bitters, RN Outcome: Progressing

## 2024-03-13 NOTE — Plan of Care (Signed)

## 2024-03-13 NOTE — Evaluation (Signed)
 Physical Therapy Evaluation Patient Details Name: Roberta Ross MRN: [id number] DOB: 08-Jul-1933 Today's Date: 03/13/2024  History of Present Illness  Roberta Ross is an 89yoF who comes to [facility] after fall at memory care, noted Rt humeral anterior dislocation, recuction in ED unsuccessful, hence orthopedics took pt to OR for closed reduction under anesthesia. Pt here just a few days prior s/p fall and shoulder dislocation. PMH: AF on eliquis , HTN, CHF, COPD, DVT on eliquis .  Clinical Impression  Pt awake, calm, no reported shoulder pain. Pt hesitant to mobilize, but author explained the situation and then all was copacetic. Pt does well transfer to Left EOB (HOB elevated), some assist provided for energy conservation given current RVR aggravation with rates in 130s periodically. Pt remarks typically sleeping in recliner at night, hence her bed mobility is not well rehearsed. Pt able to rise to standing pushing off bedside (declines a hand), but does require LUE support to maintain standing steadiness. AMB declined at present, mostly due to HR, but pt will needs quite a bit of time with staff/rehab to achieve meaningful AMB distances in future, as she will require a different assistive device in her nondominant hand and has baseline cognitive impairment- pt asked to not attempt managing a RW with 1 arm only, not appropriate or safe. Pt denies any dizziness or pain within session, assisted back to be dat her request, HOB at 35 degrees, SCD in place and activated. Pt likely will need STR at DC prior to returning to memory care, otherwise may also need a WC for supervised or assisted mobility (c 2 leg propulsion). Will continue to follow.       If plan is discharge home, recommend the following: A lot of help with bathing/dressing/bathroom;Two people to help with walking and/or transfers;Direct supervision/assist for medications management;Assist for transportation   Can travel by  private vehicle   No    Equipment Recommendations None recommended by PT  Recommendations for Other Services       Functional Status Assessment Patient has had a recent decline in their functional status and demonstrates the ability to make significant improvements in function in a reasonable and predictable amount of time.     Precautions / Restrictions Precautions Precautions: Fall;Shoulder Shoulder Interventions: Shoulder sling/immobilizer Precaution Booklet Issued: No Recall of Precautions/Restrictions: Impaired Restrictions Weight Bearing Restrictions Per Provider Order: Yes RUE Weight Bearing Per Provider Order: Non weight bearing      Mobility  Bed Mobility Overal bed mobility: Needs Assistance Bed Mobility: Supine to Sit, Sit to Supine     Supine to sit: Supervision Sit to supine: Supervision   General bed mobility comments: minA provided for pain control, exertion control given elevated HR    Transfers Overall transfer level: Needs assistance Equipment used: None Transfers: Sit to/from Stand Sit to Stand: Contact guard assist           General transfer comment: slow, careful rise to standing using LUE push off EOB, then pt grabs author's arm for support.    Ambulation/Gait Ambulation/Gait assistance:  (deferred due to HR, and lack of appropriate device in room;)                Stairs            Wheelchair Mobility     Tilt Bed    Modified Rankin (Stroke Patients Only)       Balance  Pertinent Vitals/Pain Pain Assessment Pain Assessment: No/denies pain    Home Living Family/patient expects to be discharged to:: Assisted living (memory care)                 Home Equipment: Agricultural Consultant (2 wheels)      Prior Function                       Extremity/Trunk Assessment                Communication        Cognition Arousal:  Alert Behavior During Therapy: WFL for tasks assessed/performed   PT - Cognitive impairments: History of cognitive impairments                                 Cueing       General Comments      Exercises     Assessment/Plan    PT Assessment Patient needs continued PT services  PT Problem List Decreased strength;Decreased range of motion;Decreased activity tolerance;Decreased balance;Decreased mobility;Decreased cognition;Decreased knowledge of use of DME;Decreased safety awareness;Decreased knowledge of precautions       PT Treatment Interventions DME instruction;Therapeutic exercise;Gait training;Balance training;Stair training;Neuromuscular re-education;Functional mobility training;Cognitive remediation;Patient/family education;Therapeutic activities    PT Goals (Current goals can be found in the Care Plan section)  Acute Rehab PT Goals PT Goal Formulation: Patient unable to participate in goal setting    Frequency Min 2X/week     Co-evaluation               AM-PAC PT 6 Clicks Mobility  Outcome Measure Help needed turning from your back to your side while in a flat bed without using bedrails?: A Little Help needed moving from lying on your back to sitting on the side of a flat bed without using bedrails?: A Little Help needed moving to and from a bed to a chair (including a wheelchair)?: A Lot Help needed standing up from a chair using your arms (e.g., wheelchair or bedside chair)?: A Lot Help needed to walk in hospital room?: A Lot Help needed climbing 3-5 steps with a railing? : A Lot 6 Click Score: 14    End of Session   Activity Tolerance: Patient tolerated treatment well;No increased pain;Treatment limited secondary to medical complications (Comment) Patient left: in bed;with call bell/phone within reach;with bed alarm set;with SCD's reapplied   PT Visit Diagnosis: History of falling (Z91.81);Other abnormalities of gait and mobility  (R26.89);Difficulty in walking, not elsewhere classified (R26.2)    Time: 8877-8861 PT Time Calculation (min) (ACUTE ONLY): 16 min   Charges:   PT Evaluation $PT Eval Moderate Complexity: 1 Mod PT Treatments $Therapeutic Activity: 8-22 mins PT General Charges $$ ACUTE PT VISIT: 1 Visit       12:09 PM, 03/13/24 Peggye JAYSON Linear, PT, DPT Physical Therapist - [facility]  [phone] (ASCOM)    Quandre Polinski C 03/13/2024, 12:01 PM

## 2024-03-13 NOTE — Progress Notes (Signed)
 PHARMACY - ANTICOAGULATION CONSULT NOTE  Pharmacy Consult for warfarin Indication: atrial fibrillation and DVT  Allergies  Allergen Reactions   Metoprolol Shortness Of Breath   Penicillins Other (See Comments)    Internal swelling including throat TOLERATES CEFTRIAXONE    Methotrexate And Trimetrexate Nausea Only   Doxycycline Hyclate Rash    Patient Measurements: Height: 5' 5 (165.1 cm) Weight: 66 kg (145 lb 8.1 oz) IBW/kg (Calculated) : 57 HEPARIN  DW (KG): 66  Vital Signs: Temp: 98.2 F (36.8 C) (11/14 0619) BP: 108/83 (11/14 0619) Pulse Rate: 71 (11/14 0619)  Labs: Recent Labs    03/12/24 2252 03/13/24 0313  HGB 11.1* 11.5*  HCT 35.9* 36.8  PLT 189 196  LABPROT 28.9*  --   INR 2.6*  --   CREATININE 1.29* 1.32*    Estimated Creatinine Clearance: 27 mL/min (A) (by C-G formula based on SCr of 1.32 mg/dL (H)).   Medical History: Past Medical History:  Diagnosis Date   Anemia    past hx   Arthritis    hands and feet   Chronic kidney disease    COPD (chronic obstructive pulmonary disease) (HCC)    Dyspnea    Dysrhythmia    Pt reports slow HR and papatations   Heart murmur    past hx   Hypercholesteremia    Hypertension    Rheumatic fever    (x2) in past   Wears dentures    full upper   Assessment: 89 y/o female presenting with right shoulder pain. 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 Pharmacy consulted to continue home warfarin and manage while inpatient.   Home warfarin regimen:  3 mg daily (total weekly dose of 21 mg)  Last warfarin dose: 3 mg on 03/12/24 in evening Baseline labs: hgb 11.5, plt 196 DDI: on simvastatin , sertraline and diltiazem  PTA (can increase bleeding risk)  Date INR Warfarin Dose 11/13 2.6 3 mg 11/14 2.8 3 mg  Goal of Therapy:  INR 2-3 Monitor platelets by anticoagulation protocol: Yes   Plan:  Give warfarin 3 mg x1 today (home dose) Recheck INR in AM and  daily while inpatient Monitor CBC and signs/symptoms of bleeding  Thank you for involving pharmacy in this patient's care.   Damien Napoleon, PharmD Clinical Pharmacist 03/13/2024 7:37 AM

## 2024-03-13 NOTE — Anesthesia Postprocedure Evaluation (Signed)
 Anesthesia Post Note  Patient: Emmy Keng  Procedure(s) Performed: CLOSED REDUCTION, SHOULDER (Right: Shoulder)  Patient location during evaluation: PACU Anesthesia Type: General Level of consciousness: awake and alert Pain management: pain level controlled Vital Signs Assessment: post-procedure vital signs reviewed and stable Respiratory status: spontaneous breathing, nonlabored ventilation and respiratory function stable Cardiovascular status: blood pressure returned to baseline and stable Postop Assessment: no apparent nausea or vomiting Anesthetic complications: no   No notable events documented.   Last Vitals:  Vitals:   03/12/24 2307 03/13/24 0619  BP: 114/73 108/83  Pulse: 92 71  Resp: 18 18  Temp: 36.7 C 36.8 C  SpO2: 92% (!) 82%    Last Pain:  Vitals:   03/12/24 2200  TempSrc:   PainSc: 0-No pain                 Fairy POUR Florencia Zaccaro

## 2024-03-13 NOTE — TOC Progression Note (Signed)
 Transition of Care [facility]) - Progression Note    Patient Details  Name: Roberta Ross MRN: [id number] Date of Birth: 02-Jun-1933  Transition of Care [facility]) CM/SW Contact  Nathanael CHRISTELLA Ring, RN Phone Number: 03/13/2024, 3:59 PM  Clinical Narrative:    Met with patient at the bedside, she is alert and oriented, able to tell me what happened when she fell yesterday.  She was walking without her walker even though she knew she shouldn't and she fell.  She would like to go back to [place] but currently the recommendation is SNF.  SW, Candia called [facility] and they did say that patient needed to go to SNF before returning to the ALF.  Patient reluctantly agrees, she has been to [facility] in the past her daughter reports that they did a wonderful job with her.  Daughter [name] is okay with her going back to [place].  Patient agrees.  Insurance auth to be started.   Encouraged patient to cont to work with therapy and if there recommendations changed to [facility] she would be able to return to [facility].  Bed offer with [name] accepted.     Expected Discharge Plan: Skilled Nursing Facility Barriers to Discharge: Continued Medical Work up, English As A Second Language Teacher               Expected Discharge Plan and Services     Post Acute Care Choice: Skilled Nursing Facility Living arrangements for the past 2 months: Assisted Living Facility                                       Social Drivers of Health (SDOH) Interventions SDOH Screenings   Food Insecurity: No Food Insecurity (03/13/2024)  Housing: Low Risk  (03/13/2024)  Transportation Needs: No Transportation Needs (03/13/2024)  Utilities: Not At Risk (03/13/2024)  Social Connections: Unknown (03/13/2024)  Recent Concern: Social Connections - Socially Isolated (03/13/2024)  Tobacco Use: Medium Risk (03/12/2024)    Readmission Risk Interventions     No data to display

## 2024-03-13 NOTE — Hospital Course (Addendum)
 Roberta Ross is a 89 y.o. Caucasian female with medical history significant for COPD, dyslipidemia, atrial fibrillation on Coumadin hypertension, rheumatic fever and osteoarthritis, who presented to the emergency room with acute onset of right shoulder pain.  Patient had a fall on Friday with subsequent right shoulder dislocation for which she was seen in the ER.  It was then reduced and she was given a sling and an appointment to see [facility] tomorrow.  Patient apparently took off her sling as it was bothering her and forgot and used her right arm with subsequent recurrent dislocation. Patient had closed reduction of dislocation of left shoulder in the OR on 11/13. She was seen by PT/OT, deemed not able to go back to memory unit, need nursing placement.

## 2024-03-13 NOTE — TOC Initial Note (Signed)
 Transition of Care [facility]) - Initial/Assessment Note    Patient Details  Name: Roberta Ross MRN: [id number] Date of Birth: 25-May-1933  Transition of Care [facility]) CM/SW Contact:    Alvaro Louder, LCSW Phone Number: 03/13/2024, 12:25 PM  Clinical Narrative:     Per chart review patient is from [place] PCP is Layman Piety LCSWA faxed out patient to SNF's in [place]. TOC to present facilities to patient at the bedside.             TOC to follow for discharge.   Expected Discharge Plan: Skilled Nursing Facility Barriers to Discharge: Continued Medical Work up, English As A Second Language Teacher   Patient Goals and CMS Choice            Expected Discharge Plan and Services     Post Acute Care Choice: Skilled Nursing Facility Living arrangements for the past 2 months: Assisted Living Facility                                      Prior Living Arrangements/Services Living arrangements for the past 2 months: Assisted Living Facility Lives with:: Facility Resident Patient language and need for interpreter reviewed:: Yes Do you feel safe going back to the place where you live?: Yes      Need for Family Participation in Patient Care: Yes (Comment) Care giver support system in place?: Yes (comment)   Criminal Activity/Legal Involvement Pertinent to Current Situation/Hospitalization: No - Comment as needed  Activities of Daily Living   ADL Screening (condition at time of admission) Independently performs ADLs?: No Does the patient have a NEW difficulty with bathing/dressing/toileting/self-feeding that is expected to last >3 days?: No Does the patient have a NEW difficulty with getting in/out of bed, walking, or climbing stairs that is expected to last >3 days?: Yes (Initiates electronic notice to provider for possible PT consult) Does the patient have a NEW difficulty with communication that is expected to last >3 days?: No Is the patient deaf or have  difficulty hearing?: No Does the patient have difficulty seeing, even when wearing glasses/contacts?: No Does the patient have difficulty concentrating, remembering, or making decisions?: No  Permission Sought/Granted                  Emotional Assessment Appearance:: Appears stated age Attitude/Demeanor/Rapport: Engaged Affect (typically observed): Appropriate Orientation: : Oriented to Self, Oriented to Place, Oriented to  Time, Oriented to Situation      Admission diagnosis:  Anterior dislocation of right shoulder, initial encounter [S43.014A] Patient Active Problem List   Diagnosis Date Noted   Anterior dislocation of right shoulder, initial encounter 03/12/2024   Dyslipidemia 03/12/2024   Gout 03/12/2024   Paroxysmal atrial fibrillation (HCC) 03/12/2024   Chronic obstructive pulmonary disease (COPD) (HCC) 03/12/2024   Depression 03/12/2024   Acute metabolic encephalopathy 02/10/2024   Acute gout 02/09/2024   Acute kidney injury superimposed on CKD 02/08/2024   Pressure injury of skin 02/08/2024   Decubitus ulcer of right buttock, stage 2 (HCC) 02/08/2024   Acute DVT (deep venous thrombosis) (HCC) 02/07/2024   DVT (deep venous thrombosis) (HCC) 01/26/2024   Abdominal pain 02/26/2023   Mycoplasma pneumoniae pneumonia 02/22/2023   COVID-19 virus infection 02/15/2023   COVID 02/15/2023   Rheumatoid arthritis (HCC) 01/24/2022   CAP (community acquired pneumonia) 01/21/2022   Sepsis (HCC) secondary to community-acquired pneumonia and UTI 01/21/2022   Acute on chronic  systolic CHF (congestive heart failure) (HCC) 01/21/2022   UTI (urinary tract infection) 01/21/2022   HLD (hyperlipidemia) 01/21/2022   CKD stage 3b, GFR 30-44 ml/min (HCC) 01/21/2022   Sepsis due to pneumonia [facility])    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   Closed fracture of femur, intertrochanteric, left, initial encounter (HCC) 03/27/2020   Accidental fall 03/27/2020   Preoperative clearance  03/27/2020   Closed left hip fracture, initial encounter (HCC) 03/27/2020   Simple chronic bronchitis (HCC) 09/01/2018   Atrial fibrillation, chronic (HCC) 09/16/2017   History of subarachnoid hemorrhage 07/21/2017   Prediabetes 03/21/2017   GAD (generalized anxiety disorder) 02/01/2016   Hypercalcemia 02/01/2016   Essential hypertension 01/04/2015   Stage 3b chronic kidney disease (HCC) 08/24/2014   Pure hypercholesterolemia 02/09/2014   H/O rheumatoid arthritis 10/20/2013   PCP:  Lenon Layman ORN, MD Pharmacy:   [id number], [place] - [address] AT [facility] OF [address] [facility] [place] [address] Phone: [phone] Fax: [phone]  [facility] #[id number] [place], [place] - [address] AT [facility] OF [address] [place] [address] Phone: [phone] Fax: [phone]  [facility] [address] [place] [address] Phone: [phone] Fax: [phone]     Social Drivers of Health (SDOH) Social History: SDOH Screenings   Food Insecurity: No Food Insecurity (03/13/2024)  Housing: Low Risk  (03/13/2024)  Transportation Needs: No Transportation Needs (03/13/2024)  Utilities: Not At Risk (03/13/2024)  Social Connections: Unknown (03/13/2024)  Recent Concern: Social Connections - Socially Isolated (03/13/2024)  Tobacco Use: Medium Risk (03/12/2024)   SDOH Interventions:     Readmission Risk Interventions     No data to display

## 2024-03-13 NOTE — Progress Notes (Signed)
  Progress Note   Patient: Roberta Ross FMW:[id number] DOB: Nov 30, 1933 DOA: 03/12/2024     0 DOS: the patient was seen and examined on 03/13/2024   Brief hospital course: Roberta Ross is a 89 y.o. Caucasian female with medical history significant for COPD, dyslipidemia, atrial fibrillation on Coumadin hypertension, rheumatic fever and osteoarthritis, who presented to the emergency room with acute onset of right shoulder pain.  Patient had a fall on Friday with subsequent right shoulder dislocation for which she was seen in the ER.  It was then reduced and she was given a sling and an appointment to see [facility] tomorrow.  Patient apparently took off her sling as it was bothering her and forgot and used her right arm with subsequent recurrent dislocation. Patient had closed reduction of dislocation of left shoulder in the OR on 11/13. She was seen by PT/OT, deemed not able to go back to memory unit, need nursing placement.   Principal Problem:   Anterior dislocation of right shoulder, initial encounter Active Problems:   Dyslipidemia   Paroxysmal atrial fibrillation (HCC)   Chronic obstructive pulmonary disease (COPD) (HCC)   Gout   Depression   Assessment and Plan: * Anterior dislocation of right shoulder, initial encounter Status post closed reduction. Seen by PT/OT, patient could not ambulate safely to go back to memory unit.  Recommended nursing home placement.  TOC is aware.  Chronic Kidney disease disease stage IIIb. Renal function still stable.  Dyslipidemia Continue home medicines.  Paroxysmal atrial fibrillation (HCC) Continue Cardizem , pharmacy to dose warfarin.  Chronic obstructive pulmonary disease (COPD) (HCC) No exacerbation.  Depression - Will continue Zoloft.  Gout - Will continue allopurinol.       Subjective:  Patient still complain pain in her shoulder.    Physical Exam: Vitals:   03/12/24 2200 03/12/24 2307 03/13/24  0619 03/13/24 0634  BP: 108/77 114/73 108/83   Pulse: 80 92 71   Resp: 15 18 18    Temp:  98 F (36.7 C) 98.2 F (36.8 C)   TempSrc:      SpO2: 99% 92% (!) 82% 95%  Weight:      Height:       General exam: Appears calm and comfortable  Respiratory system: Clear to auscultation. Respiratory effort normal. Cardiovascular system: S1 & S2 heard, RRR. No JVD, murmurs, rubs, gallops or clicks. No pedal edema. Gastrointestinal system: Abdomen is nondistended, soft and nontender. No organomegaly or masses felt. Normal bowel sounds heard. Central nervous system: Alert and oriented x2. No focal neurological deficits. Extremities: Symmetric 5 x 5 power. Skin: No rashes, lesions or ulcers Psychiatry: Mood & affect appropriate.    Data Reviewed:  Lab results reviewed.  Family Communication: Daughter updated via phone.  Disposition: Status is: Observation Need nursing home placement     Time spent: 35 minutes  Author: Murvin Mana, MD 03/13/2024 12:16 PM  For on call review [email or web address].

## 2024-03-13 NOTE — Progress Notes (Signed)
 Mobility Specialist - Progress Note   03/13/24 1444  Mobility  Activity Stood at bedside  Level of Assistance Contact guard assist, steadying assist  Assistive Device None (HHA)  RUE Weight Bearing Per Provider Order NWB  Activity Response Tolerated well  Mobility visit 1 Mobility  Mobility Specialist Start Time (ACUTE ONLY) 1431  Mobility Specialist Stop Time (ACUTE ONLY) 1443  Mobility Specialist Time Calculation (min) (ACUTE ONLY) 12 min   Pt supine upon entry, utilizing RA. Pt agreeable to stand EOB this date. Pt completed bed mob with MinA to bring BLE EOB, STS from EOB x2 CGA-HHA--- denies fatigue. Pt returned EOB, left supine with alarm set and needs within reach.  America Silvan Mobility Specialist 03/13/24 2:47 PM

## 2024-03-13 NOTE — TOC Progression Note (Signed)
 Transition of Care [facility]) - Progression Note    Patient Details  Name: Roberta Ross MRN: [id number] Date of Birth: 04/20/1934  Transition of Care [facility]) CM/SW Contact  Alvaro Louder, [place] Phone Number: 03/13/2024, 4:02 PM  Clinical Narrative:   Patient and family are deciding between SNF [facility].   TOC to follow for choice.     Expected Discharge Plan: Skilled Nursing Facility Barriers to Discharge: Continued Medical Work up, English As A Second Language Teacher               Expected Discharge Plan and Services     Post Acute Care Choice: Skilled Nursing Facility Living arrangements for the past 2 months: Assisted Living Facility                                       Social Drivers of Health (SDOH) Interventions SDOH Screenings   Food Insecurity: No Food Insecurity (03/13/2024)  Housing: Low Risk  (03/13/2024)  Transportation Needs: No Transportation Needs (03/13/2024)  Utilities: Not At Risk (03/13/2024)  Social Connections: Unknown (03/13/2024)  Recent Concern: Social Connections - Socially Isolated (03/13/2024)  Tobacco Use: Medium Risk (03/12/2024)    Readmission Risk Interventions     No data to display

## 2024-03-13 NOTE — NC FL2 (Signed)
 [place]  MEDICAID FL2 LEVEL OF CARE FORM     IDENTIFICATION  Patient Name: Roberta Ross Birthdate: 1933/07/27 Sex: female Admission Date (Current Location): 03/12/2024  [facility] and [place] Number:  Chiropodist and Address:  [facility], [address], [place], [place] [address]      Provider Number: [id number]  Attending Physician Name and Address:  Laurita Pillion, MD  Relative Name and Phone Number:       Current Level of Care: Hospital Recommended Level of Care: Skilled Nursing Facility Prior Approval Number:    Date Approved/Denied:   PASRR Number: [id number]  Discharge Plan: SNF    Current Diagnoses: Patient Active Problem List   Diagnosis Date Noted   Anterior dislocation of right shoulder, initial encounter 03/12/2024   Dyslipidemia 03/12/2024   Gout 03/12/2024   Paroxysmal atrial fibrillation (HCC) 03/12/2024   Chronic obstructive pulmonary disease (COPD) (HCC) 03/12/2024   Depression 03/12/2024   Acute metabolic encephalopathy 02/10/2024   Acute gout 02/09/2024   Acute kidney injury superimposed on CKD 02/08/2024   Pressure injury of skin 02/08/2024   Decubitus ulcer of right buttock, stage 2 (HCC) 02/08/2024   Acute DVT (deep venous thrombosis) (HCC) 02/07/2024   DVT (deep venous thrombosis) (HCC) 01/26/2024   Abdominal pain 02/26/2023   Mycoplasma pneumoniae pneumonia 02/22/2023   COVID-19 virus infection 02/15/2023   COVID 02/15/2023   Rheumatoid arthritis (HCC) 01/24/2022   CAP (community acquired pneumonia) 01/21/2022   Sepsis (HCC) secondary to community-acquired pneumonia and UTI 01/21/2022   Acute on chronic systolic CHF (congestive heart failure) (HCC) 01/21/2022   UTI (urinary tract infection) 01/21/2022   HLD (hyperlipidemia) 01/21/2022   Chronic kidney disease, stage 3b (HCC) 01/21/2022   Sepsis due to pneumonia [facility])    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   Closed fracture of  femur, intertrochanteric, left, initial encounter (HCC) 03/27/2020   Accidental fall 03/27/2020   Preoperative clearance 03/27/2020   Closed left hip fracture, initial encounter (HCC) 03/27/2020   Simple chronic bronchitis (HCC) 09/01/2018   Atrial fibrillation, chronic (HCC) 09/16/2017   History of subarachnoid hemorrhage 07/21/2017   Prediabetes 03/21/2017   GAD (generalized anxiety disorder) 02/01/2016   Hypercalcemia 02/01/2016   Essential hypertension 01/04/2015   Stage 3b chronic kidney disease (HCC) 08/24/2014   Pure hypercholesterolemia 02/09/2014   H/O rheumatoid arthritis 10/20/2013    Orientation RESPIRATION BLADDER Height & Weight     Self, Time, Situation, Place  O2 (2 L) Continent Weight: 145 lb 8.1 oz (66 kg) Height:  5' 5 (165.1 cm)  BEHAVIORAL SYMPTOMS/MOOD NEUROLOGICAL BOWEL NUTRITION STATUS      Continent Diet (Heart)  AMBULATORY STATUS COMMUNICATION OF NEEDS Skin   Extensive Assist Verbally Surgical wounds (Surgical closed incsison right arm, Pretibial Left)                       Personal Care Assistance Level of Assistance  Bathing, Feeding, Dressing Bathing Assistance: Limited assistance Feeding assistance: Independent Dressing Assistance: Limited assistance     Functional Limitations Info  Sight, Hearing, Speech Sight Info: Adequate Hearing Info: Adequate Speech Info: Adequate    SPECIAL CARE FACTORS FREQUENCY  PT (By licensed PT), OT (By licensed OT)     PT Frequency: 5x/week OT Frequency: 5x/week            Contractures      Additional Factors Info  Code Status, Allergies Code Status Info: DNR Limited Allergies Info: Metoprolol, Penicillins, Methotrexate  And Trimetrexate, Doxycycline Hyclate           Current Medications (03/13/2024):  This is the current hospital active medication list Current Facility-Administered Medications  Medication Dose Route Frequency Provider Last Rate Last Admin   0.9 %  sodium chloride   infusion   Intravenous Continuous Poggi, John J, MD 75 mL/hr at 03/13/24 0804 Infusion Verify at 03/13/24 0804   acetaminophen  (TYLENOL ) tablet 325-650 mg  325-650 mg Oral Q6H PRN Poggi, John J, MD       albuterol  (PROVENTIL ) (2.5 MG/3ML) 0.083% nebulizer solution 2.5 mg  2.5 mg Nebulization Q4H PRN Dail Rankin RAMAN, RPH       allopurinol (ZYLOPRIM) tablet 50 mg  50 mg Oral Daily Poggi, John J, MD   50 mg at 03/13/24 9076   bisacodyl  (DULCOLAX) suppository 10 mg  10 mg Rectal Daily PRN Poggi, John J, MD       diltiazem  (CARDIZEM  LA) 24 hr tablet 240 mg  240 mg Oral Daily Poggi, John J, MD   240 mg at 03/13/24 9075   diphenhydrAMINE (BENADRYL) 12.5 MG/5ML elixir 12.5-25 mg  12.5-25 mg Oral Q4H PRN Poggi, John J, MD       docusate sodium  (COLACE) capsule 100 mg  100 mg Oral BID Poggi, John J, MD   100 mg at 03/13/24 9075   magnesium  hydroxide (MILK OF MAGNESIA) suspension 30 mL  30 mL Oral Daily PRN Poggi, Norleen PARAS, MD       metoCLOPramide  (REGLAN ) tablet 5-10 mg  5-10 mg Oral Q8H PRN Poggi, John J, MD       Or   metoCLOPramide  (REGLAN ) injection 5-10 mg  5-10 mg Intravenous Q8H PRN Poggi, John J, MD       ondansetron  (ZOFRAN ) tablet 4 mg  4 mg Oral Q6H PRN Poggi, John J, MD       Or   ondansetron  (ZOFRAN ) injection 4 mg  4 mg Intravenous Q6H PRN Poggi, John J, MD       oxyCODONE  (Oxy IR/ROXICODONE ) immediate release tablet 2.5-5 mg  2.5-5 mg Oral Q6H PRN Poggi, John J, MD       predniSONE (DELTASONE) tablet 5 mg  5 mg Oral Daily Poggi, Norleen PARAS, MD   5 mg at 03/13/24 0924   sertraline (ZOLOFT) tablet 50 mg  50 mg Oral Daily Poggi, John J, MD   50 mg at 03/13/24 9075   simvastatin  (ZOCOR ) tablet 10 mg  10 mg Oral QPM Poggi, Norleen PARAS, MD   10 mg at 03/12/24 2321   sodium phosphate  (FLEET) enema 1 enema  1 enema Rectal Once PRN Poggi, John J, MD       traZODone (DESYREL) tablet 25 mg  25 mg Oral QHS PRN Poggi, John J, MD       warfarin (COUMADIN) tablet 3 mg  3 mg Oral ONCE-1600 Elesa Perkins, [facility]        Warfarin - Pharmacist Dosing Inpatient   Does not apply q1600 Elesa Perkins, RPH       zinc oxide 20 % ointment 1 Application  1 Application Topical TID Poggi, Norleen PARAS, MD   1 Application at 03/13/24 9073     Discharge Medications: Please see discharge summary for a list of discharge medications.  Relevant Imaging Results:  Relevant Lab Results:   Additional Information [id number]  Bryston Colocho  [place], LCSW

## 2024-03-14 DIAGNOSIS — S43014A Anterior dislocation of right humerus, initial encounter: Secondary | ICD-10-CM | POA: Diagnosis not present

## 2024-03-14 DIAGNOSIS — I480 Paroxysmal atrial fibrillation: Secondary | ICD-10-CM | POA: Diagnosis not present

## 2024-03-14 DIAGNOSIS — J449 Chronic obstructive pulmonary disease, unspecified: Secondary | ICD-10-CM | POA: Diagnosis not present

## 2024-03-14 LAB — CBC
HCT: 34.3 % — ABNORMAL LOW (ref 36.0–46.0)
Hemoglobin: 10.5 g/dL — ABNORMAL LOW (ref 12.0–15.0)
MCH: 31.1 pg (ref 26.0–34.0)
MCHC: 30.6 g/dL (ref 30.0–36.0)
MCV: 101.5 fL — ABNORMAL HIGH (ref 80.0–100.0)
Platelets: 173 K/uL (ref 150–400)
RBC: 3.38 MIL/uL — ABNORMAL LOW (ref 3.87–5.11)
RDW: 14 % (ref 11.5–15.5)
WBC: 6.5 K/uL (ref 4.0–10.5)
nRBC: 0 % (ref 0.0–0.2)

## 2024-03-14 LAB — PROTIME-INR
INR: 2.8 — ABNORMAL HIGH (ref 0.8–1.2)
Prothrombin Time: 30.8 s — ABNORMAL HIGH (ref 11.4–15.2)

## 2024-03-14 MED ORDER — WARFARIN SODIUM 3 MG PO TABS
3.0000 mg | ORAL_TABLET | Freq: Once | ORAL | Status: AC
  Administered 2024-03-14: 3 mg via ORAL
  Filled 2024-03-14: qty 1

## 2024-03-14 NOTE — Plan of Care (Signed)

## 2024-03-14 NOTE — Progress Notes (Signed)
  Progress Note   Patient: Roberta Ross FMW:[id number] DOB: [birth year] DOA: 03/12/2024     0 DOS: the patient was seen and examined on 03/14/2024   Brief hospital course: Gayathri Futrell is a 89 y.o. Caucasian female with medical history significant for COPD, dyslipidemia, atrial fibrillation on Coumadin hypertension, rheumatic fever and osteoarthritis, who presented to the emergency room with acute onset of right shoulder pain.  Patient had a fall on Friday with subsequent right shoulder dislocation for which she was seen in the ER.  It was then reduced and she was given a sling and an appointment to see [facility] tomorrow.  Patient apparently took off her sling as it was bothering her and forgot and used her right arm with subsequent recurrent dislocation. Patient had closed reduction of dislocation of left shoulder in the OR on 11/13. She was seen by PT/OT, deemed not able to go back to memory unit, need nursing placement.   Principal Problem:   Anterior dislocation of right shoulder, initial encounter Active Problems:   Dyslipidemia   Paroxysmal atrial fibrillation (HCC)   Chronic obstructive pulmonary disease (COPD) (HCC)   CKD stage 3b, GFR 30-44 ml/min (HCC)   Gout   Depression   Assessment and Plan: * Anterior dislocation of right shoulder, initial encounter Status post closed reduction. Seen by PT/OT, patient could not ambulate safely to go back to memory unit.  Recommended nursing home placement.  Currently pending placement, no significant pain.   Chronic Kidney disease disease stage IIIb. Renal function still stable.   Dyslipidemia Continue home medicines.   Paroxysmal atrial fibrillation (HCC) Continue Cardizem , pharmacy to dose warfarin. INR in therapeutic range.   Chronic obstructive pulmonary disease (COPD) (HCC) No exacerbation.   Depression Continue Zoloft   Gout Allopurinol, no flareup.      Subjective:  Patient doing well  today, no significant pain.  Good appetite.  Physical Exam: Vitals:   03/13/24 2121 03/14/24 0121 03/14/24 0521 03/14/24 0750  BP: 101/70 98/68 106/75 90/61  Pulse:  (!) 102 80 87  Resp: 18 17 17 16   Temp: 98.1 F (36.7 C) 99 F (37.2 C) 98.8 F (37.1 C) 97.7 F (36.5 C)  TempSrc:  Oral Oral   SpO2: 95% 97% 93% 96%  Weight:      Height:       General exam: Appears calm and comfortable  Respiratory system: Clear to auscultation. Respiratory effort normal. Cardiovascular system: S1 & S2 heard, RRR. No JVD, murmurs, rubs, gallops or clicks. No pedal edema. Gastrointestinal system: Abdomen is nondistended, soft and nontender. No organomegaly or masses felt. Normal bowel sounds heard. Central nervous system: Alert and oriented x2. No focal neurological deficits. Extremities: Symmetric 5 x 5 power. Skin: No rashes, lesions or ulcers Psychiatry: Judgement and insight appear normal. Mood & affect appropriate.    Data Reviewed:  Lab results reviewed.  Family Communication: None  Disposition: Status is: Observation      Time spent: 35 minutes  Author: Murvin Mana, MD 03/14/2024 10:54 AM  For on call review [email or web address]

## 2024-03-14 NOTE — Progress Notes (Signed)
 PHARMACY - ANTICOAGULATION CONSULT NOTE  Pharmacy Consult for warfarin Indication: atrial fibrillation and DVT  Allergies  Allergen Reactions   Metoprolol Shortness Of Breath   Penicillins Other (See Comments)    Internal swelling including throat TOLERATES CEFTRIAXONE    Methotrexate And Trimetrexate Nausea Only   Doxycycline Hyclate Rash    Patient Measurements: Height: 5' 5 (165.1 cm) Weight: 66 kg (145 lb 8.1 oz) IBW/kg (Calculated) : 57 HEPARIN  DW (KG): 66  Vital Signs: Temp: 97.7 F (36.5 C) (11/15 0750) Temp Source: Oral (11/15 0521) BP: 90/61 (11/15 0750) Pulse Rate: 87 (11/15 0750)  Labs: Recent Labs    03/12/24 2252 03/13/24 0313 03/13/24 1010 03/14/24 0631  HGB 11.1* 11.5*  --  10.5*  HCT 35.9* 36.8  --  34.3*  PLT 189 196  --  173  LABPROT 28.9*  --  30.6* 30.8*  INR 2.6*  --  2.8* 2.8*  CREATININE 1.29* 1.32*  --   --     Estimated Creatinine Clearance: 27 mL/min (A) (by C-G formula based on SCr of 1.32 mg/dL (H)).   Medical History: Past Medical History:  Diagnosis Date   Anemia    past hx   Arthritis    hands and feet   Chronic kidney disease    COPD (chronic obstructive pulmonary disease) (HCC)    Dyspnea    Dysrhythmia    Pt reports slow HR and papatations   Heart murmur    past hx   Hypercholesteremia    Hypertension    Rheumatic fever    (x2) in past   Wears dentures    full upper   Assessment: 89 y/o female presenting with right shoulder pain. 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 Pharmacy consulted to continue home warfarin and manage while inpatient.   Home warfarin regimen:  3 mg daily (total weekly dose of 21 mg)  Last warfarin dose: 3 mg on 03/12/24 in evening Baseline labs: hgb 11.5, plt 196 DDI: on simvastatin , sertraline and diltiazem  PTA (can increase bleeding risk)  Date INR Warfarin Dose 11/13 2.6 3 mg 11/14 2.8 3 mg 11/15 2.8 3 mg  Goal of Therapy:   INR 2-3 Monitor platelets by anticoagulation protocol: Yes   Plan:  Give warfarin 3 mg x1 today (home dose) Recheck INR in AM and daily while inpatient Monitor CBC and signs/symptoms of bleeding  Thank you for involving pharmacy in this patient's care.   Destiny Trickey A Grantley Savage, PharmD Clinical Pharmacist 03/14/2024 8:57 AM

## 2024-03-15 DIAGNOSIS — I480 Paroxysmal atrial fibrillation: Secondary | ICD-10-CM | POA: Diagnosis not present

## 2024-03-15 DIAGNOSIS — S43014A Anterior dislocation of right humerus, initial encounter: Secondary | ICD-10-CM | POA: Diagnosis not present

## 2024-03-15 DIAGNOSIS — J449 Chronic obstructive pulmonary disease, unspecified: Secondary | ICD-10-CM | POA: Diagnosis not present

## 2024-03-15 LAB — PROTIME-INR
INR: 2.3 — ABNORMAL HIGH (ref 0.8–1.2)
Prothrombin Time: 26 s — ABNORMAL HIGH (ref 11.4–15.2)

## 2024-03-15 MED ORDER — LACTULOSE 10 GM/15ML PO SOLN
20.0000 g | Freq: Once | ORAL | Status: AC
  Administered 2024-03-15: 20 g via ORAL
  Filled 2024-03-15: qty 30

## 2024-03-15 MED ORDER — WARFARIN SODIUM 3 MG PO TABS
3.0000 mg | ORAL_TABLET | Freq: Once | ORAL | Status: AC
  Administered 2024-03-15: 3 mg via ORAL
  Filled 2024-03-15: qty 1

## 2024-03-15 NOTE — Progress Notes (Signed)
  Progress Note   Patient: Roberta Ross FMW:[id number] DOB: 11-29-1933 DOA: 03/12/2024     0 DOS: the patient was seen and examined on 03/15/2024   Brief hospital course: Roberta Ross is a 89 y.o. Caucasian female with medical history significant for COPD, dyslipidemia, atrial fibrillation on Coumadin hypertension, rheumatic fever and osteoarthritis, who presented to the emergency room with acute onset of right shoulder pain.  Patient had a fall on Friday with subsequent right shoulder dislocation for which she was seen in the ER.  It was then reduced and she was given a sling and an appointment to see [facility] tomorrow.  Patient apparently took off her sling as it was bothering her and forgot and used her right arm with subsequent recurrent dislocation. Patient had closed reduction of dislocation of left shoulder in the OR on 11/13. She was seen by PT/OT, deemed not able to go back to memory unit, need nursing placement.   Principal Problem:   Anterior dislocation of right shoulder, initial encounter Active Problems:   Dyslipidemia   Paroxysmal atrial fibrillation (HCC)   Chronic obstructive pulmonary disease (COPD) (HCC)   CKD stage 3b, GFR 30-44 ml/min (HCC)   Gout   Depression   Assessment and Plan: * Anterior dislocation of right shoulder, initial encounter Status post closed reduction. Seen by PT/OT, patient could not ambulate safely to go back to memory unit.  Recommended nursing home placement.  Patient doing well.  No change in treatment plan.   Chronic Kidney disease disease stage IIIb. Renal function still stable.   Dyslipidemia Continue home medicines.   Paroxysmal atrial fibrillation (HCC) Continue Cardizem , pharmacy to dose warfarin. INR in therapeutic range.   Chronic obstructive pulmonary disease (COPD) (HCC) No exacerbation.   Depression Continue Zoloft   Gout Allopurinol, no flareup.   Constipation. Patient has not had a  bowel movement since admission, given lactulose.     Subjective:  Feel constipated, otherwise no complaint.  Physical Exam: Vitals:   03/14/24 1639 03/14/24 1948 03/15/24 0512 03/15/24 0841  BP: (!) 89/59 95/60 96/62  100/61  Pulse: 71 70 72 66  Resp: 16 18 18 17   Temp: 97.8 F (36.6 C) 98.2 F (36.8 C) 98.6 F (37 C) 97.8 F (36.6 C)  TempSrc:   Axillary Oral  SpO2: 97% 98% 97% 93%  Weight:      Height:       General exam: Appears calm and comfortable  Respiratory system: Clear to auscultation. Respiratory effort normal. Cardiovascular system: S1 & S2 heard, RRR. No JVD, murmurs, rubs, gallops or clicks. No pedal edema. Gastrointestinal system: Abdomen is nondistended, soft and nontender. No organomegaly or masses felt. Normal bowel sounds heard. Central nervous system: Alert and oriented. No focal neurological deficits. Extremities: Symmetric 5 x 5 power. Skin: No rashes, lesions or ulcers Psychiatry: Judgement and insight appear normal. Mood & affect appropriate.    Data Reviewed:  There are no new results to review at this time.  Family Communication: None  Disposition: Status is: Observation      Time spent: 35 minutes  Author: Murvin Mana, MD 03/15/2024 2:05 PM  For on call review [email or web address].  \

## 2024-03-15 NOTE — Plan of Care (Signed)
   Problem: Education: Goal: Knowledge of General Education information will improve Description Including pain rating scale, medication(s)/side effects and non-pharmacologic comfort measures Outcome: Progressing   Problem: Health Behavior/Discharge Planning: Goal: Ability to manage health-related needs will improve Outcome: Progressing

## 2024-03-15 NOTE — Care Plan (Signed)
 Unable to assess RUE, skin, incision nor pulses, patient is adamant that we can not touch her arm per MD instructions. Attempted to educate patient, unsuccessfully.

## 2024-03-15 NOTE — Care Management Obs Status (Signed)
 MEDICARE OBSERVATION STATUS NOTIFICATION   Patient Details  Name: Roberta Ross MRN: [id number] Date of Birth: 09/03/1933   Medicare Observation Status Notification Given:  Yes    [name] 03/15/2024, 4:46 PM

## 2024-03-15 NOTE — TOC Progression Note (Signed)
 Transition of Care [facility]) - Progression Note    Patient Details  Name: Idella Lamontagne MRN: [id number] Date of Birth: 12/21/1933  Transition of [facility] CM/SW Contact  Alvia Olam Fabry, RN Phone Number: 03/15/2024, 4:07 PM  Clinical Narrative:    [facility] received authorization for [facility] # [id number] 11/14-11/18/2025.   TOC will continue to follow up accordingly   Expected Discharge Plan: Skilled Nursing Facility Barriers to Discharge: Continued Medical Work up, English As A Second Language Teacher               Expected Discharge Plan and Services     Post Acute Care Choice: Skilled Nursing Facility Living arrangements for the past 2 months: Assisted Living Facility                                       Social Drivers of Health (SDOH) Interventions SDOH Screenings   Food Insecurity: No Food Insecurity (03/13/2024)  Housing: Low Risk  (03/13/2024)  Transportation Needs: No Transportation Needs (03/13/2024)  Utilities: Not At Risk (03/13/2024)  Social Connections: Unknown (03/13/2024)  Recent Concern: Social Connections - Socially Isolated (03/13/2024)  Tobacco Use: Medium Risk (03/12/2024)    Readmission Risk Interventions     No data to display

## 2024-03-15 NOTE — Progress Notes (Signed)
 PHARMACY - ANTICOAGULATION CONSULT NOTE  Pharmacy Consult for warfarin Indication: atrial fibrillation and DVT  Allergies  Allergen Reactions   Metoprolol Shortness Of Breath   Penicillins Other (See Comments)    Internal swelling including throat TOLERATES CEFTRIAXONE    Methotrexate And Trimetrexate Nausea Only   Doxycycline Hyclate Rash    Patient Measurements: Height: 5' 5 (165.1 cm) Weight: 66 kg (145 lb 8.1 oz) IBW/kg (Calculated) : 57 HEPARIN  DW (KG): 66  Vital Signs: Temp: 97.8 F (36.6 C) (11/16 0841) Temp Source: Oral (11/16 0841) BP: 100/61 (11/16 0841) Pulse Rate: 66 (11/16 0841)  Labs: Recent Labs    03/12/24 2252 03/13/24 0313 03/13/24 1010 03/14/24 0631 03/15/24 0510  HGB 11.1* 11.5*  --  10.5*  --   HCT 35.9* 36.8  --  34.3*  --   PLT 189 196  --  173  --   LABPROT 28.9*  --  30.6* 30.8* 26.0*  INR 2.6*  --  2.8* 2.8* 2.3*  CREATININE 1.29* 1.32*  --   --   --     Estimated Creatinine Clearance: 27 mL/min (A) (by C-G formula based on SCr of 1.32 mg/dL (H)).   Medical History: Past Medical History:  Diagnosis Date   Anemia    past hx   Arthritis    hands and feet   Chronic kidney disease    COPD (chronic obstructive pulmonary disease) (HCC)    Dyspnea    Dysrhythmia    Pt reports slow HR and papatations   Heart murmur    past hx   Hypercholesteremia    Hypertension    Rheumatic fever    (x2) in past   Wears dentures    full upper   Assessment: 89 y/o female presenting with right shoulder pain. 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 Pharmacy consulted to continue home warfarin and manage while inpatient.   Home warfarin regimen:  3 mg daily (total weekly dose of 21 mg)  Last warfarin dose: 3 mg on 03/12/24 in evening Baseline labs: hgb 11.5, plt 196 DDI: on simvastatin , sertraline and diltiazem  PTA (can increase bleeding risk)  Date INR Warfarin Dose 11/13 2.6 3  mg 11/14 2.8 3 mg 11/15 2.8 3 mg 11/16 2.3 3 mg  Goal of Therapy:  INR 2-3 Monitor platelets by anticoagulation protocol: Yes   Plan:  Give warfarin 3 mg x1 today (home dose) Recheck INR in AM and daily while inpatient Monitor CBC and signs/symptoms of bleeding  Thank you for involving pharmacy in this patient's care.   Kourtney Terriquez A Carolann Brazell, PharmD Clinical Pharmacist 03/15/2024 9:20 AM

## 2024-03-16 DIAGNOSIS — S43014A Anterior dislocation of right humerus, initial encounter: Secondary | ICD-10-CM | POA: Diagnosis not present

## 2024-03-16 DIAGNOSIS — I480 Paroxysmal atrial fibrillation: Secondary | ICD-10-CM | POA: Diagnosis not present

## 2024-03-16 DIAGNOSIS — N1832 Chronic kidney disease, stage 3b: Secondary | ICD-10-CM

## 2024-03-16 LAB — PROTIME-INR
INR: 2.2 — ABNORMAL HIGH (ref 0.8–1.2)
Prothrombin Time: 25.8 s — ABNORMAL HIGH (ref 11.4–15.2)

## 2024-03-16 MED ORDER — OXYCODONE HCL 5 MG PO TABS: 2.5000 mg | ORAL_TABLET | Freq: Four times a day (QID) | ORAL | 0 refills | Status: DC | PRN

## 2024-03-16 MED ORDER — WARFARIN SODIUM 3 MG PO TABS
3.0000 mg | ORAL_TABLET | Freq: Once | ORAL | Status: DC
  Filled 2024-03-16: qty 1

## 2024-03-16 NOTE — Progress Notes (Signed)
 PHARMACY - ANTICOAGULATION CONSULT NOTE  Pharmacy Consult for warfarin Indication: atrial fibrillation and DVT  Allergies  Allergen Reactions   Metoprolol Shortness Of Breath   Penicillins Other (See Comments)    Internal swelling including throat TOLERATES CEFTRIAXONE    Methotrexate And Trimetrexate Nausea Only   Doxycycline Hyclate Rash    Patient Measurements: Height: 5' 5 (165.1 cm) Weight: 66 kg (145 lb 8.1 oz) IBW/kg (Calculated) : 57 HEPARIN  DW (KG): 66  Vital Signs: Temp: 97.9 F (36.6 C) (11/17 0348) BP: 119/75 (11/17 0348) Pulse Rate: 67 (11/17 0348)  Labs: Recent Labs    03/14/24 0631 03/15/24 0510 03/16/24 0346  HGB 10.5*  --   --   HCT 34.3*  --   --   PLT 173  --   --   LABPROT 30.8* 26.0* 25.8*  INR 2.8* 2.3* 2.2*    Estimated Creatinine Clearance: 27 mL/min (A) (by C-G formula based on SCr of 1.32 mg/dL (H)).   Medical History: Past Medical History:  Diagnosis Date   Anemia    past hx   Arthritis    hands and feet   Chronic kidney disease    COPD (chronic obstructive pulmonary disease) (HCC)    Dyspnea    Dysrhythmia    Pt reports slow HR and papatations   Heart murmur    past hx   Hypercholesteremia    Hypertension    Rheumatic fever    (x2) in past   Wears dentures    full upper   Assessment: 89 y/o female presenting with right shoulder pain. 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 Pharmacy consulted to continue home warfarin and manage while inpatient.   Home warfarin regimen:  3 mg daily (total weekly dose of 21 mg)  Last warfarin dose: 3 mg on 03/12/24 in evening Baseline labs: hgb 11.5, plt 196 DDI: on simvastatin , sertraline and diltiazem  PTA (can increase bleeding risk)  Date INR Warfarin Dose 11/13 2.6 3 mg 11/14 2.8 3 mg 11/15 2.8 3 mg 11/16 2.3 3 mg 11/17 2.2 3 mg  Goal of Therapy:  INR 2-3 Monitor platelets by anticoagulation protocol: Yes   Plan:   Give warfarin 3 mg x1 today (home dose) Recheck INR in AM and daily while inpatient Monitor CBC and signs/symptoms of bleeding  Thank you for involving pharmacy in this patient's care.   Damien Napoleon, PharmD Clinical Pharmacist 03/16/2024 7:20 AM

## 2024-03-16 NOTE — Discharge Summary (Signed)
 Physician Discharge Summary   Patient: Roberta Ross MRN: [id number] DOB: [birth year]  Admit date:     03/12/2024  Discharge date: 03/16/24  Discharge Physician: Murvin Mana   PCP: Lenon Layman ORN, MD   Recommendations at discharge:   Follow-up with PCP in 1 week. Follow-up with Dr. Edie in 2 weeks. Warfarin protocol.  Discharge Diagnoses: Principal Problem:   Anterior dislocation of right shoulder, initial encounter Active Problems:   Dyslipidemia   Paroxysmal atrial fibrillation (HCC)   Chronic obstructive pulmonary disease (COPD) (HCC)   CKD stage 3b, GFR 30-44 ml/min (HCC)   Gout   Depression  Resolved Problems:   * No resolved hospital problems. *  Hospital Course: Roberta Ross is a 89 y.o. Caucasian female with medical history significant for COPD, dyslipidemia, atrial fibrillation on Coumadin hypertension, rheumatic fever and osteoarthritis, who presented to the emergency room with acute onset of right shoulder pain.  Patient had a fall on Friday with subsequent right shoulder dislocation for which she was seen in the ER.  It was then reduced and she was given a sling and an appointment to see [facility] tomorrow.  Patient apparently took off her sling as it was bothering her and forgot and used her right arm with subsequent recurrent dislocation. Patient had closed reduction of dislocation of left shoulder in the OR on 11/13. She was seen by PT/OT, deemed not able to go back to memory unit, need nursing placement.  Assessment and Plan:   Anterior dislocation of right shoulder, initial encounter Status post closed reduction. Seen by PT/OT, patient could not ambulate safely to go back to memory unit.  Recommended nursing home placement.  Continue as needed pain medicine, follow-up with Dr. Edie in 2 weeks.   Chronic Kidney disease disease stage IIIb. Renal function still stable.   Dyslipidemia Continue home medicines.   Paroxysmal  atrial fibrillation (HCC) Continue Cardizem , pharmacy to dose warfarin. INR in therapeutic range.   Chronic obstructive pulmonary disease (COPD) (HCC) No exacerbation.   Depression Continue Zoloft   Gout Allopurinol, no flareup.   Constipation. Patient has not had a bowel movement since admission, given lactulose.       Consultants: Orthopedics Procedures performed: Closed reduction. Disposition: Skilled nursing facility Diet recommendation:  Discharge Diet Orders (From admission, onward)     Start     Ordered   03/16/24 0000  Diet - low sodium heart healthy        03/16/24 0913           Cardiac diet DISCHARGE MEDICATION: Allergies as of 03/16/2024       Reactions   Metoprolol Shortness Of Breath   Penicillins Other (See Comments)   Internal swelling including throat TOLERATES CEFTRIAXONE    Methotrexate And Trimetrexate Nausea Only   Doxycycline Hyclate Rash        Medication List     STOP taking these medications    Spiriva HandiHaler 18 MCG Caps Generic drug: Tiotropium Bromide       TAKE these medications    acetaminophen  650 MG CR tablet Commonly known as: TYLENOL  Take 650 mg by mouth 2 (two) times daily.   albuterol  108 (90 Base) MCG/ACT inhaler Commonly known as: VENTOLIN  HFA Inhale 2 puffs into the lungs every 4 (four) hours as needed for wheezing or shortness of breath.   allopurinol 100 MG tablet Commonly known as: ZYLOPRIM Take 0.5 tablets (50 mg total) by mouth daily.   ammonium lactate 12 % lotion Commonly known  as: LAC-HYDRIN Apply 1 Application topically daily.   diclofenac Sodium 1 % Gel Commonly known as: VOLTAREN Apply 4 g topically 4 (four) times daily. Both knees   diltiazem  240 MG 24 hr tablet Commonly known as: CARDIZEM  LA Take 240 mg by mouth daily.   folic acid 400 MCG tablet Commonly known as: FOLVITE Take 400 mcg by mouth in the morning.   melatonin 5 MG Tabs Take 1 tablet (5 mg total) by mouth at  bedtime.   oxyCODONE  5 MG immediate release tablet Commonly known as: Oxy IR/ROXICODONE  Take 0.5-1 tablets (2.5-5 mg total) by mouth every 6 (six) hours as needed for moderate pain (pain score 4-6) or severe pain (pain score 7-10). What changed: how much to take   polyethylene glycol 17 g packet Commonly known as: MIRALAX  / GLYCOLAX  Take 17 g by mouth daily.   predniSONE 5 MG tablet Commonly known as: DELTASONE Take 5 mg by mouth daily.   sennosides-docusate sodium  8.6-50 MG tablet Commonly known as: SENOKOT-S Take 2 tablets by mouth 2 (two) times daily.   sertraline 50 MG tablet Commonly known as: ZOLOFT Take 50 mg by mouth daily.   simvastatin  10 MG tablet Commonly known as: Zocor  Take 1 tablet (10 mg total) by mouth every evening.   torsemide  20 MG tablet Commonly known as: DEMADEX  Take 1 tablet (20 mg total) by mouth every other day.   Vitamin D (Ergocalciferol) 1.25 MG (50000 UNIT) Caps capsule Commonly known as: DRISDOL Take 50,000 Units by mouth once a week.   warfarin 2 MG tablet Commonly known as: COUMADIN Take 2 tablets (4 mg total) by mouth daily. INR check 02/14/24 and further dose adjustments based on that level. Goal 2-3 What changed:  how much to take when to take this additional instructions   Zinc Oxide 20 % Pste Apply 1 application  topically 3 (three) times daily. Bottom/sacrum               Discharge Care Instructions  (From admission, onward)           Start     Ordered   03/16/24 0000  Discharge wound care:       Comments: Follow with RN   03/16/24 0913            Contact information for follow-up providers     Lenon Layman ORN, MD Follow up in 1 week(s).   Specialty: Internal Medicine Contact information: [address] [facility] [place] [address] [phone]         Edie Norleen PARAS, MD Follow up in 2 week(s).   Specialty: Orthopedic Surgery Contact information: [address] [facility] [place] [address] [phone]              Contact information for after-discharge care     Destination     [facility] .   Service: Skilled Nursing Contact information: [address] [place]  [address] [phone]                    Discharge Exam: Roberta Ross   03/12/24 1542  Weight: 66 kg   General exam: Appears calm and comfortable  Respiratory system: Clear to auscultation. Respiratory effort normal. Cardiovascular system: S1 & S2 heard, RRR. No JVD, murmurs, rubs, gallops or clicks. No pedal edema. Gastrointestinal system: Abdomen is nondistended, soft and nontender. No organomegaly or masses felt. Normal bowel sounds heard.  Central nervous system: Alert and oriented. No focal neurological deficits. Extremities: Symmetric 5 x 5 power. Skin: No rashes, lesions or ulcers Psychiatry: Judgement and insight appear normal. Mood & affect appropriate.    Condition at discharge: good  The results of significant diagnostics from this hospitalization (including imaging, microbiology, ancillary and laboratory) are listed below for reference.   Imaging Studies: DG Shoulder Right Result Date: 03/12/2024 EXAM: 2 SPOT VIEW(S) XRAY OF THE RIGHT SHOULDER 03/12/2024 09:32:25 PM COMPARISON: Plain film from earlier in the same day. CLINICAL HISTORY: Shoulder dislocation. FINDINGS: BONES AND JOINTS: Two spot films obtained intraoperatively reveal relocation of the humeral head into the glenoid. No acute fracture. Fluoro dose was 0.7 mgy. Fluoro time was 5 seconds. IMPRESSION: 1. Relocation of the right humeral head into the glenoid following reduction procedure. Electronically signed by: Oneil Devonshire MD 03/12/2024 09:37 PM EST RP Workstation: [id number] [id number] 1-60 Min-No Report Result Date: 03/12/2024 Fluoroscopy was utilized by the requesting physician.  No radiographic interpretation.    DG Shoulder Right Result Date: 03/12/2024 EXAM: 1 VIEW XRAY OF THE RIGHT SHOULDER 03/12/2024 04:17:00 PM COMPARISON: Comparison 5 days ago. CLINICAL HISTORY: possible injury FINDINGS: BONES AND JOINTS: Anterior dislocation of the right glenohumeral joint is noted. No definite fracture is noted. [employer] joint is unremarkable in appearance. SOFT TISSUES: No abnormal calcifications. Visualized lung is unremarkable. IMPRESSION: 1. Anterior dislocation of the right glenohumeral joint. 2. No definite fracture identified. Electronically signed by: Lynwood Seip MD 03/12/2024 04:21 PM EST RP Workstation: [id number]   DG Shoulder Right Result Date: 03/07/2024 EXAM: 3 VIEW(S) XRAY OF THE RIGHT SHOULDER 03/07/2024 10:53:00 PM COMPARISON: Right shoulder radiographs earlier today. CLINICAL HISTORY: dislocation, post reduction FINDINGS: BONES AND JOINTS: Glenohumeral joint is normally aligned following interval reduction of the humeral head. No acute fracture or dislocation. Mild degenerative changes of the acromioclavicular joint. Decreased subacromial distance, suggesting chronic rotator cuff tear. SOFT TISSUES: No abnormal calcifications. Visualized lung is unremarkable. IMPRESSION: 1. Interval reduction of the humeral head. 2. No fracture. Electronically signed by: Pinkie Pebbles MD 03/07/2024 10:58 PM EST RP Workstation: [id number]   DG Shoulder Right Result Date: 03/07/2024 EXAM: 4 VIEW XRAY OF THE RIGHT SHOULDER 03/07/2024 09:40:41 PM COMPARISON: None available. CLINICAL HISTORY: right shoulder pain after fall. left hip pain FINDINGS: BONES AND JOINTS: Anterior dislocation of the proximal humerus with respect to glenoid. No acute fracture. Mild acromioclavicular degenerative change. SOFT TISSUES: No abnormal calcifications. Visualized lung is unremarkable. IMPRESSION: 1. Anterior shoulder dislocation. Electronically signed by: Pinkie Pebbles MD 03/07/2024 09:49 PM EST RP Workstation: [id number]   DG Hip  Unilat W or Wo Pelvis 2-3 Views Left Result Date: 03/07/2024 EXAM: 4 VIEW(S) XRAY OF THE PELVIS AND LEFT HIP 03/07/2024 09:40:41 PM COMPARISON: 03/27/2020 CLINICAL HISTORY: left hip pain FINDINGS: JOINTS: SI joints are symmetric. ORIF of left proximal femur with intramedullary rod, distal interlocking screw and two femoral neck screws in place. Bilateral hips demonstrate normal alignment. No acute fracture. SOFT TISSUES: Vascular calcifications noted. The soft tissues are otherwise unremarkable. IMPRESSION: 1. Status post ORIF of the left hip, without complication. 2. No acute findings. Electronically signed by: Pinkie Pebbles MD 03/07/2024 09:48 PM EST RP Workstation: [id number]    Microbiology: Results for orders placed or performed during the hospital encounter of 02/07/24  MRSA Next Gen by PCR, Nasal     Status: None   Collection Time: 02/13/24  9:42 AM   Specimen: Nasal Mucosa; Nasal Swab  Result Value Ref Range Status  MRSA by PCR Next Gen NOT DETECTED NOT DETECTED Final    Comment: (NOTE) The GeneXpert MRSA Assay (FDA approved for NASAL specimens only), is one component of a comprehensive MRSA colonization surveillance program. It is not intended to diagnose MRSA infection nor to guide or monitor treatment for MRSA infections. Test performance is not FDA approved in patients less than 64 years old. Performed at [facility], [address] [place], [place] [address]     Labs: CBC: Recent Labs  Lab 03/12/24 2252 03/13/24 0313 03/14/24 0631  WBC 7.6 7.3 6.5  HGB 11.1* 11.5* 10.5*  HCT 35.9* 36.8 34.3*  MCV 101.7* 101.4* 101.5*  PLT 189 196 173   Basic Metabolic Panel: Recent Labs  Lab 03/12/24 2252 03/13/24 0313  NA 140 142  K 4.0 3.7  CL 105 104  CO2 26 28  GLUCOSE 118* 137*  BUN 18 18  CREATININE 1.29* 1.32*  CALCIUM 9.2 9.3   Liver Function Tests: Recent Labs  Lab 03/12/24 2252  AST 12*  ALT 6  ALKPHOS 81  BILITOT 0.3  PROT 5.6*  ALBUMIN  3.4*   CBG: No results for input(s): GLUCAP in the last 168 hours.  Discharge time spent: 35 minutes.  Signed: Murvin Mana, MD [facility] 03/16/2024

## 2024-03-16 NOTE — Progress Notes (Signed)
 The writer called [employer] and gave report to [employer], LPN. The pt left via stretcher.

## 2024-04-18 ENCOUNTER — Emergency Department

## 2024-04-18 ENCOUNTER — Other Ambulatory Visit: Payer: Self-pay

## 2024-04-18 ENCOUNTER — Emergency Department: Admission: EM | Admit: 2024-04-18 | Discharge: 2024-04-18 | Disposition: A | Discharge status: Skilled Nursing Facility

## 2024-04-18 DIAGNOSIS — S60211A Contusion of right wrist, initial encounter: Secondary | ICD-10-CM | POA: Insufficient documentation

## 2024-04-18 DIAGNOSIS — I4891 Unspecified atrial fibrillation: Secondary | ICD-10-CM | POA: Diagnosis not present

## 2024-04-18 DIAGNOSIS — X58XXXA Exposure to other specified factors, initial encounter: Secondary | ICD-10-CM | POA: Insufficient documentation

## 2024-04-18 DIAGNOSIS — Z7901 Long term (current) use of anticoagulants: Secondary | ICD-10-CM | POA: Diagnosis not present

## 2024-04-18 DIAGNOSIS — J449 Chronic obstructive pulmonary disease, unspecified: Secondary | ICD-10-CM | POA: Diagnosis not present

## 2024-04-18 DIAGNOSIS — M25531 Pain in right wrist: Secondary | ICD-10-CM | POA: Diagnosis present

## 2024-04-18 DIAGNOSIS — R197 Diarrhea, unspecified: Secondary | ICD-10-CM | POA: Diagnosis not present

## 2024-04-18 DIAGNOSIS — M25511 Pain in right shoulder: Secondary | ICD-10-CM | POA: Insufficient documentation

## 2024-04-18 DIAGNOSIS — N189 Chronic kidney disease, unspecified: Secondary | ICD-10-CM | POA: Diagnosis not present

## 2024-04-18 DIAGNOSIS — F0390 Unspecified dementia without behavioral disturbance: Secondary | ICD-10-CM | POA: Insufficient documentation

## 2024-04-18 DIAGNOSIS — I129 Hypertensive chronic kidney disease with stage 1 through stage 4 chronic kidney disease, or unspecified chronic kidney disease: Secondary | ICD-10-CM | POA: Diagnosis not present

## 2024-04-18 DIAGNOSIS — S80811A Abrasion, right lower leg, initial encounter: Secondary | ICD-10-CM | POA: Insufficient documentation

## 2024-04-18 DIAGNOSIS — R262 Difficulty in walking, not elsewhere classified: Secondary | ICD-10-CM | POA: Insufficient documentation

## 2024-04-18 HISTORY — DX: Unspecified dementia, unspecified severity, without behavioral disturbance, psychotic disturbance, mood disturbance, and anxiety: F03.90

## 2024-04-18 LAB — COMPREHENSIVE METABOLIC PANEL WITH GFR
ALT: 6 U/L (ref 0–44)
AST: 13 U/L — ABNORMAL LOW (ref 15–41)
Albumin: 3.8 g/dL (ref 3.5–5.0)
Alkaline Phosphatase: 90 U/L (ref 38–126)
Anion gap: 10 (ref 5–15)
BUN: 13 mg/dL (ref 8–23)
CO2: 23 mmol/L (ref 22–32)
Calcium: 9.8 mg/dL (ref 8.9–10.3)
Chloride: 109 mmol/L (ref 98–111)
Creatinine, Ser: 0.98 mg/dL (ref 0.44–1.00)
GFR, Estimated: 56 mL/min — ABNORMAL LOW
Glucose, Bld: 101 mg/dL — ABNORMAL HIGH (ref 70–99)
Potassium: 3.7 mmol/L (ref 3.5–5.1)
Sodium: 142 mmol/L (ref 135–145)
Total Bilirubin: 0.5 mg/dL (ref 0.0–1.2)
Total Protein: 6.4 g/dL — ABNORMAL LOW (ref 6.5–8.1)

## 2024-04-18 LAB — CBC WITH DIFFERENTIAL/PLATELET
Abs Immature Granulocytes: 0.03 K/uL (ref 0.00–0.07)
Basophils Absolute: 0 K/uL (ref 0.0–0.1)
Basophils Relative: 0 %
Eosinophils Absolute: 0.1 K/uL (ref 0.0–0.5)
Eosinophils Relative: 1 %
HCT: 38.5 % (ref 36.0–46.0)
Hemoglobin: 12 g/dL (ref 12.0–15.0)
Immature Granulocytes: 0 %
Lymphocytes Relative: 14 %
Lymphs Abs: 1 K/uL (ref 0.7–4.0)
MCH: 31.1 pg (ref 26.0–34.0)
MCHC: 31.2 g/dL (ref 30.0–36.0)
MCV: 99.7 fL (ref 80.0–100.0)
Monocytes Absolute: 0.5 K/uL (ref 0.1–1.0)
Monocytes Relative: 7 %
Neutro Abs: 5.3 K/uL (ref 1.7–7.7)
Neutrophils Relative %: 78 %
Platelets: 181 K/uL (ref 150–400)
RBC: 3.86 MIL/uL — ABNORMAL LOW (ref 3.87–5.11)
RDW: 14.1 % (ref 11.5–15.5)
WBC: 6.9 K/uL (ref 4.0–10.5)
nRBC: 0 % (ref 0.0–0.2)

## 2024-04-18 LAB — URINALYSIS, W/ REFLEX TO CULTURE (INFECTION SUSPECTED)
Bilirubin Urine: NEGATIVE
Glucose, UA: NEGATIVE mg/dL
Hgb urine dipstick: NEGATIVE
Ketones, ur: NEGATIVE mg/dL
Nitrite: NEGATIVE
Protein, ur: NEGATIVE mg/dL
Specific Gravity, Urine: 1.014 (ref 1.005–1.030)
pH: 6 (ref 5.0–8.0)

## 2024-04-18 LAB — LACTIC ACID, PLASMA: Lactic Acid, Venous: 1.3 mmol/L (ref 0.5–1.9)

## 2024-04-18 LAB — RESP PANEL BY RT-PCR (RSV, FLU A&B, COVID)  RVPGX2
Influenza A by PCR: NEGATIVE
Influenza B by PCR: NEGATIVE
Resp Syncytial Virus by PCR: NEGATIVE
SARS Coronavirus 2 by RT PCR: NEGATIVE

## 2024-04-18 MED ORDER — ACETAMINOPHEN 500 MG PO TABS: 500.0000 mg | ORAL_TABLET | Freq: Four times a day (QID) | ORAL | 2 refills | Status: AC | PRN

## 2024-04-18 MED ORDER — LIDOCAINE 5 % EX PTCH: 1.0000 | MEDICATED_PATCH | CUTANEOUS | 0 refills | Status: AC

## 2024-04-18 MED ORDER — ACETAMINOPHEN 325 MG PO TABS
650.0000 mg | ORAL_TABLET | Freq: Once | ORAL | Status: AC
  Administered 2024-04-18: 650 mg via ORAL
  Filled 2024-04-18: qty 2

## 2024-04-18 NOTE — ED Notes (Signed)
 [facility] called for transport back to [place].  Spoke with Octaviano.

## 2024-04-18 NOTE — ED Notes (Signed)
 1 set cultures and blue top tube sent to lab.

## 2024-04-18 NOTE — Discharge Instructions (Addendum)
 Your evaluation in the emergency department was overall reassuring.  Fortunately we saw no evidence of dislocation or fracture in your right shoulder, but we did see secondary signs that may suggest a rotator cuff injury.  Please continue to follow-up with your orthopedic provider for ongoing evaluation and management of this.  You can use Tylenol  and Lidoderm  patches as needed for any ongoing discomfort.  Your workup was otherwise reassuring with no evidence of acute pathology today.  I saw no clear reason why you are having worsened difficulty than usual walking, but you can continue to use the wheelchair as needed.  Please do follow-up closely with your primary care provider for reevaluation.  Return to the emergency department with any new or worsening symptoms.

## 2024-04-18 NOTE — ED Triage Notes (Addendum)
 First nurse note: Pt to Ed via [facility] from [facility]. AMS since this am per staff. Unable to urinate since yesterday and states right wrist pain. EMS reports strong urine odor   HR 120 115 CBG  150/97 97.9 oral

## 2024-04-18 NOTE — ED Provider Notes (Signed)
 "  [facility] Provider Note    Event Date/Time   First MD Initiated Contact with Patient 04/18/24 1153     (approximate)   History   Altered Mental Status  First nurse note: Pt to Ed via [facility] from [facility]. AMS since this am per staff. Unable to urinate since yesterday and states right wrist pain. EMS reports strong urine odor   HR 120 115 CBG  150/97 97.9 oral    See first nurse note. To ED [facility] from [facility] for AMS since this AM per staff. Per EMS report, unable to urinate since yesterday and has strong urine odor. Pt is obviously confused and crying out when tourniquet placed on arm.    HPI Roberta Ross is a 89 y.o. female PMH dementia, CKD, COPD, hypertension, hyperlipidemia, RA, A-fib on Coumadin  presents for evaluation of altered mental status - Patient is somewhat limited historian.  Tells me she is here because of right arm pain.  Daughter is bedside, has not seen her since Thursday.  Does feel she is overall acting at her baseline. - Patient is not able to tell me the last time she urinated - [facility] for collateral, spoke w/ Bria (pt's med tech) -- took pt to bathroom, was in usual state of health, then said she wanted to go to the hospital. Did not have any clear complaint. Pt called her daughter, told her that her shoulder was popped out of place again, daughter asked her to be taken to the hospital. Is at baseline mental state. Did use wheelchair to get to bathroom, has been avoiding walking for the past 3-4 days. No cough, fever. +diarrhea x 2 days. Has not witnessed her urinate in past 2 days but may have done so with other people. Good PO intake.   Per chart review, last admitted 11/13-11/17/2025 after presenting for recurrent shoulder dislocation.     Physical Exam   Triage Vital Signs: ED Triage Vitals  Encounter Vitals Group     BP 04/18/24 0848 (!) 131/97     Girls  Systolic BP Percentile --      Girls Diastolic BP Percentile --      Boys Systolic BP Percentile --      Boys Diastolic BP Percentile --      Pulse Rate 04/18/24 0848 71     Resp 04/18/24 0848 18     Temp 04/18/24 0848 97.7 F (36.5 C)     Temp Source 04/18/24 0848 Oral     SpO2 04/18/24 0848 94 %     Weight 04/18/24 0854 160 lb (72.6 kg)     Height 04/18/24 0854 5' 6 (1.676 m)     Head Circumference --      Peak Flow --      Pain Score --      Pain Loc --      Pain Education --      Exclude from Growth Chart --     Most recent vital signs: Vitals:   04/18/24 0848 04/18/24 1301  BP: (!) 131/97 130/88  Pulse: 71 70  Resp: 18 18  Temp: 97.7 F (36.5 C) 98 F (36.7 C)  SpO2: 94% 94%     General: Awake, no distress.  HEENT: Normocephalic, atraumatic CV:  Good peripheral perfusion. RRR, RP 2+ Resp:  Normal effort. CTAB Abd:  No distention. Nontender to deep palpation throughout Other:  +bruising w/ mild swelling to R wrist, +  skin abrasion to R calf. Able to range R shoulder, RP 2+  Neuro:  Aox3, face symmetric, no focal motor deficit appreciated   ED Results / Procedures / Treatments   Labs (all labs ordered are listed, but only abnormal results are displayed) Labs Reviewed  COMPREHENSIVE METABOLIC PANEL WITH GFR - Abnormal; Notable for the following components:      Result Value   Glucose, Bld 101 (*)    Total Protein 6.4 (*)    AST 13 (*)    GFR, Estimated 56 (*)    All other components within normal limits  CBC WITH DIFFERENTIAL/PLATELET - Abnormal; Notable for the following components:   RBC 3.86 (*)    All other components within normal limits  URINALYSIS, W/ REFLEX TO CULTURE (INFECTION SUSPECTED) - Abnormal; Notable for the following components:   Color, Urine YELLOW (*)    APPearance CLEAR (*)    Leukocytes,Ua TRACE (*)    Bacteria, UA RARE (*)    All other components within normal limits  RESP PANEL BY RT-PCR (RSV, FLU A&B, COVID)  [id number]  LACTIC  ACID, PLASMA  LACTIC ACID, PLASMA     EKG  N/a   RADIOLOGY Radiology interpreted by myself and radiology report reviewed.  No acute pathology identified.  Some remote strokes identified    PROCEDURES:  Critical Care performed: No  Procedures   MEDICATIONS ORDERED IN ED: Medications  acetaminophen  (TYLENOL ) tablet 650 mg (650 mg Oral Given 04/18/24 1302)     IMPRESSION / MDM / ASSESSMENT AND PLAN / ED COURSE  I reviewed the triage vital signs and the nursing notes.                              DDX/MDM/AP: Differential diagnosis includes, but is not limited to, ongoing pain from recent recurrent shoulder dislocations, clinically doubt shoulder dislocation or fracture based on exam though given age and history will screen with x-ray.  Suspect swelling from attempts at recent blood draws on R wrist, doubt underlying fracture, will screen with x-ray.  Regarding mental status, does appear to be at baseline per family and facility contrary to EMS report, though she is newly having difficulty walking over the past few days I have been considered but doubt acute intracranial pathology, will screen with CT head given age and poor historian.  Consider underlying mild infection such as pneumonia, UTI.  Also consider viral syndrome given recent diarrhea, will screen for flu and COVID.  Plan: - Labs - Chest x-ray, XR right shoulder, XR right wrist - CT head - Tylenol  - Reassess  Patient's presentation is most consistent with acute presentation with potential threat to life or bodily function.  The patient is on the cardiac monitor to evaluate for evidence of arrhythmia and/or significant heart rate changes.  ED course below.   Clinical Course as of 04/18/24 1404  Sat Apr 18, 2024  1201 CBC and CMP reviewed, unremarkable Lactate wnl [MM]  1201 CXR: IMPRESSION: 1. Low lung volumes with hazy bibasilar opacities, possibly representing atelectasis and/or consolidation. 2. Mild  pulmonary edema, possibly accentuated by low lung volumes. 3. Possible trace bilateral pleural effusions. 4. Mild cardiomegaly and aortic arch calcifications.   [MM]  1321 Viral swab neg [MM]  1328 UA not c/w infxn [MM]  1329 XR R shoulder: IMPRESSION: 1. High-riding humeral head with narrowing of the acromiohumeral interval, suggestive of chronic rotator cuff pathology. 2. No acute findings.   [  MM]  1356 CTH: IMPRESSION: 1. No acute intracranial abnormality. 2. Moderate atrophy and white matter changes, stable. 3. Remote lacunar infarcts in the right lentiform nucleus and inferior right cerebellum.   [MM]  1356 XR R wrist: IMPRESSION: 1. Radiocarpal and ulnar carpal osteoarthritis. 2. Subluxations of the second through fifth metacarpophalangeal joints, not well assessed on this wrist exam. This is typically seen with inflammatory arthritis such as rheumatoid. 3. No acute osseous abnormality.   [MM]  1403 Patient and family reevaluated, reassured by unremarkable workup here.  No evidence of acute pathology.  Remote strokes identified, discussed with family, plan for outpatient follow-up.  Suspect some element of deconditioning from recent hospitalizations contributing to her subacute decline in difficulty walking.  Has appropriate support at this time with a wheelchair available at her assisted living facility.  Rx Tylenol , Lidoderm  patches as needed for ongoing discomfort of right shoulder, suspect ligamentous injury with x-ray showing sign of possible rotator cuff injury, is already being followed by orthopedics.  Flu negative, urinalysis negative, stable for outpatient follow-up.  ED return precautions in place.  Patient and family agree with plan. [MM]    Clinical Course User Index [MM] Clarine Ozell LABOR, MD     FINAL CLINICAL IMPRESSION(S) / ED DIAGNOSES   Final diagnoses:  Right shoulder pain, unspecified chronicity  Difficulty walking     Rx / DC Orders   ED  Discharge Orders          Ordered    acetaminophen  (TYLENOL ) 500 MG tablet  Every 6 hours PRN        04/18/24 1358    lidocaine  (LIDODERM ) 5 %  Every 24 hours        04/18/24 1358             Note:  This document was prepared using Dragon voice recognition software and may include unintentional dictation errors.   Clarine Ozell LABOR, MD 04/18/24 1404  "

## 2024-04-18 NOTE — ED Triage Notes (Signed)
 See first nurse note. To ED [facility] from [facility] care for AMS since this AM per staff. Per EMS report, unable to urinate since yesterday and has strong urine odor. Pt is obviously confused and crying out when tourniquet placed on arm.

## 2024-04-18 NOTE — ED Notes (Signed)
Assisted pt onto the bed pan.

## 2024-04-18 NOTE — ED Notes (Signed)
 Daughter going to car to lay down and wanting to be contacted via phone

## 2024-04-28 ENCOUNTER — Other Ambulatory Visit (INDEPENDENT_AMBULATORY_CARE_PROVIDER_SITE_OTHER): Payer: Self-pay | Admitting: Vascular Surgery

## 2024-04-28 DIAGNOSIS — I82413 Acute embolism and thrombosis of femoral vein, bilateral: Secondary | ICD-10-CM

## 2024-05-04 ENCOUNTER — Other Ambulatory Visit: Payer: Self-pay

## 2024-05-04 DIAGNOSIS — Z888 Allergy status to other drugs, medicaments and biological substances status: Secondary | ICD-10-CM

## 2024-05-04 DIAGNOSIS — G9340 Encephalopathy, unspecified: Secondary | ICD-10-CM | POA: Diagnosis present

## 2024-05-04 DIAGNOSIS — Z7952 Long term (current) use of systemic steroids: Secondary | ICD-10-CM

## 2024-05-04 DIAGNOSIS — R791 Abnormal coagulation profile: Secondary | ICD-10-CM | POA: Diagnosis present

## 2024-05-04 DIAGNOSIS — K649 Unspecified hemorrhoids: Secondary | ICD-10-CM | POA: Diagnosis present

## 2024-05-04 DIAGNOSIS — B952 Enterococcus as the cause of diseases classified elsewhere: Secondary | ICD-10-CM | POA: Diagnosis present

## 2024-05-04 DIAGNOSIS — D631 Anemia in chronic kidney disease: Secondary | ICD-10-CM | POA: Diagnosis present

## 2024-05-04 DIAGNOSIS — I5022 Chronic systolic (congestive) heart failure: Secondary | ICD-10-CM | POA: Diagnosis present

## 2024-05-04 DIAGNOSIS — W1811XA Fall from or off toilet without subsequent striking against object, initial encounter: Secondary | ICD-10-CM | POA: Diagnosis present

## 2024-05-04 DIAGNOSIS — Z86718 Personal history of other venous thrombosis and embolism: Secondary | ICD-10-CM

## 2024-05-04 DIAGNOSIS — M84361A Stress fracture, right tibia, initial encounter for fracture: Secondary | ICD-10-CM | POA: Diagnosis present

## 2024-05-04 DIAGNOSIS — Z90710 Acquired absence of both cervix and uterus: Secondary | ICD-10-CM

## 2024-05-04 DIAGNOSIS — M069 Rheumatoid arthritis, unspecified: Secondary | ICD-10-CM | POA: Diagnosis present

## 2024-05-04 DIAGNOSIS — Z8049 Family history of malignant neoplasm of other genital organs: Secondary | ICD-10-CM

## 2024-05-04 DIAGNOSIS — B961 Klebsiella pneumoniae [K. pneumoniae] as the cause of diseases classified elsewhere: Secondary | ICD-10-CM | POA: Diagnosis present

## 2024-05-04 DIAGNOSIS — Z825 Family history of asthma and other chronic lower respiratory diseases: Secondary | ICD-10-CM

## 2024-05-04 DIAGNOSIS — Z66 Do not resuscitate: Secondary | ICD-10-CM | POA: Diagnosis present

## 2024-05-04 DIAGNOSIS — I130 Hypertensive heart and chronic kidney disease with heart failure and stage 1 through stage 4 chronic kidney disease, or unspecified chronic kidney disease: Secondary | ICD-10-CM | POA: Diagnosis present

## 2024-05-04 DIAGNOSIS — Z881 Allergy status to other antibiotic agents status: Secondary | ICD-10-CM

## 2024-05-04 DIAGNOSIS — J449 Chronic obstructive pulmonary disease, unspecified: Secondary | ICD-10-CM | POA: Diagnosis present

## 2024-05-04 DIAGNOSIS — D849 Immunodeficiency, unspecified: Secondary | ICD-10-CM | POA: Diagnosis present

## 2024-05-04 DIAGNOSIS — Z8042 Family history of malignant neoplasm of prostate: Secondary | ICD-10-CM

## 2024-05-04 DIAGNOSIS — F0392 Unspecified dementia, unspecified severity, with psychotic disturbance: Secondary | ICD-10-CM | POA: Diagnosis present

## 2024-05-04 DIAGNOSIS — Z79899 Other long term (current) drug therapy: Secondary | ICD-10-CM

## 2024-05-04 DIAGNOSIS — Z8249 Family history of ischemic heart disease and other diseases of the circulatory system: Secondary | ICD-10-CM

## 2024-05-04 DIAGNOSIS — E7800 Pure hypercholesterolemia, unspecified: Secondary | ICD-10-CM | POA: Diagnosis present

## 2024-05-04 DIAGNOSIS — N1831 Chronic kidney disease, stage 3a: Secondary | ICD-10-CM | POA: Diagnosis present

## 2024-05-04 DIAGNOSIS — K921 Melena: Secondary | ICD-10-CM | POA: Diagnosis present

## 2024-05-04 DIAGNOSIS — D62 Acute posthemorrhagic anemia: Secondary | ICD-10-CM | POA: Diagnosis present

## 2024-05-04 DIAGNOSIS — Z87891 Personal history of nicotine dependence: Secondary | ICD-10-CM

## 2024-05-04 DIAGNOSIS — S81801A Unspecified open wound, right lower leg, initial encounter: Principal | ICD-10-CM | POA: Diagnosis present

## 2024-05-04 DIAGNOSIS — M109 Gout, unspecified: Secondary | ICD-10-CM | POA: Diagnosis present

## 2024-05-04 DIAGNOSIS — Z7901 Long term (current) use of anticoagulants: Secondary | ICD-10-CM

## 2024-05-04 DIAGNOSIS — L03115 Cellulitis of right lower limb: Secondary | ICD-10-CM | POA: Diagnosis present

## 2024-05-04 DIAGNOSIS — S83241A Other tear of medial meniscus, current injury, right knee, initial encounter: Secondary | ICD-10-CM | POA: Diagnosis present

## 2024-05-04 DIAGNOSIS — Z880 Allergy status to penicillin: Secondary | ICD-10-CM

## 2024-05-04 DIAGNOSIS — I480 Paroxysmal atrial fibrillation: Secondary | ICD-10-CM | POA: Diagnosis present

## 2024-05-04 LAB — CBC
HCT: 36.5 % (ref 36.0–46.0)
Hemoglobin: 11.6 g/dL — ABNORMAL LOW (ref 12.0–15.0)
MCH: 30.3 pg (ref 26.0–34.0)
MCHC: 31.8 g/dL (ref 30.0–36.0)
MCV: 95.3 fL (ref 80.0–100.0)
Platelets: 382 K/uL (ref 150–400)
RBC: 3.83 MIL/uL — ABNORMAL LOW (ref 3.87–5.11)
RDW: 13.5 % (ref 11.5–15.5)
WBC: 10.7 K/uL — ABNORMAL HIGH (ref 4.0–10.5)
nRBC: 0 % (ref 0.0–0.2)

## 2024-05-04 LAB — BASIC METABOLIC PANEL WITH GFR
Anion gap: 11 (ref 5–15)
BUN: 20 mg/dL (ref 8–23)
CO2: 29 mmol/L (ref 22–32)
Calcium: 10.4 mg/dL — ABNORMAL HIGH (ref 8.9–10.3)
Chloride: 100 mmol/L (ref 98–111)
Creatinine, Ser: 1 mg/dL (ref 0.44–1.00)
GFR, Estimated: 54 mL/min — ABNORMAL LOW
Glucose, Bld: 131 mg/dL — ABNORMAL HIGH (ref 70–99)
Potassium: 3.8 mmol/L (ref 3.5–5.1)
Sodium: 139 mmol/L (ref 135–145)

## 2024-05-04 LAB — LACTIC ACID, PLASMA: Lactic Acid, Venous: 1.9 mmol/L (ref 0.5–1.9)

## 2024-05-04 NOTE — Progress Notes (Signed)
 Chief Complaint: Chief Complaint  Patient presents with   Post Operative Visit    CLOSED REDUCTION, SHOULDER-11.13.2025/POGGI   Roberta Ross is a 89 y.o. female who presents today for her second postop appointment following a closed reduction of the right shoulder anterior dislocation.  The patient underwent this procedure with Dr. Edie on 03/12/2024.  The patient had initially presented to the emergency room previously where she was diagnosed with a right shoulder dislocation and did undergo a successful closed reduction in the emergency room however when she returned to her assisted living center she promptly remove the shoulder immobilizer and suffered a recurrent dislocation.  Several attempts were made to relocate the shoulder emergency room however this was unsuccessful care was performed.  The patient was placed into a shoulder immobilizer.  While she was in the hospital we discussed proceeding with a CT scan as she has likely suffered a rotator cuff tear at the same time of the dislocation which does potentially increase her risk of recurrent dislocations.  She is adamant on avoiding any surgical intervention and because of this a CT scan was not ordered.  She was initially evaluated on 04/03/2024, at this appointment x-rays of the right shoulder did not reveal any evidence of a recurrent dislocation.  She was over 3 weeks status post the initial reduction, she was removed from the shoulder immobilizer.  She was instructed to work on gentle range of motion but avoid any external rotation.  At today's visit she states that she has not done much in the way of motion to the right shoulder.  She reports an 8 out of 10 pain score at today's appointment.  She is right-hand dominant.  She does report moderate swelling in the right hand and does have a small laceration along the dorsal aspect of the wrist but denies any recent trauma or injury.  She is still adamant on avoiding any surgical intervention  at this time.  She presents today with her granddaughter.  Past Medical History: Past Medical History:  Diagnosis Date   Anemia    Arthritis    Rheumatoid   CHF (congestive heart failure) (CMS/HHS-HCC)    Chickenpox    COPD (chronic obstructive pulmonary disease) (CMS/HHS-HCC)    Depression    Dermatophytosis of nail    Exostosis    FH: cataracts    Hammertoe    Hemorrhoids    Pure hypercholesterolemia    Rheumatoid arthritis(714.0) (CMS/HHS-HCC)     Past Surgical History: Past Surgical History:  Procedure Laterality Date   CATARACT EXTRACTION Left 2016   FRACTURE SURGERY Left 02/2020   left femur   CLOSED REDUCTION SHOULDER DISLOCATION Right 03/12/2024   Dr. Edie   APPENDECTOMY     breast nodule     CHOLECYSTECTOMY     finger nodule removed     giant cell   FRACTURE SURGERY     right wrist   HYSTERECTOMY     TAH   TONSILLECTOMY     vaginal nodule      Past Family History: Family History  Problem Relation Age of Onset   Cancer Mother    Rheum arthritis Mother    Dementia Mother    Heart disease Father    Prostate cancer Father    Dementia Sister    Rheum arthritis Daughter     Medications: Current Outpatient Medications  Medication Sig Dispense Refill   clindamycin  (CLEOCIN ) 300 MG capsule      lidocaine  (LIDODERM ) 5 %  patch      meloxicam (MOBIC) 7.5 MG tablet      acetaminophen  (TYLENOL  ARTHRITIS PAIN ORAL) Take 1 tablet by mouth 2 (two) times daily     albuterol  90 mcg/actuation inhaler Inhale into the lungs     allopurinoL  (ZYLOPRIM ) 100 MG tablet Take 50 mg by mouth once daily     ammonium lactate (LAC-HYDRIN) 12 % lotion Apply 1 Application topically     diclofenac  (VOLTAREN ) 1 % topical gel APPLY 4 GRAMS TOPICALLY TO BOTH KNEES FOUR TIMES DAILY FOR PAIN     dilTIAZem  (CARDIZEM  CD) 240 MG CD capsule TAKE 1 CAPSULE BY MOUTH EVERY DAY 90 capsule 1   ergocalciferol, vitamin D2, 1,250 mcg (50,000 unit)  capsule Take 50,000 Units by mouth every 7 (seven) days     fluticasone propion-salmeteroL (ADVAIR DISKUS) 100-50 mcg/dose diskus inhaler Inhale 1 Puff into the lungs every 12 (twelve) hours 1 each 12   folic acid  (FOLVITE ) 400 MCG tablet Take 400 mcg by mouth     melatonin-pyridoxine, vit B6, (MELATONIN, WITH B6,) 5-1 mg Tab Take 5 mg by mouth at bedtime     oxyCODONE  (ROXICODONE ) 5 MG immediate release tablet Take 2.5-5 mg by mouth     polyethylene glycol (MIRALAX ) packet Take 17 g by mouth once daily     potassium chloride  (KLOR-CON ) 10 MEQ ER tablet Take 10 mEq by mouth once daily     predniSONE  (DELTASONE ) 5 MG tablet Take 5 mg by mouth once daily     sennosides-docusate (SENOKOT-S) 8.6-50 mg tablet Take 1 tablet by mouth once daily     sertraline  (ZOLOFT ) 50 MG tablet Take 50 mg by mouth once daily     simvastatin  (ZOCOR ) 10 MG tablet Take 10 mg by mouth at bedtime     tiotropium (SPIRIVA) 18 mcg inhalation capsule Place 1 capsule (18 mcg total) into inhaler and inhale once daily 30 capsule 11   TORsemide  (DEMADEX ) 20 MG tablet Take 2 tablets (40 mg total) by mouth once daily 60 tablet 11   traMADoL  (ULTRAM ) 50 mg tablet Take 1 tablet (50 mg total) by mouth every 6 (six) hours as needed for Pain (Patient not taking: Reported on 04/03/2024) 30 tablet 2   warfarin (COUMADIN ) 4 MG tablet TAKE 1 TABLET BY MOUTH ONCE DAILY FOR AFIB     zinc  oxide 20 % ointment Apply topically     No current facility-administered medications for this visit.    Allergies: Allergies  Allergen Reactions   Metoprolol Succinate Shortness Of Breath   Methotrexate Unknown   Oraxyl [Doxycycline Hyclate] Rash   Penicillins Rash    Patient tolerated ceftriaxone  on admission of 06/24/17     Review of Systems:  A comprehensive 14 point ROS was performed, reviewed by me today, and the pertinent orthopaedic findings are documented in the HPI.   Exam: BP 110/74   Ht 159 cm (5' 2.6)   BMI 25.91  kg/m  General/Constitutional: The patient appears to be well-nourished, well-developed, and in no acute distress. Neuro/Psych: Normal mood and affect, oriented to person, place and time. Eyes: Non-icteric.  Pupils are equal, round, and reactive to light, and exhibit synchronous movement. ENT: Unremarkable. Lymphatic: No palpable adenopathy. Respiratory: Non-labored breathing Cardiovascular: No edema, swelling or tenderness, except as noted in detailed exam. Integumentary: No impressive skin lesions present, except as noted in detailed exam. Musculoskeletal: Unremarkable, except as noted in detailed exam.  The patient presents today without any protective devices applied to the  right shoulder.  Skin examination of the right shoulder demonstrates no open wound, erythema or ecchymosis.  Skin examination of the right wrist does reveal a small laceration along the dorsal aspect the wrist over the ulnar styloid, no protuberance of bone can be identified.  There appears to be good granulation tissue over this area without any purulence.  She has intact right wrist range of motion, there are significant deformities of the fingers due to her history of rheumatoid arthritis with moderate swelling.  She is able to gently flex and extend the right wrist.  No pain with passive right elbow flexion or extension.  The patient is able to tolerate gentle forward flexion at the right shoulder close to 75 degrees and abduction close to 60 degrees.  No external rotation was tested today.  The patient is able to slightly internally rotate across her belly.  She is intact light touch throughout the right upper extremity.  Cap refills intact to each individual digit.  Radial and ulnar pulse are intact to right wrist.  There are several superficial skin abrasions from her right shoulder immobilizer.  Imaging: AP and Y scapular views of the right shoulder were obtained today in the office and reviewed by me.  These x-rays  demonstrate that the humeral head is located within the glenohumeral joint space without any evidence for acute dislocation.  There does appear to be evidence of a potential bony [place] along the inferior aspect the glenoid.  Slight loss of glenohumeral joint space.  No other acute abnormalities identified when compared to previous x-rays.  Impression: Anterior dislocation of right shoulder, sequela [S43.014S] Anterior dislocation of right shoulder, sequela  (primary encounter diagnosis) Right rotator cuff tear arthropathy Arm wound, right, initial encounter  Plan:  1.  Treatment options were discussed today with the patient. 2.  The patient presents today for repeat evaluation status post a closed reduction of a right anterior shoulder dislocation. 3.  X-rays at today's visit continue to demonstrate no evidence of dislocation.  She may continue to perform activities as tolerated to the right upper extremity, new order for therapy was placed for the patient. 4.  We once again had a very detailed discussion with both the patient and her granddaughter.  We discussed that she most likely has suffered a chronic rotator cuff tear due to her recent dislocations.  We discussed with her that there is a higher than normal chance that she suffers a recurrent dislocation.  We discussed that the surgical course of action would be to proceed with a right reverse total shoulder arthroplasty however she is adamant on avoiding any surgical intervention at this time, 5.  She may gradually increase her activities as tolerated to the right upper extremity but avoid improving activities. 6.  The patient will follow-up with me in 1 month to undergo repeat x-rays of the right shoulder.  They can call the clinic they have any questions, new symptoms develop or symptoms worsen.  This office visit took 45 minutes, of which >50% involved patient counseling/education.  Review of the [place] CSRS was performed in accordance of  the [employer] prior to dispensing any controlled drugs.  This note was generated in part with voice recognition software and I apologize for any typographical errors that were not detected and corrected.  DOROTHA Gustavo Level, PA-C, CAQ-OS [facility]

## 2024-05-04 NOTE — ED Triage Notes (Signed)
 Pt comes via EMS from [facility] with c/o wound to right leg. Pt wound is not getting better. Pt may have gotten it from fall month ago. Pt has been on meds. Facility can't handle it. Odor noted per facility bandage in place.

## 2024-05-05 ENCOUNTER — Inpatient Hospital Stay
Admission: EM | Admit: 2024-05-05 | Discharge: 2024-05-11 | DRG: 605 | Disposition: A | Discharge status: Skilled Nursing Facility | Source: Skilled Nursing Facility | Attending: Family Medicine | Admitting: Family Medicine

## 2024-05-05 ENCOUNTER — Inpatient Hospital Stay

## 2024-05-05 DIAGNOSIS — S81801A Unspecified open wound, right lower leg, initial encounter: Secondary | ICD-10-CM | POA: Diagnosis present

## 2024-05-05 DIAGNOSIS — M109 Gout, unspecified: Secondary | ICD-10-CM | POA: Diagnosis present

## 2024-05-05 DIAGNOSIS — D62 Acute posthemorrhagic anemia: Secondary | ICD-10-CM | POA: Diagnosis present

## 2024-05-05 DIAGNOSIS — Z792 Long term (current) use of antibiotics: Secondary | ICD-10-CM | POA: Diagnosis not present

## 2024-05-05 DIAGNOSIS — Z7952 Long term (current) use of systemic steroids: Secondary | ICD-10-CM

## 2024-05-05 DIAGNOSIS — J449 Chronic obstructive pulmonary disease, unspecified: Secondary | ICD-10-CM | POA: Diagnosis present

## 2024-05-05 DIAGNOSIS — W1811XA Fall from or off toilet without subsequent striking against object, initial encounter: Secondary | ICD-10-CM | POA: Diagnosis present

## 2024-05-05 DIAGNOSIS — D631 Anemia in chronic kidney disease: Secondary | ICD-10-CM | POA: Diagnosis present

## 2024-05-05 DIAGNOSIS — I4891 Unspecified atrial fibrillation: Secondary | ICD-10-CM

## 2024-05-05 DIAGNOSIS — B961 Klebsiella pneumoniae [K. pneumoniae] as the cause of diseases classified elsewhere: Secondary | ICD-10-CM | POA: Diagnosis present

## 2024-05-05 DIAGNOSIS — L03115 Cellulitis of right lower limb: Principal | ICD-10-CM

## 2024-05-05 DIAGNOSIS — Z7901 Long term (current) use of anticoagulants: Secondary | ICD-10-CM

## 2024-05-05 DIAGNOSIS — Z880 Allergy status to penicillin: Secondary | ICD-10-CM | POA: Diagnosis not present

## 2024-05-05 DIAGNOSIS — I82409 Acute embolism and thrombosis of unspecified deep veins of unspecified lower extremity: Secondary | ICD-10-CM | POA: Diagnosis present

## 2024-05-05 DIAGNOSIS — I130 Hypertensive heart and chronic kidney disease with heart failure and stage 1 through stage 4 chronic kidney disease, or unspecified chronic kidney disease: Secondary | ICD-10-CM | POA: Diagnosis present

## 2024-05-05 DIAGNOSIS — E7800 Pure hypercholesterolemia, unspecified: Secondary | ICD-10-CM | POA: Diagnosis present

## 2024-05-05 DIAGNOSIS — Z79899 Other long term (current) drug therapy: Secondary | ICD-10-CM

## 2024-05-05 DIAGNOSIS — R791 Abnormal coagulation profile: Secondary | ICD-10-CM

## 2024-05-05 DIAGNOSIS — I10 Essential (primary) hypertension: Secondary | ICD-10-CM | POA: Diagnosis present

## 2024-05-05 DIAGNOSIS — I5020 Unspecified systolic (congestive) heart failure: Secondary | ICD-10-CM | POA: Insufficient documentation

## 2024-05-05 DIAGNOSIS — N1831 Chronic kidney disease, stage 3a: Secondary | ICD-10-CM | POA: Diagnosis present

## 2024-05-05 DIAGNOSIS — D849 Immunodeficiency, unspecified: Secondary | ICD-10-CM | POA: Diagnosis present

## 2024-05-05 DIAGNOSIS — T148XXA Other injury of unspecified body region, initial encounter: Secondary | ICD-10-CM | POA: Diagnosis present

## 2024-05-05 DIAGNOSIS — Z8249 Family history of ischemic heart disease and other diseases of the circulatory system: Secondary | ICD-10-CM | POA: Diagnosis not present

## 2024-05-05 DIAGNOSIS — I5022 Chronic systolic (congestive) heart failure: Secondary | ICD-10-CM | POA: Diagnosis present

## 2024-05-05 DIAGNOSIS — L089 Local infection of the skin and subcutaneous tissue, unspecified: Secondary | ICD-10-CM | POA: Diagnosis present

## 2024-05-05 DIAGNOSIS — F0392 Unspecified dementia, unspecified severity, with psychotic disturbance: Secondary | ICD-10-CM | POA: Diagnosis present

## 2024-05-05 DIAGNOSIS — M069 Rheumatoid arthritis, unspecified: Secondary | ICD-10-CM | POA: Diagnosis present

## 2024-05-05 DIAGNOSIS — Z87891 Personal history of nicotine dependence: Secondary | ICD-10-CM | POA: Diagnosis not present

## 2024-05-05 DIAGNOSIS — G9340 Encephalopathy, unspecified: Secondary | ICD-10-CM | POA: Diagnosis present

## 2024-05-05 DIAGNOSIS — Z66 Do not resuscitate: Secondary | ICD-10-CM | POA: Diagnosis present

## 2024-05-05 DIAGNOSIS — B952 Enterococcus as the cause of diseases classified elsewhere: Secondary | ICD-10-CM | POA: Diagnosis present

## 2024-05-05 DIAGNOSIS — R9431 Abnormal electrocardiogram [ECG] [EKG]: Secondary | ICD-10-CM

## 2024-05-05 DIAGNOSIS — L98493 Non-pressure chronic ulcer of skin of other sites with necrosis of muscle: Secondary | ICD-10-CM | POA: Diagnosis not present

## 2024-05-05 DIAGNOSIS — K625 Hemorrhage of anus and rectum: Secondary | ICD-10-CM

## 2024-05-05 DIAGNOSIS — K921 Melena: Secondary | ICD-10-CM | POA: Diagnosis present

## 2024-05-05 DIAGNOSIS — I480 Paroxysmal atrial fibrillation: Secondary | ICD-10-CM | POA: Diagnosis present

## 2024-05-05 DIAGNOSIS — M84361A Stress fracture, right tibia, initial encounter for fracture: Secondary | ICD-10-CM

## 2024-05-05 LAB — RESP PANEL BY RT-PCR (RSV, FLU A&B, COVID)  RVPGX2
Influenza A by PCR: NEGATIVE
Influenza B by PCR: NEGATIVE
Resp Syncytial Virus by PCR: NEGATIVE
SARS Coronavirus 2 by RT PCR: NEGATIVE

## 2024-05-05 LAB — MAGNESIUM: Magnesium: 2.2 mg/dL (ref 1.7–2.4)

## 2024-05-05 LAB — PROTIME-INR
INR: 6.2 (ref 0.8–1.2)
Prothrombin Time: 57.7 s — ABNORMAL HIGH (ref 11.4–15.2)

## 2024-05-05 MED ORDER — SERTRALINE HCL 50 MG PO TABS
50.0000 mg | ORAL_TABLET | Freq: Every day | ORAL | Status: DC
  Administered 2024-05-05 – 2024-05-11 (×7): 50 mg via ORAL
  Filled 2024-05-05 (×7): qty 1

## 2024-05-05 MED ORDER — GADOBUTROL 1 MMOL/ML IV SOLN
7.0000 mL | Freq: Once | INTRAVENOUS | Status: AC | PRN
  Administered 2024-05-05: 7 mL via INTRAVENOUS

## 2024-05-05 MED ORDER — ONDANSETRON HCL 4 MG/2ML IJ SOLN
4.0000 mg | Freq: Once | INTRAMUSCULAR | Status: AC
  Administered 2024-05-05: 4 mg via INTRAVENOUS
  Filled 2024-05-05: qty 2

## 2024-05-05 MED ORDER — ACETAMINOPHEN 650 MG RE SUPP: 650.0000 mg | Freq: Four times a day (QID) | RECTAL | Status: DC | PRN

## 2024-05-05 MED ORDER — ALBUTEROL SULFATE (2.5 MG/3ML) 0.083% IN NEBU: 2.5000 mg | INHALATION_SOLUTION | RESPIRATORY_TRACT | Status: DC | PRN

## 2024-05-05 MED ORDER — SODIUM CHLORIDE 0.9 % IV SOLN: 1.0000 g | INTRAVENOUS | Status: DC

## 2024-05-05 MED ORDER — DILTIAZEM HCL ER 240 MG PO TB24
240.0000 mg | ORAL_TABLET | Freq: Every day | ORAL | Status: DC
  Filled 2024-05-05: qty 1

## 2024-05-05 MED ORDER — DILTIAZEM HCL-DEXTROSE 125-5 MG/125ML-% IV SOLN (PREMIX)
5.0000 mg/h | INTRAVENOUS | Status: DC
  Administered 2024-05-05: 5 mg/h via INTRAVENOUS
  Filled 2024-05-05: qty 125

## 2024-05-05 MED ORDER — VANCOMYCIN HCL IN DEXTROSE 1-5 GM/200ML-% IV SOLN
1000.0000 mg | INTRAVENOUS | Status: DC
  Administered 2024-05-05 – 2024-05-06 (×2): 1000 mg via INTRAVENOUS
  Filled 2024-05-05 (×2): qty 200

## 2024-05-05 MED ORDER — ACETAMINOPHEN 325 MG PO TABS
650.0000 mg | ORAL_TABLET | Freq: Four times a day (QID) | ORAL | Status: DC | PRN
  Administered 2024-05-05 – 2024-05-07 (×3): 650 mg via ORAL
  Filled 2024-05-05 (×3): qty 2

## 2024-05-05 MED ORDER — MELATONIN 5 MG PO TABS
5.0000 mg | ORAL_TABLET | Freq: Every day | ORAL | Status: DC
  Administered 2024-05-05 – 2024-05-10 (×6): 5 mg via ORAL
  Filled 2024-05-05 (×6): qty 1

## 2024-05-05 MED ORDER — DILTIAZEM HCL ER 120 MG PO TB24
240.0000 mg | ORAL_TABLET | Freq: Every day | ORAL | Status: DC
  Administered 2024-05-05 – 2024-05-11 (×7): 240 mg via ORAL
  Filled 2024-05-05: qty 1
  Filled 2024-05-05 (×6): qty 2
  Filled 2024-05-05: qty 1
  Filled 2024-05-05 (×4): qty 2
  Filled 2024-05-05: qty 1

## 2024-05-05 MED ORDER — FENTANYL CITRATE (PF) 50 MCG/ML IJ SOSY
50.0000 ug | PREFILLED_SYRINGE | Freq: Once | INTRAMUSCULAR | Status: AC
  Administered 2024-05-05: 50 ug via INTRAVENOUS
  Filled 2024-05-05: qty 1

## 2024-05-05 MED ORDER — PROCHLORPERAZINE EDISYLATE 10 MG/2ML IJ SOLN
10.0000 mg | Freq: Four times a day (QID) | INTRAMUSCULAR | Status: DC | PRN
  Administered 2024-05-07: 10 mg via INTRAVENOUS
  Filled 2024-05-05 (×2): qty 2

## 2024-05-05 MED ORDER — ALLOPURINOL 100 MG PO TABS
50.0000 mg | ORAL_TABLET | Freq: Every day | ORAL | Status: DC
  Administered 2024-05-05 – 2024-05-11 (×7): 50 mg via ORAL
  Filled 2024-05-05 (×4): qty 1
  Filled 2024-05-05: qty 0.5
  Filled 2024-05-05 (×2): qty 1

## 2024-05-05 MED ORDER — SODIUM CHLORIDE 0.9 % IV SOLN: INTRAVENOUS | Status: DC

## 2024-05-05 MED ORDER — PREDNISONE 10 MG PO TABS
5.0000 mg | ORAL_TABLET | Freq: Every day | ORAL | Status: DC
  Administered 2024-05-05 – 2024-05-11 (×7): 5 mg via ORAL
  Filled 2024-05-05 (×7): qty 1

## 2024-05-05 MED ORDER — ALBUTEROL SULFATE HFA 108 (90 BASE) MCG/ACT IN AERS: 2.0000 | INHALATION_SPRAY | RESPIRATORY_TRACT | Status: DC | PRN

## 2024-05-05 MED ORDER — WARFARIN - PHARMACIST DOSING INPATIENT
Freq: Every day | Status: DC
  Filled 2024-05-05: qty 1

## 2024-05-05 MED ORDER — VANCOMYCIN HCL 1750 MG/350ML IV SOLN
1750.0000 mg | Freq: Once | INTRAVENOUS | Status: AC
  Administered 2024-05-05: 1750 mg via INTRAVENOUS
  Filled 2024-05-05: qty 350

## 2024-05-05 MED ORDER — SODIUM CHLORIDE 0.9 % IV SOLN
2.0000 g | Freq: Once | INTRAVENOUS | Status: AC
  Administered 2024-05-05: 2 g via INTRAVENOUS
  Filled 2024-05-05: qty 12.5

## 2024-05-05 MED ORDER — SIMVASTATIN 20 MG PO TABS
10.0000 mg | ORAL_TABLET | Freq: Every evening | ORAL | Status: DC
  Administered 2024-05-05 – 2024-05-11 (×7): 10 mg via ORAL
  Filled 2024-05-05 (×7): qty 1

## 2024-05-05 MED ORDER — OXYCODONE HCL 5 MG PO TABS
2.5000 mg | ORAL_TABLET | Freq: Four times a day (QID) | ORAL | Status: DC | PRN
  Administered 2024-05-10: 5 mg via ORAL
  Filled 2024-05-05: qty 1

## 2024-05-05 MED ORDER — POLYETHYLENE GLYCOL 3350 17 G PO PACK
17.0000 g | PACK | Freq: Every day | ORAL | Status: DC
  Administered 2024-05-05 – 2024-05-11 (×5): 17 g via ORAL
  Filled 2024-05-05 (×5): qty 1

## 2024-05-05 NOTE — Consult Note (Signed)
 WOC Nurse Consult Note:  WOC consult performed remotely utilizing imaging and chart review  Reason for Consult: lower extremity non-healing wound  Wound type: RLE full thickness wound- traumatic post injury in mid December Pressure Injury POA: NA- not pressure Measurement: see nursing flow sheets Wound bed: red, moist with evidence of bleeding and coagulation Drainage (amount, consistency, odor) sanguinous Periwound: intact Dressing procedure/placement/frequency:  RLE: Cleanse with NS, pat dry.  Apply Xeroform to wound bed and cover with Silicone foam dressing.  Change daily.      WOC team will not follow patient at this time, please re consult if new needs arise.  Thank you,  Doyal Polite, MSN, RN, [facility] WOC Team [phone] (Available Mon-Fri 0700-1500)

## 2024-05-05 NOTE — Progress Notes (Addendum)
 Elink monitoring for the code sepsis protocol.  9694 Verified with Bedside Rn that Blood Cultures were drawn prior to Abx start. First set at 0120 and second at 0125.

## 2024-05-05 NOTE — Consult Note (Addendum)
 PHARMACY - ANTICOAGULATION CONSULT NOTE  Pharmacy Consult for warfarin dosing Indication: atrial fibrillation  Allergies[1]  Patient Measurements: Weight: 72.6 kg (160 lb) (From O/V note on 04/18/24)  Vital Signs: Temp: 97.8 F (36.6 C) (01/06 1226) Temp Source: Oral (01/06 1226) BP: 96/76 (01/06 1400) Pulse Rate: 87 (01/06 1410)  Labs: Recent Labs    05/04/24 1749 05/05/24 0139  HGB 11.6*  --   HCT 36.5  --   PLT 382  --   LABPROT  --  57.7*  INR  --  6.2*  CREATININE 1.00  --     Estimated Creatinine Clearance: 39.7 mL/min (by C-G formula based on SCr of 1 mg/dL).   Medical History: Past Medical History:  Diagnosis Date   Anemia    past hx   Arthritis    hands and feet   Chronic kidney disease    COPD (chronic obstructive pulmonary disease) (HCC)    Dementia (HCC)    Dyspnea    Dysrhythmia    Pt reports slow HR and papatations   Heart murmur    past hx   Hypercholesteremia    Hypertension    Rheumatic fever    (x2) in past   Wears dentures    full upper    Medications:  PTA warfarin 4mg  daily (last dose 1/4)  Assessment: Roberta Ross is an 51yoF presenting with concerns for chronic nonhealing wound of right lower extremity. Past medical history notable for CKD 3a, gout, systolic CHF, dementia, hypertension, hyperlipidemia, DVT, A-fib on Coumadin , COPD, chronic kidney disease, and rheumatoid arthritis on prednisone  daily.  1/6 AM: Patient's INR 6.2, supratherapeutic. There are various potential explanations for the supratherapeutic level including age (>89yo), interaction with home allopurinol , interaction with recent clindamycin  prescription from 12/30, and the potential increased bleed risk with the chronic nonhealing wound.   Patient without symptoms and INR not due to a recent procedure. Per hospitalist, plan to hold off of oral vitamin K and check INR in the AM.  Hgb in line with historical baseline. Plt stable. Albumin wnl.  Date  Time    INR   Comment 1/6     0139   6.2     SUPRAtherapeutic   Goal of Therapy:  INR 2-3 Monitor platelets by anticoagulation protocol: Yes   Plan:  Ordered INR with AM labs Continuing to hold home warfarin Continue to monitor INR for resuming home warfarin  Leonor BROCKS Hardeep Reetz 05/05/2024,2:27 PM      [1]  Allergies Allergen Reactions   Metoprolol Shortness Of Breath   Penicillins Other (See Comments)    Internal swelling including throat TOLERATES CEFTRIAXONE    Methotrexate And Trimetrexate Nausea Only   Doxycycline Hyclate Rash

## 2024-05-05 NOTE — Assessment & Plan Note (Signed)
 At baseline

## 2024-05-05 NOTE — ED Notes (Signed)
 Patient called RN into room. More confused than earlier. Patient was unaware of all care that occurred today. RN re-oriented patient to situation.

## 2024-05-05 NOTE — Assessment & Plan Note (Signed)
 Continue diltiazem  infusion and wean as able Continue warfarin-INR 2 manage

## 2024-05-05 NOTE — Assessment & Plan Note (Deleted)
 Chronically immunosuppressed-on daily prednisone  therapy for RA Rocephin  and vancomycin  Wound care consult Follow-up tib-fib MRI Can consider surgical Ortho consult

## 2024-05-05 NOTE — Progress Notes (Signed)
 ED Pharmacy Antibiotic Sign Off An antibiotic consult was received from an ED provider for Vancomycin  per pharmacy dosing for sepsis. A chart review was completed to assess appropriateness.   The following one time order(s) were placed:  Vancomycin  1750 mg per pt wt: 72 kg  Further antibiotic and/or antibiotic pharmacy consults should be ordered by the admitting provider if indicated.   Thank you for allowing pharmacy to be a part of this patient's care.   Thank you, Rankin CANDIE Dills, PharmD, [facility] 05/05/2024 1:29 AM

## 2024-05-05 NOTE — Assessment & Plan Note (Signed)
Not acutely exacerbated ?Continue home inhalers with DuoNebs as needed ?

## 2024-05-05 NOTE — Assessment & Plan Note (Signed)
 On chronic steroids, so we will continue

## 2024-05-05 NOTE — ED Notes (Signed)
 Patient was changed. Patient was soiled with urine. Bedding was changed, New dress was put on per request of daughter. Patient was turned due to bottom being red and hurting patient.

## 2024-05-05 NOTE — ED Notes (Signed)
 Patient had to be fed breakfast. Patient was unable to hold utensils for food.

## 2024-05-05 NOTE — Assessment & Plan Note (Addendum)
 INR returned at 6.2 No active bleeding so we will hold off on vitamin K Pharmacy to manage

## 2024-05-05 NOTE — ED Provider Notes (Signed)
 "  [facility] Provider Note    Event Date/Time   First MD Initiated Contact with Patient 05/05/24 [phone]     (approximate)   History   Wound Infection   HPI  Roberta Ross is a 89 y.o. female with history of dementia, hypertension, hyperlipidemia, DVT, A-fib on Coumadin , COPD, chronic kidney disease, rheumatoid arthritis on prednisone  who presents to the emergency department with a wound to the right lower extremity.  States that she injured her leg at the end of October after she fell off the toilet.  She states she is mostly bedbound and lives at [facility] facility.  She states that over the past couple weeks it has been red hot and warm with foul-smelling drainage.  She has been on clindamycin  since 04/28/2024 without improvement.  Denies any fevers, vomiting.  No history of diabetes.  History provided by patient.    Past Medical History:  Diagnosis Date   Anemia    past hx   Arthritis    hands and feet   Chronic kidney disease    COPD (chronic obstructive pulmonary disease) (HCC)    Dementia (HCC)    Dyspnea    Dysrhythmia    Pt reports slow HR and papatations   Heart murmur    past hx   Hypercholesteremia    Hypertension    Rheumatic fever    (x2) in past   Wears dentures    full upper    Past Surgical History:  Procedure Laterality Date   ABDOMINAL HYSTERECTOMY     APPENDECTOMY     as a child   CATARACT EXTRACTION W/PHACO Left 01/10/2015   Procedure: CATARACT EXTRACTION PHACO AND INTRAOCULAR LENS PLACEMENT (IOC);  Surgeon: Donzell Arlyce Budd, MD;  Location: [facility];  Service: Ophthalmology;  Laterality: Left;   COMPLEX WOUND CLOSURE Left 11/15/2021   Procedure: COMPLEX WOUND CLOSURE;  Surgeon: Edda Mt, MD;  Location: [facility];  Service: ENT;  Laterality: Left;   EXCISION MASS HEAD Left 11/15/2021   Procedure: EXCISION MALIGNANT MASS EAR;  Surgeon: Edda Mt, MD;  Location: [facility];  Service:  ENT;  Laterality: Left;   INTRAMEDULLARY (IM) NAIL INTERTROCHANTERIC Left 03/28/2020   Procedure: INTRAMEDULLARY (IM) NAIL INTERTROCHANTRIC-Hip Fracture;  Surgeon: Tobie Priest, MD;  Location: [facility];  Service: Orthopedics;  Laterality: Left;   SHOULDER CLOSED REDUCTION Right 03/12/2024   Procedure: CLOSED REDUCTION, SHOULDER;  Surgeon: Edie Norleen PARAS, MD;  Location: [facility];  Service: Orthopedics;  Laterality: Right;   TONSILLECTOMY      MEDICATIONS:  Prior to Admission medications  Medication Sig Start Date End Date Taking? Authorizing Provider  acetaminophen  (TYLENOL ) 500 MG tablet Take 1 tablet (500 mg total) by mouth every 6 (six) hours as needed. 04/18/24 04/18/25  Clarine Ozell LABOR, MD  acetaminophen  (TYLENOL ) 650 MG CR tablet Take 650 mg by mouth 2 (two) times daily.    [provider]  albuterol  (VENTOLIN  HFA) 108 (90 Base) MCG/ACT inhaler Inhale 2 puffs into the lungs every 4 (four) hours as needed for wheezing or shortness of breath. 05/03/22   Sung, Jade J, MD  allopurinol  (ZYLOPRIM ) 100 MG tablet Take 0.5 tablets (50 mg total) by mouth daily. 02/14/24   Alexander, Natalie, DO  ammonium lactate (LAC-HYDRIN) 12 % lotion Apply 1 Application topically daily.    [provider]  diclofenac  Sodium (VOLTAREN ) 1 % GEL Apply 4 g topically 4 (four) times daily. Both knees    [provider]  diltiazem  (CARDIZEM  LA) 240 MG 24 hr tablet Take 240 mg by mouth daily. 02/04/23   [provider]  folic acid  (FOLVITE ) 400 MCG tablet Take 400 mcg by mouth in the morning.    [provider]  melatonin 5 MG TABS Take 1 tablet (5 mg total) by mouth at bedtime. 02/13/24   Alexander, Natalie, DO  oxyCODONE  (OXY IR/ROXICODONE ) 5 MG immediate release tablet Take 0.5-1 tablets (2.5-5 mg total) by mouth every 6 (six) hours as needed for moderate pain (pain score 4-6) or severe pain (pain score 7-10). 03/16/24   Zhang, Dekui, MD  polyethylene glycol (MIRALAX  / GLYCOLAX )  17 g packet Take 17 g by mouth daily.    [provider]  predniSONE  (DELTASONE ) 5 MG tablet Take 5 mg by mouth daily.    [provider]  sennosides-docusate sodium  (SENOKOT-S) 8.6-50 MG tablet Take 2 tablets by mouth 2 (two) times daily.    [provider]  sertraline  (ZOLOFT ) 50 MG tablet Take 50 mg by mouth daily. 02/03/24   [provider]  simvastatin  (ZOCOR ) 10 MG tablet Take 1 tablet (10 mg total) by mouth every evening. 01/25/22 03/12/24  Krishnan, Sendil K, MD  torsemide  (DEMADEX ) 20 MG tablet Take 1 tablet (20 mg total) by mouth every other day. 03/03/23   Patel, Sona, MD  Vitamin D, Ergocalciferol, (DRISDOL) 1.25 MG (50000 UNIT) CAPS capsule Take 50,000 Units by mouth once a week. 08/19/23   [provider]  warfarin (COUMADIN ) 2 MG tablet Take 2 tablets (4 mg total) by mouth daily. INR check 02/14/24 and further dose adjustments based on that level. Goal 2-3 Patient taking differently: Take 3 mg by mouth every evening. 02/13/24   Alexander, Natalie, DO  Zinc  Oxide 20 % PSTE Apply 1 application  topically 3 (three) times daily. Bottom/sacrum    [provider]    Physical Exam   Triage Vital Signs: ED Triage Vitals  Encounter Vitals Group     BP 05/04/24 1751 138/88     Girls Systolic BP Percentile --      Girls Diastolic BP Percentile --      Boys Systolic BP Percentile --      Boys Diastolic BP Percentile --      Pulse Rate 05/04/24 1751 73     Resp 05/04/24 1751 17     Temp 05/04/24 1751 (!) 97.5 F (36.4 C)     Temp Source 05/04/24 1751 Oral     SpO2 05/04/24 1751 96 %     Weight --      Height --      Head Circumference --      Peak Flow --      Pain Score 05/04/24 1747 4     Pain Loc --      Pain Education --      Exclude from Growth Chart --     Most recent vital signs: Vitals:   05/05/24 0500 05/05/24 0530  BP: 100/78 98/81  Pulse:  (!) 111  Resp: (!) 44 17  Temp:    SpO2:  98%    CONSTITUTIONAL:  Alert, responds appropriately to questions.  Elderly HEAD: Normocephalic, atraumatic EYES: Conjunctivae clear, pupils appear equal, sclera nonicteric ENT: normal nose; moist mucous membranes NECK: Supple, normal ROM CARD: Irregularly irregular and tachycardic; S1 and S2 appreciated RESP: Normal chest excursion without splinting or tachypnea; breath sounds clear and equal bilaterally; no wheezes, no rhonchi, no rales, no hypoxia or respiratory distress,  speaking full sentences ABD/GI: Non-distended; soft, non-tender, no rebound, no guarding, no peritoneal signs BACK: The back appears normal EXT: Large wound to the right lateral calf that involves skin, muscle with exposed bone.  2+ dopplerable DP and PT pulse on the right side.  Compartments soft.  Normal capillary refill and sensation.  There is increased redness and warmth noted to this leg to just below the knee.  Patient does have sequela of rheumatoid arthritis noted to her extremities. SKIN: Normal color for age and race; warm; no rash on exposed skin NEURO: Moves all extremities equally, normal speech PSYCH: The patient's mood and manner are appropriate.          Patient gave verbal permission to utilize photo for medical documentation only. The image was not stored on any personal device.   ED Results / Procedures / Treatments   LABS: (all labs ordered are listed, but only abnormal results are displayed) Labs Reviewed  CBC - Abnormal; Notable for the following components:      Result Value   WBC 10.7 (*)    RBC 3.83 (*)    Hemoglobin 11.6 (*)    All other components within normal limits  BASIC METABOLIC PANEL WITH GFR - Abnormal; Notable for the following components:   Glucose, Bld 131 (*)    Calcium 10.4 (*)    GFR, Estimated 54 (*)    All other components within normal limits  PROTIME-INR - Abnormal; Notable for the following components:   Prothrombin Time 57.7 (*)    INR 6.2 (*)    All other components within  normal limits  CULTURE, BLOOD (ROUTINE X 2)  CULTURE, BLOOD (ROUTINE X 2)  AEROBIC/ANAEROBIC CULTURE W GRAM STAIN (SURGICAL/DEEP WOUND)  LACTIC ACID, PLASMA  MAGNESIUM      EKG:  EKG Interpretation Date/Time:  Tuesday May 05 2024 01:45:21 EST Ventricular Rate:  125 PR Interval:    QRS Duration:  83 QT Interval:  351 QTC Calculation: 507 R Axis:   95  Text Interpretation: Right and left arm electrode reversal, interpretation assumes no reversal Atrial fibrillation Consider right ventricular hypertrophy Nonspecific T abnormalities, diffuse leads Prolonged QT interval Confirmed by Neomi Neptune [phone]) on 05/05/2024 2:46:24 AM         RADIOLOGY: My personal review and interpretation of imaging: MRI pending.  I have personally reviewed all radiology reports.   No results found.   PROCEDURES:  Critical Care performed: Yes, see critical care procedure note(s)   CRITICAL CARE Performed by: Neptune Neomi   Total critical care time: 30 minutes  Critical care time was exclusive of separately billable procedures and treating other patients.  Critical care was necessary to treat or prevent imminent or life-threatening deterioration.  Critical care was time spent personally by me on the following activities: development of treatment plan with patient and/or surrogate as well as nursing, discussions with consultants, evaluation of patient's response to treatment, examination of patient, obtaining history from patient or surrogate, ordering and performing treatments and interventions, ordering and review of laboratory studies, ordering and review of radiographic studies, pulse oximetry and re-evaluation of patient's condition.   SABRA1-3 Lead EKG Interpretation  Performed by: Haelie Clapp, Neptune SAILOR, DO Authorized by: Lotus Santillo, Neptune SAILOR, DO     Interpretation: abnormal     ECG rate:  125   ECG rate assessment: tachycardic     Rhythm: atrial fibrillation     Ectopy: none     Conduction:  normal  IMPRESSION / MDM / ASSESSMENT AND PLAN / ED COURSE  I reviewed the triage vital signs and the nursing notes.    Patient here with wound to the right lower extremity that is progressively worsening with signs of cellulitis and foul odor.  When I took off the bandage a large piece of necrotic tissue came off.  The patient is on the cardiac monitor to evaluate for evidence of arrhythmia and/or significant heart rate changes.   DIFFERENTIAL DIAGNOSIS (includes but not limited to):   Cellulitis, necrosis, concern for possible developing osteomyelitis given depth of wound and immunocompromised patient, sepsis, A-fib with RVR, anemia, electrolyte derangement   Patient's presentation is most consistent with acute presentation with potential threat to life or bodily function.   PLAN: Labs do show slight leukocytosis.  This and tachycardia meets sepsis criteria.  Lactic is normal.  Will obtain cultures of the wound and blood cultures and give broad-spectrum antibiotics.  Will obtain MRI with and without contrast for further evaluation and to rule out osteomyelitis.  Patient is in A-fib with RVR.  Slightly prolonged QT interval on EKG.  Will check electrolytes.  Will start IV diltiazem .  She is not having any chest pain or shortness of breath.   MEDICATIONS GIVEN IN ED: Medications  0.9 %  sodium chloride  infusion ( Intravenous New Bag/Given 05/05/24 0148)  diltiazem  (CARDIZEM ) 125 mg in dextrose  5% 125 mL (1 mg/mL) infusion (5 mg/hr Intravenous New Bag/Given 05/05/24 0416)  cefTRIAXone  (ROCEPHIN ) 1 g in sodium chloride  0.9 % 100 mL IVPB (has no administration in time range)  prochlorperazine  (COMPAZINE ) injection 10 mg (has no administration in time range)  allopurinol  (ZYLOPRIM ) tablet 50 mg (has no administration in time range)  oxyCODONE  (Oxy IR/ROXICODONE ) immediate release tablet 2.5-5 mg (has no administration in time range)  diltiazem  (CARDIZEM  LA) 24 hr tablet 240 mg (has no  administration in time range)  simvastatin  (ZOCOR ) tablet 10 mg (has no administration in time range)  sertraline  (ZOLOFT ) tablet 50 mg (has no administration in time range)  predniSONE  (DELTASONE ) tablet 5 mg (has no administration in time range)  polyethylene glycol (MIRALAX  / GLYCOLAX ) packet 17 g (has no administration in time range)  melatonin tablet 5 mg (has no administration in time range)  acetaminophen  (TYLENOL ) tablet 650 mg (has no administration in time range)    Or  acetaminophen  (TYLENOL ) suppository 650 mg (has no administration in time range)  vancomycin  (VANCOCIN ) IVPB 1000 mg/200 mL premix (has no administration in time range)  albuterol  (PROVENTIL ) (2.5 MG/3ML) 0.083% nebulizer solution 2.5 mg (has no administration in time range)  fentaNYL  (SUBLIMAZE ) injection 50 mcg (50 mcg Intravenous Given 05/05/24 0135)  ondansetron  (ZOFRAN ) injection 4 mg (4 mg Intravenous Given 05/05/24 0136)  ceFEPIme  (MAXIPIME ) 2 g in sodium chloride  0.9 % 100 mL IVPB (0 g Intravenous Stopped 05/05/24 0134)  vancomycin  (VANCOREADY) IVPB 1750 mg/350 mL (0 mg Intravenous Stopped 05/05/24 0445)  gadobutrol  (GADAVIST ) 1 MMOL/ML injection 7 mL (7 mLs Intravenous Contrast Given 05/05/24 0318)     ED COURSE: Patient's INR 6.2.  Not actively bleeding.  No indication for reversal.   CONSULTS:  Consulted and discussed patient's case with hospitalist, Dr. Cleatus.  I have recommended admission and consulting physician agrees and will place admission orders.  Patient (and family if present) agree with this plan.   I reviewed all nursing notes, vitals, pertinent previous records.  All labs, EKGs, imaging ordered have been independently reviewed and interpreted by myself.  OUTSIDE RECORDS REVIEWED: Reviewed admission notes and October and November 2025.       FINAL CLINICAL IMPRESSION(S) / ED DIAGNOSES   Final diagnoses:  Cellulitis of right lower extremity  Atrial fibrillation with RVR (HCC)  Prolonged  Q-T interval on ECG  Supratherapeutic INR  Open wound of right lower extremity, initial encounter     Rx / DC Orders   ED Discharge Orders     None        Note:  This document was prepared using Dragon voice recognition software and may include unintentional dictation errors.   Taquilla Downum, Josette SAILOR, DO 05/05/24 [phone]  "

## 2024-05-05 NOTE — Consult Note (Signed)
 " Hospital Consult    Reason for Consult:  Chronic wound right Lower extremity  Requesting Physician:  Dr Devaughn Ban MD  MRN #:  [id number]  History of Present Illness: This is a 89 y.o. female with a medical history significant for atrial fibrillation and on warfarin as anticoagulation.  She is also known to have chronic kidney disease stage IIIa, hypertension, systolic CHF, COPD and a history of a femoral DVT from 01/18/2024.  She is also noted to be on daily prednisone  for rheumatoid arthritis.  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  She is currently admitted for an infection of this chronic wound and was incidentally found to be in rapid A-fib during the emergency room workup.  Hospital admission was requested due to the facility in which she lives not being able to manage the wound to her leg.  Vascular surgery was consulted to evaluate.  Past Medical History:  Diagnosis Date   Anemia    past hx   Arthritis    hands and feet   Chronic kidney disease    COPD (chronic obstructive pulmonary disease) (HCC)    Dementia (HCC)    Dyspnea    Dysrhythmia    Pt reports slow HR and papatations   Heart murmur    past hx   Hypercholesteremia    Hypertension    Rheumatic fever    (x2) in past   Wears dentures    full upper    Past Surgical History:  Procedure Laterality Date   ABDOMINAL HYSTERECTOMY     APPENDECTOMY     as a child   CATARACT EXTRACTION W/PHACO Left 01/10/2015   Procedure: CATARACT EXTRACTION PHACO AND INTRAOCULAR LENS PLACEMENT (IOC);  Surgeon: Donzell Arlyce Budd, MD;  Location: [facility];  Service: Ophthalmology;  Laterality: Left;   COMPLEX WOUND CLOSURE Left 11/15/2021   Procedure: COMPLEX WOUND CLOSURE;  Surgeon: Edda Mt, MD;  Location: [facility];  Service: ENT;  Laterality: Left;   EXCISION MASS HEAD Left 11/15/2021   Procedure: EXCISION MALIGNANT MASS EAR;  Surgeon: Edda Mt,  MD;  Location: [facility];  Service: ENT;  Laterality: Left;   INTRAMEDULLARY (IM) NAIL INTERTROCHANTERIC Left 03/28/2020   Procedure: INTRAMEDULLARY (IM) NAIL INTERTROCHANTRIC-Hip Fracture;  Surgeon: Tobie Priest, MD;  Location: [facility];  Service: Orthopedics;  Laterality: Left;   SHOULDER CLOSED REDUCTION Right 03/12/2024   Procedure: CLOSED REDUCTION, SHOULDER;  Surgeon: Edie Norleen PARAS, MD;  Location: [facility];  Service: Orthopedics;  Laterality: Right;   TONSILLECTOMY      Allergies[1]  Prior to Admission medications  Medication Sig Start Date End Date Taking? Authorizing Provider  clindamycin  (CLEOCIN ) 300 MG capsule Take 300 mg by mouth 4 (four) times daily. 04/27/24  Yes [provider]  potassium chloride  (KLOR-CON ) 10 MEQ tablet Take 10 mEq by mouth daily. 04/27/24  Yes [provider]  acetaminophen  (TYLENOL ) 500 MG tablet Take 1 tablet (500 mg total) by mouth every 6 (six) hours as needed. 04/18/24 04/18/25  Clarine Ozell LABOR, MD  acetaminophen  (TYLENOL ) 650 MG CR tablet Take 650 mg by mouth 2 (two) times daily.    [provider]  albuterol  (VENTOLIN  HFA) 108 (90 Base) MCG/ACT inhaler Inhale 2 puffs into the lungs every 4 (four) hours as needed for wheezing or shortness of breath. 05/03/22   Sung, Jade J, MD  allopurinol  (ZYLOPRIM ) 100 MG tablet Take 0.5 tablets (50 mg total) by  mouth daily. 02/14/24   Alexander, Natalie, DO  ammonium lactate (LAC-HYDRIN) 12 % lotion Apply 1 Application topically daily.    [provider]  diclofenac  Sodium (VOLTAREN ) 1 % GEL Apply 4 g topically 4 (four) times daily. Both knees    [provider]  diltiazem  (CARDIZEM  LA) 240 MG 24 hr tablet Take 240 mg by mouth daily. 02/04/23   [provider]  folic acid  (FOLVITE ) 400 MCG tablet Take 400 mcg by mouth in the morning.    [provider]  melatonin 5 MG TABS Take 1 tablet (5 mg total) by mouth at bedtime. 02/13/24   Alexander, Natalie, DO   oxyCODONE  (OXY IR/ROXICODONE ) 5 MG immediate release tablet Take 0.5-1 tablets (2.5-5 mg total) by mouth every 6 (six) hours as needed for moderate pain (pain score 4-6) or severe pain (pain score 7-10). 03/16/24   Zhang, Dekui, MD  polyethylene glycol (MIRALAX  / GLYCOLAX ) 17 g packet Take 17 g by mouth daily.    [provider]  predniSONE  (DELTASONE ) 5 MG tablet Take 5 mg by mouth daily.    [provider]  sennosides-docusate sodium  (SENOKOT-S) 8.6-50 MG tablet Take 2 tablets by mouth 2 (two) times daily.    [provider]  sertraline  (ZOLOFT ) 50 MG tablet Take 50 mg by mouth daily. 02/03/24   [provider]  simvastatin  (ZOCOR ) 10 MG tablet Take 1 tablet (10 mg total) by mouth every evening. 01/25/22 03/12/24  Krishnan, Sendil K, MD  torsemide  (DEMADEX ) 20 MG tablet Take 1 tablet (20 mg total) by mouth every other day. 03/03/23   Patel, Sona, MD  Vitamin D, Ergocalciferol, (DRISDOL) 1.25 MG (50000 UNIT) CAPS capsule Take 50,000 Units by mouth once a week. 08/19/23   [provider]  warfarin (COUMADIN ) 2 MG tablet Take 2 tablets (4 mg total) by mouth daily. INR check 02/14/24 and further dose adjustments based on that level. Goal 2-3 Patient taking differently: Take 3 mg by mouth every evening. 02/13/24   Alexander, Natalie, DO  warfarin (COUMADIN ) 4 MG tablet Take 4 mg by mouth daily.    [provider]  Zinc  Oxide 20 % PSTE Apply 1 application  topically 3 (three) times daily. Bottom/sacrum    [provider]    Social History   Socioeconomic History   Marital status: Single    Spouse name: Not on file   Number of children: Not on file   Years of education: Not on file   Highest education level: Not on file  Occupational History   Not on file  Tobacco Use   Smoking status: Former    Current packs/day: 0.00    Average packs/day: 0.3 packs/day for 60.0 years (15.0 ttl pk-yrs)    Types: Cigarettes    Start date: 89     Quit date: 2020    Years since quitting: 6.0   Smokeless tobacco: Never  Vaping Use   Vaping status: Never Used  Substance and Sexual Activity   Alcohol use: No   Drug use: Not Currently   Sexual activity: Not on file  Other Topics Concern   Not on file  Social History Narrative   Not on file   Social Drivers of Health   Tobacco Use: Medium Risk (05/04/2024)   Patient History    Smoking Tobacco Use: Former    Smokeless Tobacco Use: Never    Passive Exposure: Not on Actuary Strain: Not on file  Food Insecurity: No Food  Insecurity (03/13/2024)   Epic    Worried About Programme Researcher, Broadcasting/film/video in the Last Year: Never true    Ran Out of Food in the Last Year: Never true  Transportation Needs: No Transportation Needs (03/13/2024)   Epic    Lack of Transportation (Medical): No    Lack of Transportation (Non-Medical): No  Physical Activity: Not on file  Stress: Not on file  Social Connections: Unknown (03/13/2024)   Social Connection and Isolation Panel    Frequency of Communication with Friends and Family: More than three times a week    Frequency of Social Gatherings with Friends and Family: More than three times a week    Attends Religious Services: Not on file    Active Member of Clubs or Organizations: No    Attends Banker Meetings: Never    Marital Status: Widowed  Recent Concern: Social Connections - Socially Isolated (03/13/2024)   Social Connection and Isolation Panel    Frequency of Communication with Friends and Family: More than three times a week    Frequency of Social Gatherings with Friends and Family: More than three times a week    Attends Religious Services: Never    Database Administrator or Organizations: No    Attends Banker Meetings: Not on file    Marital Status: Widowed  Intimate Partner Violence: Not At Risk (03/13/2024)   Epic    Fear of Current or Ex-Partner: No    Emotionally Abused: No    Physically Abused:  No    Sexually Abused: No  Depression (PHQ2-9): Not on file  Alcohol Screen: Not on file  Housing: Low Risk (03/13/2024)   Epic    Unable to Pay for Housing in the Last Year: No    Number of Times Moved in the Last Year: 0    Homeless in the Last Year: No  Utilities: Not At Risk (03/13/2024)   Epic    Threatened with loss of utilities: No  Health Literacy: Not on file     Family History  Problem Relation Age of Onset   Cervical cancer Mother    Heart attack Father    Prostate cancer Father    Asthma Brother     ROS: Otherwise negative unless mentioned in HPI  Physical Examination  Vitals:   05/05/24 0840 05/05/24 0936  BP: 112/73 112/73  Pulse: 93   Resp: 20   Temp:    SpO2: 100%    Body mass index is 25.82 kg/m.  General:  WDWN in NAD Gait: Not observed HENT: WNL, normocephalic Pulmonary: normal non-labored breathing, without Rales, rhonchi,  wheezing Cardiac: irregular, Hx of Atrial fibrillation, without  Murmurs, rubs or gallops; without carotid bruits Abdomen: Positive bowel sounds throughout, soft, NT/ND, no masses Skin: without rashes Positive wound to right lower extremity with skin tear and hematoma.  Vascular Exam/Pulses: All extremities are warm to the touch with palpable pulses.  Extremities: without ischemic changes, without Gangrene , without cellulitis; with open wounds;  Musculoskeletal: no muscle wasting or atrophy  Neurologic: A&O X 3;  No focal weakness or paresthesias are detected; speech is fluent/normal Psychiatric:  The pt has Normal affect. Lymph:  Unremarkable  CBC    Component Value Date/Time   WBC 10.7 (H) 05/04/2024 1749   RBC 3.83 (L) 05/04/2024 1749   HGB 11.6 (L) 05/04/2024 1749   HCT 36.5 05/04/2024 1749   PLT 382 05/04/2024 1749   MCV 95.3 05/04/2024 1749  MCH 30.3 05/04/2024 1749   MCHC 31.8 05/04/2024 1749   RDW 13.5 05/04/2024 1749   LYMPHSABS 1.0 04/18/2024 0901   MONOABS 0.5 04/18/2024 0901   EOSABS 0.1  04/18/2024 0901   BASOSABS 0.0 04/18/2024 0901    BMET    Component Value Date/Time   NA 139 05/04/2024 1749   K 3.8 05/04/2024 1749   CL 100 05/04/2024 1749   CO2 29 05/04/2024 1749   GLUCOSE 131 (H) 05/04/2024 1749   BUN 20 05/04/2024 1749   CREATININE 1.00 05/04/2024 1749   CALCIUM 10.4 (H) 05/04/2024 1749   GFRNONAA 54 (L) 05/04/2024 1749    COAGS: Lab Results  Component Value Date   INR 6.2 (HH) 05/05/2024   INR 2.2 (H) 03/16/2024   INR 2.3 (H) 03/15/2024     Non-Invasive Vascular Imaging:   EXAM:05/05/2024 MRI OF LOWER RIGHT EXTREMITY WITHOUT AND WITH CONTRAST   TECHNIQUE: Multiplanar, multisequence MR imaging of the right leg was performed both before and after administration of intravenous contrast.   CONTRAST:  7mL GADAVIST  GADOBUTROL  1 MMOL/ML IV SOLN   COMPARISON:  None available.   FINDINGS: Bones/Joint/Cartilage   No evidence of acute osteomyelitis. Large field-of-view coronal sequences demonstrate subchondral curvilinear low signal abnormality of the right medial tibial plateau with surrounding edema like signal, suspicious for a subchondral insufficiency fracture (series 4, image 14).   Muscles and Tendons   Generalized atrophy of the anterior and posterior compartment musculature of the calf with patchy nonenhancing increased T2 signal intensity, likely reflecting sequela of denervation changes. No intramuscular collection. Edema and enhancement of the investing fascia overlying the mid to distal peroneal musculature and lateral soleus muscle.   Soft tissue Wound extending along the mid to distal lateral calf with complex peripherally enhancing subcutaneous fluid collection demonstrating heterogenous T2 hyperintense and T1 isointense signal with areas of mixed T1 hyperintensity extending to the underlying investing fascia of the lateral anterior and posterior compartment. This collection measures up to 7.2 cm TR x 1 cm AP x 10 cm CC. This  may represent an infected hematoma given the history of prior trauma. There is surrounding cutaneous thickening and enhancement. Diffuse subcutaneous edema extending circumferentially throughout the calf.   IMPRESSION: 1. Wound along the mid to distal lateral calf with complex peripherally enhancing subcutaneous fluid collection extending to the underlying investing fascia of the lateral anterior and posterior compartment with associated enhancement. This collection measures up to 7.2 cm TR x 1 cm AP x 10 cm CC and may represent an infected hematoma given the history of prior trauma. Surrounding cutaneous thickening and enhancement with diffuse subcutaneous edema extending throughout the calf, cellulitis is not excluded. 2. No evidence of acute osteomyelitis. 3. Findings suspicious for subchondral insufficiency fracture of the right medial tibial plateau with surrounding marrow edema noted on limited large field-of-view coronal sequences. Recommend correlation with history/exam. Dedicated MRI of the knee could be considered for further evaluation     Electronically Signed   By: Harrietta Sherry M.D.   On: 05/05/2024 09:09  Statin:  No. Beta Blocker:  No. Aspirin :  No. ACEI:  No. ARB:  No. CCB use:  Yes Other antiplatelets/anticoagulants:  Yes.   Warfarin 4mg  daily    ASSESSMENT/PLAN: This is a 89 y.o. female with a medical history significant for atrial fibrillation warfarin anticoagulation.  Back in October 2025 she suffered trauma to her right lower extremity which created a wound along the mid to distal lateral calf.  She was admitted  to the hospital due to this complex wound and the fact that the facility she lives and was unable to care for this.  While here she was found to be in rapid A-fib, RVR in the 130s.  She was started on cefepime  and vancomycin  for the wound infection as well as Cardizem  infusion for atrial fibrillation with RVR.  She underwent an MRI of her right  lower extremity which showed a collection of fluid underlying the investing fascia of the lateral anterior posterior compartment.  This collection measures up to 7.2 cm x 1 cm x 10 cm which may represent an infected hematoma given the history of prior trauma.  She also has subcutaneous edema extending through the cath.  There was no evidence of acute osteomyelitis.  Findings were also suspicious for knee fracture.  Radiology recommends dedicated MRI of the extremity considered for further evaluation.  Dr. Selinda Gu MD and myself reviewed the scans.  At this time vascular surgery recommends Unna boot wrappings to the right lower extremity to help with compression of this compartment area to help reduce some swelling to her leg.  An operative debriding would not help at this time.  Unna boot wraps for couple weeks to her lower extremity would help due to the ability to compress and dissipate any residual hematoma that is in her leg.  We recommend continuing IV antibiotics for concerns of any type of infection.   -Dr. Selinda Gu MD and myself reviewed the scans and agree with the above-stated plan.   Gwendlyn JONELLE [facility]ank [facility] 05/05/2024 9:45 AM     [1]  Allergies Allergen Reactions   Metoprolol Shortness Of Breath   Penicillins Other (See Comments)    Internal swelling including throat TOLERATES CEFTRIAXONE    Methotrexate And Trimetrexate Nausea Only   Doxycycline Hyclate Rash   "

## 2024-05-05 NOTE — Assessment & Plan Note (Addendum)
 Infected hematoma of chronic wound / supratherapeutic INR Chronically immunosuppressed-on daily prednisone  therapy for RA History of trauma to right lower extremity a few months prior, wound not healing Rocephin  and vancomycin  Wound care consult Follow-up tib-fib MRI Can consider surgical Ortho consult

## 2024-05-05 NOTE — H&P (Addendum)
 " History and Physical    Patient: Roberta Ross FMW:[id number] DOB: Mar 07, 1934 DOA: 05/05/2024 DOS: the patient was seen and examined on 05/05/2024 PCP: Lenon Layman ORN, MD  Patient coming from: SNF  Chief Complaint:  Chief Complaint  Patient presents with   Wound Infection    HPI: Roberta Ross is a 89 y.o. female with medical history significant for A-fib on warfarin, CKD stage IIIa, gout, HTN, systolic CHF, COPD, femoral DVT 12/2023 on warfarin rheumatoid arthritis on daily prednisone  with a chronic non healing wound right lower leg since an injury back in October, being admitted with concerns for an infected of the chronic wound , incidentally found to be in rapid A-fib during ED workup.  The facility noticed foul odor from the wound and started her on clindamycin  about a week ago without improvement so she was sent to the ED for admission due to inability to manage the wound at the facility. In the ED, vitals were initially within normal limits but then she went into RVR in the 130s with a drop in her O2 sat to 88%.  She was started on a Dilt infusion and placed on O2 at 2 L. Labs showed elevated WBC of 10.7 with lactic acid 1.9 but was otherwise unremarkable. EKG showed A-fib at 125 MRI tib-fib pending  Patient started on cefepime  and vancomycin  for wound infection Started on Cardizem  infusion for A-fib with RVR  Admission requested     Past Medical History:  Diagnosis Date   Anemia    past hx   Arthritis    hands and feet   Chronic kidney disease    COPD (chronic obstructive pulmonary disease) (HCC)    Dementia (HCC)    Dyspnea    Dysrhythmia    Pt reports slow HR and papatations   Heart murmur    past hx   Hypercholesteremia    Hypertension    Rheumatic fever    (x2) in past   Wears dentures    full upper   Past Surgical History:  Procedure Laterality Date   ABDOMINAL HYSTERECTOMY     APPENDECTOMY     as a child   CATARACT EXTRACTION  W/PHACO Left 01/10/2015   Procedure: CATARACT EXTRACTION PHACO AND INTRAOCULAR LENS PLACEMENT (IOC);  Surgeon: Donzell Arlyce Budd, MD;  Location: [facility];  Service: Ophthalmology;  Laterality: Left;   COMPLEX WOUND CLOSURE Left 11/15/2021   Procedure: COMPLEX WOUND CLOSURE;  Surgeon: Edda Mt, MD;  Location: [facility];  Service: ENT;  Laterality: Left;   EXCISION MASS HEAD Left 11/15/2021   Procedure: EXCISION MALIGNANT MASS EAR;  Surgeon: Edda Mt, MD;  Location: [facility];  Service: ENT;  Laterality: Left;   INTRAMEDULLARY (IM) NAIL INTERTROCHANTERIC Left 03/28/2020   Procedure: INTRAMEDULLARY (IM) NAIL INTERTROCHANTRIC-Hip Fracture;  Surgeon: Tobie Priest, MD;  Location: [facility];  Service: Orthopedics;  Laterality: Left;   SHOULDER CLOSED REDUCTION Right 03/12/2024   Procedure: CLOSED REDUCTION, SHOULDER;  Surgeon: Edie Norleen PARAS, MD;  Location: [facility];  Service: Orthopedics;  Laterality: Right;   TONSILLECTOMY     Social History:  reports that she quit smoking about 6 years ago. Her smoking use included cigarettes. She started smoking about 66 years ago. She has a 15 pack-year smoking history. She has never used smokeless tobacco. She reports that she does not currently use drugs. She reports that she does not drink alcohol.  Allergies[1]  Family History  Problem Relation Age of Onset   Cervical  cancer Mother    Heart attack Father    Prostate cancer Father    Asthma Brother     Prior to Admission medications  Medication Sig Start Date End Date Taking? Authorizing Provider  acetaminophen  (TYLENOL ) 500 MG tablet Take 1 tablet (500 mg total) by mouth every 6 (six) hours as needed. 04/18/24 04/18/25  Clarine Ozell LABOR, MD  acetaminophen  (TYLENOL ) 650 MG CR tablet Take 650 mg by mouth 2 (two) times daily.    [provider]  albuterol  (VENTOLIN  HFA) 108 (90 Base) MCG/ACT inhaler Inhale 2 puffs into the lungs every 4 (four) hours as needed for wheezing or  shortness of breath. 05/03/22   Sung, Jade J, MD  allopurinol  (ZYLOPRIM ) 100 MG tablet Take 0.5 tablets (50 mg total) by mouth daily. 02/14/24   Alexander, Natalie, DO  ammonium lactate (LAC-HYDRIN) 12 % lotion Apply 1 Application topically daily.    [provider]  diclofenac  Sodium (VOLTAREN ) 1 % GEL Apply 4 g topically 4 (four) times daily. Both knees    [provider]  diltiazem  (CARDIZEM  LA) 240 MG 24 hr tablet Take 240 mg by mouth daily. 02/04/23   [provider]  folic acid  (FOLVITE ) 400 MCG tablet Take 400 mcg by mouth in the morning.    [provider]  melatonin 5 MG TABS Take 1 tablet (5 mg total) by mouth at bedtime. 02/13/24   Alexander, Natalie, DO  oxyCODONE  (OXY IR/ROXICODONE ) 5 MG immediate release tablet Take 0.5-1 tablets (2.5-5 mg total) by mouth every 6 (six) hours as needed for moderate pain (pain score 4-6) or severe pain (pain score 7-10). 03/16/24   Zhang, Dekui, MD  polyethylene glycol (MIRALAX  / GLYCOLAX ) 17 g packet Take 17 g by mouth daily.    [provider]  predniSONE  (DELTASONE ) 5 MG tablet Take 5 mg by mouth daily.    [provider]  sennosides-docusate sodium  (SENOKOT-S) 8.6-50 MG tablet Take 2 tablets by mouth 2 (two) times daily.    [provider]  sertraline  (ZOLOFT ) 50 MG tablet Take 50 mg by mouth daily. 02/03/24   [provider]  simvastatin  (ZOCOR ) 10 MG tablet Take 1 tablet (10 mg total) by mouth every evening. 01/25/22 03/12/24  Krishnan, Sendil K, MD  torsemide  (DEMADEX ) 20 MG tablet Take 1 tablet (20 mg total) by mouth every other day. 03/03/23   Patel, Sona, MD  Vitamin D, Ergocalciferol, (DRISDOL) 1.25 MG (50000 UNIT) CAPS capsule Take 50,000 Units by mouth once a week. 08/19/23   [provider]  warfarin (COUMADIN ) 2 MG tablet Take 2 tablets (4 mg total) by mouth daily. INR check 02/14/24 and further dose adjustments based on that level. Goal 2-3 Patient taking  differently: Take 3 mg by mouth every evening. 02/13/24   Alexander, Natalie, DO  Zinc  Oxide 20 % PSTE Apply 1 application  topically 3 (three) times daily. Bottom/sacrum    [provider]    Physical Exam: Vitals:   05/05/24 0045 05/05/24 0127 05/05/24 0148 05/05/24 0150  BP:      Pulse:      Resp:      Temp: 98 F (36.7 C)     TempSrc: Oral     SpO2:   (!) 88% 99%  Weight:  72.6 kg     Physical Exam Vitals and nursing note reviewed.  Constitutional:      General: She is not in acute distress. HENT:     Head: Normocephalic and atraumatic.  Cardiovascular:     Rate and Rhythm: Tachycardia present. Rhythm regularly irregular.     Heart sounds: Normal heart sounds.  Pulmonary:     Effort: Pulmonary effort is normal.     Breath sounds: Normal breath sounds.  Abdominal:     Palpations: Abdomen is soft.     Tenderness: There is no abdominal tenderness.  Musculoskeletal:     Comments: See picture below  Neurological:     Mental Status: Mental status is at baseline.        Labs on Admission: I have personally reviewed following labs and imaging studies  CBC: Recent Labs  Lab 05/04/24 1749  WBC 10.7*  HGB 11.6*  HCT 36.5  MCV 95.3  PLT 382   Basic Metabolic Panel: Recent Labs  Lab 05/04/24 1749  NA 139  K 3.8  CL 100  CO2 29  GLUCOSE 131*  BUN 20  CREATININE 1.00  CALCIUM 10.4*   GFR: Estimated Creatinine Clearance: 39.7 mL/min (by C-G formula based on SCr of 1 mg/dL). Liver Function Tests: No results for input(s): AST, ALT, ALKPHOS, BILITOT, PROT, ALBUMIN in the last 168 hours. No results for input(s): LIPASE, AMYLASE in the last 168 hours. No results for input(s): AMMONIA in the last 168 hours. Coagulation Profile: No results for input(s): INR, PROTIME in the last 168 hours. Cardiac Enzymes: No results for input(s): CKTOTAL, CKMB, CKMBINDEX, TROPONINI in the last 168 hours. BNP (last 3 results) No results  for input(s): PROBNP in the last 8760 hours. HbA1C: No results for input(s): HGBA1C in the last 72 hours. CBG: No results for input(s): GLUCAP in the last 168 hours. Lipid Profile: No results for input(s): CHOL, HDL, LDLCALC, TRIG, CHOLHDL, LDLDIRECT in the last 72 hours. Thyroid  Function Tests: No results for input(s): TSH, T4TOTAL, FREET4, T3FREE, THYROIDAB in the last 72 hours. Anemia Panel: No results for input(s): VITAMINB12, FOLATE, FERRITIN, TIBC, IRON, RETICCTPCT in the last 72 hours. Urine analysis:    Component Value Date/Time   COLORURINE YELLOW (A) 04/18/2024 1313   APPEARANCEUR CLEAR (A) 04/18/2024 1313   LABSPEC 1.014 04/18/2024 1313   PHURINE 6.0 04/18/2024 1313   GLUCOSEU NEGATIVE 04/18/2024 1313   HGBUR NEGATIVE 04/18/2024 1313   BILIRUBINUR NEGATIVE 04/18/2024 1313   KETONESUR NEGATIVE 04/18/2024 1313   PROTEINUR NEGATIVE 04/18/2024 1313   NITRITE NEGATIVE 04/18/2024 1313   LEUKOCYTESUR TRACE (A) 04/18/2024 1313    Radiological Exams on Admission: No results found. Data Reviewed for HPI: Relevant notes from primary care and specialist visits, past discharge summaries as available in EHR, including Care Everywhere. Prior diagnostic testing as pertinent to current admission diagnoses Updated medications and problem lists for reconciliation ED course, including vitals, labs, imaging, treatment and response to treatment Triage notes, nursing and pharmacy notes and ED provider's notes Notable results as noted above in HPI      Assessment and Plan: * Chronic nonhealing wound right lower extremity Infected hematoma of chronic wound / supratherapeutic INR Chronically immunosuppressed-on daily prednisone  therapy for RA History of trauma to right lower extremity a few months prior, wound not healing Rocephin  and vancomycin  Wound care consult Follow-up tib-fib MRI Can consider surgical Ortho consult  Supratherapeutic  INR INR returned at 6.2 No active bleeding so we will hold off on vitamin K Pharmacy to manage  Paroxysmal atrial fibrillation with RVR (HCC) Continue diltiazem  infusion and wean as able Continue warfarin-INR 2 manage  Chronic obstructive pulmonary disease (COPD) (HCC) Not acutely exacerbated Continue home inhalers with DuoNebs  as needed  Stage 3a chronic kidney disease (HCC) At baseline  Gout Not acutely flared  Rheumatoid arthritis (HCC) On chronic steroids, so we will continue        DVT prophylaxis: Coumadin   Consults: none  Advance Care Planning:   Code Status: Prior   Family Communication: none  Disposition Plan: Back to previous home environment  Severity of Illness: The appropriate patient status for this patient is INPATIENT. Inpatient status is judged to be reasonable and necessary in order to provide the required intensity of service to ensure the patient's safety. The patient's presenting symptoms, physical exam findings, and initial radiographic and laboratory data in the context of their chronic comorbidities is felt to place them at high risk for further clinical deterioration. Furthermore, it is not anticipated that the patient will be medically stable for discharge from the hospital within 2 midnights of admission.   * I certify that at the point of admission it is my clinical judgment that the patient will require inpatient hospital care spanning beyond 2 midnights from the point of admission due to high intensity of service, high risk for further deterioration and high frequency of surveillance required.*  Author: Delayne LULLA Solian, MD 05/05/2024 3:13 AM  For on call review [email or web address].      [1]  Allergies Allergen Reactions   Metoprolol Shortness Of Breath   Penicillins Other (See Comments)    Internal swelling including throat TOLERATES CEFTRIAXONE    Methotrexate And Trimetrexate Nausea Only   Doxycycline Hyclate Rash   "

## 2024-05-05 NOTE — Progress Notes (Signed)
 Pharmacy Antibiotic Note  Roberta Ross is a 89 y.o. female admitted on 05/05/2024 with wound infection.  Pharmacy has been consulted for Vancomycin  dosing.  Plan: Pt given Vancomycin  1750 mg once. Vancomycin  1000 mg IV Q 24 hrs. Goal AUC 400-550. Expected AUC: 552.7 SCr used: 1.0 Follow up culture results to assess for antibiotic optimization. Monitor renal function to assess for any necessary antibiotic dosing changes. Pharmacy will continue to follow and will adjust abx dosing whenever warranted.  Temp (24hrs), Avg:97.8 F (36.6 C), Min:97.5 F (36.4 C), Max:98 F (36.7 C)   Recent Labs  Lab 05/04/24 1749  WBC 10.7*  CREATININE 1.00  LATICACIDVEN 1.9    Estimated Creatinine Clearance: 39.7 mL/min (by C-G formula based on SCr of 1 mg/dL).    Allergies[1]  Antimicrobials this admission: 01/06 Cefepime  >> x 1 dose 01/06 Ceftriaxone  >> 01/07 Vancomycin  >>   Microbiology results: 01/06 BCx: Pending 01/06 WoundCx: Pending  Thank you for allowing pharmacy to be a part of this patients care.  Rankin CANDIE Dills, PharmD, [facility] 05/05/2024 3:55 AM     [1]  Allergies Allergen Reactions   Metoprolol Shortness Of Breath   Penicillins Other (See Comments)    Internal swelling including throat TOLERATES CEFTRIAXONE    Methotrexate And Trimetrexate Nausea Only   Doxycycline Hyclate Rash

## 2024-05-05 NOTE — ED Notes (Signed)
 Non-stick dressing applied to lower right leg. Wrapped with Gauze Fluff roll.

## 2024-05-05 NOTE — Progress Notes (Signed)
 Interim progress note - not for billing.  I have seen and examined the patient, reviewed the chart, and I agree with the assessment and plan outlined in the history and physical unless stipulated otherwise.   Here with wound right leg. Present since mid December, suffered wound on that leg during transport.   Brought in for worsening of that wound with report of fever at facility, worsening swelling.  Comes from assisted living. Appears very weak. Will order PT consult.  For patient's chronic wound will ask vascular to evaluate, may need debridement. Possible cellulitis vs edema/erythema from known DVT in that leg. Will continue vancomycin , d/c ceftriaxone . Superficial cultures pending. MRI also pending. Wound care consulted.   For report of fever, no other localizing symptoms but will order covid/flu.  Found to be in a-fib with mild rvr on presentation. Started on dilt gtt but hypotensive with that, will d/c, continue gentle fluids, resume home oral dilt  INR is supratherapeutic. No bleeding. Pharmacy consulted for dosing, coumadin  held today. She is on coumadin  because of recent DVT despite apixaban , thought to be apixaban  failure.   Note recent closed reduction for recurrent right shoulder dislocation likely 2/2 chronic rotator cuff tear. Follows w/ orthopedics outpt.  Other chronic problems appear stable. Med rec is pending.

## 2024-05-05 NOTE — Progress Notes (Signed)
 CODE SEPSIS - PHARMACY COMMUNICATION  **Broad Spectrum Antibiotics should be administered within 1 hour of Sepsis diagnosis**  Time Code Sepsis Called/Page Received: 0125  Antibiotics Ordered: Cefepime  & Vancomycin   Time of 1st antibiotic administration: 0133  Rankin CANDIE Dills, PharmD, MBA 05/05/2024 1:25 AM

## 2024-05-05 NOTE — Assessment & Plan Note (Signed)
Not acutely flared 

## 2024-05-05 NOTE — ED Notes (Signed)
 Messaged MD about patients diltiazem  drip. Patients HR 100-115. BP low in 80s systolic.

## 2024-05-06 ENCOUNTER — Inpatient Hospital Stay

## 2024-05-06 ENCOUNTER — Encounter (INDEPENDENT_AMBULATORY_CARE_PROVIDER_SITE_OTHER)

## 2024-05-06 ENCOUNTER — Ambulatory Visit (INDEPENDENT_AMBULATORY_CARE_PROVIDER_SITE_OTHER): Admitting: Vascular Surgery

## 2024-05-06 ENCOUNTER — Telehealth: Payer: Self-pay

## 2024-05-06 DIAGNOSIS — L089 Local infection of the skin and subcutaneous tissue, unspecified: Secondary | ICD-10-CM | POA: Diagnosis not present

## 2024-05-06 DIAGNOSIS — L98493 Non-pressure chronic ulcer of skin of other sites with necrosis of muscle: Secondary | ICD-10-CM | POA: Diagnosis not present

## 2024-05-06 LAB — BASIC METABOLIC PANEL WITH GFR
Anion gap: 8 (ref 5–15)
BUN: 15 mg/dL (ref 8–23)
CO2: 26 mmol/L (ref 22–32)
Calcium: 9.3 mg/dL (ref 8.9–10.3)
Chloride: 106 mmol/L (ref 98–111)
Creatinine, Ser: 0.97 mg/dL (ref 0.44–1.00)
GFR, Estimated: 56 mL/min — ABNORMAL LOW
Glucose, Bld: 92 mg/dL (ref 70–99)
Potassium: 3.6 mmol/L (ref 3.5–5.1)
Sodium: 140 mmol/L (ref 135–145)

## 2024-05-06 LAB — CBC
HCT: 28.8 % — ABNORMAL LOW (ref 36.0–46.0)
Hemoglobin: 8.9 g/dL — ABNORMAL LOW (ref 12.0–15.0)
MCH: 30.3 pg (ref 26.0–34.0)
MCHC: 30.9 g/dL (ref 30.0–36.0)
MCV: 98 fL (ref 80.0–100.0)
Platelets: 291 K/uL (ref 150–400)
RBC: 2.94 MIL/uL — ABNORMAL LOW (ref 3.87–5.11)
RDW: 13.7 % (ref 11.5–15.5)
WBC: 6.6 K/uL (ref 4.0–10.5)
nRBC: 0 % (ref 0.0–0.2)

## 2024-05-06 LAB — MRSA NEXT GEN BY PCR, NASAL: MRSA by PCR Next Gen: NOT DETECTED

## 2024-05-06 LAB — PROTIME-INR
INR: 4.8 (ref 0.8–1.2)
Prothrombin Time: 47.1 s — ABNORMAL HIGH (ref 11.4–15.2)

## 2024-05-06 MED ORDER — ENSURE PLUS HIGH PROTEIN PO LIQD
237.0000 mL | Freq: Two times a day (BID) | ORAL | Status: DC
  Administered 2024-05-06 – 2024-05-11 (×11): 237 mL via ORAL

## 2024-05-06 MED ORDER — ORAL CARE MOUTH RINSE: 15.0000 mL | OROMUCOSAL | Status: DC | PRN

## 2024-05-06 MED ORDER — SODIUM CHLORIDE 0.9 % IV SOLN
2.0000 g | INTRAVENOUS | Status: DC
  Administered 2024-05-06 – 2024-05-10 (×5): 2 g via INTRAVENOUS
  Filled 2024-05-06 (×7): qty 20

## 2024-05-06 NOTE — Telephone Encounter (Signed)
 Author contacting DTR for updates on mobility over past few weeks, after Left shoulder reduction, and then after pt was cleared to use LUE without restriction. DTR reports pt has not been able to return to [facility] since Nov admission, DTR cites falls anxiety as primary reasoning. Pt has been performing step transfers to [facility] and mobilizing in [facility] short distances in facility for ADL. DTR reports HHPT was ordered and came out, but did not initiate services after c/o pain and reported plan to return in 4 weeks (end of Jan).   10:47 AM, 05/06/2024 Peggye JAYSON Linear, PT, DPT Physical Therapist - [facility]  [phone] [facility])

## 2024-05-06 NOTE — Progress Notes (Signed)
 Pt off the floor for MRI

## 2024-05-06 NOTE — Progress Notes (Signed)
 Patient had 2 second cardiac pause. MD aware.

## 2024-05-06 NOTE — Consult Note (Signed)
 PHARMACY - ANTICOAGULATION CONSULT NOTE  Pharmacy Consult for warfarin dosing Indication: atrial fibrillation  Allergies[1]  Patient Measurements: Weight: 72.6 kg (160 lb) (From O/V note on 04/18/24)  Vital Signs: Temp: 98 F (36.7 C) (01/07 0324) Temp Source: Oral (01/06 2312) BP: 117/90 (01/07 0400) Pulse Rate: 60 (01/07 0400)  Labs: Recent Labs    05/04/24 1749 05/05/24 0139 05/06/24 0435  HGB 11.6*  --  8.9*  HCT 36.5  --  28.8*  PLT 382  --  291  LABPROT  --  57.7* 47.1*  INR  --  6.2* 4.8*  CREATININE 1.00  --  0.97    Estimated Creatinine Clearance: 40.9 mL/min (by C-G formula based on SCr of 0.97 mg/dL).   Medical History: Past Medical History:  Diagnosis Date   Anemia    past hx   Arthritis    hands and feet   Chronic kidney disease    COPD (chronic obstructive pulmonary disease) (HCC)    Dementia (HCC)    Dyspnea    Dysrhythmia    Pt reports slow HR and papatations   Heart murmur    past hx   Hypercholesteremia    Hypertension    Rheumatic fever    (x2) in past   Wears dentures    full upper    Medications:  PTA warfarin 4mg  daily (last dose 1/4)  Assessment: Roberta Ross is an 51yoF presenting with concerns for chronic nonhealing wound of right lower extremity. Past medical history notable for CKD 3a, gout, systolic CHF, dementia, hypertension, hyperlipidemia, DVT, A-fib on Coumadin , COPD, chronic kidney disease, and rheumatoid arthritis on prednisone  daily.  1/6 AM: Patient's INR 6.2, supratherapeutic. There are various potential explanations for the supratherapeutic level including age (>65yo), interaction with home allopurinol , interaction with recent clindamycin  prescription from 12/30, and the potential increased bleed risk with the chronic nonhealing wound.   Patient without symptoms and INR not due to a recent procedure. Per hospitalist, plan to hold off of oral vitamin K and check INR in the AM.  Hgb in line with historical  baseline. Plt stable. Albumin wnl.  Date  Time   INR   Comment 1/6     0139   6.2     SUPRAtherapeutic 1/7     0435   4.8     SUPRAtherapeutic, CBC w/ Hgb 8.9 and PLTs 291   Goal of Therapy:  INR 2-3 Monitor platelets by anticoagulation protocol: Yes   Plan:  Hold INR dose for today since level is SUPRAtherapeutic  Per RN, no bleeding noticed and provider notified  Will continue to monitor INR daily until stable  Monitor CBC at least every 3 days    Ransom Blanch PGY-1 Pharmacy Resident  [place] [facility]  05/06/2024 7:44 AM        [1]  Allergies Allergen Reactions   Metoprolol Shortness Of Breath   Penicillins Other (See Comments)    Internal swelling including throat TOLERATES CEFTRIAXONE    Methotrexate And Trimetrexate Nausea Only   Doxycycline Hyclate Rash

## 2024-05-06 NOTE — Progress Notes (Signed)
 " PROGRESS NOTE    Roberta Ross  FMW:[id number] DOB: 1933/12/27 DOA: 05/05/2024 PCP: Lenon Layman ORN, MD  Chief Complaint  Patient presents with   Wound Infection    Hospital Course:  Roberta Ross is an 89 year old female with A-fib on warfarin, CKD stage IIIa, gout, hypertension, systolic CHF, COPD, femoral DVT in September 2025, rheumatoid arthritis on daily prednisone , with a chronic nonhealing wound to the right lower leg after an injury in October.  She was admitted due to concerns of infection of this chronic wound and incidentally found to be rapid A-fib during ED workup. Prior to arrival her facility started her on clindamycin  due to foul-smelling wound for 1 week. In the ED she was A-fib RVR in the 130s, O2 dropped to 88%.  She was started on diltiazem . She was started on cefepime  and vancomycin  for wound infection.  Subjective: No acute events overnight.  This morning patient reports still having some pain in her leg.   Objective: Vitals:   05/06/24 0324 05/06/24 0400 05/06/24 0802 05/06/24 1208  BP: (!) 141/100 (!) 117/90 107/60 98/62  Pulse: (!) 40 60 76 83  Resp: 18  17   Temp: 98 F (36.7 C)  97.7 F (36.5 C) 97.6 F (36.4 C)  TempSrc:    Axillary  SpO2: 100%  100% 95%  Weight:        Intake/Output Summary (Last 24 hours) at 05/06/2024 1617 Last data filed at 05/06/2024 1419 Gross per 24 hour  Intake 2060.72 ml  Output 750 ml  Net 1310.72 ml   Filed Weights   05/05/24 0127  Weight: 72.6 kg    Examination: General exam: Appears calm and comfortable, NAD  Respiratory system: No work of breathing, symmetric chest wall expansion Cardiovascular system: S1 & S2 heard, RRR.  Gastrointestinal system: Abdomen is nondistended, soft and nontender.  Neuro: Alert and oriented. No focal neurological deficits. Extremities: Leg wound freshly bandaged c/d/i.  Assessment & Plan:  Principal Problem:   Chronic nonhealing wound right lower  extremity Active Problems:   Infected wound   Paroxysmal atrial fibrillation with RVR (HCC)   Supratherapeutic INR   Chronic obstructive pulmonary disease (COPD) (HCC)   Essential hypertension   Rheumatoid arthritis (HCC)   DVT (deep venous thrombosis) (HCC)   Gout   Stage 3a chronic kidney disease (HCC)   Long term current use of systemic steroids   HFrEF (heart failure with reduced ejection fraction) (HCC)   Open wound of right lower extremity    Chronic nonhealing wound right lower extremity Infected hematoma chronic wound - MRI right lower extremity: Collection of fluid underlying the investing fascia of the lateral anterior posterior compartment measuring 7.2 cm x 1 cm x 10 cm which may represent an infected hematoma given the history of prior trauma. - She was also noted to have subcutaneous edema throughout the calf. - No evidence of acute osteomyelitis on this MRI - There were some findings suspicious for knee fracture.  Dedicated MRI of the knee has been ordered - Vascular surgery was consulted and recommends Unna boot wrappings to the right lower extremity to help with compression and reduce some swelling.  Operative debriding not recommended at this time. - Preliminary cultures growing Klebsiella.  Susceptibilities to follow. Currently on Vanc can likely DC tomorrow - Blood cultures pending. - MRSA PCR ordered  Subcortical stress fracture of the posterior medial tibial plateau - Incidentally seen on lower extremity MRI.  Dedicated MRI also reveals complex degenerative  tear of posterior horn of medial meniscus and degenerative changes of the posterior horn root and lateral meniscus without a discrete tear..  Regional muscular atrophy suggesting sarcopenia - Have consulted orthopedic surgery  Therapeutic INR - INR returned at 6.2.  No active bleeding appreciated outside of hematoma in leg - Pharmacy consulted - INR gradually downtrending - Of note patient was previously  on apixaban  but had DVT thought to be apixaban  failure  Paroxysmal A-fib with RVR - Continue with diltiazem  - Warfarin as above  Acute on chronic anemia - Continue to monitor hemoglobin closely.  Transfuse under 7.  COPD, not currently in exacerbation - Continue home inhalers  Stage IIIa CKD - Kidney function at baseline - Avoid nephrotoxics  Gout - Resume home meds  Rheumatoid arthritis Immunosuppression - On chronic steroid therapy.  Generalized deconditioning - Resides at assisted living facility - PT/OT consults  Body mass index is 25.82 kg/m. - Outpatient follow up for lifestyle modification and risk factor management   DVT prophylaxis: Warfarin   Code Status: Limited: Do not attempt resuscitation (DNR) -DNR-LIMITED -Do Not Intubate/DNI  Disposition:  Inpatient pending clinical resolution   Consultants:    Procedures:    Antimicrobials:  Anti-infectives (From admission, onward)    Start     Dose/Rate Route Frequency Ordered Stop   05/06/24 0000  vancomycin  (VANCOCIN ) IVPB 1000 mg/200 mL premix        1,000 mg 200 mL/hr over 60 Minutes Intravenous Every 24 hours 05/05/24 0401     05/05/24 1200  cefTRIAXone  (ROCEPHIN ) 1 g in sodium chloride  0.9 % 100 mL IVPB  Status:  Discontinued        1 g 200 mL/hr over 30 Minutes Intravenous Every 24 hours 05/05/24 0315 05/05/24 0847   05/05/24 0130  ceFEPIme  (MAXIPIME ) 2 g in sodium chloride  0.9 % 100 mL IVPB        2 g 200 mL/hr over 30 Minutes Intravenous  Once 05/05/24 0122 05/05/24 0134   05/05/24 0130  vancomycin  (VANCOREADY) IVPB 1750 mg/350 mL        1,750 mg 175 mL/hr over 120 Minutes Intravenous  Once 05/05/24 0128 05/05/24 0445       Data Reviewed: I have personally reviewed following labs and imaging studies CBC: Recent Labs  Lab 05/04/24 1749 05/06/24 0435  WBC 10.7* 6.6  HGB 11.6* 8.9*  HCT 36.5 28.8*  MCV 95.3 98.0  PLT 382 291   Basic Metabolic Panel: Recent Labs  Lab 05/04/24 1749  05/05/24 0138 05/06/24 0435  NA 139  --  140  K 3.8  --  3.6  CL 100  --  106  CO2 29  --  26  GLUCOSE 131*  --  92  BUN 20  --  15  CREATININE 1.00  --  0.97  CALCIUM 10.4*  --  9.3  MG  --  2.2  --    GFR: Estimated Creatinine Clearance: 40.9 mL/min (by C-G formula based on SCr of 0.97 mg/dL). Liver Function Tests: No results for input(s): AST, ALT, ALKPHOS, BILITOT, PROT, ALBUMIN in the last 168 hours. CBG: No results for input(s): GLUCAP in the last 168 hours.  Recent Results (from the past 240 hours)  Blood culture (routine x 2)     Status: None (Preliminary result)   Collection Time: 05/05/24  1:25 AM   Specimen: BLOOD  Result Value Ref Range Status   Specimen Description BLOOD LEFT ANTECUBITAL  Final   Special Requests  Final    BOTTLES DRAWN AEROBIC AND ANAEROBIC Blood Culture results may not be optimal due to an inadequate volume of blood received in culture bottles   Culture   Final    NO GROWTH 1 DAY Performed at [facility], [address], [place], [place] [address]    Report Status PENDING  Incomplete  Blood culture (routine x 2)     Status: None (Preliminary result)   Collection Time: 05/05/24  1:39 AM   Specimen: BLOOD  Result Value Ref Range Status   Specimen Description BLOOD BLOOD LEFT ARM  Final   Special Requests   Final    BOTTLES DRAWN AEROBIC AND ANAEROBIC Blood Culture results may not be optimal due to an inadequate volume of blood received in culture bottles   Culture   Final    NO GROWTH 1 DAY Performed at [facility], [address] [place], [place] [address]    Report Status PENDING  Incomplete  Aerobic/Anaerobic Culture w Gram Stain (surgical/deep wound)     Status: None (Preliminary result)   Collection Time: 05/05/24  1:39 AM   Specimen: Leg; Wound  Result Value Ref Range Status   Specimen Description   Final    LEG Performed at [facility], [address] [place], [place] [address]     Special Requests   Final    NONE Performed at [facility], [address] [place], [place] [address]    Gram Stain   Final    FEW WBC PRESENT, PREDOMINANTLY PMN FEW GRAM POSITIVE COCCI    Culture   Final    MODERATE KLEBSIELLA PNEUMONIAE SUSCEPTIBILITIES TO FOLLOW Performed at [facility], [address] [place], [place] [address]    Report Status PENDING  Incomplete  Resp panel by RT-PCR (RSV, Flu A&B, Covid) Anterior Nasal Swab     Status: None   Collection Time: 05/05/24  9:12 AM   Specimen: Anterior Nasal Swab  Result Value Ref Range Status   SARS Coronavirus 2 by RT PCR NEGATIVE NEGATIVE Final    Comment: (NOTE) SARS-CoV-2 target nucleic acids are NOT DETECTED.  The SARS-CoV-2 RNA is generally detectable in upper respiratory specimens during the acute phase of infection. The lowest concentration of SARS-CoV-2 viral copies this assay can detect is 138 copies/mL. A negative result does not preclude SARS-Cov-2 infection and should not be used as the sole basis for treatment or other patient management decisions. A negative result may occur with  improper specimen collection/handling, submission of specimen other than nasopharyngeal swab, presence of viral mutation(s) within the areas targeted by this assay, and inadequate number of viral copies(<138 copies/mL). A negative result must be combined with clinical observations, patient history, and epidemiological information. The expected result is Negative.  Fact Sheet for Patients:  bloggercourse.com  Fact Sheet for Healthcare Providers:  seriousbroker.it  This test is no t yet approved or cleared by the [place]  FDA and  has been authorized for detection and/or diagnosis of SARS-CoV-2 by FDA under an Emergency Use Authorization (EUA). This EUA will remain  in effect (meaning this test can be used) for the duration of the COVID-19 declaration  under Section 564(b)(1) of the Act, 21 U.S.C.section 360bbb-3(b)(1), unless the authorization is terminated  or revoked sooner.       Influenza A by PCR NEGATIVE NEGATIVE Final   Influenza B by PCR NEGATIVE NEGATIVE Final    Comment: (NOTE) The Xpert Xpress SARS-CoV-2/FLU/RSV plus assay is  intended as an aid in the diagnosis of influenza from Nasopharyngeal swab specimens and should not be used as a sole basis for treatment. Nasal washings and aspirates are unacceptable for Xpert Xpress SARS-CoV-2/FLU/RSV testing.  Fact Sheet for Patients: bloggercourse.com  Fact Sheet for Healthcare Providers: seriousbroker.it  This test is not yet approved or cleared by the [place]  FDA and has been authorized for detection and/or diagnosis of SARS-CoV-2 by FDA under an Emergency Use Authorization (EUA). This EUA will remain in effect (meaning this test can be used) for the duration of the COVID-19 declaration under Section 564(b)(1) of the Act, 21 U.S.C. section 360bbb-3(b)(1), unless the authorization is terminated or revoked.     Resp Syncytial Virus by PCR NEGATIVE NEGATIVE Final    Comment: (NOTE) Fact Sheet for Patients: bloggercourse.com  Fact Sheet for Healthcare Providers: seriousbroker.it  This test is not yet approved or cleared by the [place]  FDA and has been authorized for detection and/or diagnosis of SARS-CoV-2 by FDA under an Emergency Use Authorization (EUA). This EUA will remain in effect (meaning this test can be used) for the duration of the COVID-19 declaration under Section 564(b)(1) of the Act, 21 U.S.C. section 360bbb-3(b)(1), unless the authorization is terminated or revoked.  Performed at [facility], [address] [place], [place] [address]      Radiology Studies: MR KNEE RIGHT WO CONTRAST Result Date: 05/06/2024 EXAM: MRI of the  right Knee Without Contrast. 05/06/2024 01:04:52 PM TECHNIQUE: Multiplanar multisequence MRI of the right knee was performed without intravenous contrast. COMPARISON: MR Right Lower Extremity 05/05/2024. CLINICAL HISTORY: medial tibial plateau lesion on prior calf MRI, for further characterization. FINDINGS: MEDIAL MENISCUS: Complex degenerative tear of the posterior horn medial meniscus with ill definition of most of the posterior horn. Degenerative findings in the root of the posterior horn. LATERAL MENISCUS: Degenerative findings in the root of the posterior horn and lateral meniscus without definite tear. ANTERIOR CRUCIATE LIGAMENT: There is potential mild ACL degeneration with faint internal signal but no overt tear. POSTERIOR CRUCIATE LIGAMENT: Intact posterior cruciate ligament. EXTENSOR MECHANISM: Intact quadriceps and patellar tendons. Intact patellar retinacula. LATERAL COLLATERAL LIGAMENT COMPLEX: Intact IT band, lateral collateral ligament proper, biceps femoris tendon and popliteus tendon. MEDIAL COLLATERAL LIGAMENT COMPLEX: The superficial and deep components of the medial collateral ligament are intact. KNEE JOINT: Moderate degenerative chondral thinning in the medial compartment and lateral compartment. Small knee joint effusion. BONE MARROW: Subcortical stress fracture posteriorly in the medial tibial plateau with surrounding marrow edema as shown on image 20 of series 12. Regional muscular atrophy suggesting sarcopenia. SOFT TISSUE: Mild distal semimembranosus tendinopathy. Subcutaneous edema posteriorly and laterally in the knee. LIMITATIONS/ARTIFACTS: Motion artifact. IMPRESSION: 1. Subcortical stress fracture of the posterior medial tibial plateau. 2. Complex degenerative tear of the posterior horn medial meniscus. 3. Degenerative changes of the posterior horn root and lateral meniscus without a discrete tear. 4. Moderate degenerative chondral thinning in the medial and lateral compartments. 5.  Mild anterior cruciate ligament degeneration without tear. 6. Small knee joint effusion. 7. Mild distal semimembranosus tendinopathy. 8. Posterolateral knee subcutaneous edema. 9. Regional muscular atrophy suggesting sarcopenia. Electronically signed by: Ryan Salvage MD MD 05/06/2024 03:42 PM EST RP Workstation: [id number]   MR TIBIA FIBULA RIGHT W WO CONTRAST Result Date: 05/05/2024 CLINICAL DATA:  Chronic nonhealing wound of the right lower extremity. History of trauma to the right lower extremity a few months prior. EXAM: MRI OF LOWER RIGHT EXTREMITY WITHOUT AND WITH CONTRAST TECHNIQUE: Multiplanar, multisequence  MR imaging of the right leg was performed both before and after administration of intravenous contrast. CONTRAST:  7mL GADAVIST  GADOBUTROL  1 MMOL/ML IV SOLN COMPARISON:  None available. FINDINGS: Bones/Joint/Cartilage No evidence of acute osteomyelitis. Large field-of-view coronal sequences demonstrate subchondral curvilinear low signal abnormality of the right medial tibial plateau with surrounding edema like signal, suspicious for a subchondral insufficiency fracture (series 4, image 14). Muscles and Tendons Generalized atrophy of the anterior and posterior compartment musculature of the calf with patchy nonenhancing increased T2 signal intensity, likely reflecting sequela of denervation changes. No intramuscular collection. Edema and enhancement of the investing fascia overlying the mid to distal peroneal musculature and lateral soleus muscle. Soft tissue Wound extending along the mid to distal lateral calf with complex peripherally enhancing subcutaneous fluid collection demonstrating heterogenous T2 hyperintense and T1 isointense signal with areas of mixed T1 hyperintensity extending to the underlying investing fascia of the lateral anterior and posterior compartment. This collection measures up to 7.2 cm TR x 1 cm AP x 10 cm CC. This may represent an infected hematoma given the history of  prior trauma. There is surrounding cutaneous thickening and enhancement. Diffuse subcutaneous edema extending circumferentially throughout the calf. IMPRESSION: 1. Wound along the mid to distal lateral calf with complex peripherally enhancing subcutaneous fluid collection extending to the underlying investing fascia of the lateral anterior and posterior compartment with associated enhancement. This collection measures up to 7.2 cm TR x 1 cm AP x 10 cm CC and may represent an infected hematoma given the history of prior trauma. Surrounding cutaneous thickening and enhancement with diffuse subcutaneous edema extending throughout the calf, cellulitis is not excluded. 2. No evidence of acute osteomyelitis. 3. Findings suspicious for subchondral insufficiency fracture of the right medial tibial plateau with surrounding marrow edema noted on limited large field-of-view coronal sequences. Recommend correlation with history/exam. Dedicated MRI of the knee could be considered for further evaluation Electronically Signed   By: Harrietta Sherry M.D.   On: 05/05/2024 09:09    Scheduled Meds:  allopurinol   50 mg Oral Daily   diltiazem   240 mg Oral Daily   feeding supplement  237 mL Oral BID BM   melatonin  5 mg Oral QHS   polyethylene glycol  17 g Oral Daily   predniSONE   5 mg Oral Daily   sertraline   50 mg Oral Daily   simvastatin   10 mg Oral QPM   Warfarin - Pharmacist Dosing Inpatient   Does not apply q1600   Continuous Infusions:  sodium chloride  75 mL/hr at 05/05/24 1857   vancomycin  1,000 mg (05/05/24 2306)     LOS: 1 day  MDM: Patient is high risk for one or more organ failure.  They necessitate ongoing hospitalization for continued IV therapies and subsequent lab monitoring. Total time spent interpreting labs and vitals, reviewing the medical record, coordinating care amongst consultants and care team members, directly assessing and discussing care with the patient and/or family: 55 min  Nakoma Gotwalt, DO [facility]  To contact the attending physician between 7A-7P please use Epic Chat. To contact the covering physician during after hours 7P-7A, please review Amion.  05/06/2024, 4:17 PM   *This document has been created with the assistance of dictation software. Please excuse typographical errors. *   "

## 2024-05-06 NOTE — Evaluation (Signed)
 Physical Therapy Evaluation Patient Details Name: Roberta Ross MRN: [id number] DOB: 14-Jul-1933 Today's Date: 05/06/2024  History of Present Illness  Roberta Ross is n 89yoF who comes to [facility] on 05/04/24 from [facility] with a poorly healing odorous wound on Right leg. PMH: AF on warfarin, CKD3a, gout, HTN, sCHF, COPD, DVT, RA on daily prednisone . In ED pt went into RVR in the 130s with a drop in her O2 sat to 88%. MRI RLE negative for acute osteo. Right medial tibial plateau with surrounding edema like signal, suspicious for a subchondral insufficiency fracture. INR >6 on arrival, PharmD consulted and following. Pt last seen by our services in Nov 2025 after closed reduction left GHJ dislocation. Most recent FU with ortho on 05/01/24 recs for Rt rTSA given RC tear and risk for recurrent dislocation, pt declines. Pt has a frequent falls history. As of ortho FU on 05/01/24 pt has WBAT ad lib use of RUE.  Clinical Impression  Pt somnolent upon first engagement, more alert and able to participate in afternoon. Pt oriented to situation, baseline memory difficulty. Pt agreeable to simulation of most recent baseline mobility tasks which includes staff assisted pivot transfers to [facility]. Pt able to come to standing 4x in session, move to chair, then back to EOB. Pt requires minA for bed mboility, typically sleeps in recliner at ALF. Pt denies any frank acute limitation in strength or activity tolerance. Biggest limitation stems from fear of falling, but confidence improved over time with repeat practice, encouraging words, promotion of team work concepts. Pt partakes of session on room air, sats at 94% or higher. No dizziness or dyspnea. Pt can return to ALF upon DC, appropriate to commence HHPT services once able- progress with mobility with be heavily dependent on congruence between pt and clinical goals of care. No additional PT needs here at this time. Pt will perform baseline mobility with NSG.        If plan is discharge home, recommend the following: A little help with walking and/or transfers;Assist for transportation;Two people to help with walking and/or transfers   Can travel by private vehicle        Equipment Recommendations None recommended by PT  Recommendations for Other Services       Functional Status Assessment Patient has not had a recent decline in their functional status     Precautions / Restrictions Precautions Precautions: Fall Restrictions Weight Bearing Restrictions Per Provider Order: Yes RUE Weight Bearing Per Provider Order: Weight bearing as tolerated      Mobility  Bed Mobility Overal bed mobility: Needs Assistance Bed Mobility: Supine to Sit, Sit to Supine     Supine to sit: Min assist, HOB elevated Sit to supine: Min assist, HOB elevated        Transfers Overall transfer level: Needs assistance   Transfers: Sit to/from Stand, Bed to chair/wheelchair/BSC Sit to Stand: Min assist   Step pivot transfers: Min assist       General transfer comment: multiple efforts performed, biggest limitation is confidence, minA required durign best performance; minA steady durign step pivot.    Ambulation/Gait Ambulation/Gait assistance:  (Pt has stopped walking due to repeat falls and falls anxiety)                Stairs            Wheelchair Mobility     Tilt Bed    Modified Rankin (Stroke Patients Only)       Balance  Pertinent Vitals/Pain Pain Assessment Pain Assessment: No/denies pain    Home Living Family/patient expects to be discharged to:: Assisted living                 Home Equipment: Wheelchair - manual      Prior Function Prior Level of Function : Needs assist;History of Falls (last six months)             Mobility Comments: most recently limited by NWB UE and falls anxiety; tranwfers only since November. ADLs Comments:  Requires assistnace     Extremity/Trunk Assessment   Upper Extremity Assessment Upper Extremity Assessment: Overall WFL for tasks assessed    Lower Extremity Assessment Lower Extremity Assessment: Overall WFL for tasks assessed       Communication        Cognition Arousal: Alert Behavior During Therapy: WFL for tasks assessed/performed   PT - Cognitive impairments: History of cognitive impairments                                 Cueing       General Comments      Exercises     Assessment/Plan    PT Assessment All further PT needs can be met in the next venue of care  PT Problem List Decreased strength;Decreased activity tolerance;Decreased balance;Decreased mobility;Decreased range of motion;Decreased safety awareness       PT Treatment Interventions      PT Goals (Current goals can be found in the Care Plan section)  Acute Rehab PT Goals PT Goal Formulation: All assessment and education complete, DC therapy    Frequency       Co-evaluation               AM-PAC PT 6 Clicks Mobility  Outcome Measure Help needed turning from your back to your side while in a flat bed without using bedrails?: A Lot Help needed moving from lying on your back to sitting on the side of a flat bed without using bedrails?: A Lot Help needed moving to and from a bed to a chair (including a wheelchair)?: A Lot Help needed standing up from a chair using your arms (e.g., wheelchair or bedside chair)?: A Lot Help needed to walk in hospital room?: Total Help needed climbing 3-5 steps with a railing? : Total 6 Click Score: 10    End of Session Equipment Utilized During Treatment: Oxygen Activity Tolerance: Patient tolerated treatment well;No increased pain Patient left: in bed Nurse Communication: Mobility status PT Visit Diagnosis: Other abnormalities of gait and mobility (R26.89)    Time: 8549-8484 PT Time Calculation (min) (ACUTE ONLY): 25  min   Charges:   PT Evaluation $PT Eval Moderate Complexity: 1 Mod PT Treatments $Therapeutic Activity: 8-22 mins PT General Charges $$ ACUTE PT VISIT: 1 Visit       3:31 PM, 05/06/2024 Peggye JAYSON Linear, PT, DPT Physical Therapist - [facility]  [phone] (ASCOM)    Mckinlee Dunk C 05/06/2024, 3:27 PM

## 2024-05-06 NOTE — Plan of Care (Signed)
 Pt is alert oriented x 4 on admission but became confused later in shift. Pt reoriented. Pt is bedrest. Pt c/o generalized pain from rheumatoid arhritis, prn tylenol  given with effective results. Pictures taken of pts wounds. RLL wound cleansed and dressed per order. IV fluids continued per order. IV antibiotics continued per order. Call button within reach. No distress noted.    Problem: Education: Goal: Knowledge of General Education information will improve Description: Including pain rating scale, medication(s)/side effects and non-pharmacologic comfort measures Outcome: Progressing   Problem: Health Behavior/Discharge Planning: Goal: Ability to manage health-related needs will improve Outcome: Progressing   Problem: Clinical Measurements: Goal: Ability to maintain clinical measurements within normal limits will improve Outcome: Progressing Goal: Will remain free from infection Outcome: Progressing Goal: Diagnostic test results will improve Outcome: Progressing Goal: Respiratory complications will improve Outcome: Progressing Goal: Cardiovascular complication will be avoided Outcome: Progressing   Problem: Activity: Goal: Risk for activity intolerance will decrease Outcome: Progressing   Problem: Nutrition: Goal: Adequate nutrition will be maintained Outcome: Progressing   Problem: Coping: Goal: Level of anxiety will decrease Outcome: Progressing   Problem: Elimination: Goal: Will not experience complications related to bowel motility Outcome: Progressing Goal: Will not experience complications related to urinary retention Outcome: Progressing   Problem: Pain Managment: Goal: General experience of comfort will improve and/or be controlled Outcome: Progressing   Problem: Safety: Goal: Ability to remain free from injury will improve Outcome: Progressing   Problem: Skin Integrity: Goal: Risk for impaired skin integrity will decrease Outcome: Progressing    Problem: Clinical Measurements: Goal: Ability to avoid or minimize complications of infection will improve Outcome: Progressing   Problem: Skin Integrity: Goal: Skin integrity will improve Outcome: Progressing

## 2024-05-06 NOTE — Plan of Care (Signed)

## 2024-05-07 ENCOUNTER — Other Ambulatory Visit: Payer: Self-pay

## 2024-05-07 DIAGNOSIS — L98493 Non-pressure chronic ulcer of skin of other sites with necrosis of muscle: Secondary | ICD-10-CM | POA: Diagnosis not present

## 2024-05-07 DIAGNOSIS — L089 Local infection of the skin and subcutaneous tissue, unspecified: Secondary | ICD-10-CM | POA: Diagnosis not present

## 2024-05-07 LAB — COMPREHENSIVE METABOLIC PANEL WITH GFR
ALT: 6 U/L (ref 0–44)
AST: 10 U/L — ABNORMAL LOW (ref 15–41)
Albumin: 2.7 g/dL — ABNORMAL LOW (ref 3.5–5.0)
Alkaline Phosphatase: 76 U/L (ref 38–126)
Anion gap: 6 (ref 5–15)
BUN: 15 mg/dL (ref 8–23)
CO2: 25 mmol/L (ref 22–32)
Calcium: 9.7 mg/dL (ref 8.9–10.3)
Chloride: 108 mmol/L (ref 98–111)
Creatinine, Ser: 0.83 mg/dL (ref 0.44–1.00)
GFR, Estimated: >60 mL/min
Glucose, Bld: 121 mg/dL — ABNORMAL HIGH (ref 70–99)
Potassium: 3.7 mmol/L (ref 3.5–5.1)
Sodium: 140 mmol/L (ref 135–145)
Total Bilirubin: <0.2 mg/dL (ref 0.0–1.2)
Total Protein: 5.3 g/dL — ABNORMAL LOW (ref 6.5–8.1)

## 2024-05-07 LAB — CBC
HCT: 28.7 % — ABNORMAL LOW (ref 36.0–46.0)
Hemoglobin: 8.8 g/dL — ABNORMAL LOW (ref 12.0–15.0)
MCH: 29.9 pg (ref 26.0–34.0)
MCHC: 30.7 g/dL (ref 30.0–36.0)
MCV: 97.6 fL (ref 80.0–100.0)
Platelets: 276 K/uL (ref 150–400)
RBC: 2.94 MIL/uL — ABNORMAL LOW (ref 3.87–5.11)
RDW: 13.9 % (ref 11.5–15.5)
WBC: 7.3 K/uL (ref 4.0–10.5)
nRBC: 0 % (ref 0.0–0.2)

## 2024-05-07 LAB — PROTIME-INR
INR: 2.3 — ABNORMAL HIGH (ref 0.8–1.2)
INR: 1.9 — ABNORMAL HIGH (ref 0.8–1.2)
Prothrombin Time: 26.6 s — ABNORMAL HIGH (ref 11.4–15.2)
Prothrombin Time: 22.9 s — ABNORMAL HIGH (ref 11.4–15.2)

## 2024-05-07 MED ORDER — WARFARIN SODIUM 4 MG PO TABS
4.0000 mg | ORAL_TABLET | Freq: Once | ORAL | Status: AC
  Administered 2024-05-07: 4 mg via ORAL
  Filled 2024-05-07: qty 1

## 2024-05-07 MED ORDER — LINEZOLID 600 MG PO TABS
600.0000 mg | ORAL_TABLET | Freq: Two times a day (BID) | ORAL | Status: DC
  Administered 2024-05-07 – 2024-05-11 (×8): 600 mg via ORAL
  Filled 2024-05-07 (×9): qty 1

## 2024-05-07 NOTE — Progress Notes (Signed)
 " PROGRESS NOTE    Roberta Ross  FMW:[id number] DOB: [birth year] DOA: 05/05/2024 PCP: Lenon Layman ORN, MD  Chief Complaint  Patient presents with   Wound Infection    Hospital Course:  Roberta Ross is an 89 year old female with A-fib on warfarin, CKD stage IIIa, gout, hypertension, systolic CHF, COPD, femoral DVT in September 2025, rheumatoid arthritis on daily prednisone , with a chronic nonhealing wound to the right lower leg after an injury in October.  She was admitted due to concerns of infection of this chronic wound and incidentally found to be rapid A-fib during ED workup. Prior to arrival her facility started her on clindamycin  due to foul-smelling wound for 1 week. In the ED she was A-fib RVR in the 130s, O2 dropped to 88%.  She was started on diltiazem . She was started on cefepime  and vancomycin  for wound infection.  Cultures are growing Klebsiella without resistance.  Antibiotic therapy was narrowed to ceftriaxone . Stay further complicated by newly found stress fracture of the right tibial plateau.  Patient was placed in knee immobilizer and made nonweightbearing.  Due to this change in her ambulatory status they recommended for SNF placement.  TOC is working on this  Subjective: No acute events overnight.  We discussed the newly found fracture in her leg and need for knee immobilizer.  Patient is disappointed.  She does understand we will need to discharge to rehab given she will no longer be able to bear weight on this leg until fracture is healed.  Objective: Vitals:   05/06/24 2012 05/06/24 2325 05/07/24 0314 05/07/24 1243  BP: 99/67 113/65 100/60 (!) 125/92  Pulse: 81 89 70 88  Resp: 18 18 20    Temp: 98.2 F (36.8 C) 97.9 F (36.6 C) 98 F (36.7 C) 97.8 F (36.6 C)  TempSrc:    Oral  SpO2: 98% 96% 98% 95%  Weight:        Intake/Output Summary (Last 24 hours) at 05/07/2024 1543 Last data filed at 05/07/2024 1018 Gross per 24 hour  Intake  2472.68 ml  Output 1100 ml  Net 1372.68 ml   Filed Weights   05/05/24 0127  Weight: 72.6 kg    Examination: General exam: Appears calm and comfortable, NAD  Respiratory system: No work of breathing, symmetric chest wall expansion Cardiovascular system: S1 & S2 heard, RRR.  Gastrointestinal system: Abdomen is nondistended, soft and nontender.  Neuro: Alert and oriented. No focal neurological deficits. Extremities: Leg wound freshly bandaged c/d/i.  Assessment & Plan:  Principal Problem:   Chronic nonhealing wound right lower extremity Active Problems:   Infected wound   Paroxysmal atrial fibrillation with RVR (HCC)   Supratherapeutic INR   Chronic obstructive pulmonary disease (COPD) (HCC)   Essential hypertension   Rheumatoid arthritis (HCC)   DVT (deep venous thrombosis) (HCC)   Gout   Stage 3a chronic kidney disease (HCC)   Long term current use of systemic steroids   HFrEF (heart failure with reduced ejection fraction) (HCC)   Open wound of right lower extremity    Chronic nonhealing wound right lower extremity Infected hematoma chronic wound - MRI right lower extremity: Collection of fluid underlying the investing fascia of the lateral anterior posterior compartment measuring 7.2 cm x 1 cm x 10 cm which may represent an infected hematoma given the history of prior trauma. - She was also noted to have subcutaneous edema throughout the calf. - No evidence of acute osteomyelitis on this MRI - Vascular surgery was consulted  and recommends Unna boot wrappings to the right lower extremity to help with compression and reduce some swelling.  Operative debriding not recommended at this time. - Cultures revealed Klebsiella sensitive to all except ampicillin, as well as Enterococcus faecalis. - Vancomycin  discontinued - Have reached out to ID Pharm and ID for antibiotic duration recommendations - Blood cultures remain negative  Subcortical stress fracture of the posterior  medial tibial plateau - Incidentally seen on lower extremity MRI.  Dedicated MRI also reveals complex degenerative tear of posterior horn of medial meniscus and degenerative changes of the posterior horn root and lateral meniscus without a discrete tear..  Regional muscular atrophy suggesting sarcopenia - Discussed with orthopedic surgery who recommends knee immobilizer and nonweightbearing.  I have updated PT as to her new findings - PT/OT now recommending SNF.  TOC aware working on these arrangements  Therapeutic INR - INR returned at 6.2.  No active bleeding appreciated outside of hematoma in leg - Pharmacy consulted, appreciate medication adjustments  - INR gradually downtrending - Of note patient was previously on apixaban  but had DVT thought to be apixaban  failure  Paroxysmal A-fib with RVR - Continue with diltiazem  - Warfarin as above  Acute on chronic anemia - Continue to monitor hemoglobin closely.  Transfuse under 7.  COPD, not currently in exacerbation - Continue home inhalers  Stage IIIa CKD - Kidney function at baseline - Avoid nephrotoxics  Gout - Resume home meds  Rheumatoid arthritis Immunosuppression - On chronic steroid therapy.  Generalized deconditioning - Resides at assisted living facility - PT/OT commending SNF.  TOC working on arrangement  Body mass index is 25.82 kg/m. - Outpatient follow up for lifestyle modification and risk factor management   DVT prophylaxis: Warfarin   Code Status: Limited: Do not attempt resuscitation (DNR) -DNR-LIMITED -Do Not Intubate/DNI  Disposition: Needs SNF  Consultants:  Treatment Team:  Consulting Physician: Tobie Priest, DO  Procedures:    Antimicrobials:  Anti-infectives (From admission, onward)    Start     Dose/Rate Route Frequency Ordered Stop   05/06/24 1800  cefTRIAXone  (ROCEPHIN ) 2 g in sodium chloride  0.9 % 100 mL IVPB        2 g 200 mL/hr over 30 Minutes Intravenous Every 24 hours 05/06/24  1642 05/13/24 1759   05/06/24 0000  vancomycin  (VANCOCIN ) IVPB 1000 mg/200 mL premix        1,000 mg 200 mL/hr over 60 Minutes Intravenous Every 24 hours 05/05/24 0401     05/05/24 1200  cefTRIAXone  (ROCEPHIN ) 1 g in sodium chloride  0.9 % 100 mL IVPB  Status:  Discontinued        1 g 200 mL/hr over 30 Minutes Intravenous Every 24 hours 05/05/24 0315 05/05/24 0847   05/05/24 0130  ceFEPIme  (MAXIPIME ) 2 g in sodium chloride  0.9 % 100 mL IVPB        2 g 200 mL/hr over 30 Minutes Intravenous  Once 05/05/24 0122 05/05/24 0134   05/05/24 0130  vancomycin  (VANCOREADY) IVPB 1750 mg/350 mL        1,750 mg 175 mL/hr over 120 Minutes Intravenous  Once 05/05/24 0128 05/05/24 0445       Data Reviewed: I have personally reviewed following labs and imaging studies CBC: Recent Labs  Lab 05/04/24 1749 05/06/24 0435 05/07/24 0419  WBC 10.7* 6.6 7.3  HGB 11.6* 8.9* 8.8*  HCT 36.5 28.8* 28.7*  MCV 95.3 98.0 97.6  PLT 382 291 276   Basic Metabolic Panel: Recent Labs  Lab  05/04/24 1749 05/05/24 0138 05/06/24 0435 05/07/24 0419  NA 139  --  140 140  K 3.8  --  3.6 3.7  CL 100  --  106 108  CO2 29  --  26 25  GLUCOSE 131*  --  92 121*  BUN 20  --  15 15  CREATININE 1.00  --  0.97 0.83  CALCIUM 10.4*  --  9.3 9.7  MG  --  2.2  --   --    GFR: Estimated Creatinine Clearance: 47.8 mL/min (by C-G formula based on SCr of 0.83 mg/dL). Liver Function Tests: Recent Labs  Lab 05/07/24 0419  AST 10*  ALT 6  ALKPHOS 76  BILITOT <0.2  PROT 5.3*  ALBUMIN 2.7*   CBG: No results for input(s): GLUCAP in the last 168 hours.  Recent Results (from the past 240 hours)  Blood culture (routine x 2)     Status: None (Preliminary result)   Collection Time: 05/05/24  1:25 AM   Specimen: BLOOD  Result Value Ref Range Status   Specimen Description BLOOD LEFT ANTECUBITAL  Final   Special Requests   Final    BOTTLES DRAWN AEROBIC AND ANAEROBIC Blood Culture results may not be optimal due to an  inadequate volume of blood received in culture bottles   Culture   Final    NO GROWTH 1 DAY Performed at [facility], [address] [place], [place] [address]    Report Status PENDING  Incomplete  Blood culture (routine x 2)     Status: None (Preliminary result)   Collection Time: 05/05/24  1:39 AM   Specimen: BLOOD  Result Value Ref Range Status   Specimen Description BLOOD BLOOD LEFT ARM  Final   Special Requests   Final    BOTTLES DRAWN AEROBIC AND ANAEROBIC Blood Culture results may not be optimal due to an inadequate volume of blood received in culture bottles   Culture   Final    NO GROWTH 1 DAY Performed at [facility], [address] [place], [place] [address]    Report Status PENDING  Incomplete  Aerobic/Anaerobic Culture w Gram Stain (surgical/deep wound)     Status: None (Preliminary result)   Collection Time: 05/05/24  1:39 AM   Specimen: Leg; Wound  Result Value Ref Range Status   Specimen Description   Final    LEG Performed at [facility], [address] [place], [place] [address]    Special Requests   Final    NONE Performed at [facility], [address] [place], [place] [address]    Gram Stain   Final    FEW WBC PRESENT, PREDOMINANTLY PMN FEW GRAM POSITIVE COCCI Performed at [facility], [address] [place], [place] [address]    Culture   Final    MODERATE KLEBSIELLA PNEUMONIAE MODERATE ENTEROCOCCUS FAECALIS NO ANAEROBES ISOLATED; CULTURE IN PROGRESS FOR 5 DAYS    Report Status PENDING  Incomplete   Organism ID, Bacteria KLEBSIELLA PNEUMONIAE  Final      Susceptibility   Klebsiella pneumoniae - MIC*    AMPICILLIN RESISTANT Resistant     CEFAZOLIN (NON-URINE) <=1 SENSITIVE Sensitive     CEFEPIME  <=0.12 SENSITIVE Sensitive     ERTAPENEM <=0.12 SENSITIVE Sensitive     CEFTRIAXONE  <=0.25 SENSITIVE Sensitive     CIPROFLOXACIN <=0.06 SENSITIVE Sensitive     GENTAMICIN <=1 SENSITIVE Sensitive      MEROPENEM <=0.25 SENSITIVE Sensitive  TRIMETH/SULFA <=20 SENSITIVE Sensitive     AMPICILLIN/SULBACTAM 4 SENSITIVE Sensitive     PIP/TAZO Value in next row Sensitive      <=4 SENSITIVEThis is a modified FDA-approved test that has been validated and its performance characteristics determined by the reporting laboratory.  This laboratory is certified under the Clinical Laboratory Improvement Amendments CLIA as qualified to perform high complexity clinical laboratory testing.    * MODERATE KLEBSIELLA PNEUMONIAE  Resp panel by RT-PCR (RSV, Flu A&B, Covid) Anterior Nasal Swab     Status: None   Collection Time: 05/05/24  9:12 AM   Specimen: Anterior Nasal Swab  Result Value Ref Range Status   SARS Coronavirus 2 by RT PCR NEGATIVE NEGATIVE Final    Comment: (NOTE) SARS-CoV-2 target nucleic acids are NOT DETECTED.  The SARS-CoV-2 RNA is generally detectable in upper respiratory specimens during the acute phase of infection. The lowest concentration of SARS-CoV-2 viral copies this assay can detect is 138 copies/mL. A negative result does not preclude SARS-Cov-2 infection and should not be used as the sole basis for treatment or other patient management decisions. A negative result may occur with  improper specimen collection/handling, submission of specimen other than nasopharyngeal swab, presence of viral mutation(s) within the areas targeted by this assay, and inadequate number of viral copies(<138 copies/mL). A negative result must be combined with clinical observations, patient history, and epidemiological information. The expected result is Negative.  Fact Sheet for Patients:  bloggercourse.com  Fact Sheet for Healthcare Providers:  seriousbroker.it  This test is no t yet approved or cleared by the [place]  FDA and  has been authorized for detection and/or diagnosis of SARS-CoV-2 by FDA under an Emergency Use Authorization (EUA).  This EUA will remain  in effect (meaning this test can be used) for the duration of the COVID-19 declaration under Section 564(b)(1) of the Act, 21 U.S.C.section 360bbb-3(b)(1), unless the authorization is terminated  or revoked sooner.       Influenza A by PCR NEGATIVE NEGATIVE Final   Influenza B by PCR NEGATIVE NEGATIVE Final    Comment: (NOTE) The Xpert Xpress SARS-CoV-2/FLU/RSV plus assay is intended as an aid in the diagnosis of influenza from Nasopharyngeal swab specimens and should not be used as a sole basis for treatment. Nasal washings and aspirates are unacceptable for Xpert Xpress SARS-CoV-2/FLU/RSV testing.  Fact Sheet for Patients: bloggercourse.com  Fact Sheet for Healthcare Providers: seriousbroker.it  This test is not yet approved or cleared by the [place]  FDA and has been authorized for detection and/or diagnosis of SARS-CoV-2 by FDA under an Emergency Use Authorization (EUA). This EUA will remain in effect (meaning this test can be used) for the duration of the COVID-19 declaration under Section 564(b)(1) of the Act, 21 U.S.C. section 360bbb-3(b)(1), unless the authorization is terminated or revoked.     Resp Syncytial Virus by PCR NEGATIVE NEGATIVE Final    Comment: (NOTE) Fact Sheet for Patients: bloggercourse.com  Fact Sheet for Healthcare Providers: seriousbroker.it  This test is not yet approved or cleared by the [place]  FDA and has been authorized for detection and/or diagnosis of SARS-CoV-2 by FDA under an Emergency Use Authorization (EUA). This EUA will remain in effect (meaning this test can be used) for the duration of the COVID-19 declaration under Section 564(b)(1) of the Act, 21 U.S.C. section 360bbb-3(b)(1), unless the authorization is terminated or revoked.  Performed at [facility], [address],  [place], [place] [address]   MRSA Next Gen by  PCR, Nasal     Status: None   Collection Time: 05/06/24  4:43 PM   Specimen: Nasal Mucosa; Nasal Swab  Result Value Ref Range Status   MRSA by PCR Next Gen NOT DETECTED NOT DETECTED Final    Comment: (NOTE) The GeneXpert MRSA Assay (FDA approved for NASAL specimens only), is one component of a comprehensive MRSA colonization surveillance program. It is not intended to diagnose MRSA infection nor to guide or monitor treatment for MRSA infections. Test performance is not FDA approved in patients less than 71 years old. Performed at [facility], [address] [place], [place] [address]      Radiology Studies: MR KNEE RIGHT WO CONTRAST Result Date: 05/06/2024 EXAM: MRI of the right Knee Without Contrast. 05/06/2024 01:04:52 PM TECHNIQUE: Multiplanar multisequence MRI of the right knee was performed without intravenous contrast. COMPARISON: MR Right Lower Extremity 05/05/2024. CLINICAL HISTORY: medial tibial plateau lesion on prior calf MRI, for further characterization. FINDINGS: MEDIAL MENISCUS: Complex degenerative tear of the posterior horn medial meniscus with ill definition of most of the posterior horn. Degenerative findings in the root of the posterior horn. LATERAL MENISCUS: Degenerative findings in the root of the posterior horn and lateral meniscus without definite tear. ANTERIOR CRUCIATE LIGAMENT: There is potential mild ACL degeneration with faint internal signal but no overt tear. POSTERIOR CRUCIATE LIGAMENT: Intact posterior cruciate ligament. EXTENSOR MECHANISM: Intact quadriceps and patellar tendons. Intact patellar retinacula. LATERAL COLLATERAL LIGAMENT COMPLEX: Intact IT band, lateral collateral ligament proper, biceps femoris tendon and popliteus tendon. MEDIAL COLLATERAL LIGAMENT COMPLEX: The superficial and deep components of the medial collateral ligament are intact. KNEE JOINT: Moderate degenerative chondral thinning in the  medial compartment and lateral compartment. Small knee joint effusion. BONE MARROW: Subcortical stress fracture posteriorly in the medial tibial plateau with surrounding marrow edema as shown on image 20 of series 12. Regional muscular atrophy suggesting sarcopenia. SOFT TISSUE: Mild distal semimembranosus tendinopathy. Subcutaneous edema posteriorly and laterally in the knee. LIMITATIONS/ARTIFACTS: Motion artifact. IMPRESSION: 1. Subcortical stress fracture of the posterior medial tibial plateau. 2. Complex degenerative tear of the posterior horn medial meniscus. 3. Degenerative changes of the posterior horn root and lateral meniscus without a discrete tear. 4. Moderate degenerative chondral thinning in the medial and lateral compartments. 5. Mild anterior cruciate ligament degeneration without tear. 6. Small knee joint effusion. 7. Mild distal semimembranosus tendinopathy. 8. Posterolateral knee subcutaneous edema. 9. Regional muscular atrophy suggesting sarcopenia. Electronically signed by: Ryan Salvage MD MD 05/06/2024 03:42 PM EST RP Workstation: [id number]    Scheduled Meds:  allopurinol   50 mg Oral Daily   diltiazem   240 mg Oral Daily   feeding supplement  237 mL Oral BID BM   melatonin  5 mg Oral QHS   polyethylene glycol  17 g Oral Daily   predniSONE   5 mg Oral Daily   sertraline   50 mg Oral Daily   simvastatin   10 mg Oral QPM   warfarin  4 mg Oral ONCE-1600   Warfarin - Pharmacist Dosing Inpatient   Does not apply q1600   Continuous Infusions:  sodium chloride  75 mL/hr at 05/07/24 0057   cefTRIAXone  (ROCEPHIN )  IV 2 g (05/06/24 1857)   vancomycin  1,000 mg (05/06/24 2309)     LOS: 2 days  MDM: Patient is high risk for one or more organ failure.  They necessitate ongoing hospitalization for continued IV therapies and subsequent lab monitoring. Total time spent interpreting labs and vitals, reviewing the medical record, coordinating care amongst  consultants and care team members,  directly assessing and discussing care with the patient and/or family: 55 min  Toshie Demelo, DO [facility]  To contact the attending physician between 7A-7P please use Epic Chat. To contact the covering physician during after hours 7P-7A, please review Amion.  05/07/2024, 3:43 PM   *This document has been created with the assistance of dictation software. Please excuse typographical errors. *   "

## 2024-05-07 NOTE — Evaluation (Signed)
 Physical Therapy Evaluation Patient Details Name: Roberta Ross MRN: [id number] DOB: [birth year] Today's Date: 05/07/2024  History of Present Illness  Pt is an 89yoF who comes to [facility] on 05/04/24 from [facility] with a poorly healing odorous wound on Right leg. PMH: AF on warfarin, CKD3a, gout, HTN, sCHF, COPD, DVT, RA on daily prednisone . In ED pt went into RVR in the 130s with a drop in her O2 sat to 88%. Pt last seen by our services in Nov 2025 after closed reduction left GHJ dislocation. Most recent FU with ortho on 05/01/24 recs for Rt rTSA given RC tear and risk for recurrent dislocation, pt declines. Pt has a frequent falls history. As of ortho FU on 05/01/24 pt has WBAT ad lib use of RUE.  Per MRI impression subcortical stress fracture of the posterior medial tibial plateau. MD assessment also includes: chronic nonhealing wound right lower extremity, acute on chronic anemia, and generalized deconditioning.   Clinical Impression  Pt pleasant and put forth fair effort during the session.  Pt required physical assist with bed mobility tasks per below with KI donned to RLE.  Once in sitting at EOB pt was unable to maintain static sitting balance with instability likely exacerbated by KI with pt requiring near constant assist to prevent posterior LOB.  During attempts to address posterior lean in sitting pt became nauseous and requested to return to supine with nursing entering room at end of session and notified.  Pt will benefit from continued PT services upon discharge to safely address deficits listed in patient problem list for decreased caregiver assistance and eventual return to PLOF.       If plan is discharge home, recommend the following: Two people to help with walking and/or transfers;A lot of help with bathing/dressing/bathroom;Assistance with cooking/housework;Direct supervision/assist for medications management;Assist for transportation   Can travel by private vehicle    No    Equipment Recommendations Other (comment) (TBD at next venue of care)  Recommendations for Other Services       Functional Status Assessment Patient has had a recent decline in their functional status and/or demonstrates limited ability to make significant improvements in function in a reasonable and predictable amount of time     Precautions / Restrictions Precautions Precautions: Fall Required Braces or Orthoses: Knee Immobilizer - Right Knee Immobilizer - Right: On when out of bed or walking Restrictions Weight Bearing Restrictions Per Provider Order: Yes RUE Weight Bearing Per Provider Order: Weight bearing as tolerated RLE Weight Bearing Per Provider Order: Non weight bearing      Mobility  Bed Mobility Overal bed mobility: Needs Assistance       Supine to sit: Min assist, Mod assist Sit to supine: Min assist, Mod assist   General bed mobility comments: Min/mod A for BLE and trunk control    Transfers                   General transfer comment: deferred due to poor static sitting balance with KI donned and nausea    Ambulation/Gait                  Stairs            Wheelchair Mobility     Tilt Bed    Modified Rankin (Stroke Patients Only)       Balance Overall balance assessment: Needs assistance   Sitting balance-Leahy Scale: Poor Sitting balance - Comments: Posterior instability in sitting at the EOB with RLE  KI donned                                     Pertinent Vitals/Pain Pain Assessment Pain Assessment: 0-10 Pain Score: 8  Pain Location: RLE and stomach Pain Descriptors / Indicators: Sore Pain Intervention(s): Premedicated before session, Monitored during session    Home Living Family/patient expects to be discharged to:: Assisted living                 Home Equipment: Wheelchair - manual      Prior Function Prior Level of Function : Needs assist;History of Falls (last six  months)       Physical Assist : Mobility (physical) Mobility (physical): Transfers;Gait   Mobility Comments: Recently limited by NWB UE and falls anxiety; transfers only since November. ADLs Comments: Assist from staff with ADLs     Extremity/Trunk Assessment   Upper Extremity Assessment Upper Extremity Assessment: Generalized weakness    Lower Extremity Assessment Lower Extremity Assessment: Generalized weakness;RLE deficits/detail RLE: Unable to fully assess due to pain;Unable to fully assess due to immobilization       Communication   Communication Communication: Impaired Factors Affecting Communication: Hearing impaired    Cognition Arousal: Alert Behavior During Therapy: WFL for tasks assessed/performed   PT - Cognitive impairments: No family/caregiver present to determine baseline                         Following commands: Intact       Cueing Cueing Techniques: Verbal cues, Visual cues     General Comments      Exercises     Assessment/Plan    PT Assessment Patient needs continued PT services  PT Problem List Decreased strength;Decreased activity tolerance;Decreased balance;Decreased mobility;Decreased range of motion;Decreased safety awareness;Pain       PT Treatment Interventions DME instruction;Gait training;Functional mobility training;Therapeutic activities;Therapeutic exercise;Balance training;Patient/family education    PT Goals (Current goals can be found in the Care Plan section)  Acute Rehab PT Goals Patient Stated Goal: Decreased pain PT Goal Formulation: With patient Time For Goal Achievement: 05/20/24 Potential to Achieve Goals: Fair    Frequency Min 2X/week     Co-evaluation               AM-PAC PT 6 Clicks Mobility  Outcome Measure Help needed turning from your back to your side while in a flat bed without using bedrails?: A Lot Help needed moving from lying on your back to sitting on the side of a flat  bed without using bedrails?: A Lot Help needed moving to and from a bed to a chair (including a wheelchair)?: Total Help needed standing up from a chair using your arms (e.g., wheelchair or bedside chair)?: Total Help needed to walk in hospital room?: Total Help needed climbing 3-5 steps with a railing? : Total 6 Click Score: 8    End of Session   Activity Tolerance: Other (comment) (limited by nausea) Patient left: in bed;with call bell/phone within reach;with bed alarm set;with nursing/sitter in room Nurse Communication: Mobility status PT Visit Diagnosis: Difficulty in walking, not elsewhere classified (R26.2);Muscle weakness (generalized) (M62.81);Pain Pain - Right/Left: Right Pain - part of body: Knee    Time: 8970-8947 PT Time Calculation (min) (ACUTE ONLY): 23 min   Charges:   PT Evaluation $PT Eval Moderate Complexity: 1 Mod   PT General Charges $$ ACUTE PT  VISIT: 1 Visit    D. Scott Jaiel Saraceno PT, DPT 05/07/2024, 11:19 AM

## 2024-05-07 NOTE — Progress Notes (Signed)
 Wound care (ordered daily) to right leg completed today at 6am by night nurse.

## 2024-05-07 NOTE — NC FL2 (Signed)
 " [place]  MEDICAID FL2 LEVEL OF CARE FORM     IDENTIFICATION  Patient Name: Roberta Ross Birthdate: 1933/05/09 Sex: female Admission Date (Current Location): 05/05/2024  [facility] and [place] Number:  Chiropodist and Address:  [facility], [address], [place], [place] [address]      Provider Number: [id number]  Attending Physician Name and Address:  Leesa Kast, DO  Relative Name and Phone Number:       Current Level of Care: Hospital Recommended Level of Care: Skilled Nursing Facility Prior Approval Number:    Date Approved/Denied:   PASRR Number: [id number]  Discharge Plan: SNF    Current Diagnoses: Patient Active Problem List   Diagnosis Date Noted   Infected wound 05/05/2024   Stage 3a chronic kidney disease (HCC) 05/05/2024   Long term current use of systemic steroids 05/05/2024   Chronic nonhealing wound right lower extremity 05/05/2024   Supratherapeutic INR 05/05/2024   HFrEF (heart failure with reduced ejection fraction) (HCC) 05/05/2024   Open wound of right lower extremity 05/05/2024   Anterior dislocation of right shoulder, initial encounter 03/12/2024   Dyslipidemia 03/12/2024   Gout 03/12/2024   Paroxysmal atrial fibrillation with RVR (HCC) 03/12/2024   Chronic obstructive pulmonary disease (COPD) (HCC) 03/12/2024   Depression 03/12/2024   Acute metabolic encephalopathy 02/10/2024   Acute gout 02/09/2024   Acute kidney injury superimposed on CKD 02/08/2024   Pressure injury of skin 02/08/2024   Decubitus ulcer of right buttock, stage 2 (HCC) 02/08/2024   Acute DVT (deep venous thrombosis) (HCC) 02/07/2024   DVT (deep venous thrombosis) (HCC) 01/26/2024   Chronic diastolic CHF (congestive heart failure) (HCC) 05/08/2023   Abdominal pain 02/26/2023   Mycoplasma pneumoniae pneumonia 02/22/2023   COVID-19 virus infection 02/15/2023   COVID 02/15/2023   Rheumatoid arthritis (HCC) 01/24/2022   CAP  (community acquired pneumonia) 01/21/2022   Sepsis (HCC) secondary to community-acquired pneumonia and UTI 01/21/2022   Acute on chronic systolic CHF (congestive heart failure) (HCC) 01/21/2022   UTI (urinary tract infection) 01/21/2022   HLD (hyperlipidemia) 01/21/2022   CKD stage 3b, GFR 30-44 ml/min (HCC) 01/21/2022   Sepsis due to pneumonia (HCC)    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   Closed fracture of femur, intertrochanteric, left, initial encounter (HCC) 03/27/2020   Accidental fall 03/27/2020   Preoperative clearance 03/27/2020   Closed left hip fracture, initial encounter (HCC) 03/27/2020   Simple chronic bronchitis (HCC) 09/01/2018   Atrial fibrillation, chronic (HCC) 09/16/2017   History of subarachnoid hemorrhage 07/21/2017   Prediabetes 03/21/2017   GAD (generalized anxiety disorder) 02/01/2016   Hypercalcemia 02/01/2016   Essential hypertension 01/04/2015   Stage 3b chronic kidney disease (HCC) 08/24/2014   Pure hypercholesterolemia 02/09/2014   H/O rheumatoid arthritis 10/20/2013    Orientation RESPIRATION BLADDER Height & Weight     Self, Time, Situation, Place  Normal Incontinent Weight: 160 lb (72.6 kg) (From O/V note on 04/18/24) Height:     BEHAVIORAL SYMPTOMS/MOOD NEUROLOGICAL BOWEL NUTRITION STATUS   (None)  (None) Continent Diet (2 gram sodium)  AMBULATORY STATUS COMMUNICATION OF NEEDS Skin   Extensive Assist Verbally Bruising, Other (Comment) (Erythema/redness. Wound on left pretibial (no dressing listed), right toe (no dressing listed), right foot (foam), left ankle (foam), buttocks (foam), and right pretibial (impregnated gauze, foam).)                       Personal Care Assistance Level of Assistance  Functional Limitations Info  Sight, Hearing, Speech Sight Info: Adequate Hearing Info: Adequate Speech Info: Adequate    SPECIAL CARE FACTORS FREQUENCY  PT (By licensed PT)     PT Frequency: 5 x week               Contractures Contractures Info: Present    Additional Factors Info  Code Status, Allergies Code Status Info: DNR Allergies Info: Metoprolol, Penicillins, Methotrexate And Trimetrexate, Doxycycline Hyclate           Current Medications (05/07/2024):  This is the current hospital active medication list Current Facility-Administered Medications  Medication Dose Route Frequency Provider Last Rate Last Admin   0.9 %  sodium chloride  infusion   Intravenous Continuous Ward, Kristen N, DO 75 mL/hr at 05/07/24 0057 New Bag at 05/07/24 0057   acetaminophen  (TYLENOL ) tablet 650 mg  650 mg Oral Q6H PRN Duncan, Hazel V, MD   650 mg at 05/05/24 2207   Or   acetaminophen  (TYLENOL ) suppository 650 mg  650 mg Rectal Q6H PRN Cleatus Delayne GAILS, MD       albuterol  (PROVENTIL ) (2.5 MG/3ML) 0.083% nebulizer solution 2.5 mg  2.5 mg Nebulization Q4H PRN Dail Rankin RAMAN, RPH       allopurinol  (ZYLOPRIM ) tablet 50 mg  50 mg Oral Daily Duncan, Hazel V, MD   50 mg at 05/07/24 9063   cefTRIAXone  (ROCEPHIN ) 2 g in sodium chloride  0.9 % 100 mL IVPB  2 g Intravenous Q24H Dezii, Alexandra, DO 200 mL/hr at 05/06/24 1857 2 g at 05/06/24 1857   diltiazem  (CARDIZEM  LA) 24 hr tablet 240 mg  240 mg Oral Daily Kandis Devaughn Sayres, MD   240 mg at 05/07/24 1047   feeding supplement (ENSURE PLUS HIGH PROTEIN) liquid 237 mL  237 mL Oral BID BM Dezii, Alexandra, DO   237 mL at 05/07/24 9062   melatonin tablet 5 mg  5 mg Oral QHS Duncan, Hazel V, MD   5 mg at 05/06/24 2207   Oral care mouth rinse  15 mL Mouth Rinse PRN Wouk, Devaughn Sayres, MD       oxyCODONE  (Oxy IR/ROXICODONE ) immediate release tablet 2.5-5 mg  2.5-5 mg Oral Q6H PRN Duncan, Hazel V, MD       polyethylene glycol (MIRALAX  / GLYCOLAX ) packet 17 g  17 g Oral Daily Duncan, Hazel V, MD   17 g at 05/07/24 0935   predniSONE  (DELTASONE ) tablet 5 mg  5 mg Oral Daily Duncan, Hazel V, MD   5 mg at 05/07/24 9063   prochlorperazine  (COMPAZINE ) injection 10 mg  10 mg  Intravenous Q6H PRN Duncan, Hazel V, MD       sertraline  (ZOLOFT ) tablet 50 mg  50 mg Oral Daily Duncan, Hazel V, MD   50 mg at 05/07/24 0936   simvastatin  (ZOCOR ) tablet 10 mg  10 mg Oral QPM Cleatus Delayne V, MD   10 mg at 05/06/24 1846   vancomycin  (VANCOCIN ) IVPB 1000 mg/200 mL premix  1,000 mg Intravenous Q24H Dail Rankin RAMAN, RPH 200 mL/hr at 05/06/24 2309 1,000 mg at 05/06/24 2309   warfarin (COUMADIN ) tablet 4 mg  4 mg Oral ONCE-1600 Patel, Rutvi M, [facility]       Warfarin - Pharmacist Dosing Inpatient   Does not apply q1600 Kandis Devaughn Sayres, MD   Given at 05/06/24 1456     Discharge Medications: Please see discharge summary for a list of discharge medications.  Relevant Imaging Results:  Relevant Lab Results:  Additional Information SS#: [id number]. Lives at [facility].  Lauraine JAYSON Carpen, LCSW     "

## 2024-05-07 NOTE — Plan of Care (Signed)

## 2024-05-07 NOTE — TOC Initial Note (Signed)
 Transition of Care [facility]) - Initial/Assessment Note    Patient Details  Name: Roberta Ross MRN: [id number] Date of Birth: Jul 16, 1933  Transition of Care [employer]) CM/SW Contact:    Shasta DELENA Daring, RN Phone Number: 05/07/2024, 2:56 PM  Clinical Narrative:                 Readmisison risk assessment complete. FL2 complete.  Patient alone in room. Advised her that I had spoken with her daughter today. Informed patient that the medical team thinks she needs to go to SNF for rehab before she can return to [facility] assisted living. Advised she needs time for her leg to heal.  Patient agreed and is amenable to a bed search. Said she would like to go back to [facility] if she can.    Will follow.  Expected Discharge Plan: Skilled Nursing Facility Barriers to Discharge: Continued Medical Work up   Patient Goals and CMS Choice            Expected Discharge Plan and Services       Living arrangements for the past 2 months: Assisted Living Facility                                      Prior Living Arrangements/Services Living arrangements for the past 2 months: Assisted Living Facility Lives with:: Self Patient language and need for interpreter reviewed:: Yes Do you feel safe going back to the place where you live?: Yes      Need for Family Participation in Patient Care: Yes (Comment) Care giver support system in place?: Yes (comment)   Criminal Activity/Legal Involvement Pertinent to Current Situation/Hospitalization: No - Comment as needed  Activities of Daily Living   ADL Screening (condition at time of admission) Independently performs ADLs?: No Does the patient have a NEW difficulty with bathing/dressing/toileting/self-feeding that is expected to last >3 days?: No Does the patient have a NEW difficulty with getting in/out of bed, walking, or climbing stairs that is expected to last >3 days?: No Does the patient have a NEW difficulty with communication  that is expected to last >3 days?: No Is the patient deaf or have difficulty hearing?: Yes Does the patient have difficulty seeing, even when wearing glasses/contacts?: No Does the patient have difficulty concentrating, remembering, or making decisions?: No  Permission Sought/Granted Permission sought to share information with : Facility Medical Sales Representative, Sports Coach, Family Supports Permission granted to share information with : Yes, Verbal Permission Granted              Emotional Assessment Appearance:: Appears stated age Attitude/Demeanor/Rapport: Gracious Affect (typically observed): Appropriate Orientation: : Oriented to Self, Oriented to Place, Oriented to  Time, Oriented to Situation Alcohol / Substance Use: Not Applicable Psych Involvement: No (comment)  Admission diagnosis:  Infected wound [T14.8XXA, L08.9] Prolonged Q-T interval on ECG [R94.31] Cellulitis of right lower extremity [L03.115] Atrial fibrillation with RVR (HCC) [I48.91] Supratherapeutic INR [R79.1] Open wound of right lower extremity, initial encounter [S81.801A] Patient Active Problem List   Diagnosis Date Noted   Infected wound 05/05/2024   Stage 3a chronic kidney disease (HCC) 05/05/2024   Long term current use of systemic steroids 05/05/2024   Chronic nonhealing wound right lower extremity 05/05/2024   Supratherapeutic INR 05/05/2024   HFrEF (heart failure with reduced ejection fraction) (HCC) 05/05/2024   Open wound of right lower extremity 05/05/2024   Anterior dislocation  of right shoulder, initial encounter 03/12/2024   Dyslipidemia 03/12/2024   Gout 03/12/2024   Paroxysmal atrial fibrillation with RVR (HCC) 03/12/2024   Chronic obstructive pulmonary disease (COPD) (HCC) 03/12/2024   Depression 03/12/2024   Acute metabolic encephalopathy 02/10/2024   Acute gout 02/09/2024   Acute kidney injury superimposed on CKD 02/08/2024   Pressure injury of skin 02/08/2024   Decubitus ulcer of  right buttock, stage 2 (HCC) 02/08/2024   Acute DVT (deep venous thrombosis) (HCC) 02/07/2024   DVT (deep venous thrombosis) (HCC) 01/26/2024   Chronic diastolic CHF (congestive heart failure) (HCC) 05/08/2023   Abdominal pain 02/26/2023   Mycoplasma pneumoniae pneumonia 02/22/2023   COVID-19 virus infection 02/15/2023   COVID 02/15/2023   Rheumatoid arthritis (HCC) 01/24/2022   CAP (community acquired pneumonia) 01/21/2022   Sepsis (HCC) secondary to community-acquired pneumonia and UTI 01/21/2022   Acute on chronic systolic CHF (congestive heart failure) (HCC) 01/21/2022   UTI (urinary tract infection) 01/21/2022   HLD (hyperlipidemia) 01/21/2022   CKD stage 3b, GFR 30-44 ml/min (HCC) 01/21/2022   Sepsis due to pneumonia [facility])    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   Closed fracture of femur, intertrochanteric, left, initial encounter (HCC) 03/27/2020   Accidental fall 03/27/2020   Preoperative clearance 03/27/2020   Closed left hip fracture, initial encounter (HCC) 03/27/2020   Simple chronic bronchitis (HCC) 09/01/2018   Atrial fibrillation, chronic (HCC) 09/16/2017   History of subarachnoid hemorrhage 07/21/2017   Prediabetes 03/21/2017   GAD (generalized anxiety disorder) 02/01/2016   Hypercalcemia 02/01/2016   Essential hypertension 01/04/2015   Stage 3b chronic kidney disease (HCC) 08/24/2014   Pure hypercholesterolemia 02/09/2014   H/O rheumatoid arthritis 10/20/2013   PCP:  Lenon Layman ORN, MD Pharmacy:   [id number] [place], [place] - [address] AT [facility] [address] [place] [address] Phone: [phone] Fax: [phone]  [facility] [address] [place] [address] Phone: [phone] Fax: [phone]     Social Drivers of Health (SDOH) Social History: SDOH Screenings   Food Insecurity: No Food Insecurity (05/05/2024)  Housing:  Low Risk (05/05/2024)  Transportation Needs: No Transportation Needs (05/05/2024)  Utilities: Not At Risk (05/05/2024)  Social Connections: Moderately Isolated (05/05/2024)  Tobacco Use: Medium Risk (05/04/2024)   SDOH Interventions:     Readmission Risk Interventions    05/07/2024    2:51 PM  Readmission Risk Prevention Plan  Transportation Screening Complete  Medication Review (RN Care Manager) Complete  PCP or Specialist appointment within 3-5 days of discharge --  Palliative Care Screening Not Applicable  Skilled Nursing Facility Complete

## 2024-05-07 NOTE — Consult Note (Signed)
 PHARMACY - ANTICOAGULATION CONSULT NOTE  Pharmacy Consult for warfarin dosing Indication: atrial fibrillation  Allergies[1]  Patient Measurements: Weight: 72.6 kg (160 lb) (From O/V note on 04/18/24)  Vital Signs: Temp: 98 F (36.7 C) (01/08 0314) BP: 100/60 (01/08 0314) Pulse Rate: 70 (01/08 0314)  Labs: Recent Labs    05/04/24 1749 05/05/24 0139 05/06/24 0435 05/07/24 0419  HGB 11.6*  --  8.9* 8.8*  HCT 36.5  --  28.8* 28.7*  PLT 382  --  291 276  LABPROT  --  57.7* 47.1* 26.6*  INR  --  6.2* 4.8* 2.3*  CREATININE 1.00  --  0.97 0.83    Estimated Creatinine Clearance: 47.8 mL/min (by C-G formula based on SCr of 0.83 mg/dL).   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 Past medical history notable for CKD 3a, gout, systolic CHF, dementia, hypertension, hyperlipidemia, DVT, A-fib on Coumadin , COPD, chronic kidney disease, and rheumatoid arthritis on prednisone  daily.  1/6 AM: Patient's INR 6.2, supratherapeutic. There are various potential explanations for the supratherapeutic level including age (>89yo), interaction with home allopurinol , interaction with recent clindamycin  prescription from 12/30, and the potential increased bleed risk with the chronic nonhealing wound.   Patient without symptoms and INR not due to a recent procedure. Per hospitalist, plan to hold off of oral vitamin K and check INR in the AM.  Hgb in line with  historical baseline. Plt stable. Albumin wnl.  Date  Time   INR   Comment 1/6     0139   6.2     SUPRAtherapeutic 1/7     0435   4.8     SUPRAtherapeutic, CBC w/ Hgb 8.9 and PLTs 291 1/8     0419   2.3     therapeutic x1   Goal of Therapy:  INR 2-3 Monitor platelets by anticoagulation protocol: Yes   Plan:  INR within therapeutic range x 1 Will order Warfarin 4 mg PO x 1, which is patient's home dose  Will continue to monitor INR daily until stable  Monitor CBC at least every 3 days    Ransom Blanch PGY-1 Pharmacy Resident  [facility]  05/07/2024 7:55 AM         [1]  Allergies Allergen Reactions   Metoprolol Shortness Of Breath   Penicillins Other (See Comments)    Internal swelling including throat TOLERATES CEFTRIAXONE    Methotrexate And Trimetrexate Nausea Only   Doxycycline Hyclate Rash

## 2024-05-08 DIAGNOSIS — L089 Local infection of the skin and subcutaneous tissue, unspecified: Secondary | ICD-10-CM | POA: Diagnosis not present

## 2024-05-08 DIAGNOSIS — L98493 Non-pressure chronic ulcer of skin of other sites with necrosis of muscle: Secondary | ICD-10-CM | POA: Diagnosis not present

## 2024-05-08 LAB — PROTIME-INR
INR: 1.5 — ABNORMAL HIGH (ref 0.8–1.2)
Prothrombin Time: 19.2 s — ABNORMAL HIGH (ref 11.4–15.2)

## 2024-05-08 MED ORDER — WARFARIN SODIUM 6 MG PO TABS
6.0000 mg | ORAL_TABLET | Freq: Once | ORAL | Status: AC
  Administered 2024-05-08: 6 mg via ORAL
  Filled 2024-05-08: qty 1

## 2024-05-08 NOTE — Consult Note (Addendum)
 PHARMACY - ANTICOAGULATION CONSULT NOTE  Pharmacy Consult for warfarin dosing Indication: atrial fibrillation  Allergies[1]  Patient Measurements: Weight: 72.6 kg (160 lb) (From O/V note on 04/18/24)  Vital Signs: Temp: 97.5 F (36.4 C) (01/09 0907) Temp Source: Oral (01/09 0006) BP: 121/62 (01/09 0907) Pulse Rate: 78 (01/09 0907)  Labs: Recent Labs    05/06/24 0435 05/07/24 0419 05/07/24 1205  HGB 8.9* 8.8*  --   HCT 28.8* 28.7*  --   PLT 291 276  --   LABPROT 47.1* 26.6* 22.9*  INR 4.8* 2.3* 1.9*  CREATININE 0.97 0.83  --     Estimated Creatinine Clearance: 47.8 mL/min (by C-G formula based on SCr of 0.83 mg/dL).   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 Past medical history notable for CKD 3a, gout, systolic CHF, dementia, hypertension, hyperlipidemia, DVT, A-fib on Coumadin , COPD, chronic kidney disease, and rheumatoid arthritis on prednisone  daily.  1/6 AM: Patient's INR 6.2, supratherapeutic. There are various potential explanations for the supratherapeutic level including age (>89yo), interaction with home allopurinol , interaction with recent clindamycin  prescription from 12/30, and the potential increased bleed risk with the chronic nonhealing wound.   Patient without symptoms and INR not due to a recent procedure. Per hospitalist, plan to hold off of oral vitamin K and check INR in the AM.  Hgb in line with historical  baseline. Plt stable. Albumin wnl.  Date  Time   INR   Comment 1/6     0139   6.2     SUPRAtherapeutic 1/7     0435   4.8     SUPRAtherapeutic, CBC w/ Hgb 8.9 and PLTs 291 1/8     0419   2.3     therapeutic x1 1/9     1024   1.5     SUBtherapeutic    Goal of Therapy:  INR 2-3 Monitor platelets by anticoagulation protocol: Yes   Plan:  INR SUBtherapeutic this AM Will order Warfarin 6 mg PO x 1 No concern for bridging right now due to CHadsVasc score of 6 (< 7), no hx of Afib or TIA Will continue to monitor INR daily until stable  Monitor CBC at least every 3 days    Ransom Blanch PGY-1 Pharmacy Resident  [facility]  05/08/2024 11:24 AM         [1]  Allergies Allergen Reactions   Metoprolol Shortness Of Breath   Penicillins Other (See Comments)    Internal swelling including throat TOLERATES CEFTRIAXONE    Methotrexate And Trimetrexate Nausea Only   Doxycycline Hyclate Rash

## 2024-05-08 NOTE — Progress Notes (Signed)
 Physical Therapy Treatment Patient Details Name: Roberta Ross MRN: [id number] DOB: [birth year] Today's Date: 05/08/2024   History of Present Illness Pt is an 89yoF who comes [facility] on 05/04/24 from [facility] with a poorly healing odorous wound on Right leg. PMH: AF on warfarin, CKD3a, gout, HTN, sCHF, COPD, DVT, RA on daily prednisone . In ED pt went into RVR in the 130s with a drop in her O2 sat to 88%. Pt last seen by our services in Nov 2025 after closed reduction left GHJ dislocation. Most recent FU with ortho on 05/01/24 recs for Rt rTSA given RC tear and risk for recurrent dislocation, pt declines. Pt has a frequent falls history. As of ortho FU on 05/01/24 pt has WBAT ad lib use of RUE.  Per MRI impression subcortical stress fracture of the posterior medial tibial plateau. MD assessment also includes: chronic nonhealing wound right lower extremity, acute on chronic anemia, and generalized deconditioning.    PT Comments  Pt was pleasant and motivated to participate during the session and put forth good effort throughout. KI donned to RLE prior to attempting sitting at EOB.  Pt put forth good effort with bed mobility tasks but continued to require physical assist for BLE and trunk management.  Once in sitting pt only able to tolerate around 45-60 sec before becoming very SOB and requesting return to supine, nursing notified.  Pt's SOB improved quickly once back in supine with SpO2 and HR WNL on room air.  Pt put forth good effort with some supine therex per below with no adverse symptoms.  Pt will benefit from continued PT services upon discharge to safely address deficits listed in patient problem list for decreased caregiver assistance and eventual return to PLOF.      If plan is discharge home, recommend the following: Two people to help with walking and/or transfers;A lot of help with bathing/dressing/bathroom;Assistance with cooking/housework;Direct supervision/assist for medications  management;Assist for transportation   Can travel by private vehicle     No  Equipment Recommendations  Other (comment) (TBD)    Recommendations for Other Services       Precautions / Restrictions Precautions Precautions: Fall Required Braces or Orthoses: Knee Immobilizer - Right Knee Immobilizer - Right: On when out of bed or walking Restrictions Weight Bearing Restrictions Per Provider Order: Yes RUE Weight Bearing Per Provider Order: Weight bearing as tolerated RLE Weight Bearing Per Provider Order: Non weight bearing     Mobility  Bed Mobility Overal bed mobility: Needs Assistance Bed Mobility: Supine to Sit, Sit to Supine     Supine to sit: Min assist, Mod assist Sit to supine: Mod assist   General bed mobility comments: Min/mod A for BLE and trunk management with cuing for sequencing    Transfers                   General transfer comment: deferred due to poor static sitting balance as well as SOB in sitting with pt requesting to return to supine    Ambulation/Gait                   Stairs             Wheelchair Mobility     Tilt Bed    Modified Rankin (Stroke Patients Only)       Balance Overall balance assessment: Needs assistance   Sitting balance-Leahy Scale: Poor Sitting balance - Comments: Posterior instability in sitting at the EOB with RLE KI  donned                                    Communication Communication Communication: Impaired Factors Affecting Communication: Hearing impaired  Cognition Arousal: Alert Behavior During Therapy: WFL for tasks assessed/performed   PT - Cognitive impairments: No family/caregiver present to determine baseline                         Following commands: Intact      Cueing Cueing Techniques: Verbal cues, Visual cues, Tactile cues  Exercises Total Joint Exercises Ankle Circles/Pumps: AROM, Strengthening, Both, 10 reps, 15 reps Hip  ABduction/ADduction: AROM, AAROM, Strengthening, Both, 10 reps Straight Leg Raises: AROM, AAROM, Strengthening, Both, 10 reps    General Comments        Pertinent Vitals/Pain Pain Assessment Pain Assessment: No/denies pain    Home Living                          Prior Function            PT Goals (current goals can now be found in the care plan section) Progress towards PT goals: Not progressing toward goals - comment (Unable to maintain static sitting at EOB due to nausea prior session and SOB this session)    Frequency    Min 2X/week      PT Plan      Co-evaluation              AM-PAC PT 6 Clicks Mobility   Outcome Measure  Help needed turning from your back to your side while in a flat bed without using bedrails?: A Lot Help needed moving from lying on your back to sitting on the side of a flat bed without using bedrails?: A Lot Help needed moving to and from a bed to a chair (including a wheelchair)?: Total Help needed standing up from a chair using your arms (e.g., wheelchair or bedside chair)?: Total Help needed to walk in hospital room?: Total Help needed climbing 3-5 steps with a railing? : Total 6 Click Score: 8    End of Session   Activity Tolerance: Other (comment) (Limited by SOB in sitting) Patient left: in bed;with call bell/phone within reach;with bed alarm set Nurse Communication: Mobility status;Other (comment) (Nursing notified of pt's SOB while in sitting at EOB) PT Visit Diagnosis: Difficulty in walking, not elsewhere classified (R26.2);Muscle weakness (generalized) (M62.81);Pain Pain - Right/Left: Right Pain - part of body: Knee     Time: 1540-1602 PT Time Calculation (min) (ACUTE ONLY): 22 min  Charges:    $Therapeutic Exercise: 8-22 mins PT General Charges $$ ACUTE PT VISIT: 1 Visit                     D. Glendia Bertin PT, DPT 05/08/2024, 4:14 PM

## 2024-05-08 NOTE — TOC Progression Note (Signed)
 Transition of Care [facility]) - Progression Note    Patient Details  Name: Roberta Ross MRN: [id number] Date of Birth: 07/21/1933  Transition of Care [facility]) CM/SW Contact  Shasta DELENA Daring, RN Phone Number: 05/08/2024, 2:58 PM  Clinical Narrative:    RNCM spoke to patient's daughter and reviewed SNF options. Discussed [name], [facility] and [facility]. Daughter said their preference would be [facility].   Contacted [facility] rep. They can/will offer patient a bed on Monday, but only if she pays her co-pay up front. $218 per day with 14 days due upon admission.  Notified daughter of cost and she wanted to talk to her brother first. Advised her to call the bedside RN and ask to speak with case management over the weekend.     Expected Discharge Plan: Skilled Nursing Facility Barriers to Discharge: Continued Medical Work up               Expected Discharge Plan and Services       Living arrangements for the past 2 months: Assisted Living Facility                                       Social Drivers of Health (SDOH) Interventions SDOH Screenings   Food Insecurity: No Food Insecurity (05/05/2024)  Housing: Low Risk (05/05/2024)  Transportation Needs: No Transportation Needs (05/05/2024)  Utilities: Not At Risk (05/05/2024)  Social Connections: Moderately Isolated (05/05/2024)  Tobacco Use: Medium Risk (05/04/2024)    Readmission Risk Interventions    05/07/2024    2:51 PM  Readmission Risk Prevention Plan  Transportation Screening Complete  Medication Review (RN Care Manager) Complete  PCP or Specialist appointment within 3-5 days of discharge --  Palliative Care Screening Not Applicable  Skilled Nursing Facility Complete

## 2024-05-08 NOTE — Progress Notes (Signed)
 " PROGRESS NOTE    Roberta Ross  FMW:[id number] DOB: [birth year] DOA: 05/05/2024 PCP: Lenon Layman ORN, MD  Chief Complaint  Patient presents with   Wound Infection    Hospital Course:  Roberta Ross is an 89 year old female with A-fib on warfarin, CKD stage IIIa, gout, hypertension, systolic CHF, COPD, femoral DVT in September 2025, rheumatoid arthritis on daily prednisone , with a chronic nonhealing wound to the right lower leg after an injury in October.  She was admitted due to concerns of infection of this chronic wound and incidentally found to be rapid A-fib during ED workup. Prior to arrival her facility started her on clindamycin  due to foul-smelling wound for 1 week. In the ED she was A-fib RVR in the 130s, O2 dropped to 88%.  She was started on diltiazem . She was started on cefepime  and vancomycin  for wound infection.  Cultures are growing Klebsiella without resistance.  Antibiotic therapy was narrowed to ceftriaxone . Stay further complicated by newly found stress fracture of the right tibial plateau.  Patient was placed in knee immobilizer and made nonweightbearing.  Due to this change in her ambulatory status they recommended for SNF placement.  TOC is working on this  Subjective: Bedside RN reports bloody bowel movements this morning.  She does note that the patient has visible hemorrhoids. The patient reports that she has had previously bleeding hemorrhoids but it has been many many years since she has had this issue. We discussed that her hemoglobin is already going down and if the bleeding continues she may require blood transfusion.  She does have some hesitations about this.  We discussed her hesitation she requested more time to consider this option and to follow-up with her regarding the repeat hemoglobin in a.m. I also discussed with her that if she continues to have bleeding from the rectum we could consult gastroenterology and consider colonoscopy.   She would prefer to avoid this.  Objective: Vitals:   05/08/24 0006 05/08/24 0319 05/08/24 0907 05/08/24 1233  BP: 114/79 111/60 121/62 127/82  Pulse: 87 84 78 85  Resp: 18 (!) 22 15 16   Temp: 97.8 F (36.6 C) (!) 97.4 F (36.3 C) (!) 97.5 F (36.4 C) 97.7 F (36.5 C)  TempSrc: Oral  Oral   SpO2: 97% 96% 96% 96%  Weight:        Intake/Output Summary (Last 24 hours) at 05/08/2024 1500 Last data filed at 05/08/2024 0900 Gross per 24 hour  Intake 120 ml  Output 1450 ml  Net -1330 ml   Filed Weights   05/05/24 0127  Weight: 72.6 kg    Examination: General exam: Appears calm and comfortable, NAD  Respiratory system: No work of breathing, symmetric chest wall expansion Cardiovascular system: S1 & S2 heard, RRR.  Gastrointestinal system: Abdomen is nondistended, soft and nontender.  Neuro: Alert and oriented. No focal neurological deficits. Extremities: Leg wound freshly bandaged c/d/i.  Assessment & Plan:  Principal Problem:   Chronic nonhealing wound right lower extremity Active Problems:   Infected wound   Paroxysmal atrial fibrillation with RVR (HCC)   Supratherapeutic INR   Chronic obstructive pulmonary disease (COPD) (HCC)   Essential hypertension   Rheumatoid arthritis (HCC)   DVT (deep venous thrombosis) (HCC)   Gout   Stage 3a chronic kidney disease (HCC)   Long term current use of systemic steroids   HFrEF (heart failure with reduced ejection fraction) (HCC)   Open wound of right lower extremity    Chronic nonhealing wound  right lower extremity Infected hematoma chronic wound - MRI right lower extremity: Collection of fluid underlying the investing fascia of the lateral anterior posterior compartment measuring 7.2 cm x 1 cm x 10 cm which may represent an infected hematoma given the history of prior trauma. - She was also noted to have subcutaneous edema throughout the calf. - No evidence of acute osteomyelitis on this MRI - Vascular surgery was consulted  and recommends Unna boot wrappings to the right lower extremity to help with compression and reduce some swelling.  Operative debriding not recommended at this time. - Cultures revealed Klebsiella sensitive to all except ampicillin, as well as Enterococcus faecalis. - In light of her penicillin allergy antibiotic selection has been difficult.  I have discussed with ID Pharm.  For now we are planning to proceed with ceftriaxone  and linezolid  to complete a 7-day course - Blood cultures remain negative  Subcortical stress fracture of the posterior medial tibial plateau - Incidentally seen on lower extremity MRI.  Dedicated MRI also reveals complex degenerative tear of posterior horn of medial meniscus and degenerative changes of the posterior horn root and lateral meniscus without a discrete tear..  Regional muscular atrophy suggesting sarcopenia - Discussed with orthopedic surgery who recommends knee immobilizer and nonweightbearing.  I have updated PT as to her new findings - PT/OT now recommending SNF.  TOC aware working on these arrangements  Therapeutic INR - INR returned at 6.2.  No active bleeding appreciated outside of hematoma in leg - Pharmacy consulted, appreciate medication adjustments  - INR gradually downtrending - Of note patient was previously on apixaban  but had DVT thought to be apixaban  failure  Bloody bowel movements - Likely secondary to hemorrhoidal bleed.  Repeat CBC in a.m. - Presently patient is refusing blood transfusion and colonoscopy but reports she is open for discussion if the situation becomes more urgent.  Will continue to monitor closely.  Paroxysmal A-fib with RVR - Continue with diltiazem  - Warfarin as above  Acute on chronic anemia - Acute worsening secondary to the large hematoma and now bloody bowel movements - Repeat CBC in a.m. - As above currently refusing blood transfusion but she will reevaluate if hemoglobin continues to drop  COPD, not currently  in exacerbation - Continue home inhalers  Stage IIIa CKD - Kidney function at baseline - Avoid nephrotoxics  Gout - Resume home meds  Rheumatoid arthritis Immunosuppression - On chronic steroid therapy.  Generalized deconditioning - Resides at assisted living facility - PT/OT recommending SNF.  TOC working on arrangement  Body mass index is 25.82 kg/m. - Outpatient follow up for lifestyle modification and risk factor management   DVT prophylaxis: Warfarin   Code Status: Limited: Do not attempt resuscitation (DNR) -DNR-LIMITED -Do Not Intubate/DNI  Disposition: Needs SNF  Consultants:  Treatment Team:  Consulting Physician: Tobie Priest, DO  Procedures:    Antimicrobials:  Anti-infectives (From admission, onward)    Start     Dose/Rate Route Frequency Ordered Stop   05/07/24 1800  linezolid  (ZYVOX ) tablet 600 mg        600 mg Oral Every 12 hours 05/07/24 1650 05/14/24 2159   05/06/24 1800  cefTRIAXone  (ROCEPHIN ) 2 g in sodium chloride  0.9 % 100 mL IVPB        2 g 200 mL/hr over 30 Minutes Intravenous Every 24 hours 05/06/24 1642 05/13/24 1759   05/06/24 0000  vancomycin  (VANCOCIN ) IVPB 1000 mg/200 mL premix  Status:  Discontinued  1,000 mg 200 mL/hr over 60 Minutes Intravenous Every 24 hours 05/05/24 0401 05/07/24 1547   05/05/24 1200  cefTRIAXone  (ROCEPHIN ) 1 g in sodium chloride  0.9 % 100 mL IVPB  Status:  Discontinued        1 g 200 mL/hr over 30 Minutes Intravenous Every 24 hours 05/05/24 0315 05/05/24 0847   05/05/24 0130  ceFEPIme  (MAXIPIME ) 2 g in sodium chloride  0.9 % 100 mL IVPB        2 g 200 mL/hr over 30 Minutes Intravenous  Once 05/05/24 0122 05/05/24 0134   05/05/24 0130  vancomycin  (VANCOREADY) IVPB 1750 mg/350 mL        1,750 mg 175 mL/hr over 120 Minutes Intravenous  Once 05/05/24 0128 05/05/24 0445       Data Reviewed: I have personally reviewed following labs and imaging studies CBC: Recent Labs  Lab 05/04/24 1749 05/06/24 0435  05/07/24 0419  WBC 10.7* 6.6 7.3  HGB 11.6* 8.9* 8.8*  HCT 36.5 28.8* 28.7*  MCV 95.3 98.0 97.6  PLT 382 291 276   Basic Metabolic Panel: Recent Labs  Lab 05/04/24 1749 05/05/24 0138 05/06/24 0435 05/07/24 0419  NA 139  --  140 140  K 3.8  --  3.6 3.7  CL 100  --  106 108  CO2 29  --  26 25  GLUCOSE 131*  --  92 121*  BUN 20  --  15 15  CREATININE 1.00  --  0.97 0.83  CALCIUM 10.4*  --  9.3 9.7  MG  --  2.2  --   --    GFR: Estimated Creatinine Clearance: 47.8 mL/min (by C-G formula based on SCr of 0.83 mg/dL). Liver Function Tests: Recent Labs  Lab 05/07/24 0419  AST 10*  ALT 6  ALKPHOS 76  BILITOT <0.2  PROT 5.3*  ALBUMIN 2.7*   CBG: No results for input(s): GLUCAP in the last 168 hours.  Recent Results (from the past 240 hours)  Blood culture (routine x 2)     Status: None (Preliminary result)   Collection Time: 05/05/24  1:25 AM   Specimen: BLOOD  Result Value Ref Range Status   Specimen Description BLOOD LEFT ANTECUBITAL  Final   Special Requests   Final    BOTTLES DRAWN AEROBIC AND ANAEROBIC Blood Culture results may not be optimal due to an inadequate volume of blood received in culture bottles   Culture   Final    NO GROWTH 3 DAYS Performed at [facility], [address], [place], [place] [address]    Report Status PENDING  Incomplete  Blood culture (routine x 2)     Status: None (Preliminary result)   Collection Time: 05/05/24  1:39 AM   Specimen: BLOOD  Result Value Ref Range Status   Specimen Description BLOOD BLOOD LEFT ARM  Final   Special Requests   Final    BOTTLES DRAWN AEROBIC AND ANAEROBIC Blood Culture results may not be optimal due to an inadequate volume of blood received in culture bottles   Culture   Final    NO GROWTH 3 DAYS Performed at [facility], [address] [place], [place] [address]    Report Status PENDING  Incomplete  Aerobic/Anaerobic Culture w Gram Stain (surgical/deep wound)     Status:  None (Preliminary result)   Collection Time: 05/05/24  1:39 AM   Specimen: Leg; Wound  Result Value Ref Range Status   Specimen Description   Final  LEG Performed at [facility], [address] [place], [place] [address]    Special Requests   Final    NONE Performed at [facility], [address] [place], [place] [address]    Gram Stain   Final    FEW WBC PRESENT, PREDOMINANTLY PMN FEW GRAM POSITIVE COCCI Performed at [facility], [address] [place], [place] [address]    Culture   Final    MODERATE KLEBSIELLA PNEUMONIAE MODERATE ENTEROCOCCUS FAECALIS SUSCEPTIBILITIES TO FOLLOW NO ANAEROBES ISOLATED; CULTURE IN PROGRESS FOR 5 DAYS    Report Status PENDING  Incomplete   Organism ID, Bacteria KLEBSIELLA PNEUMONIAE  Final      Susceptibility   Klebsiella pneumoniae - MIC*    AMPICILLIN RESISTANT Resistant     CEFAZOLIN (NON-URINE) <=1 SENSITIVE Sensitive     CEFEPIME  <=0.12 SENSITIVE Sensitive     ERTAPENEM <=0.12 SENSITIVE Sensitive     CEFTRIAXONE  <=0.25 SENSITIVE Sensitive     CIPROFLOXACIN <=0.06 SENSITIVE Sensitive     GENTAMICIN <=1 SENSITIVE Sensitive     MEROPENEM <=0.25 SENSITIVE Sensitive     TRIMETH/SULFA <=20 SENSITIVE Sensitive     AMPICILLIN/SULBACTAM 4 SENSITIVE Sensitive     PIP/TAZO Value in next row Sensitive      <=4 SENSITIVEThis is a modified FDA-approved test that has been validated and its performance characteristics determined by the reporting laboratory.  This laboratory is certified under the Clinical Laboratory Improvement Amendments CLIA as qualified to perform high complexity clinical laboratory testing.    * MODERATE KLEBSIELLA PNEUMONIAE  Resp panel by RT-PCR (RSV, Flu A&B, Covid) Anterior Nasal Swab     Status: None   Collection Time: 05/05/24  9:12 AM   Specimen: Anterior Nasal Swab  Result Value Ref Range Status   SARS Coronavirus 2 by RT PCR NEGATIVE NEGATIVE Final    Comment: (NOTE) SARS-CoV-2 target  nucleic acids are NOT DETECTED.  The SARS-CoV-2 RNA is generally detectable in upper respiratory specimens during the acute phase of infection. The lowest concentration of SARS-CoV-2 viral copies this assay can detect is 138 copies/mL. A negative result does not preclude SARS-Cov-2 infection and should not be used as the sole basis for treatment or other patient management decisions. A negative result may occur with  improper specimen collection/handling, submission of specimen other than nasopharyngeal swab, presence of viral mutation(s) within the areas targeted by this assay, and inadequate number of viral copies(<138 copies/mL). A negative result must be combined with clinical observations, patient history, and epidemiological information. The expected result is Negative.  Fact Sheet for Patients:  bloggercourse.com  Fact Sheet for Healthcare Providers:  seriousbroker.it  This test is no t yet approved or cleared by the [place]  FDA and  has been authorized for detection and/or diagnosis of SARS-CoV-2 by FDA under an Emergency Use Authorization (EUA). This EUA will remain  in effect (meaning this test can be used) for the duration of the COVID-19 declaration under Section 564(b)(1) of the Act, 21 U.S.C.section 360bbb-3(b)(1), unless the authorization is terminated  or revoked sooner.       Influenza A by PCR NEGATIVE NEGATIVE Final   Influenza B by PCR NEGATIVE NEGATIVE Final    Comment: (NOTE) The Xpert Xpress SARS-CoV-2/FLU/RSV plus assay is intended as an aid in the diagnosis of influenza from Nasopharyngeal swab specimens and should not be used as a sole basis for treatment. Nasal washings and aspirates are unacceptable for Xpert Xpress SARS-CoV-2/FLU/RSV testing.  Fact Sheet for  Patients: bloggercourse.com  Fact Sheet for Healthcare  Providers: seriousbroker.it  This test is not yet approved or cleared by the [place]  FDA and has been authorized for detection and/or diagnosis of SARS-CoV-2 by FDA under an Emergency Use Authorization (EUA). This EUA will remain in effect (meaning this test can be used) for the duration of the COVID-19 declaration under Section 564(b)(1) of the Act, 21 U.S.C. section 360bbb-3(b)(1), unless the authorization is terminated or revoked.     Resp Syncytial Virus by PCR NEGATIVE NEGATIVE Final    Comment: (NOTE) Fact Sheet for Patients: bloggercourse.com  Fact Sheet for Healthcare Providers: seriousbroker.it  This test is not yet approved or cleared by the [place]  FDA and has been authorized for detection and/or diagnosis of SARS-CoV-2 by FDA under an Emergency Use Authorization (EUA). This EUA will remain in effect (meaning this test can be used) for the duration of the COVID-19 declaration under Section 564(b)(1) of the Act, 21 U.S.C. section 360bbb-3(b)(1), unless the authorization is terminated or revoked.  Performed at [facility], [address] [place], [place] [address]   MRSA Next Gen by PCR, Nasal     Status: None   Collection Time: 05/06/24  4:43 PM   Specimen: Nasal Mucosa; Nasal Swab  Result Value Ref Range Status   MRSA by PCR Next Gen NOT DETECTED NOT DETECTED Final    Comment: (NOTE) The GeneXpert MRSA Assay (FDA approved for NASAL specimens only), is one component of a comprehensive MRSA colonization surveillance program. It is not intended to diagnose MRSA infection nor to guide or monitor treatment for MRSA infections. Test performance is not FDA approved in patients less than 48 years old. Performed at [facility], [address] [place], [place] [address]      Radiology Studies: No results found.   Scheduled Meds:  allopurinol   50 mg Oral  Daily   diltiazem   240 mg Oral Daily   feeding supplement  237 mL Oral BID BM   linezolid   600 mg Oral Q12H   melatonin  5 mg Oral QHS   polyethylene glycol  17 g Oral Daily   predniSONE   5 mg Oral Daily   sertraline   50 mg Oral Daily   simvastatin   10 mg Oral QPM   warfarin  6 mg Oral ONCE-1600   Warfarin - Pharmacist Dosing Inpatient   Does not apply q1600   Continuous Infusions:  sodium chloride  75 mL/hr at 05/08/24 0422   cefTRIAXone  (ROCEPHIN )  IV 2 g (05/07/24 1819)     LOS: 3 days  MDM: Patient is high risk for one or more organ failure.  They necessitate ongoing hospitalization for continued IV therapies and subsequent lab monitoring. Total time spent interpreting labs and vitals, reviewing the medical record, coordinating care amongst consultants and care team members, directly assessing and discussing care with the patient and/or family: 55 min  Meaghann Choo, DO [facility]  To contact the attending physician between 7A-7P please use Epic Chat. To contact the covering physician during after hours 7P-7A, please review Amion.  05/08/2024, 3:00 PM   *This document has been created with the assistance of dictation software. Please excuse typographical errors. *   "

## 2024-05-08 NOTE — Care Management Important Message (Signed)
 Important Message  Patient Details  Name: Roberta Ross MRN: [id number] Date of Birth: [birth year]   Important Message Given:  Yes - Medicare IM     [name] 05/08/2024, 3:20 PM

## 2024-05-08 NOTE — Plan of Care (Signed)

## 2024-05-09 DIAGNOSIS — L089 Local infection of the skin and subcutaneous tissue, unspecified: Secondary | ICD-10-CM | POA: Diagnosis not present

## 2024-05-09 DIAGNOSIS — L98493 Non-pressure chronic ulcer of skin of other sites with necrosis of muscle: Secondary | ICD-10-CM | POA: Diagnosis not present

## 2024-05-09 LAB — COMPREHENSIVE METABOLIC PANEL WITH GFR
ALT: 8 U/L (ref 0–44)
AST: 13 U/L — ABNORMAL LOW (ref 15–41)
Albumin: 2.8 g/dL — ABNORMAL LOW (ref 3.5–5.0)
Alkaline Phosphatase: 81 U/L (ref 38–126)
Anion gap: 7 (ref 5–15)
BUN: 13 mg/dL (ref 8–23)
CO2: 24 mmol/L (ref 22–32)
Calcium: 10 mg/dL (ref 8.9–10.3)
Chloride: 109 mmol/L (ref 98–111)
Creatinine, Ser: 0.8 mg/dL (ref 0.44–1.00)
GFR, Estimated: >60 mL/min
Glucose, Bld: 94 mg/dL (ref 70–99)
Potassium: 3.8 mmol/L (ref 3.5–5.1)
Sodium: 140 mmol/L (ref 135–145)
Total Bilirubin: 0.3 mg/dL (ref 0.0–1.2)
Total Protein: 5.4 g/dL — ABNORMAL LOW (ref 6.5–8.1)

## 2024-05-09 LAB — CBC
HCT: 29.3 % — ABNORMAL LOW (ref 36.0–46.0)
Hemoglobin: 9.4 g/dL — ABNORMAL LOW (ref 12.0–15.0)
MCH: 30.3 pg (ref 26.0–34.0)
MCHC: 32.1 g/dL (ref 30.0–36.0)
MCV: 94.5 fL (ref 80.0–100.0)
Platelets: 297 K/uL (ref 150–400)
RBC: 3.1 MIL/uL — ABNORMAL LOW (ref 3.87–5.11)
RDW: 13.9 % (ref 11.5–15.5)
WBC: 8 K/uL (ref 4.0–10.5)
nRBC: 0 % (ref 0.0–0.2)

## 2024-05-09 LAB — PHOSPHORUS: Phosphorus: 2.2 mg/dL — ABNORMAL LOW (ref 2.5–4.6)

## 2024-05-09 LAB — MAGNESIUM: Magnesium: 2 mg/dL (ref 1.7–2.4)

## 2024-05-09 LAB — PROTIME-INR
INR: 1.7 — ABNORMAL HIGH (ref 0.8–1.2)
Prothrombin Time: 20.5 s — ABNORMAL HIGH (ref 11.4–15.2)

## 2024-05-09 MED ORDER — WARFARIN SODIUM 5 MG PO TABS
5.0000 mg | ORAL_TABLET | Freq: Once | ORAL | Status: AC
  Administered 2024-05-09: 5 mg via ORAL
  Filled 2024-05-09: qty 1

## 2024-05-09 NOTE — Progress Notes (Signed)
 " PROGRESS NOTE    Roberta Ross  FMW:[id number] DOB: Oct 29, 1933 DOA: 05/05/2024 PCP: Lenon Layman ORN, MD  Chief Complaint  Patient presents with   Wound Infection    Hospital Course:  Roberta Ross is an 89 year old female with A-fib on warfarin, CKD stage IIIa, gout, hypertension, systolic CHF, COPD, femoral DVT in September 2025, rheumatoid arthritis on daily prednisone , with a chronic nonhealing wound to the right lower leg after an injury in October.  She was admitted due to concerns of infection of this chronic wound and incidentally found to be rapid A-fib during ED workup. Prior to arrival her facility started her on clindamycin  due to foul-smelling wound for 1 week. In the ED she was A-fib RVR in the 130s, O2 dropped to 88%.  She was started on diltiazem . She was started on cefepime  and vancomycin  for wound infection.  Cultures are growing Klebsiella without resistance.  Antibiotic therapy was narrowed to ceftriaxone . Stay further complicated by newly found stress fracture of the right tibial plateau.  Patient was placed in knee immobilizer and made nonweightbearing.  Due to this change in her ambulatory status they recommended for SNF placement.  TOC is working on this  Subjective: No acute events overnight.  Patient is visiting with her daughter at bedside today.  I have updated [name] as to the care plan  Objective: Vitals:   05/09/24 0012 05/09/24 0436 05/09/24 0833 05/09/24 1233  BP: (!) 148/80 (!) 139/102 119/82 121/86  Pulse: (!) 107 89 95   Resp: 18 16 17 18   Temp: 98 F (36.7 C) 97.7 F (36.5 C) 98 F (36.7 C) 98.3 F (36.8 C)  TempSrc:  Oral Oral   SpO2: 93% 95% 94% 96%  Weight:        Intake/Output Summary (Last 24 hours) at 05/09/2024 1558 Last data filed at 05/09/2024 1432 Gross per 24 hour  Intake 120 ml  Output 2100 ml  Net -1980 ml   Filed Weights   05/05/24 0127  Weight: 72.6 kg    Examination: General exam: Appears calm  and comfortable, NAD  Respiratory system: No work of breathing, symmetric chest wall expansion Cardiovascular system: S1 & S2 heard, RRR.  Gastrointestinal system: Abdomen is nondistended, soft and nontender.  Neuro: Alert and oriented. No focal neurological deficits. Extremities: Leg wound freshly bandaged c/d/i.  Assessment & Plan:  Principal Problem:   Chronic nonhealing wound right lower extremity Active Problems:   Infected wound   Paroxysmal atrial fibrillation with RVR (HCC)   Supratherapeutic INR   Chronic obstructive pulmonary disease (COPD) (HCC)   Essential hypertension   Rheumatoid arthritis (HCC)   DVT (deep venous thrombosis) (HCC)   Gout   Stage 3a chronic kidney disease (HCC)   Long term current use of systemic steroids   HFrEF (heart failure with reduced ejection fraction) (HCC)   Open wound of right lower extremity    Chronic nonhealing wound right lower extremity Infected hematoma chronic wound - MRI right lower extremity: Collection of fluid underlying the investing fascia of the lateral anterior posterior compartment measuring 7.2 cm x 1 cm x 10 cm which may represent an infected hematoma given the history of prior trauma. - She was also noted to have subcutaneous edema throughout the calf. - No evidence of acute osteomyelitis on this MRI - Vascular surgery was consulted and recommends Unna boot wrappings to the right lower extremity to help with compression and reduce some swelling.  Operative debriding not recommended at this  time. - Plan for every other day Unna boot and dressing changes, have discussed with bedside RN - Cultures revealed Klebsiella sensitive to all except ampicillin, as well as Enterococcus faecalis. - In light of her penicillin allergy antibiotic selection has been difficult.  I have discussed with ID Pharm.  For now we are planning to proceed with ceftriaxone  and linezolid  to complete a 7-day course - Blood cultures remain  negative  Subcortical stress fracture of the posterior medial tibial plateau - Incidentally seen on lower extremity MRI.  Dedicated MRI also reveals complex degenerative tear of posterior horn of medial meniscus and degenerative changes of the posterior horn root and lateral meniscus without a discrete tear..  Regional muscular atrophy suggesting sarcopenia - Discussed with orthopedic surgery who recommends knee immobilizer and nonweightbearing.   - PT/OT now recommending SNF.  TOC aware working on these arrangements  Therapeutic INR - INR returned at 6.2.  No active bleeding appreciated outside of hematoma in leg - Pharmacy consulted, appreciate medication adjustments  - INR gradually downtrending - Of note patient was previously on apixaban  but had DVT thought to be apixaban  failure  Bloody bowel movements - Likely secondary to hemorrhoidal bleed. - Repeat CBC shows rising hemoglobin. - Presently patient is refusing blood transfusion and colonoscopy but reports she is open for discussion if the situation becomes more urgent.  Will continue to monitor closely.  Paroxysmal A-fib with RVR - Continue with diltiazem  - Warfarin as above  Acute on chronic anemia - Acute worsening secondary to the large hematoma and now bloody bowel movements - Repeat CBC in a.m. - As above currently refusing blood transfusion but she will reevaluate if hemoglobin continues to drop  COPD, not currently in exacerbation - Continue home inhalers  Stage IIIa CKD - Kidney function at baseline - Avoid nephrotoxics  Gout - Resume home meds  Rheumatoid arthritis Immunosuppression - On chronic steroid therapy.  Generalized deconditioning - Resides at assisted living facility - PT/OT recommending SNF.  TOC working on arrangements  Body mass index is 25.82 kg/m. - Outpatient follow up for lifestyle modification and risk factor management   DVT prophylaxis: Warfarin   Code Status: Limited: Do not  attempt resuscitation (DNR) -DNR-LIMITED -Do Not Intubate/DNI  Disposition: Needs SNF.  Medically ready for discharge otherwise  Consultants:  Treatment Team:  Consulting Physician: Tobie Priest, DO  Procedures:    Antimicrobials:  Anti-infectives (From admission, onward)    Start     Dose/Rate Route Frequency Ordered Stop   05/07/24 1800  linezolid  (ZYVOX ) tablet 600 mg        600 mg Oral Every 12 hours 05/07/24 1650 05/14/24 2159   05/06/24 1800  cefTRIAXone  (ROCEPHIN ) 2 g in sodium chloride  0.9 % 100 mL IVPB        2 g 200 mL/hr over 30 Minutes Intravenous Every 24 hours 05/06/24 1642 05/13/24 1759   05/06/24 0000  vancomycin  (VANCOCIN ) IVPB 1000 mg/200 mL premix  Status:  Discontinued        1,000 mg 200 mL/hr over 60 Minutes Intravenous Every 24 hours 05/05/24 0401 05/07/24 1547   05/05/24 1200  cefTRIAXone  (ROCEPHIN ) 1 g in sodium chloride  0.9 % 100 mL IVPB  Status:  Discontinued        1 g 200 mL/hr over 30 Minutes Intravenous Every 24 hours 05/05/24 0315 05/05/24 0847   05/05/24 0130  ceFEPIme  (MAXIPIME ) 2 g in sodium chloride  0.9 % 100 mL IVPB  2 g 200 mL/hr over 30 Minutes Intravenous  Once 05/05/24 0122 05/05/24 0134   05/05/24 0130  vancomycin  (VANCOREADY) IVPB 1750 mg/350 mL        1,750 mg 175 mL/hr over 120 Minutes Intravenous  Once 05/05/24 0128 05/05/24 0445       Data Reviewed: I have personally reviewed following labs and imaging studies CBC: Recent Labs  Lab 05/04/24 1749 05/06/24 0435 05/07/24 0419 05/09/24 0518  WBC 10.7* 6.6 7.3 8.0  HGB 11.6* 8.9* 8.8* 9.4*  HCT 36.5 28.8* 28.7* 29.3*  MCV 95.3 98.0 97.6 94.5  PLT 382 291 276 297   Basic Metabolic Panel: Recent Labs  Lab 05/04/24 1749 05/05/24 0138 05/06/24 0435 05/07/24 0419 05/09/24 0518  NA 139  --  140 140 140  K 3.8  --  3.6 3.7 3.8  CL 100  --  106 108 109  CO2 29  --  26 25 24   GLUCOSE 131*  --  92 121* 94  BUN 20  --  15 15 13   CREATININE 1.00  --  0.97 0.83 0.80   CALCIUM 10.4*  --  9.3 9.7 10.0  MG  --  2.2  --   --  2.0  PHOS  --   --   --   --  2.2*   GFR: Estimated Creatinine Clearance: 49.6 mL/min (by C-G formula based on SCr of 0.8 mg/dL). Liver Function Tests: Recent Labs  Lab 05/07/24 0419 05/09/24 0518  AST 10* 13*  ALT 6 8  ALKPHOS 76 81  BILITOT <0.2 0.3  PROT 5.3* 5.4*  ALBUMIN 2.7* 2.8*   CBG: No results for input(s): GLUCAP in the last 168 hours.  Recent Results (from the past 240 hours)  Blood culture (routine x 2)     Status: None (Preliminary result)   Collection Time: 05/05/24  1:25 AM   Specimen: BLOOD  Result Value Ref Range Status   Specimen Description BLOOD LEFT ANTECUBITAL  Final   Special Requests   Final    BOTTLES DRAWN AEROBIC AND ANAEROBIC Blood Culture results may not be optimal due to an inadequate volume of blood received in culture bottles   Culture   Final    NO GROWTH 4 DAYS Performed at [facility], [address] [place], [place] [address]    Report Status PENDING  Incomplete  Blood culture (routine x 2)     Status: None (Preliminary result)   Collection Time: 05/05/24  1:39 AM   Specimen: BLOOD  Result Value Ref Range Status   Specimen Description BLOOD BLOOD LEFT ARM  Final   Special Requests   Final    BOTTLES DRAWN AEROBIC AND ANAEROBIC Blood Culture results may not be optimal due to an inadequate volume of blood received in culture bottles   Culture   Final    NO GROWTH 4 DAYS Performed at [facility], [address], [place], [place] [address]    Report Status PENDING  Incomplete  Aerobic/Anaerobic Culture w Gram Stain (surgical/deep wound)     Status: None (Preliminary result)   Collection Time: 05/05/24  1:39 AM   Specimen: Leg; Wound  Result Value Ref Range Status   Specimen Description   Final    LEG Performed at [facility], [address] [place], [place] [address]    Special Requests   Final    NONE Performed at [facility], [address] [place], [place] [address]  Gram Stain   Final    FEW WBC PRESENT, PREDOMINANTLY PMN FEW GRAM POSITIVE COCCI Performed at [facility], [address], [place], [place] [address]    Culture   Final    MODERATE KLEBSIELLA PNEUMONIAE MODERATE ENTEROCOCCUS FAECALIS NO ANAEROBES ISOLATED; CULTURE IN PROGRESS FOR 5 DAYS    Report Status PENDING  Incomplete   Organism ID, Bacteria KLEBSIELLA PNEUMONIAE  Final   Organism ID, Bacteria ENTEROCOCCUS FAECALIS  Final      Susceptibility   Enterococcus faecalis - MIC*    AMPICILLIN <=2 SENSITIVE Sensitive     VANCOMYCIN  1 SENSITIVE Sensitive     GENTAMICIN SYNERGY SENSITIVE Sensitive     * MODERATE ENTEROCOCCUS FAECALIS   Klebsiella pneumoniae - MIC*    AMPICILLIN RESISTANT Resistant     CEFAZOLIN (NON-URINE) <=1 SENSITIVE Sensitive     CEFEPIME  <=0.12 SENSITIVE Sensitive     ERTAPENEM <=0.12 SENSITIVE Sensitive     CEFTRIAXONE  <=0.25 SENSITIVE Sensitive     CIPROFLOXACIN <=0.06 SENSITIVE Sensitive     GENTAMICIN <=1 SENSITIVE Sensitive     MEROPENEM <=0.25 SENSITIVE Sensitive     TRIMETH/SULFA <=20 SENSITIVE Sensitive     AMPICILLIN/SULBACTAM 4 SENSITIVE Sensitive     PIP/TAZO Value in next row Sensitive      <=4 SENSITIVEThis is a modified FDA-approved test that has been validated and its performance characteristics determined by the reporting laboratory.  This laboratory is certified under the Clinical Laboratory Improvement Amendments CLIA as qualified to perform high complexity clinical laboratory testing.    * MODERATE KLEBSIELLA PNEUMONIAE  Resp panel by RT-PCR (RSV, Flu A&B, Covid) Anterior Nasal Swab     Status: None   Collection Time: 05/05/24  9:12 AM   Specimen: Anterior Nasal Swab  Result Value Ref Range Status   SARS Coronavirus 2 by RT PCR NEGATIVE NEGATIVE Final    Comment: (NOTE) SARS-CoV-2 target nucleic acids are NOT DETECTED.  The SARS-CoV-2 RNA is generally detectable in upper  respiratory specimens during the acute phase of infection. The lowest concentration of SARS-CoV-2 viral copies this assay can detect is 138 copies/mL. A negative result does not preclude SARS-Cov-2 infection and should not be used as the sole basis for treatment or other patient management decisions. A negative result may occur with  improper specimen collection/handling, submission of specimen other than nasopharyngeal swab, presence of viral mutation(s) within the areas targeted by this assay, and inadequate number of viral copies(<138 copies/mL). A negative result must be combined with clinical observations, patient history, and epidemiological information. The expected result is Negative.  Fact Sheet for Patients:  bloggercourse.com  Fact Sheet for Healthcare Providers:  seriousbroker.it  This test is no t yet approved or cleared by the [place]  FDA and  has been authorized for detection and/or diagnosis of SARS-CoV-2 by FDA under an Emergency Use Authorization (EUA). This EUA will remain  in effect (meaning this test can be used) for the duration of the COVID-19 declaration under Section 564(b)(1) of the Act, 21 U.S.C.section 360bbb-3(b)(1), unless the authorization is terminated  or revoked sooner.       Influenza A by PCR NEGATIVE NEGATIVE Final   Influenza B by PCR NEGATIVE NEGATIVE Final    Comment: (NOTE) The Xpert Xpress SARS-CoV-2/FLU/RSV plus assay is intended as an aid in the diagnosis of influenza from Nasopharyngeal swab specimens and should not be used as a sole basis for treatment. Nasal washings and aspirates are unacceptable for Xpert Xpress SARS-CoV-2/FLU/RSV testing.  Fact Sheet for Patients: bloggercourse.com  Fact Sheet for Healthcare Providers: seriousbroker.it  This test is not yet approved or cleared by the [place]  FDA and has been  authorized for detection and/or diagnosis of SARS-CoV-2 by FDA under an Emergency Use Authorization (EUA). This EUA will remain in effect (meaning this test can be used) for the duration of the COVID-19 declaration under Section 564(b)(1) of the Act, 21 U.S.C. section 360bbb-3(b)(1), unless the authorization is terminated or revoked.     Resp Syncytial Virus by PCR NEGATIVE NEGATIVE Final    Comment: (NOTE) Fact Sheet for Patients: bloggercourse.com  Fact Sheet for Healthcare Providers: seriousbroker.it  This test is not yet approved or cleared by the [place]  FDA and has been authorized for detection and/or diagnosis of SARS-CoV-2 by FDA under an Emergency Use Authorization (EUA). This EUA will remain in effect (meaning this test can be used) for the duration of the COVID-19 declaration under Section 564(b)(1) of the Act, 21 U.S.C. section 360bbb-3(b)(1), unless the authorization is terminated or revoked.  Performed at [facility], [address] [place], [place] [address]   MRSA Next Gen by PCR, Nasal     Status: None   Collection Time: 05/06/24  4:43 PM   Specimen: Nasal Mucosa; Nasal Swab  Result Value Ref Range Status   MRSA by PCR Next Gen NOT DETECTED NOT DETECTED Final    Comment: (NOTE) The GeneXpert MRSA Assay (FDA approved for NASAL specimens only), is one component of a comprehensive MRSA colonization surveillance program. It is not intended to diagnose MRSA infection nor to guide or monitor treatment for MRSA infections. Test performance is not FDA approved in patients less than 51 years old. Performed at [facility], [address] [place], [place] [address]      Radiology Studies: No results found.   Scheduled Meds:  allopurinol   50 mg Oral Daily   diltiazem   240 mg Oral Daily   feeding supplement  237 mL Oral BID BM   linezolid   600 mg Oral Q12H   melatonin  5 mg Oral QHS    polyethylene glycol  17 g Oral Daily   predniSONE   5 mg Oral Daily   sertraline   50 mg Oral Daily   simvastatin   10 mg Oral QPM   warfarin  5 mg Oral ONCE-1600   Warfarin - Pharmacist Dosing Inpatient   Does not apply q1600   Continuous Infusions:  sodium chloride  75 mL/hr at 05/09/24 0834   cefTRIAXone  (ROCEPHIN )  IV 2 g (05/08/24 1726)     LOS: 4 days  MDM: Patient is high risk for one or more organ failure.  They necessitate ongoing hospitalization for continued IV therapies and subsequent lab monitoring. Total time spent interpreting labs and vitals, reviewing the medical record, coordinating care amongst consultants and care team members, directly assessing and discussing care with the patient and/or family: 55 min  Elza Sortor, DO [facility]  To contact the attending physician between 7A-7P please use Epic Chat. To contact the covering physician during after hours 7P-7A, please review Amion.  05/09/2024, 3:58 PM   *This document has been created with the assistance of dictation software. Please excuse typographical errors. *   "

## 2024-05-09 NOTE — Plan of Care (Signed)
   Problem: Education: Goal: Knowledge of General Education information will improve Description: Including pain rating scale, medication(s)/side effects and non-pharmacologic comfort measures Outcome: Progressing   Problem: Clinical Measurements: Goal: Ability to maintain clinical measurements within normal limits will improve Outcome: Progressing Goal: Will remain free from infection Outcome: Progressing

## 2024-05-09 NOTE — Consult Note (Signed)
 PHARMACY - ANTICOAGULATION CONSULT NOTE  Pharmacy Consult for warfarin dosing Indication: atrial fibrillation  Allergies[1]  Patient Measurements: Weight: 72.6 kg (160 lb) (From O/V note on 04/18/24)  Vital Signs: Temp: 98 F (36.7 C) (01/10 0833) Temp Source: Oral (01/10 0833) BP: 119/82 (01/10 0833) Pulse Rate: 95 (01/10 0833)  Labs: Recent Labs    05/07/24 0419 05/07/24 1205 05/08/24 1024 05/09/24 0518  HGB 8.8*  --   --  9.4*  HCT 28.7*  --   --  29.3*  PLT 276  --   --  297  LABPROT 26.6* 22.9* 19.2* 20.5*  INR 2.3* 1.9* 1.5* 1.7*  CREATININE 0.83  --   --  0.80    Estimated Creatinine Clearance: 49.6 mL/min (by C-G formula based on SCr of 0.8 mg/dL).   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 Past medical history notable for CKD 3a, gout, systolic CHF, dementia, hypertension, hyperlipidemia, DVT, A-fib on Coumadin , COPD, chronic kidney disease, and rheumatoid arthritis on prednisone  daily.  1/6 AM: Patient's INR 6.2, supratherapeutic. There are various potential explanations for the supratherapeutic level including age (>89yo), interaction with home allopurinol , interaction with recent clindamycin  prescription from 12/30, and the potential increased bleed risk with the chronic nonhealing wound.   Patient without symptoms and INR not due to a recent procedure. Per hospitalist, plan to hold off of oral vitamin K and check INR  in the AM.  Hgb in line with historical baseline. Plt stable. Albumin wnl.  DDI: allopurinol , prednisone , abx (ceftriaxone /zyvox )  Date  Time   INR    Comment   Dose 1/6     0139   6.2     SUPRAtherapeutic HOLD 1/7     0435   4.8     SUPRAtherapeutic, HOLD                                  Hgb 8.9/PLTs 291 1/8     0419   2.3     therapeutic x1  4 mg 1/9     1024   1.5     SUBtherapeutic   6 mg 1/10   0518   1.7     SUBtherapeutic      Goal of Therapy:  INR 2-3 Monitor platelets by anticoagulation protocol: Yes   Plan:  INR SUBtherapeutic Will order Warfarin 5 mg PO x 1 No concern for bridging right now due to CHadsVasc score of 6 (< 7), no hx of Afib or TIA Will continue to monitor INR daily until stable  Monitor CBC at least every 3 days    Allean Haas PharmD Clinical Pharmacist 05/09/2024           [1]  Allergies Allergen Reactions  Metoprolol Shortness Of Breath   Penicillins Other (See Comments)    Internal swelling including throat TOLERATES CEFTRIAXONE    Methotrexate And Trimetrexate Nausea Only   Doxycycline Hyclate Rash

## 2024-05-10 DIAGNOSIS — L98493 Non-pressure chronic ulcer of skin of other sites with necrosis of muscle: Secondary | ICD-10-CM | POA: Diagnosis not present

## 2024-05-10 DIAGNOSIS — L089 Local infection of the skin and subcutaneous tissue, unspecified: Secondary | ICD-10-CM | POA: Diagnosis not present

## 2024-05-10 LAB — AEROBIC/ANAEROBIC CULTURE W GRAM STAIN (SURGICAL/DEEP WOUND)

## 2024-05-10 LAB — URINALYSIS, W/ REFLEX TO CULTURE (INFECTION SUSPECTED)
Bilirubin Urine: NEGATIVE
Glucose, UA: NEGATIVE mg/dL
Hgb urine dipstick: NEGATIVE
Ketones, ur: NEGATIVE mg/dL
Nitrite: NEGATIVE
Protein, ur: NEGATIVE mg/dL
RBC / HPF: >50 RBC/hpf (ref 0–5)
Specific Gravity, Urine: 1.011 (ref 1.005–1.030)
WBC, UA: >50 WBC/hpf (ref 0–5)
pH: 6 (ref 5.0–8.0)

## 2024-05-10 LAB — CULTURE, BLOOD (ROUTINE X 2)
Culture: NO GROWTH
Culture: NO GROWTH

## 2024-05-10 LAB — PROTIME-INR
INR: 1.8 — ABNORMAL HIGH (ref 0.8–1.2)
Prothrombin Time: 22.1 s — ABNORMAL HIGH (ref 11.4–15.2)

## 2024-05-10 MED ORDER — WARFARIN SODIUM 6 MG PO TABS
6.0000 mg | ORAL_TABLET | Freq: Once | ORAL | Status: AC
  Administered 2024-05-10: 6 mg via ORAL
  Filled 2024-05-10: qty 1

## 2024-05-10 MED ORDER — CHLORHEXIDINE GLUCONATE CLOTH 2 % EX PADS
6.0000 | MEDICATED_PAD | Freq: Every day | CUTANEOUS | Status: DC
  Administered 2024-05-10 – 2024-05-11 (×2): 6 via TOPICAL

## 2024-05-10 NOTE — Progress Notes (Signed)
 " PROGRESS NOTE    Roberta Ross  FMW:[id number] DOB: 1933-12-24 DOA: 05/05/2024 PCP: Lenon Layman ORN, MD  Chief Complaint  Patient presents with   Wound Infection    Hospital Course:  Roberta Ross is an 89 year old female with A-fib on warfarin, CKD stage IIIa, gout, hypertension, systolic CHF, COPD, femoral DVT in September 2025, rheumatoid arthritis on daily prednisone , with a chronic nonhealing wound to the right lower leg after an injury in October.  She was admitted due to concerns of infection of this chronic wound and incidentally found to be rapid A-fib during ED workup. Prior to arrival her facility started her on clindamycin  due to foul-smelling wound for 1 week. In the ED she was A-fib RVR in the 130s, O2 dropped to 88%.  She was started on diltiazem . She was started on cefepime  and vancomycin  for wound infection.  Cultures are growing Klebsiella without resistance.  Antibiotic therapy was narrowed to ceftriaxone . Stay further complicated by newly found stress fracture of the right tibial plateau.  Patient was placed in knee immobilizer and made nonweightbearing.  Due to this change in her ambulatory status they recommended for SNF placement.  TOC is working on this  Subjective: Evaluation today patient is more confused than her baseline.  She reports that her vision is blurry, bedside RN also reports that she has been experiencing hallucinations. Patient is able to accurately report the time from the clock on the wall and easily answer questions about visual stimuli around her.  She is refusing head CT.  No family at bedside.  I attempted to reach the patient's daughter, [name], no answer.  Objective: Vitals:   05/09/24 2116 05/10/24 0025 05/10/24 0420 05/10/24 0839  BP: 135/77 119/81 121/85 (!) 141/93  Pulse: (!) 101 90 95 76  Resp: 18 19 20 18   Temp: 98 F (36.7 C) 98.1 F (36.7 C) 98.3 F (36.8 C) (!) 97.5 F (36.4 C)  TempSrc: Oral Oral Oral  Oral  SpO2: 96% 95% 96%   Weight:        Intake/Output Summary (Last 24 hours) at 05/10/2024 1657 Last data filed at 05/10/2024 1427 Gross per 24 hour  Intake 120 ml  Output 1100 ml  Net -980 ml   Filed Weights   05/05/24 0127  Weight: 72.6 kg    Examination: General exam: Appears calm and comfortable, NAD  Respiratory system: No work of breathing, symmetric chest wall expansion Cardiovascular system: S1 & S2 heard, RRR.  Gastrointestinal system: Abdomen is mildly distended in the suprapubic region. Neuro: Alert and disoriented.  Appropriately states the time, and is able to state various visual field questions correctly Extremities: Leg wound freshly bandaged c/d/i.  Assessment & Plan:  Principal Problem:   Chronic nonhealing wound right lower extremity Active Problems:   Infected wound   Paroxysmal atrial fibrillation with RVR (HCC)   Supratherapeutic INR   Chronic obstructive pulmonary disease (COPD) (HCC)   Essential hypertension   Rheumatoid arthritis (HCC)   DVT (deep venous thrombosis) (HCC)   Gout   Stage 3a chronic kidney disease (HCC)   Long term current use of systemic steroids   HFrEF (heart failure with reduced ejection fraction) (HCC)   Open wound of right lower extremity     Acute encephalopathy - Suspect this is secondary to hospital delirium - Patient is complaining of blurry vision but is passing visual testing - She is refusing head CT - Bladder does appear distended.  Bladder scan reveals 386 cc.  Have asked RN to place Foley catheter and we will send sample for UA - Continue frequent reorientation and delirium precautions - Repeat labs in a.m. - If she develops focal neurologic weakness we will pursue head CT  Chronic nonhealing wound right lower extremity Infected hematoma chronic wound - MRI right lower extremity: Collection of fluid underlying the investing fascia of the lateral anterior posterior compartment measuring 7.2 cm x 1 cm x 10  cm which may represent an infected hematoma given the history of prior trauma. - She was also noted to have subcutaneous edema throughout the calf. - No evidence of acute osteomyelitis on this MRI - Vascular surgery was consulted and recommends Unna boot wrappings to the right lower extremity to help with compression and reduce some swelling.  Operative debriding not recommended at this time. - Plan for every other day Unna boot and dressing changes, have discussed with bedside RN - Cultures revealed Klebsiella sensitive to all except ampicillin, as well as Enterococcus faecalis. - In light of her penicillin allergy antibiotic selection has been difficult.  I have discussed with ID Pharm.  For now we are planning to proceed with ceftriaxone  and linezolid  to complete a 7-day course - Blood cultures remain negative  Subcortical stress fracture of the posterior medial tibial plateau - Incidentally seen on lower extremity MRI.  Dedicated MRI also reveals complex degenerative tear of posterior horn of medial meniscus and degenerative changes of the posterior horn root and lateral meniscus without a discrete tear..  Regional muscular atrophy suggesting sarcopenia - Discussed with orthopedic surgery who recommends knee immobilizer and nonweightbearing.   - PT/OT now recommending SNF.  TOC aware working on these arrangements  Therapeutic INR - INR returned at 6.2.  No active bleeding appreciated outside of hematoma in leg - Pharmacy consulted, appreciate medication adjustments  - INR has now down trended it is subtherapeutic.  Continue daily warfarin adjustments - Of note patient was previously on apixaban  but had DVT thought to be apixaban  failure  Bloody bowel movements - Likely secondary to hemorrhoidal bleed. - Repeat CBC shows rising hemoglobin. - Presently patient is refusing blood transfusion and colonoscopy but reports she is open for discussion if the situation becomes more urgent.  Will  continue to monitor closely.  Paroxysmal A-fib with RVR - Continue with diltiazem  - Warfarin as above  Acute on chronic anemia - Acute worsening secondary to the large hematoma and now bloody bowel movements - Repeat CBC in a.m. - As above currently refusing blood transfusion but she will reevaluate if hemoglobin continues to drop  COPD, not currently in exacerbation - Continue home inhalers  Stage IIIa CKD - Kidney function at baseline - Avoid nephrotoxics  Gout - Resume home meds  Rheumatoid arthritis Immunosuppression - On chronic steroid therapy.  Generalized deconditioning - Resides at assisted living facility - PT/OT recommending SNF.  TOC working on arrangements  Body mass index is 25.82 kg/m. - Outpatient follow up for lifestyle modification and risk factor management   DVT prophylaxis: Warfarin   Code Status: Limited: Do not attempt resuscitation (DNR) -DNR-LIMITED -Do Not Intubate/DNI  Disposition: Needs SNF.  Consultants:  Treatment Team:  Consulting Physician: Tobie Priest, DO  Procedures:    Antimicrobials:  Anti-infectives (From admission, onward)    Start     Dose/Rate Route Frequency Ordered Stop   05/07/24 1800  linezolid  (ZYVOX ) tablet 600 mg        600 mg Oral Every 12 hours 05/07/24 1650 05/14/24 2159  05/06/24 1800  cefTRIAXone  (ROCEPHIN ) 2 g in sodium chloride  0.9 % 100 mL IVPB        2 g 200 mL/hr over 30 Minutes Intravenous Every 24 hours 05/06/24 1642 05/13/24 1759   05/06/24 0000  vancomycin  (VANCOCIN ) IVPB 1000 mg/200 mL premix  Status:  Discontinued        1,000 mg 200 mL/hr over 60 Minutes Intravenous Every 24 hours 05/05/24 0401 05/07/24 1547   05/05/24 1200  cefTRIAXone  (ROCEPHIN ) 1 g in sodium chloride  0.9 % 100 mL IVPB  Status:  Discontinued        1 g 200 mL/hr over 30 Minutes Intravenous Every 24 hours 05/05/24 0315 05/05/24 0847   05/05/24 0130  ceFEPIme  (MAXIPIME ) 2 g in sodium chloride  0.9 % 100 mL IVPB        2  g 200 mL/hr over 30 Minutes Intravenous  Once 05/05/24 0122 05/05/24 0134   05/05/24 0130  vancomycin  (VANCOREADY) IVPB 1750 mg/350 mL        1,750 mg 175 mL/hr over 120 Minutes Intravenous  Once 05/05/24 0128 05/05/24 0445       Data Reviewed: I have personally reviewed following labs and imaging studies CBC: Recent Labs  Lab 05/04/24 1749 05/06/24 0435 05/07/24 0419 05/09/24 0518  WBC 10.7* 6.6 7.3 8.0  HGB 11.6* 8.9* 8.8* 9.4*  HCT 36.5 28.8* 28.7* 29.3*  MCV 95.3 98.0 97.6 94.5  PLT 382 291 276 297   Basic Metabolic Panel: Recent Labs  Lab 05/04/24 1749 05/05/24 0138 05/06/24 0435 05/07/24 0419 05/09/24 0518  NA 139  --  140 140 140  K 3.8  --  3.6 3.7 3.8  CL 100  --  106 108 109  CO2 29  --  26 25 24   GLUCOSE 131*  --  92 121* 94  BUN 20  --  15 15 13   CREATININE 1.00  --  0.97 0.83 0.80  CALCIUM 10.4*  --  9.3 9.7 10.0  MG  --  2.2  --   --  2.0  PHOS  --   --   --   --  2.2*   GFR: Estimated Creatinine Clearance: 49.6 mL/min (by C-G formula based on SCr of 0.8 mg/dL). Liver Function Tests: Recent Labs  Lab 05/07/24 0419 05/09/24 0518  AST 10* 13*  ALT 6 8  ALKPHOS 76 81  BILITOT <0.2 0.3  PROT 5.3* 5.4*  ALBUMIN 2.7* 2.8*   CBG: No results for input(s): GLUCAP in the last 168 hours.  Recent Results (from the past 240 hours)  Blood culture (routine x 2)     Status: None   Collection Time: 05/05/24  1:25 AM   Specimen: BLOOD  Result Value Ref Range Status   Specimen Description BLOOD LEFT ANTECUBITAL  Final   Special Requests   Final    BOTTLES DRAWN AEROBIC AND ANAEROBIC Blood Culture results may not be optimal due to an inadequate volume of blood received in culture bottles   Culture   Final    NO GROWTH 5 DAYS Performed at [facility], [address] [place], [place] [address]    Report Status 05/10/2024 FINAL  Final  Blood culture (routine x 2)     Status: None   Collection Time: 05/05/24  1:39 AM   Specimen: BLOOD   Result Value Ref Range Status   Specimen Description BLOOD BLOOD LEFT ARM  Final   Special Requests   Final    BOTTLES DRAWN  AEROBIC AND ANAEROBIC Blood Culture results may not be optimal due to an inadequate volume of blood received in culture bottles   Culture   Final    NO GROWTH 5 DAYS Performed at [facility], [address] [place], [place] [address]    Report Status 05/10/2024 FINAL  Final  Aerobic/Anaerobic Culture w Gram Stain (surgical/deep wound)     Status: None   Collection Time: 05/05/24  1:39 AM   Specimen: Leg; Wound  Result Value Ref Range Status   Specimen Description   Final    LEG Performed at [facility] [address] [place], [place] [address]    Special Requests   Final    NONE Performed at [facility], [address] [place], [place] [address]    Gram Stain   Final    FEW WBC PRESENT, PREDOMINANTLY PMN FEW GRAM POSITIVE COCCI    Culture   Final    MODERATE KLEBSIELLA PNEUMONIAE MODERATE ENTEROCOCCUS FAECALIS NO ANAEROBES ISOLATED Performed at [facility], [address], [place], [place] [address]    Report Status 05/10/2024 FINAL  Final   Organism ID, Bacteria KLEBSIELLA PNEUMONIAE  Final   Organism ID, Bacteria ENTEROCOCCUS FAECALIS  Final      Susceptibility   Enterococcus faecalis - MIC*    AMPICILLIN <=2 SENSITIVE Sensitive     VANCOMYCIN  1 SENSITIVE Sensitive     GENTAMICIN SYNERGY SENSITIVE Sensitive     * MODERATE ENTEROCOCCUS FAECALIS   Klebsiella pneumoniae - MIC*    AMPICILLIN RESISTANT Resistant     CEFAZOLIN (NON-URINE) <=1 SENSITIVE Sensitive     CEFEPIME  <=0.12 SENSITIVE Sensitive     ERTAPENEM <=0.12 SENSITIVE Sensitive     CEFTRIAXONE  <=0.25 SENSITIVE Sensitive     CIPROFLOXACIN <=0.06 SENSITIVE Sensitive     GENTAMICIN <=1 SENSITIVE Sensitive     MEROPENEM <=0.25 SENSITIVE Sensitive     TRIMETH/SULFA <=20 SENSITIVE Sensitive     AMPICILLIN/SULBACTAM 4 SENSITIVE Sensitive      PIP/TAZO Value in next row Sensitive      <=4 SENSITIVEThis is a modified FDA-approved test that has been validated and its performance characteristics determined by the reporting laboratory.  This laboratory is certified under the Clinical Laboratory Improvement Amendments CLIA as qualified to perform high complexity clinical laboratory testing.    * MODERATE KLEBSIELLA PNEUMONIAE  Resp panel by RT-PCR (RSV, Flu A&B, Covid) Anterior Nasal Swab     Status: None   Collection Time: 05/05/24  9:12 AM   Specimen: Anterior Nasal Swab  Result Value Ref Range Status   SARS Coronavirus 2 by RT PCR NEGATIVE NEGATIVE Final    Comment: (NOTE) SARS-CoV-2 target nucleic acids are NOT DETECTED.  The SARS-CoV-2 RNA is generally detectable in upper respiratory specimens during the acute phase of infection. The lowest concentration of SARS-CoV-2 viral copies this assay can detect is 138 copies/mL. A negative result does not preclude SARS-Cov-2 infection and should not be used as the sole basis for treatment or other patient management decisions. A negative result may occur with  improper specimen collection/handling, submission of specimen other than nasopharyngeal swab, presence of viral mutation(s) within the areas targeted by this assay, and inadequate number of viral copies(<138 copies/mL). A negative result must be combined with clinical observations, patient history, and epidemiological information. The expected result is Negative.  Fact Sheet for Patients:  bloggercourse.com  Fact Sheet for Healthcare Providers:  seriousbroker.it  This test is no t yet approved or  cleared by the [place]  FDA and  has been authorized for detection and/or diagnosis of SARS-CoV-2 by FDA under an Emergency Use Authorization (EUA). This EUA will remain  in effect (meaning this test can be used) for the duration of the COVID-19 declaration under Section  564(b)(1) of the Act, 21 U.S.C.section 360bbb-3(b)(1), unless the authorization is terminated  or revoked sooner.       Influenza A by PCR NEGATIVE NEGATIVE Final   Influenza B by PCR NEGATIVE NEGATIVE Final    Comment: (NOTE) The Xpert Xpress SARS-CoV-2/FLU/RSV plus assay is intended as an aid in the diagnosis of influenza from Nasopharyngeal swab specimens and should not be used as a sole basis for treatment. Nasal washings and aspirates are unacceptable for Xpert Xpress SARS-CoV-2/FLU/RSV testing.  Fact Sheet for Patients: bloggercourse.com  Fact Sheet for Healthcare Providers: seriousbroker.it  This test is not yet approved or cleared by the [place]  FDA and has been authorized for detection and/or diagnosis of SARS-CoV-2 by FDA under an Emergency Use Authorization (EUA). This EUA will remain in effect (meaning this test can be used) for the duration of the COVID-19 declaration under Section 564(b)(1) of the Act, 21 U.S.C. section 360bbb-3(b)(1), unless the authorization is terminated or revoked.     Resp Syncytial Virus by PCR NEGATIVE NEGATIVE Final    Comment: (NOTE) Fact Sheet for Patients: bloggercourse.com  Fact Sheet for Healthcare Providers: seriousbroker.it  This test is not yet approved or cleared by the [place]  FDA and has been authorized for detection and/or diagnosis of SARS-CoV-2 by FDA under an Emergency Use Authorization (EUA). This EUA will remain in effect (meaning this test can be used) for the duration of the COVID-19 declaration under Section 564(b)(1) of the Act, 21 U.S.C. section 360bbb-3(b)(1), unless the authorization is terminated or revoked.  Performed at [facility], [address] [place], [place] [address]   MRSA Next Gen by PCR, Nasal     Status: None   Collection Time: 05/06/24  4:43 PM   Specimen: Nasal Mucosa;  Nasal Swab  Result Value Ref Range Status   MRSA by PCR Next Gen NOT DETECTED NOT DETECTED Final    Comment: (NOTE) The GeneXpert MRSA Assay (FDA approved for NASAL specimens only), is one component of a comprehensive MRSA colonization surveillance program. It is not intended to diagnose MRSA infection nor to guide or monitor treatment for MRSA infections. Test performance is not FDA approved in patients less than 89 years old. Performed at [facility], [address] [place], [place] [address]      Radiology Studies: No results found.   Scheduled Meds:  allopurinol   50 mg Oral Daily   diltiazem   240 mg Oral Daily   feeding supplement  237 mL Oral BID BM   linezolid   600 mg Oral Q12H   melatonin  5 mg Oral QHS   polyethylene glycol  17 g Oral Daily   predniSONE   5 mg Oral Daily   sertraline   50 mg Oral Daily   simvastatin   10 mg Oral QPM   Warfarin - Pharmacist Dosing Inpatient   Does not apply q1600   Continuous Infusions:  sodium chloride  75 mL/hr at 05/10/24 1437   cefTRIAXone  (ROCEPHIN )  IV 2 g (05/09/24 1742)     LOS: 5 days  MDM: Patient is high risk for one or more organ failure.  They necessitate ongoing hospitalization for continued IV therapies and subsequent lab monitoring. Total time spent interpreting labs and  vitals, reviewing the medical record, coordinating care amongst consultants and care team members, directly assessing and discussing care with the patient and/or family: 55 min  Ahmani Prehn, DO [facility]  To contact the attending physician between 7A-7P please use Epic Chat. To contact the covering physician during after hours 7P-7A, please review Amion.  05/10/2024, 4:57 PM   *This document has been created with the assistance of dictation software. Please excuse typographical errors. *   "

## 2024-05-10 NOTE — Plan of Care (Signed)
   Problem: Elimination: Goal: Will not experience complications related to bowel motility Outcome: Progressing

## 2024-05-10 NOTE — Plan of Care (Signed)
   Problem: Education: Goal: Knowledge of General Education information will improve Description: Including pain rating scale, medication(s)/side effects and non-pharmacologic comfort measures Outcome: Progressing   Problem: Clinical Measurements: Goal: Ability to maintain clinical measurements within normal limits will improve Outcome: Progressing Goal: Diagnostic test results will improve Outcome: Progressing

## 2024-05-10 NOTE — Plan of Care (Signed)

## 2024-05-10 NOTE — Consult Note (Signed)
 PHARMACY - ANTICOAGULATION CONSULT NOTE  Pharmacy Consult for warfarin dosing Indication: atrial fibrillation  Allergies[1]  Patient Measurements: Weight: 72.6 kg (160 lb) (From O/V note on 04/18/24)  Vital Signs: Temp: 97.5 F (36.4 C) (01/11 0839) Temp Source: Oral (01/11 0420) BP: 141/93 (01/11 0839) Pulse Rate: 76 (01/11 0839)  Labs: Recent Labs    05/08/24 1024 05/09/24 0518 05/10/24 0501  HGB  --  9.4*  --   HCT  --  29.3*  --   PLT  --  297  --   LABPROT 19.2* 20.5* 22.1*  INR 1.5* 1.7* 1.8*  CREATININE  --  0.80  --     Estimated Creatinine Clearance: 49.6 mL/min (by C-G formula based on SCr of 0.8 mg/dL).   Medical History: Past Medical History:  Diagnosis Date   Anemia    past hx   Arthritis    hands and feet   Chronic kidney disease    COPD (chronic obstructive pulmonary disease) (HCC)    Dementia (HCC)    Dyspnea    Dysrhythmia    Pt reports slow HR and papatations   Heart murmur    past hx   Hypercholesteremia    Hypertension    Rheumatic fever    (x2) in past   Wears dentures    full upper    Medications:  PTA warfarin 4mg  daily (last dose 1/4)  Assessment: Roberta Ross is an 41yoF presenting with concerns for chronic nonhealing wound of right lower extremity. Past medical history notable for CKD 3a, gout, systolic CHF, dementia, hypertension, hyperlipidemia, DVT, A-fib on Coumadin , COPD, chronic kidney disease, and rheumatoid arthritis on prednisone  daily.  1/6 AM: Patient's INR 6.2, supratherapeutic. There are various potential explanations for the supratherapeutic level including age (>65yo), interaction with home allopurinol , interaction with recent clindamycin  prescription from 12/30, and the potential increased bleed risk with the chronic nonhealing wound.   Patient without symptoms and INR not due to a recent procedure. Per hospitalist, plan to hold off of oral vitamin K and check INR in the AM.  Hgb in line with historical  baseline. Plt stable. Albumin wnl.  DDI: allopurinol , prednisone , abx (ceftriaxone /zyvox )  Date  Time   INR    Comment   Dose 1/6     0139   6.2     SUPRAtherapeutic HOLD 1/7     0435   4.8     SUPRAtherapeutic, HOLD                                  Hgb 8.9/PLTs 291 1/8     0419   2.3     therapeutic x1  4 mg 1/9     1024   1.5     SUBtherapeutic   6 mg 1/10   0518   1.7     SUBtherapeutic  5 mg 1/11    0501   1.8    SUBtherapeutic      Goal of Therapy:  INR 2-3 Monitor platelets by anticoagulation protocol: Yes   Plan:  INR SUBtherapeutic but slowly increasing Will order Warfarin 6 mg PO x 1 No concern for bridging right now due to CHadsVasc score of 6 (< 7), no hx of Afib or TIA Will continue to monitor INR daily until stable  Monitor CBC at least every 3 days    Allean Haas PharmD Clinical Pharmacist 05/10/2024            [  1]  Allergies Allergen Reactions   Metoprolol Shortness Of Breath   Penicillins Other (See Comments)    Internal swelling including throat TOLERATES CEFTRIAXONE    Methotrexate And Trimetrexate Nausea Only   Doxycycline Hyclate Rash

## 2024-05-11 DIAGNOSIS — L98493 Non-pressure chronic ulcer of skin of other sites with necrosis of muscle: Secondary | ICD-10-CM | POA: Diagnosis not present

## 2024-05-11 DIAGNOSIS — L089 Local infection of the skin and subcutaneous tissue, unspecified: Secondary | ICD-10-CM | POA: Diagnosis not present

## 2024-05-11 LAB — COMPREHENSIVE METABOLIC PANEL WITH GFR
ALT: 9 U/L (ref 0–44)
AST: 11 U/L — ABNORMAL LOW (ref 15–41)
Albumin: 2.8 g/dL — ABNORMAL LOW (ref 3.5–5.0)
Alkaline Phosphatase: 79 U/L (ref 38–126)
Anion gap: 7 (ref 5–15)
BUN: 17 mg/dL (ref 8–23)
CO2: 24 mmol/L (ref 22–32)
Calcium: 9.9 mg/dL (ref 8.9–10.3)
Chloride: 111 mmol/L (ref 98–111)
Creatinine, Ser: 0.82 mg/dL (ref 0.44–1.00)
GFR, Estimated: >60 mL/min
Glucose, Bld: 107 mg/dL — ABNORMAL HIGH (ref 70–99)
Potassium: 4.1 mmol/L (ref 3.5–5.1)
Sodium: 142 mmol/L (ref 135–145)
Total Bilirubin: 0.2 mg/dL (ref 0.0–1.2)
Total Protein: 5.3 g/dL — ABNORMAL LOW (ref 6.5–8.1)

## 2024-05-11 LAB — CBC
HCT: 31.4 % — ABNORMAL LOW (ref 36.0–46.0)
Hemoglobin: 9.7 g/dL — ABNORMAL LOW (ref 12.0–15.0)
MCH: 29.9 pg (ref 26.0–34.0)
MCHC: 30.9 g/dL (ref 30.0–36.0)
MCV: 96.9 fL (ref 80.0–100.0)
Platelets: 283 K/uL (ref 150–400)
RBC: 3.24 MIL/uL — ABNORMAL LOW (ref 3.87–5.11)
RDW: 14.2 % (ref 11.5–15.5)
WBC: 7.8 K/uL (ref 4.0–10.5)
nRBC: 0 % (ref 0.0–0.2)

## 2024-05-11 LAB — PHOSPHORUS: Phosphorus: 2.7 mg/dL (ref 2.5–4.6)

## 2024-05-11 LAB — PROTIME-INR
INR: 2.2 — ABNORMAL HIGH (ref 0.8–1.2)
Prothrombin Time: 25.8 s — ABNORMAL HIGH (ref 11.4–15.2)

## 2024-05-11 LAB — MAGNESIUM: Magnesium: 2.1 mg/dL (ref 1.7–2.4)

## 2024-05-11 MED ORDER — HYDROCORTISONE ACETATE 25 MG RE SUPP: 25.0000 mg | Freq: Two times a day (BID) | RECTAL | Status: AC | PRN

## 2024-05-11 MED ORDER — WARFARIN SODIUM 4 MG PO TABS
4.0000 mg | ORAL_TABLET | Freq: Once | ORAL | Status: AC
  Administered 2024-05-11: 4 mg via ORAL
  Filled 2024-05-11: qty 1

## 2024-05-11 NOTE — TOC Transition Note (Signed)
 Transition of Care [facility]) - Discharge Note   Patient Details  Name: Roberta Ross MRN: [id number] Date of Birth: Aug 02, 1933  Transition [facility]) CM/SW Contact:  Shasta DELENA Daring, RN Phone Number: 05/11/2024, 2:51 PM   Clinical Narrative:     Patient has discharge orders. Accepted bed at [facility]. Facility notified of discharge. Room number and report number provided to bedside RN.  Authorization fos SNFApproved  [id number]  Dates: 1/12-1/14/2026   Next Review Date: 05/13/2024     RNCM signing off.  Final next level of care: Skilled Nursing Facility Barriers to Discharge: Barriers Resolved   Patient Goals and CMS Choice     Choice offered to / list presented to : Patient, Adult Children      Discharge Placement              Patient chooses bed at: [facility] Patient to be transferred to facility by: [facility] Name of family member notified: [name] Chancy Patient and family notified of of transfer: 05/11/24  Discharge Plan and Services Additional resources added to the After Visit Summary for                                       Social Drivers of Health (SDOH) Interventions SDOH Screenings   Food Insecurity: No Food Insecurity (05/05/2024)  Housing: Low Risk (05/05/2024)  Transportation Needs: No Transportation Needs (05/05/2024)  Utilities: Not At Risk (05/05/2024)  Social Connections: Moderately Isolated (05/05/2024)  Tobacco Use: Medium Risk (05/04/2024)     Readmission Risk Interventions    05/07/2024    2:51 PM  Readmission Risk Prevention Plan  Transportation Screening Complete  Medication Review (RN Care Manager) Complete  PCP or Specialist appointment within 3-5 days of discharge --  Palliative Care Screening Not Applicable  Skilled Nursing Facility Complete

## 2024-05-11 NOTE — Plan of Care (Signed)

## 2024-05-11 NOTE — Consult Note (Signed)
 PHARMACY - ANTICOAGULATION CONSULT NOTE  Pharmacy Consult for warfarin dosing Indication: atrial fibrillation  Allergies[1]  Patient Measurements: Weight: 72.6 kg (160 lb) (From O/V note on 04/18/24)  Vital Signs: Temp: 97.5 F (36.4 C) (01/12 0436) Temp Source: Oral (01/12 0436) BP: 126/77 (01/12 0436) Pulse Rate: 77 (01/12 0436)  Labs: Recent Labs    05/09/24 0518 05/10/24 0501 05/11/24 0424  HGB 9.4*  --  9.7*  HCT 29.3*  --  31.4*  PLT 297  --  283  LABPROT 20.5* 22.1* 25.8*  INR 1.7* 1.8* 2.2*  CREATININE 0.80  --  0.82    Estimated Creatinine Clearance: 48.4 mL/min (by C-G formula based on SCr of 0.82 mg/dL).   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 Past medical history notable for CKD 3a, gout, systolic CHF, dementia, hypertension, hyperlipidemia, DVT, A-fib on Coumadin , COPD, chronic kidney disease, and rheumatoid arthritis on prednisone  daily.  1/6 AM: Patient's INR 6.2, supratherapeutic. There are various potential explanations for the supratherapeutic level including age (>89yo), interaction with home allopurinol , interaction with recent clindamycin  prescription from 12/30, and the potential increased bleed risk with the chronic nonhealing wound.   Patient without symptoms and INR not due to a recent procedure. Per hospitalist, plan to hold off of oral vitamin K and check INR in the AM.  Hgb in line with historical  baseline. Plt stable. Albumin wnl.  DDI: allopurinol , prednisone , abx (ceftriaxone /zyvox )  Date  Time   INR    Comment   Dose 1/6     0139   6.2     SUPRAtherapeutic HOLD 1/7     0435   4.8     SUPRAtherapeutic, HOLD                                  Hgb 8.9/PLTs 291 1/8     0419   2.3     therapeutic x1  4 mg 1/9     1024   1.5     SUBtherapeutic   6 mg 1/10   0518   1.7     SUBtherapeutic  5 mg 1/11    0501   1.8    SUBtherapeutic  6 mg 1/12 0424   2.2    therapeutic x 1   4 mg    Goal of Therapy:  INR 2-3 Monitor platelets by anticoagulation protocol: Yes   Plan:  INR therapeutic x 1 Will order Warfarin 4 mg PO x 1, which is patients home dose  No concern for bridging right now due to CHadsVasc score of 6 (< 7), no hx of Afib or TIA Will continue to monitor INR daily until stable  Monitor CBC at least every 3 days  Ransom Blanch PGY-1 Pharmacy Resident   - [facility]  05/11/2024 7:42 AM             [1]  Allergies Allergen Reactions   Metoprolol Shortness Of Breath   Penicillins Other (See Comments)    Internal swelling including throat TOLERATES CEFTRIAXONE    Methotrexate And Trimetrexate Nausea Only   Doxycycline Hyclate Rash

## 2024-05-11 NOTE — Progress Notes (Signed)
 Physical Therapy Treatment Patient Details Name: Roberta Ross MRN: [id number] DOB: [birth year] Today's Date: 05/11/2024   History of Present Illness Pt is an 89yoF who comes to [facility] on 05/04/24 from [place] [facility] with a poorly healing odorous wound on Right leg. PMH: AF on warfarin, CKD3a, gout, HTN, sCHF, COPD, DVT, RA on daily prednisone . In ED pt went into RVR in the 130s with a drop in her O2 sat to 88%. Pt last seen by our services in Nov 2025 after closed reduction left GHJ dislocation. Most recent FU with ortho on 05/01/24 recs for Rt rTSA given RC tear and risk for recurrent dislocation, pt declines. Pt has a frequent falls history. As of ortho FU on 05/01/24 pt has WBAT ad lib use of RUE.  Per MRI impression subcortical stress fracture of the posterior medial tibial plateau. MD assessment also includes: chronic nonhealing wound right lower extremity, acute on chronic anemia, and generalized deconditioning.    PT Comments  Pt ready and motivated to participate I've been looking for you!  She is assisted to don KR RLE prior to transition to EOB with mod a x 1.  She is able to maintain sitting balance x 10 minutes with no LOB before back pain and general fatigue.  She does some AROM LLE in sitting without LOB.  Overall tolerated sitting well today.  She is limited and unable to lateral scoot.  B hands are significantly affected by RA deformity so she is limited in her ability to push to scoot, hold walker or hold bedrails to assist with transition.  She is motivated to participate and increase mobility.  SNF remains appropriate as she is not at a level of mobility to return to ALF.  She returns to supine with mod a x 1 and max a x 1 to reposition in bed.  Safety on and needs met.   If plan is discharge home, recommend the following: Two people to help with walking and/or transfers;Assistance with cooking/housework;Direct supervision/assist for medications management;Assist for  transportation;Two people to help with bathing/dressing/bathroom   Can travel by private vehicle     No  Equipment Recommendations       Recommendations for Other Services       Precautions / Restrictions Precautions Precautions: Fall Required Braces or Orthoses: Knee Immobilizer - Right Knee Immobilizer - Right: On when out of bed or walking Restrictions Weight Bearing Restrictions Per Provider Order: Yes RUE Weight Bearing Per Provider Order: Weight bearing as tolerated RLE Weight Bearing Per Provider Order: Non weight bearing     Mobility  Bed Mobility Overal bed mobility: Needs Assistance Bed Mobility: Supine to Sit, Sit to Supine     Supine to sit: Mod assist, HOB elevated Sit to supine: Mod assist   General bed mobility comments: most assist for upper body due to limited use of hands due to RA    Transfers                   General transfer comment: deferred due to NWB RLE and inability to maintain    Ambulation/Gait                   Stairs             Wheelchair Mobility     Tilt Bed    Modified Rankin (Stroke Patients Only)       Balance Overall balance assessment: Needs assistance Sitting-balance support: Feet supported Sitting balance-Leahy Scale: Good Sitting  balance - Comments: steady in sitting today x 10 minutes before back pain     Standing balance-Leahy Scale: Zero Standing balance comment: dependant                            Communication Communication Communication: Impaired Factors Affecting Communication: Hearing impaired  Cognition Arousal: Alert Behavior During Therapy: WFL for tasks assessed/performed   PT - Cognitive impairments: No apparent impairments                       PT - Cognition Comments: intact for session Following commands: Intact      Cueing Cueing Techniques: Verbal cues, Visual cues, Tactile cues  Exercises Other Exercises Other Exercises: seated AROM  EOB RLE    General Comments        Pertinent Vitals/Pain Pain Assessment Pain Assessment: Faces Faces Pain Scale: Hurts even more Pain Location: general pain all over - seems more from immobility and tender skin/joints.  RLE/knee seems comfortable. Pain Descriptors / Indicators: Sore    Home Living                          Prior Function            PT Goals (current goals can now be found in the care plan section) Progress towards PT goals: Progressing toward goals    Frequency    Min 2X/week      PT Plan      Co-evaluation              AM-PAC PT 6 Clicks Mobility   Outcome Measure  Help needed turning from your back to your side while in a flat bed without using bedrails?: A Lot Help needed moving from lying on your back to sitting on the side of a flat bed without using bedrails?: A Lot Help needed moving to and from a bed to a chair (including a wheelchair)?: Total Help needed standing up from a chair using your arms (e.g., wheelchair or bedside chair)?: Total Help needed to walk in hospital room?: Total Help needed climbing 3-5 steps with a railing? : Total 6 Click Score: 8    End of Session Equipment Utilized During Treatment: Oxygen Activity Tolerance: Patient limited by fatigue;Patient limited by pain Patient left: in bed;with call bell/phone within reach;with bed alarm set Nurse Communication: Mobility status;Other (comment) PT Visit Diagnosis: Difficulty in walking, not elsewhere classified (R26.2);Muscle weakness (generalized) (M62.81);Pain Pain - Right/Left: Right Pain - part of body: Arm (knee generally comfortable but c/o back pain)     Time: 8864-8844 PT Time Calculation (min) (ACUTE ONLY): 20 min  Charges:    $Therapeutic Activity: 8-22 mins PT General Charges $$ ACUTE PT VISIT: 1 Visit                   Lauraine Gills, PTA 05/11/2024, 12:04 PM

## 2024-05-11 NOTE — TOC Progression Note (Signed)
 Transition of Care [facility]) - Progression Note    Patient Details  Name: Kadee Philyaw MRN: [id number] Date of Birth: 1933-12-20  Transition of [facility]) CM/SW Contact  Shasta DELENA Daring, RN Phone Number: 05/11/2024, 10:27 AM  Clinical Narrative:    RNCM spoke with patient daughter, [name]. Confirmed they want to accept bed at [facility] and rehab.  Notified admissions director at [facility] and informed that patient does request a private room if available. Linked in hub.  Will follow for discharge readiness.     Expected Discharge Plan: Skilled Nursing Facility Barriers to Discharge: Continued Medical Work up               Expected Discharge Plan and Services       Living arrangements for the past 2 months: Assisted Living Facility                                       Social Drivers of Health (SDOH) Interventions SDOH Screenings   Food Insecurity: No Food Insecurity (05/05/2024)  Housing: Low Risk (05/05/2024)  Transportation Needs: No Transportation Needs (05/05/2024)  Utilities: Not At Risk (05/05/2024)  Social Connections: Moderately Isolated (05/05/2024)  Tobacco Use: Medium Risk (05/04/2024)    Readmission Risk Interventions    05/07/2024    2:51 PM  Readmission Risk Prevention Plan  Transportation Screening Complete  Medication Review (RN Care Manager) Complete  PCP or Specialist appointment within 3-5 days of discharge --  Palliative Care Screening Not Applicable  Skilled Nursing Facility Complete

## 2024-05-11 NOTE — Discharge Summary (Signed)
 " DISCHARGE SUMMARY    Roberta Ross FMW:[id number] DOB: 05-Apr-1934 DOA: 05/05/2024  PCP: Lenon Layman ORN, MD  Admit date: 05/05/2024 Discharge date: 05/11/2024   Recommendations for Outpatient Follow-up:  Follow up with PCP in 1-2 weeks to continue chronic condition management Follow-up with orthopedic surgery in 4 to 6 weeks for fracture management Continue outpatient follow-up with wound care Referral to gastroenterology if you would like to pursue colonoscopy if rectal bleeding continues   Hospital Course: Roberta Ross is an 89 year old female with A-fib on warfarin, CKD stage IIIa, gout, hypertension, systolic CHF, COPD, femoral DVT in September 2025, rheumatoid arthritis on daily prednisone , with a chronic nonhealing wound to the right lower leg after an injury in October.  She was admitted due to concerns of infection of this chronic wound and incidentally found to be rapid A-fib during ED workup. Prior to arrival her facility started her on clindamycin  due to foul-smelling wound for 1 week. In the ED she was A-fib RVR in the 130s, O2 dropped to 88%.  She was started on diltiazem . She was started on cefepime  and vancomycin  for wound infection.  Cultures were monitored.  Antibiotic therapy eventually narrowed to ceftriaxone  and linezolid .  She completed this course while admitted.  Stay further complicated by newly found stress fracture of the right tibial plateau.  Patient was placed in knee immobilizer and made nonweightbearing.  Due to this change in her ambulatory status PT/OT recommended for SNF placement.  Is discharging directly to SNF today. On day of discharge I discussed discharge and care plans directly with the patient as well as her daughter, Roberta Ross.  Patient is alert and oriented today and agrees with plan for DC to rehab.   Chronic nonhealing wound right lower extremity Infected hematoma chronic wound - MRI right lower extremity: Collection of  fluid underlying the investing fascia of the lateral anterior posterior compartment measuring 7.2 cm x 1 cm x 10 cm which may represent an infected hematoma given the history of prior trauma. - She was also noted to have subcutaneous edema throughout the calf. - No evidence of acute osteomyelitis on this MRI - Vascular surgery was consulted and recommends Unna boot wrappings to the right lower extremity to help with compression and reduce some swelling.  Operative debriding not recommended at this time. - Charging to SNF.  Continue with every other day Unna boot and dressing changes.   - Cultures revealed Klebsiella sensitive to all except ampicillin, as well as Enterococcus faecalis. - In light of her penicillin allergy antibiotic selection has been difficult.  I have discussed with ID Pharm.  Today finishing 7-day course of ceftriaxone  and linezolid .   - Blood cultures remain negative   Subcortical stress fracture of the posterior medial tibial plateau - Incidentally seen on lower extremity MRI.  Dedicated MRI also reveals complex degenerative tear of posterior horn of medial meniscus and degenerative changes of the posterior horn root and lateral meniscus without a discrete tear..  Regional muscular atrophy suggesting sarcopenia - Discussed with orthopedic surgery who recommends knee immobilizer and nonweightbearing.   - Referral for orthopedic surgery follow-up outpatient   Therapeutic INR - INR returned at 6.2.  No active bleeding appreciated outside of hematoma in leg - Warfarin was titrated during her stay.  INR therapeutic now - Discharge at 4 mg per recommendations of clinical Pharm - Of note, patient was previously on apixaban  but had DVT thought to be apixaban  failure   Bloody bowel movements Hemorrhoids -  Likely secondary to hemorrhoidal bleed. Though patient has never had a colonoscopy - As needed Anusol  cream - Repeat CBC shows rising hemoglobin. - Discussed colonoscopy with  the patient, she is not interested at this time.  Referral placed at discharge if she would like to pursue this later or if bleeding recurs   Paroxysmal A-fib with RVR - Continue with diltiazem  - Warfarin as above   Acute on chronic anemia - Acute worsening secondary to the large hematoma and now bloody bowel movements - Hemoglobin stable  COPD, not currently in exacerbation - Continue home inhalers   Stage IIIa CKD - Kidney function at baseline - Avoid nephrotoxics   Gout - Resume home meds   Rheumatoid arthritis Immunosuppression - On chronic steroid therapy.   Generalized deconditioning - Resides at assisted living facility.  Now nonweightbearing to the left leg and has chronic wound needing daily care.  Discharged to SNF.  Hopefully eventually return to ALF  Body mass index is 25.82 kg/m. - Outpatient follow up for lifestyle modification and risk factor management   Discharge Instructions  Discharge Instructions     Ambulatory referral to Gastroenterology   Complete by: As directed    What is the reason for referral?: Colonoscopy Comment - rectal bleeding   Ambulatory referral to Orthopedic Surgery   Complete by: As directed    Hospital follow up   Call MD for:  difficulty breathing, headache or visual disturbances   Complete by: As directed    Call MD for:  persistant dizziness or light-headedness   Complete by: As directed    Call MD for:  persistant nausea and vomiting   Complete by: As directed    Call MD for:  severe uncontrolled pain   Complete by: As directed    Call MD for:  temperature >100.4   Complete by: As directed    Diet general   Complete by: As directed    Discharge instructions   Complete by: As directed    Follow up with your primary care physician to discuss the medication changes during this admission   Discharge wound care:   Complete by: As directed    RLE: Cleanse with NS, pat dry.  Apply Xeroform to wound bed and cover with  Silicone foam dressing.  Change daily.   Increase activity slowly   Complete by: As directed       Allergies as of 05/11/2024       Reactions   Metoprolol Shortness Of Breath   Penicillins Other (See Comments)   Internal swelling including throat TOLERATES CEFTRIAXONE    Methotrexate And Trimetrexate Nausea Only   Doxycycline Hyclate Rash        Medication List     STOP taking these medications    clindamycin  300 MG capsule Commonly known as: CLEOCIN    oxyCODONE  5 MG immediate release tablet Commonly known as: Oxy IR/ROXICODONE    potassium chloride  10 MEQ tablet Commonly known as: KLOR-CON        TAKE these medications    acetaminophen  650 MG CR tablet Commonly known as: TYLENOL  Take 650 mg by mouth 2 (two) times daily.   acetaminophen  500 MG tablet Commonly known as: TYLENOL  Take 1 tablet (500 mg total) by mouth every 6 (six) hours as needed.   albuterol  108 (90 Base) MCG/ACT inhaler Commonly known as: VENTOLIN  HFA Inhale 2 puffs into the lungs every 4 (four) hours as needed for wheezing or shortness of breath.   allopurinol  100 MG tablet Commonly known  as: ZYLOPRIM  Take 0.5 tablets (50 mg total) by mouth daily.   ammonium lactate 12 % lotion Commonly known as: LAC-HYDRIN Apply 1 Application topically daily.   diclofenac  Sodium 1 % Gel Commonly known as: VOLTAREN  Apply 4 g topically 4 (four) times daily. Both knees   diltiazem  240 MG 24 hr tablet Commonly known as: CARDIZEM  LA Take 240 mg by mouth daily.   folic acid  400 MCG tablet Commonly known as: FOLVITE  Take 400 mcg by mouth in the morning.   hydrocortisone  25 MG suppository Commonly known as: ANUSOL -HC Place 1 suppository (25 mg total) rectally 2 (two) times daily as needed for hemorrhoids or anal itching.   lidocaine  5 % Commonly known as: LIDODERM  Place 1 patch onto the skin daily. Remove & Discard patch within 12 hours or as directed by MD   melatonin 5 MG Tabs Take 1 tablet (5 mg  total) by mouth at bedtime.   polyethylene glycol 17 g packet Commonly known as: MIRALAX  / GLYCOLAX  Take 17 g by mouth daily.   predniSONE  5 MG tablet Commonly known as: DELTASONE  Take 5 mg by mouth daily.   sennosides-docusate sodium  8.6-50 MG tablet Commonly known as: SENOKOT-S Take 2 tablets by mouth 2 (two) times daily.   sertraline  50 MG tablet Commonly known as: ZOLOFT  Take 50 mg by mouth daily.   simvastatin  10 MG tablet Commonly known as: Zocor  Take 1 tablet (10 mg total) by mouth every evening.   torsemide  20 MG tablet Commonly known as: DEMADEX  Take 1 tablet (20 mg total) by mouth every other day.   Vitamin D (Ergocalciferol) 1.25 MG (50000 UNIT) Caps capsule Commonly known as: DRISDOL Take 50,000 Units by mouth once a week.   warfarin 4 MG tablet Commonly known as: COUMADIN  Take 4 mg by mouth daily. What changed: Another medication with the same name was removed. Continue taking this medication, and follow the directions you see here.   Zinc  Oxide 20 % Pste Apply 1 application  topically 3 (three) times daily. Bottom/sacrum               Discharge Care Instructions  (From admission, onward)           Start     Ordered   05/11/24 0000  Discharge wound care:       Comments: RLE: Cleanse with NS, pat dry.  Apply Xeroform to wound bed and cover with Silicone foam dressing.  Change daily.   05/11/24 1432            Contact information for after-discharge care     Destination     [facility] .   Service: Skilled Nursing Contact information: [address] [place]  [address] [phone]                    Allergies[1]  Consultations: Treatment Team:  Tobie Priest, DO   Procedures/Studies: MR KNEE RIGHT WO CONTRAST Result Date: 05/06/2024 EXAM: MRI of the right Knee Without Contrast. 05/06/2024 01:04:52 PM TECHNIQUE: Multiplanar multisequence MRI of the right knee was  performed without intravenous contrast. COMPARISON: MR Right Lower Extremity 05/05/2024. CLINICAL HISTORY: medial tibial plateau lesion on prior calf MRI, for further characterization. FINDINGS: MEDIAL MENISCUS: Complex degenerative tear of the posterior horn medial meniscus with ill definition of most of the posterior horn. Degenerative findings in the root of the posterior horn. LATERAL MENISCUS: Degenerative findings in the root of the posterior horn and lateral meniscus without  definite tear. ANTERIOR CRUCIATE LIGAMENT: There is potential mild ACL degeneration with faint internal signal but no overt tear. POSTERIOR CRUCIATE LIGAMENT: Intact posterior cruciate ligament. EXTENSOR MECHANISM: Intact quadriceps and patellar tendons. Intact patellar retinacula. LATERAL COLLATERAL LIGAMENT COMPLEX: Intact IT band, lateral collateral ligament proper, biceps femoris tendon and popliteus tendon. MEDIAL COLLATERAL LIGAMENT COMPLEX: The superficial and deep components of the medial collateral ligament are intact. KNEE JOINT: Moderate degenerative chondral thinning in the medial compartment and lateral compartment. Small knee joint effusion. BONE MARROW: Subcortical stress fracture posteriorly in the medial tibial plateau with surrounding marrow edema as shown on image 20 of series 12. Regional muscular atrophy suggesting sarcopenia. SOFT TISSUE: Mild distal semimembranosus tendinopathy. Subcutaneous edema posteriorly and laterally in the knee. LIMITATIONS/ARTIFACTS: Motion artifact. IMPRESSION: 1. Subcortical stress fracture of the posterior medial tibial plateau. 2. Complex degenerative tear of the posterior horn medial meniscus. 3. Degenerative changes of the posterior horn root and lateral meniscus without a discrete tear. 4. Moderate degenerative chondral thinning in the medial and lateral compartments. 5. Mild anterior cruciate ligament degeneration without tear. 6. Small knee joint effusion. 7. Mild distal  semimembranosus tendinopathy. 8. Posterolateral knee subcutaneous edema. 9. Regional muscular atrophy suggesting sarcopenia. Electronically signed by: Ryan Salvage MD MD 05/06/2024 03:42 PM EST RP Workstation: [id number]   MR TIBIA FIBULA RIGHT W WO CONTRAST Result Date: 05/05/2024 CLINICAL DATA:  Chronic nonhealing wound of the right lower extremity. History of trauma to the right lower extremity a few months prior. EXAM: MRI OF LOWER RIGHT EXTREMITY WITHOUT AND WITH CONTRAST TECHNIQUE: Multiplanar, multisequence MR imaging of the right leg was performed both before and after administration of intravenous contrast. CONTRAST:  7mL GADAVIST  GADOBUTROL  1 MMOL/ML IV SOLN COMPARISON:  None available. FINDINGS: Bones/Joint/Cartilage No evidence of acute osteomyelitis. Large field-of-view coronal sequences demonstrate subchondral curvilinear low signal abnormality of the right medial tibial plateau with surrounding edema like signal, suspicious for a subchondral insufficiency fracture (series 4, image 14). Muscles and Tendons Generalized atrophy of the anterior and posterior compartment musculature of the calf with patchy nonenhancing increased T2 signal intensity, likely reflecting sequela of denervation changes. No intramuscular collection. Edema and enhancement of the investing fascia overlying the mid to distal peroneal musculature and lateral soleus muscle. Soft tissue Wound extending along the mid to distal lateral calf with complex peripherally enhancing subcutaneous fluid collection demonstrating heterogenous T2 hyperintense and T1 isointense signal with areas of mixed T1 hyperintensity extending to the underlying investing fascia of the lateral anterior and posterior compartment. This collection measures up to 7.2 cm TR x 1 cm AP x 10 cm CC. This may represent an infected hematoma given the history of prior trauma. There is surrounding cutaneous thickening and enhancement. Diffuse subcutaneous edema  extending circumferentially throughout the calf. IMPRESSION: 1. Wound along the mid to distal lateral calf with complex peripherally enhancing subcutaneous fluid collection extending to the underlying investing fascia of the lateral anterior and posterior compartment with associated enhancement. This collection measures up to 7.2 cm TR x 1 cm AP x 10 cm CC and may represent an infected hematoma given the history of prior trauma. Surrounding cutaneous thickening and enhancement with diffuse subcutaneous edema extending throughout the calf, cellulitis is not excluded. 2. No evidence of acute osteomyelitis. 3. Findings suspicious for subchondral insufficiency fracture of the right medial tibial plateau with surrounding marrow edema noted on limited large field-of-view coronal sequences. Recommend correlation with history/exam. Dedicated MRI of the knee could be considered for further evaluation Electronically Signed  By: Harrietta Sherry M.D.   On: 05/05/2024 09:09   CT HEAD WO CONTRAST ( ) Result Date: 04/18/2024 EXAM: CT HEAD WITHOUT CONTRAST 04/18/2024 01:36:00 PM TECHNIQUE: CT of the head was performed without the administration of intravenous contrast. Automated exposure control, iterative reconstruction, and/or weight based adjustment of the mA/kV was utilized to reduce the radiation dose to as low as reasonably achievable. COMPARISON: 10/12/2023 CLINICAL HISTORY: new difficulty walking, ? AMS (altered mental status). FINDINGS: BRAIN AND VENTRICLES: No acute hemorrhage. No evidence of acute infarct. No hydrocephalus. No extra-axial collection. No mass effect or midline shift. Moderate vascular calcifications. Moderate atrophy and white matter changes are stable. Lacunar infarcts are present in the right lentiform nucleus and inferior right cerebellum. ORBITS: Left lens replacement. No acute abnormality. SINUSES: No acute abnormality. SOFT TISSUES AND SKULL: No acute soft tissue abnormality. No skull  fracture. IMPRESSION: 1. No acute intracranial abnormality. 2. Moderate atrophy and white matter changes, stable. 3. Remote lacunar infarcts in the right lentiform nucleus and inferior right cerebellum. Electronically signed by: Lonni Necessary MD 04/18/2024 01:50 PM EST RP Workstation: [id number]   DG Wrist Complete Right Result Date: 04/18/2024 CLINICAL DATA:  Right wrist pain EXAM: RIGHT WRIST - COMPLETE 3+ VIEW COMPARISON:  None available. FINDINGS: The bones are subjectively under mineralized. Radiocarpal and ulnar carpal osteoarthritis with joint space narrowing and spurring. No fracture. Subluxations of the second through fifth metacarpal phalangeal joints, not well assessed on this wrist exam. No obvious bony destructive erosive change. Question of soft tissue edema. IMPRESSION: 1. Radiocarpal and ulnar carpal osteoarthritis. 2. Subluxations of the second through fifth metacarpophalangeal joints, not well assessed on this wrist exam. This is typically seen with inflammatory arthritis such as rheumatoid. 3. No acute osseous abnormality. Electronically Signed   By: Andrea Gasman M.D.   On: 04/18/2024 13:29   DG Shoulder Right Result Date: 04/18/2024 EXAM: 1 VIEW(S) XRAY OF THE RIGHT SHOULDER 04/18/2024 01:15:49 PM COMPARISON: None available. CLINICAL HISTORY: right shoulder pain, hx dislocation FINDINGS: BONES AND JOINTS: High-riding humeral head with narrowing of the acromiohumeral interval suggestive of chronic rotator cuff pathology. No acute fracture or dislocation. . Mild degenerative changes at the acromioclavicular joint. SOFT TISSUES: No abnormal calcifications. Visualized lung is unremarkable. IMPRESSION: 1. High-riding humeral head with narrowing of the acromiohumeral interval, suggestive of chronic rotator cuff pathology. 2. No acute findings. Electronically signed by: Waddell Calk MD 04/18/2024 01:21 PM EST RP Workstation: [id number]   DG Chest 1 View Result Date:  04/18/2024 EXAM: 1 VIEW(S) XRAY OF THE CHEST 04/18/2024 09:31:38 AM COMPARISON: 08/01/2023 CLINICAL HISTORY: AMS (altered mental status) FINDINGS: LUNGS AND PLEURA: Low lung volume. Hazy opacities at lung bases, possibly representing atelectasis and/or consolidation. Mild pulmonary edema, possibly accentuated by low lung volume. Possible trace bilateral pleural effusions. No pneumothorax. HEART AND MEDIASTINUM: Mild cardiomegaly. Aortic arch calcifications. BONES AND SOFT TISSUES: No acute osseous abnormality. IMPRESSION: 1. Low lung volumes with hazy bibasilar opacities, possibly representing atelectasis and/or consolidation. 2. Mild pulmonary edema, possibly accentuated by low lung volumes. 3. Possible trace bilateral pleural effusions. 4. Mild cardiomegaly and aortic arch calcifications. Electronically signed by: Evalene Coho MD 04/18/2024 09:36 AM EST RP Workstation: [id number]      Discharge Exam: Vitals:   05/11/24 0934 05/11/24 1411  BP: 128/80 103/64  Pulse: 86 98  Resp: 16   Temp: 97.9 F (36.6 C) 98 F (36.7 C)  SpO2: 96% 95%   Vitals:   05/11/24 0436 05/11/24 0447 05/11/24 0934 05/11/24 1411  BP:  126/77  128/80 103/64  Pulse: 77  86 98  Resp: 20 20 16    Temp: (!) 97.5 F (36.4 C)  97.9 F (36.6 C) 98 F (36.7 C)  TempSrc: Oral   Oral  SpO2: 97%  96% 95%  Weight:        General exam: Appears calm and comfortable, NAD  Respiratory system: No work of breathing, symmetric chest wall expansion Cardiovascular system: S1 & S2 heard, RRR.  Gastrointestinal system: Abdomen is nondistended, soft and nontender.  Neuro: Alert and oriented. No focal neurological deficits. Extremities: Leg wound freshly bandaged c/d/i.  Hands with chronic arthritic changes   The results of significant diagnostics from this hospitalization (including imaging, microbiology, ancillary and laboratory) are listed below for reference.     Microbiology: Recent Results (from the past 240 hours)   Blood culture (routine x 2)     Status: None   Collection Time: 05/05/24  1:25 AM   Specimen: BLOOD  Result Value Ref Range Status   Specimen Description BLOOD LEFT ANTECUBITAL  Final   Special Requests   Final    BOTTLES DRAWN AEROBIC AND ANAEROBIC Blood Culture results may not be optimal due to an inadequate volume of blood received in culture bottles   Culture   Final    NO GROWTH 5 DAYS Performed at [facility], [address] [place], [place] [address]    Report Status 05/10/2024 FINAL  Final  Blood culture (routine x 2)     Status: None   Collection Time: 05/05/24  1:39 AM   Specimen: BLOOD  Result Value Ref Range Status   Specimen Description BLOOD BLOOD LEFT ARM  Final   Special Requests   Final    BOTTLES DRAWN AEROBIC AND ANAEROBIC Blood Culture results may not be optimal due to an inadequate volume of blood received in culture bottles   Culture   Final    NO GROWTH 5 DAYS Performed at [facility], [address], [place], [place] [address]    Report Status 05/10/2024 FINAL  Final  Aerobic/Anaerobic Culture w Gram Stain (surgical/deep wound)     Status: None   Collection Time: 05/05/24  1:39 AM   Specimen: Leg; Wound  Result Value Ref Range Status   Specimen Description   Final    LEG Performed at [facility], [address], [place], [place] [address]    Special Requests   Final    NONE Performed at [facility], [address] [place], [place] [address]    Gram Stain   Final    FEW WBC PRESENT, PREDOMINANTLY PMN FEW GRAM POSITIVE COCCI    Culture   Final    MODERATE KLEBSIELLA PNEUMONIAE MODERATE ENTEROCOCCUS FAECALIS NO ANAEROBES ISOLATED Performed at [facility], [address], [place], [place] [address]    Report Status 05/10/2024 FINAL  Final   Organism ID, Bacteria KLEBSIELLA PNEUMONIAE  Final   Organism ID, Bacteria ENTEROCOCCUS FAECALIS  Final      Susceptibility   Enterococcus faecalis - MIC*     AMPICILLIN <=2 SENSITIVE Sensitive     VANCOMYCIN  1 SENSITIVE Sensitive     GENTAMICIN SYNERGY SENSITIVE Sensitive     * MODERATE ENTEROCOCCUS FAECALIS   Klebsiella pneumoniae - MIC*    AMPICILLIN RESISTANT Resistant     CEFAZOLIN (NON-URINE) <=1 SENSITIVE Sensitive     CEFEPIME  <=0.12 SENSITIVE Sensitive     ERTAPENEM <=0.12 SENSITIVE Sensitive     CEFTRIAXONE  <=  0.25 SENSITIVE Sensitive     CIPROFLOXACIN <=0.06 SENSITIVE Sensitive     GENTAMICIN <=1 SENSITIVE Sensitive     MEROPENEM <=0.25 SENSITIVE Sensitive     TRIMETH/SULFA <=20 SENSITIVE Sensitive     AMPICILLIN/SULBACTAM 4 SENSITIVE Sensitive     PIP/TAZO Value in next row Sensitive      <=4 SENSITIVEThis is a modified FDA-approved test that has been validated and its performance characteristics determined by the reporting laboratory.  This laboratory is certified under the Clinical Laboratory Improvement Amendments CLIA as qualified to perform high complexity clinical laboratory testing.    * MODERATE KLEBSIELLA PNEUMONIAE  Resp panel by RT-PCR (RSV, Flu A&B, Covid) Anterior Nasal Swab     Status: None   Collection Time: 05/05/24  9:12 AM   Specimen: Anterior Nasal Swab  Result Value Ref Range Status   SARS Coronavirus 2 by RT PCR NEGATIVE NEGATIVE Final    Comment: (NOTE) SARS-CoV-2 target nucleic acids are NOT DETECTED.  The SARS-CoV-2 RNA is generally detectable in upper respiratory specimens during the acute phase of infection. The lowest concentration of SARS-CoV-2 viral copies this assay can detect is 138 copies/mL. A negative result does not preclude SARS-Cov-2 infection and should not be used as the sole basis for treatment or other patient management decisions. A negative result may occur with  improper specimen collection/handling, submission of specimen other than nasopharyngeal swab, presence of viral mutation(s) within the areas targeted by this assay, and inadequate number of viral copies(<138 copies/mL). A  negative result must be combined with clinical observations, patient history, and epidemiological information. The expected result is Negative.  Fact Sheet for Patients:  bloggercourse.com  Fact Sheet for Healthcare Providers:  seriousbroker.it  This test is no t yet approved or cleared by the [place]  FDA and  has been authorized for detection and/or diagnosis of SARS-CoV-2 by FDA under an Emergency Use Authorization (EUA). This EUA will remain  in effect (meaning this test can be used) for the duration of the COVID-19 declaration under Section 564(b)(1) of the Act, 21 U.S.C.section 360bbb-3(b)(1), unless the authorization is terminated  or revoked sooner.       Influenza A by PCR NEGATIVE NEGATIVE Final   Influenza B by PCR NEGATIVE NEGATIVE Final    Comment: (NOTE) The Xpert Xpress SARS-CoV-2/FLU/RSV plus assay is intended as an aid in the diagnosis of influenza from Nasopharyngeal swab specimens and should not be used as a sole basis for treatment. Nasal washings and aspirates are unacceptable for Xpert Xpress SARS-CoV-2/FLU/RSV testing.  Fact Sheet for Patients: bloggercourse.com  Fact Sheet for Healthcare Providers: seriousbroker.it  This test is not yet approved or cleared by the [place]  FDA and has been authorized for detection and/or diagnosis of SARS-CoV-2 by FDA under an Emergency Use Authorization (EUA). This EUA will remain in effect (meaning this test can be used) for the duration of the COVID-19 declaration under Section 564(b)(1) of the Act, 21 U.S.C. section 360bbb-3(b)(1), unless the authorization is terminated or revoked.     Resp Syncytial Virus by PCR NEGATIVE NEGATIVE Final    Comment: (NOTE) Fact Sheet for Patients: bloggercourse.com  Fact Sheet for Healthcare  Providers: seriousbroker.it  This test is not yet approved or cleared by the [place]  FDA and has been authorized for detection and/or diagnosis of SARS-CoV-2 by FDA under an Emergency Use Authorization (EUA). This EUA will remain in effect (meaning this test can be used) for the duration of the COVID-19 declaration under Section 564(b)(1) of the  Act, 21 U.S.C. section 360bbb-3(b)(1), unless the authorization is terminated or revoked.  Performed at [facility], [address] [place], [place] [address]   MRSA Next Gen by PCR, Nasal     Status: None   Collection Time: 05/06/24  4:43 PM   Specimen: Nasal Mucosa; Nasal Swab  Result Value Ref Range Status   MRSA by PCR Next Gen NOT DETECTED NOT DETECTED Final    Comment: (NOTE) The GeneXpert MRSA Assay (FDA approved for NASAL specimens only), is one component of a comprehensive MRSA colonization surveillance program. It is not intended to diagnose MRSA infection nor to guide or monitor treatment for MRSA infections. Test performance is not FDA approved in patients less than 32 years old. Performed at [facility], [address] [place], [place] [address]      Labs: BNP (last 3 results) No results for input(s): BNP in the last 8760 hours. Basic Metabolic Panel: Recent Labs  Lab 05/04/24 1749 05/05/24 0138 05/06/24 0435 05/07/24 0419 05/09/24 0518 05/11/24 0424  NA 139  --  140 140 140 142  K 3.8  --  3.6 3.7 3.8 4.1  CL 100  --  106 108 109 111  CO2 29  --  26 25 24 24   GLUCOSE 131*  --  92 121* 94 107*  BUN 20  --  15 15 13 17   CREATININE 1.00  --  0.97 0.83 0.80 0.82  CALCIUM 10.4*  --  9.3 9.7 10.0 9.9  MG  --  2.2  --   --  2.0 2.1  PHOS  --   --   --   --  2.2* 2.7   Liver Function Tests: Recent Labs  Lab 05/07/24 0419 05/09/24 0518 05/11/24 0424  AST 10* 13* 11*  ALT 6 8 9   ALKPHOS 76 81 79  BILITOT <0.2 0.3 0.2  PROT 5.3* 5.4* 5.3*  ALBUMIN 2.7*  2.8* 2.8*   No results for input(s): LIPASE, AMYLASE in the last 168 hours. No results for input(s): AMMONIA in the last 168 hours. CBC: Recent Labs  Lab 05/04/24 1749 05/06/24 0435 05/07/24 0419 05/09/24 0518 05/11/24 0424  WBC 10.7* 6.6 7.3 8.0 7.8  HGB 11.6* 8.9* 8.8* 9.4* 9.7*  HCT 36.5 28.8* 28.7* 29.3* 31.4*  MCV 95.3 98.0 97.6 94.5 96.9  PLT 382 291 276 297 283   Cardiac Enzymes: No results for input(s): CKTOTAL, CKMB, CKMBINDEX, TROPONINI in the last 168 hours. BNP: Invalid input(s): POCBNP CBG: No results for input(s): GLUCAP in the last 168 hours. D-Dimer No results for input(s): DDIMER in the last 72 hours. Hgb A1c No results for input(s): HGBA1C in the last 72 hours. Lipid Profile No results for input(s): CHOL, HDL, LDLCALC, TRIG, CHOLHDL, LDLDIRECT in the last 72 hours. Thyroid  function studies No results for input(s): TSH, T4TOTAL, T3FREE, THYROIDAB in the last 72 hours.  Invalid input(s): FREET3 Anemia work up No results for input(s): VITAMINB12, FOLATE, FERRITIN, TIBC, IRON, RETICCTPCT in the last 72 hours. Urinalysis    Component Value Date/Time   COLORURINE YELLOW (A) 05/10/2024 1723   APPEARANCEUR CLOUDY (A) 05/10/2024 1723   LABSPEC 1.011 05/10/2024 1723   PHURINE 6.0 05/10/2024 1723   GLUCOSEU NEGATIVE 05/10/2024 1723   HGBUR NEGATIVE 05/10/2024 1723   BILIRUBINUR NEGATIVE 05/10/2024 1723   KETONESUR NEGATIVE 05/10/2024 1723   PROTEINUR NEGATIVE 05/10/2024 1723   NITRITE NEGATIVE 05/10/2024 1723   LEUKOCYTESUR LARGE (A) 05/10/2024 1723   Sepsis Labs Recent Labs  Lab 05/06/24 0435 05/07/24 0419 05/09/24 0518 05/11/24 0424  WBC 6.6 7.3 8.0 7.8   Microbiology Recent Results (from the past 240 hours)  Blood culture (routine x 2)     Status: None   Collection Time: 05/05/24  1:25 AM   Specimen: BLOOD  Result Value Ref Range Status   Specimen Description BLOOD LEFT ANTECUBITAL   Final   Special Requests   Final    BOTTLES DRAWN AEROBIC AND ANAEROBIC Blood Culture results may not be optimal due to an inadequate volume of blood received in culture bottles   Culture   Final    NO GROWTH 5 DAYS Performed at [facility], [address] [place], [place] [address]    Report Status 05/10/2024 FINAL  Final  Blood culture (routine x 2)     Status: None   Collection Time: 05/05/24  1:39 AM   Specimen: BLOOD  Result Value Ref Range Status   Specimen Description BLOOD BLOOD LEFT ARM  Final   Special Requests   Final    BOTTLES DRAWN AEROBIC AND ANAEROBIC Blood Culture results may not be optimal due to an inadequate volume of blood received in culture bottles   Culture   Final    NO GROWTH 5 DAYS Performed at [facility], [address] [place], [place] [address]    Report Status 05/10/2024 FINAL  Final  Aerobic/Anaerobic Culture w Gram Stain (surgical/deep wound)     Status: None   Collection Time: 05/05/24  1:39 AM   Specimen: Leg; Wound  Result Value Ref Range Status   Specimen Description   Final    LEG Performed at [facility], [address] [place], [place] [address]    Special Requests   Final    NONE Performed at [facility], [address] [place], [place] [address]    Gram Stain   Final    FEW WBC PRESENT, PREDOMINANTLY PMN FEW GRAM POSITIVE COCCI    Culture   Final    MODERATE KLEBSIELLA PNEUMONIAE MODERATE ENTEROCOCCUS FAECALIS NO ANAEROBES ISOLATED Performed at [facility], [address], [place], [place] [address]    Report Status 05/10/2024 FINAL  Final   Organism ID, Bacteria KLEBSIELLA PNEUMONIAE  Final   Organism ID, Bacteria ENTEROCOCCUS FAECALIS  Final      Susceptibility   Enterococcus faecalis - MIC*    AMPICILLIN <=2 SENSITIVE Sensitive     VANCOMYCIN  1 SENSITIVE Sensitive     GENTAMICIN SYNERGY SENSITIVE Sensitive     * MODERATE ENTEROCOCCUS FAECALIS   Klebsiella pneumoniae  - MIC*    AMPICILLIN RESISTANT Resistant     CEFAZOLIN (NON-URINE) <=1 SENSITIVE Sensitive     CEFEPIME  <=0.12 SENSITIVE Sensitive     ERTAPENEM <=0.12 SENSITIVE Sensitive     CEFTRIAXONE  <=0.25 SENSITIVE Sensitive     CIPROFLOXACIN <=0.06 SENSITIVE Sensitive     GENTAMICIN <=1 SENSITIVE Sensitive     MEROPENEM <=0.25 SENSITIVE Sensitive     TRIMETH/SULFA <=20 SENSITIVE Sensitive     AMPICILLIN/SULBACTAM 4 SENSITIVE Sensitive     PIP/TAZO Value in next row Sensitive      <=4 SENSITIVEThis is a modified FDA-approved test that has been validated and its performance characteristics determined by the reporting laboratory.  This laboratory is certified under the Clinical Laboratory Improvement Amendments CLIA as qualified to perform high complexity clinical laboratory testing.    * MODERATE KLEBSIELLA PNEUMONIAE  Resp panel by RT-PCR (RSV, Flu A&B, Covid) Anterior  Nasal Swab     Status: None   Collection Time: 05/05/24  9:12 AM   Specimen: Anterior Nasal Swab  Result Value Ref Range Status   SARS Coronavirus 2 by RT PCR NEGATIVE NEGATIVE Final    Comment: (NOTE) SARS-CoV-2 target nucleic acids are NOT DETECTED.  The SARS-CoV-2 RNA is generally detectable in upper respiratory specimens during the acute phase of infection. The lowest concentration of SARS-CoV-2 viral copies this assay can detect is 138 copies/mL. A negative result does not preclude SARS-Cov-2 infection and should not be used as the sole basis for treatment or other patient management decisions. A negative result may occur with  improper specimen collection/handling, submission of specimen other than nasopharyngeal swab, presence of viral mutation(s) within the areas targeted by this assay, and inadequate number of viral copies(<138 copies/mL). A negative result must be combined with clinical observations, patient history, and epidemiological information. The expected result is Negative.  Fact Sheet for Patients:   bloggercourse.com  Fact Sheet for Healthcare Providers:  seriousbroker.it  This test is no t yet approved or cleared by the [place]  FDA and  has been authorized for detection and/or diagnosis of SARS-CoV-2 by FDA under an Emergency Use Authorization (EUA). This EUA will remain  in effect (meaning this test can be used) for the duration of the COVID-19 declaration under Section 564(b)(1) of the Act, 21 U.S.C.section 360bbb-3(b)(1), unless the authorization is terminated  or revoked sooner.       Influenza A by PCR NEGATIVE NEGATIVE Final   Influenza B by PCR NEGATIVE NEGATIVE Final    Comment: (NOTE) The Xpert Xpress SARS-CoV-2/FLU/RSV plus assay is intended as an aid in the diagnosis of influenza from Nasopharyngeal swab specimens and should not be used as a sole basis for treatment. Nasal washings and aspirates are unacceptable for Xpert Xpress SARS-CoV-2/FLU/RSV testing.  Fact Sheet for Patients: bloggercourse.com  Fact Sheet for Healthcare Providers: seriousbroker.it  This test is not yet approved or cleared by the [place]  FDA and has been authorized for detection and/or diagnosis of SARS-CoV-2 by FDA under an Emergency Use Authorization (EUA). This EUA will remain in effect (meaning this test can be used) for the duration of the COVID-19 declaration under Section 564(b)(1) of the Act, 21 U.S.C. section 360bbb-3(b)(1), unless the authorization is terminated or revoked.     Resp Syncytial Virus by PCR NEGATIVE NEGATIVE Final    Comment: (NOTE) Fact Sheet for Patients: bloggercourse.com  Fact Sheet for Healthcare Providers: seriousbroker.it  This test is not yet approved or cleared by the [place]  FDA and has been authorized for detection and/or diagnosis of SARS-CoV-2 by FDA under an Emergency Use  Authorization (EUA). This EUA will remain in effect (meaning this test can be used) for the duration of the COVID-19 declaration under Section 564(b)(1) of the Act, 21 U.S.C. section 360bbb-3(b)(1), unless the authorization is terminated or revoked.  Performed at [facility], [address] [place], [place] [address]   MRSA Next Gen by PCR, Nasal     Status: None   Collection Time: 05/06/24  4:43 PM   Specimen: Nasal Mucosa; Nasal Swab  Result Value Ref Range Status   MRSA by PCR Next Gen NOT DETECTED NOT DETECTED Final    Comment: (NOTE) The GeneXpert MRSA Assay (FDA approved for NASAL specimens only), is one component of a comprehensive MRSA colonization surveillance program. It is not intended to diagnose MRSA infection nor to guide or monitor treatment for MRSA infections. Test performance  is not FDA approved in patients less than 9 years old. Performed at [facility], [address] [place], [place] [address]      Time coordinating discharge: 32 min   SIGNED: Lorane Poland, DO [facility] 05/11/2024, 2:39 PM Pager   If 7PM-7AM, please contact night-coverage     [1]  Allergies Allergen Reactions   Metoprolol Shortness Of Breath   Penicillins Other (See Comments)    Internal swelling including throat TOLERATES CEFTRIAXONE    Methotrexate And Trimetrexate Nausea Only   Doxycycline Hyclate Rash   "

## 2024-06-15 ENCOUNTER — Encounter: Admitting: Physician Assistant
